# Patient Record
Sex: Female | Born: 1960 | Race: White | Hispanic: No | Smoking: Never smoker
Health system: Southern US, Community
[De-identification: ages and names within clinical notes are randomized; demographics above are authoritative.]

## PROBLEM LIST (undated history)

## (undated) DIAGNOSIS — E119 Type 2 diabetes mellitus without complications: Secondary | ICD-10-CM

## (undated) DIAGNOSIS — M67912 Unspecified disorder of synovium and tendon, left shoulder: Secondary | ICD-10-CM

## (undated) DIAGNOSIS — Z9889 Other specified postprocedural states: Secondary | ICD-10-CM

## (undated) DIAGNOSIS — E785 Hyperlipidemia, unspecified: Secondary | ICD-10-CM

## (undated) DIAGNOSIS — Z8489 Family history of other specified conditions: Secondary | ICD-10-CM

## (undated) DIAGNOSIS — H35039 Hypertensive retinopathy, unspecified eye: Secondary | ICD-10-CM

## (undated) DIAGNOSIS — H269 Unspecified cataract: Secondary | ICD-10-CM

## (undated) DIAGNOSIS — E11319 Type 2 diabetes mellitus with unspecified diabetic retinopathy without macular edema: Secondary | ICD-10-CM

## (undated) DIAGNOSIS — I10 Essential (primary) hypertension: Secondary | ICD-10-CM

## (undated) HISTORY — DX: Type 2 diabetes mellitus with unspecified diabetic retinopathy without macular edema: E11.319

## (undated) HISTORY — PX: ANKLE ARTHROSCOPY WITH OPEN REDUCTION INTERNAL FIXATION (ORIF): SHX5582

## (undated) HISTORY — DX: Hypertensive retinopathy, unspecified eye: H35.039

## (undated) HISTORY — DX: Unspecified cataract: H26.9

---

## 2017-11-06 MED FILL — CHLORTHALIDONE 25 MG TAB: 25 | 30 days supply | Qty: 30 | Fill #0

## 2017-11-06 MED FILL — busPIRone HCL 5 MG TABS: 5 | 30 days supply | Qty: 90 | Fill #0

## 2017-11-06 MED FILL — metFORMIN HCL 500 MG TABS: 500 | 30 days supply | Qty: 60 | Fill #0

## 2017-11-06 MED FILL — PRAMIPEXOLE 0.125 MG TABLET: 0.125 | 30 days supply | Qty: 30 | Fill #0

## 2017-11-06 MED FILL — raNITIdine HCL 150 MG TABS: 150 | 30 days supply | Qty: 60 | Fill #0

## 2017-11-06 MED FILL — VIT D2 1.25 MG (50,000 UNIT: 1.25 MG | 28 days supply | Qty: 4 | Fill #0

## 2017-11-06 MED FILL — ATORVASTATIN CALCIUM 40 MG: 40 | 30 days supply | Qty: 30 | Fill #0

## 2017-11-06 MED FILL — CILOSTAZOL 50 MG TABLET: 50 | 30 days supply | Qty: 60 | Fill #0

## 2017-11-06 MED FILL — GABAPENTIN 800 MG TABLET: 800 | 30 days supply | Qty: 90 | Fill #0

## 2017-11-06 MED FILL — METOPROLOL SUCCINATE ER 50: 50 | 30 days supply | Qty: 30 | Fill #0

## 2017-11-06 MED FILL — hydrOXYzine HCL 25 MG TABS: 25 | 30 days supply | Qty: 90 | Fill #0

## 2017-11-06 MED FILL — HUMALOG MIX 75-25 KWIKPEN: (75-25) 100 | 30 days supply | Qty: 3 | Fill #0

## 2017-11-06 MED FILL — PRAZOSIN 1 MG CAPSULE: 1 | 30 days supply | Qty: 30 | Fill #0

## 2017-11-06 MED FILL — lamoTRIgine 100 MG TABS: 100 | 30 days supply | Qty: 60 | Fill #0

## 2017-11-06 MED FILL — traZODone HCL 100 MG TABS: 100 | 30 days supply | Qty: 30 | Fill #0

## 2017-11-22 ENCOUNTER — Emergency Department (HOSPITAL_COMMUNITY): Payer: Medicaid Other

## 2017-11-22 ENCOUNTER — Emergency Department (HOSPITAL_COMMUNITY)
Admission: EM | Admit: 2017-11-22 | Discharge: 2017-11-22 | Disposition: A | Discharge status: Home / Self Care | Payer: Medicaid Other | Attending: Emergency Medicine | Admitting: Emergency Medicine

## 2017-11-22 ENCOUNTER — Encounter (HOSPITAL_COMMUNITY): Payer: Self-pay | Admitting: Nurse Practitioner

## 2017-11-22 DIAGNOSIS — Y939 Activity, unspecified: Secondary | ICD-10-CM | POA: Insufficient documentation

## 2017-11-22 DIAGNOSIS — S46912A Strain of unspecified muscle, fascia and tendon at shoulder and upper arm level, left arm, initial encounter: Secondary | ICD-10-CM | POA: Insufficient documentation

## 2017-11-22 DIAGNOSIS — Y929 Unspecified place or not applicable: Secondary | ICD-10-CM | POA: Diagnosis not present

## 2017-11-22 DIAGNOSIS — W16212A Fall in (into) filled bathtub causing other injury, initial encounter: Secondary | ICD-10-CM | POA: Insufficient documentation

## 2017-11-22 DIAGNOSIS — I10 Essential (primary) hypertension: Secondary | ICD-10-CM | POA: Insufficient documentation

## 2017-11-22 DIAGNOSIS — E119 Type 2 diabetes mellitus without complications: Secondary | ICD-10-CM | POA: Insufficient documentation

## 2017-11-22 DIAGNOSIS — Y999 Unspecified external cause status: Secondary | ICD-10-CM | POA: Insufficient documentation

## 2017-11-22 DIAGNOSIS — S40912A Unspecified superficial injury of left shoulder, initial encounter: Secondary | ICD-10-CM | POA: Diagnosis present

## 2017-11-22 HISTORY — DX: Essential (primary) hypertension: I10

## 2017-11-22 HISTORY — DX: Type 2 diabetes mellitus without complications: E11.9

## 2017-11-22 LAB — CBG MONITORING, ED: Glucose-Capillary: 178 mg/dL — ABNORMAL HIGH (ref 70–99)

## 2017-11-22 MED ORDER — CYCLOBENZAPRINE HCL 10 MG PO TABS: 10.0000 mg | ORAL_TABLET | Freq: Two times a day (BID) | ORAL | 0 refills | Status: DC | PRN

## 2017-11-22 MED ORDER — CYCLOBENZAPRINE HCL 10 MG PO TABS
10.0000 mg | ORAL_TABLET | Freq: Once | ORAL | Status: AC
  Administered 2017-11-22: 10 mg via ORAL
  Filled 2017-11-22: qty 1

## 2017-11-22 NOTE — ED Provider Notes (Signed)
[facility] DEPT Provider Note   CSN: [id number] Arrival date & time: 11/22/17  1554     History   Chief Complaint Chief Complaint  Patient presents with  . Neck Pain  . Shoulder Pain    HPI Jessica Mercado is a 57 y.o. female  With hx of HTN, DM and right ankle fusion who presents to the ED with neck and shoulder pain that started about 4 months ago. Patient reports that she was getting out of the bath tub and slipped and felt a tear in her left shoulder. She did not go to get it checked out. She has recently moved to [place] and the pain has gotten worse and she wanted to get it checked.   HPI  Past Medical History:  Diagnosis Date  . Diabetes mellitus without complication ([place])   . Hypertension     There are no active problems to display for this patient.   History reviewed. No pertinent surgical history.   OB History   None      Home Medications    Prior to Admission medications   Medication Sig Start Date End Date Taking? Authorizing Provider  cyclobenzaprine (FLEXERIL) 10 MG tablet Take 1 tablet (10 mg total) by mouth 2 (two) times daily as needed for muscle spasms. 11/22/17   Ashley Murrain, NP    Family History History reviewed. No pertinent family history.  Social History Social History   Tobacco Use  . Smoking status: Never Smoker  . Smokeless tobacco: Never Used  Substance Use Topics  . Alcohol use: Not Currently  . Drug use: Not Currently     Allergies   Patient has no known allergies.   Review of Systems Review of Systems  Musculoskeletal: Positive for arthralgias.       Left shoulder pain  All other systems reviewed and are negative.    Physical Exam Updated Vital Signs BP 125/78 (BP Location: Right Arm)   Pulse 87   Temp 98 F (36.7 C) (Oral)   Resp 14   LMP  (LMP Unknown)   SpO2 95%   Physical Exam  Constitutional: She appears well-developed and well-nourished. No distress.  HENT:    Head: Normocephalic.  Eyes: EOM are normal.  Neck: Neck supple. Muscular tenderness (left) present. No spinous process tenderness present. Normal range of motion present.  Cardiovascular: Normal rate.  Pulmonary/Chest: Effort normal.  Musculoskeletal:       Left shoulder: She exhibits tenderness and spasm. She exhibits no crepitus, no deformity, no laceration, normal pulse and normal strength. Decreased range of motion: due to pain.  Grips are equal, radial pulses 2+.  Neurological: She is alert.  Skin: Skin is warm and dry.  Psychiatric: She has a normal mood and affect.  Nursing note and vitals reviewed.    ED Treatments / Results  Labs (all labs ordered are listed, but only abnormal results are displayed) Labs Reviewed  CBG MONITORING, ED - Abnormal; Notable for the following components:      Result Value   Glucose-Capillary 178 (*)    All other components within normal limits   Radiology Dg Shoulder Left  Result Date: 11/22/2017 CLINICAL DATA:  Neck and shoulder pain, fell in tub EXAM: LEFT SHOULDER - 2+ VIEW COMPARISON:  None. FINDINGS: No fracture or malalignment. Small calcification adjacent to the humeral head consistent with calcific tendinopathy. AC joint is intact. Surgical changes in the cervical spine. IMPRESSION: 1. No acute osseous abnormality. 2. Calcific  tendinitis. Electronically Signed   By: Donavan Foil M.D.   On: 11/22/2017 19:53    Procedures Procedures (including critical care time)  Medications Ordered in ED Medications  cyclobenzaprine (FLEXERIL) tablet 10 mg (10 mg Oral Given 11/22/17 1937)     Initial Impression / Assessment and Plan / ED Course  I have reviewed the triage vital signs and the nursing notes. 57 y.o. female here with left shoulder pain s/p injury 4 months ago stable for d/c without fracture or dislocation noted on x-ray and no focal neuro deficits. Patient to f/u with ortho. Sling applied.  Final Clinical Impressions(s) / ED  Diagnoses   Final diagnoses:  Shoulder strain, left, initial encounter    ED Discharge Orders        Ordered    cyclobenzaprine (FLEXERIL) 10 MG tablet  2 times daily PRN     11/22/17 2004       Debroah Baller [place], [place] 11/22/17 2009    Julianne Rice, MD 11/24/17 1654

## 2017-11-22 NOTE — Discharge Instructions (Addendum)
Follow up with Dr. Griffin Basil, call for appointment

## 2017-11-22 NOTE — ED Triage Notes (Signed)
Pt is c/o neck and shoulder pain that has been ongoing for about 4 months.

## 2018-01-07 ENCOUNTER — Encounter (HOSPITAL_COMMUNITY): Payer: Self-pay

## 2018-01-07 ENCOUNTER — Emergency Department (HOSPITAL_COMMUNITY): Payer: Medicaid Other

## 2018-01-07 ENCOUNTER — Emergency Department (HOSPITAL_COMMUNITY)
Admission: EM | Admit: 2018-01-07 | Discharge: 2018-01-07 | Disposition: A | Discharge status: Home / Self Care | Payer: Medicaid Other | Attending: Emergency Medicine | Admitting: Emergency Medicine

## 2018-01-07 DIAGNOSIS — Z79899 Other long term (current) drug therapy: Secondary | ICD-10-CM | POA: Insufficient documentation

## 2018-01-07 DIAGNOSIS — R1084 Generalized abdominal pain: Secondary | ICD-10-CM | POA: Insufficient documentation

## 2018-01-07 DIAGNOSIS — Z7984 Long term (current) use of oral hypoglycemic drugs: Secondary | ICD-10-CM | POA: Insufficient documentation

## 2018-01-07 DIAGNOSIS — R109 Unspecified abdominal pain: Secondary | ICD-10-CM | POA: Diagnosis present

## 2018-01-07 DIAGNOSIS — I10 Essential (primary) hypertension: Secondary | ICD-10-CM | POA: Diagnosis not present

## 2018-01-07 DIAGNOSIS — E119 Type 2 diabetes mellitus without complications: Secondary | ICD-10-CM | POA: Insufficient documentation

## 2018-01-07 DIAGNOSIS — K529 Noninfective gastroenteritis and colitis, unspecified: Secondary | ICD-10-CM

## 2018-01-07 LAB — CBC
HCT: 39.2 % (ref 36.0–46.0)
Hemoglobin: 12.4 g/dL (ref 12.0–15.0)
MCH: 25.8 pg — ABNORMAL LOW (ref 26.0–34.0)
MCHC: 31.6 g/dL (ref 30.0–36.0)
MCV: 81.5 fL (ref 78.0–100.0)
Platelets: 252 10*3/uL (ref 150–400)
RBC: 4.81 MIL/uL (ref 3.87–5.11)
RDW: 14.4 % (ref 11.5–15.5)
WBC: 10.7 10*3/uL — ABNORMAL HIGH (ref 4.0–10.5)

## 2018-01-07 LAB — LIPASE, BLOOD: Lipase: 33 U/L (ref 11–51)

## 2018-01-07 LAB — COMPREHENSIVE METABOLIC PANEL
ALT: 15 U/L (ref 0–44)
AST: 18 U/L (ref 15–41)
Albumin: 3.6 g/dL (ref 3.5–5.0)
Alkaline Phosphatase: 98 U/L (ref 38–126)
Anion gap: 11 (ref 5–15)
BUN: 28 mg/dL — ABNORMAL HIGH (ref 6–20)
CO2: 21 mmol/L — ABNORMAL LOW (ref 22–32)
Calcium: 9.7 mg/dL (ref 8.9–10.3)
Chloride: 104 mmol/L (ref 98–111)
Creatinine, Ser: 1.43 mg/dL — ABNORMAL HIGH (ref 0.44–1.00)
GFR calc Af Amer: 47 mL/min — ABNORMAL LOW (ref >60)
GFR calc non Af Amer: 40 mL/min — ABNORMAL LOW (ref >60)
Glucose, Bld: 129 mg/dL — ABNORMAL HIGH (ref 70–99)
Potassium: 4.5 mmol/L (ref 3.5–5.1)
Sodium: 136 mmol/L (ref 135–145)
Total Bilirubin: 0.6 mg/dL (ref 0.3–1.2)
Total Protein: 8.3 g/dL — ABNORMAL HIGH (ref 6.5–8.1)

## 2018-01-07 NOTE — ED Notes (Signed)
No reply x3 for vitals

## 2018-01-07 NOTE — ED Provider Notes (Signed)
Patient placed in Quick Look pathway, seen and evaluated   Chief Complaint: abdominal pain  HPI:   Jessica Mercado is a 57 y.o. female who presents to the ED from her PCP office for evaluation for possible gallbladder problems. Patient reports RUQ abdominal pain that gets worse with eating  ROS: GI: abdominal pain, n/v  Physical Exam:  BP (!) 149/101   Pulse 84   Temp 98.9 F (37.2 C) (Oral)   Resp 18   Ht 5\' 7"  (1.702 m)   Wt 96.2 kg   LMP  (LMP Unknown)   SpO2 97%   BMI 33.20 kg/m    Gen: No distress  Neuro: Awake and Alert  Skin: rash under breast  Abdomen: soft,  Tender with palpation RUQ   Initiation of care has begun. The patient has been counseled on the process, plan, and necessity for staying for the completion/evaluation, and the remainder of the medical screening examination    Ashley Murrain, NP 01/07/18 [place]    Julianne Rice, MD 01/08/18 [phone]

## 2018-01-07 NOTE — ED Triage Notes (Signed)
Pt presents with 1 month h/o RUQ abdominal pain, black tarry stools that are runny with pt unable to control bowel movements.  +vomiting; pt also reports green mucus in stool as well.  Pt also reports red rash under R breast x 1 month; given nystatin topical which helped but after she was out of medication, rash returned.

## 2018-01-07 NOTE — ED Notes (Signed)
No reply x3

## 2018-01-07 NOTE — Discharge Instructions (Addendum)
Follow-up with your family doctor for reevaluation.  Recommend using specimen cup to obtain stool sample as we have discussed and discussed possible GI pathogen panel to evaluate for infectious causes of diarrhea.  Return to the emergency department if symptoms worsen, fevers, unable to eat or drink or other concerning symptoms.  Continue taking home medications as previously prescribed.

## 2018-01-07 NOTE — ED Provider Notes (Signed)
[facility] EMERGENCY DEPARTMENT Provider Note   CSN: [id number] Arrival date & time: 01/07/18  1508     History   Chief Complaint Chief Complaint  Patient presents with  . Abdominal Pain    HPI Jessica Mercado is a 57 y.o. female.  HPI 57 year old female with history of diabetes, hypertension, & CKD presents to the emergency department today for evaluation of abdominal pain and chronic diarrhea.  Patient reports symptoms have been present for the past month.  Recently moved here from [place] and started seeing a new primary care doctor.  Reports she has been having diarrhea that is occasionally black in color.  Often green.  Was having 3 or 4 episodes a day. Started out as watery a month ago. Her PCP started her on a stool bulking agent and has had improvement in symptoms.  States that she has been having occasional right upper quadrant pain that seems to be related to food.  Was advised by her PCP to come to this emergency department for right upper quadrant ultrasound to evaluate for possible biliary etiology/cholecystitis.  Patient denies any fevers or chills.  No blood in stool.  Occasionally dark black.  Not on blood thinners.  States she is tired of having frequent bowel movements.  States her PCP has discussed the likelihood of this being irritable bowel syndrome.  She states she has not had any infectious work-up for her diarrhea that she is aware of.  Not on antibiotics.  Past Medical History:  Diagnosis Date  . Diabetes mellitus without complication ([place])   . Hypertension     There are no active problems to display for this patient.   History reviewed. No pertinent surgical history.   OB History   None      Home Medications    Prior to Admission medications   Medication Sig Start Date End Date Taking? Authorizing Provider  atorvastatin (LIPITOR) 40 MG tablet Take 40 mg by mouth daily. 12/21/17  Yes [provider]  busPIRone (BUSPAR) 5  MG tablet Take 5 mg by mouth 3 (three) times daily. 12/21/17  Yes [provider]  chlorthalidone (HYGROTON) 25 MG tablet Take 25 mg by mouth daily. 12/29/17  Yes [provider]  cholestyramine (QUESTRAN) 4 g packet Take 1 packet by mouth 4 (four) times daily. 12/28/17  Yes [provider]  cilostazol (PLETAL) 50 MG tablet Take 50 mg by mouth 2 (two) times daily. 12/22/17  Yes [provider]  cyclobenzaprine (FLEXERIL) 10 MG tablet Take 1 tablet (10 mg total) by mouth 2 (two) times daily as needed for muscle spasms. 11/22/17  Yes Neese, [place] M, NP  dicyclomine (BENTYL) 20 MG tablet Take 20 mg by mouth 4 (four) times daily. 12/27/17  Yes [provider]  gabapentin (NEURONTIN) 800 MG tablet Take 800 mg by mouth 3 (three) times daily. 12/21/17  Yes [provider]  HUMALOG MIX 75/25 KWIKPEN (75-25) 100 UNIT/ML Kwikpen Inject 45 Units into the skin 2 (two) times daily. 12/21/17  Yes [provider]  hydrOXYzine (ATARAX/VISTARIL) 25 MG tablet Take 25 mg by mouth 3 (three) times daily. 11/06/17  Yes [provider]  LINZESS 145 MCG CAPS capsule Take 145 mcg by mouth daily. 12/27/17  Yes [provider]  metFORMIN (GLUCOPHAGE) 500 MG tablet Take 500 mg by mouth 2 (two) times daily. 12/21/17  Yes [provider]  metoprolol succinate (TOPROL-XL) 50 MG 24 hr tablet Take 50 mg by mouth daily.  12/21/17  Yes [provider]  OZEMPIC 0.25 or 0.5 MG/DOSE SOPN Inject 0.25 mg into the skin every Tuesday. 01/04/18  Yes [provider]  pramipexole (MIRAPEX) 0.125 MG tablet Take 0.125 mg by mouth every evening. 12/21/17  Yes [provider]  prazosin (MINIPRESS) 1 MG capsule Take 1 mg by mouth every evening. 12/22/17  Yes [provider]  ranitidine (ZANTAC) 150 MG capsule Take 150 mg by mouth 2 (two) times daily. 01/02/18  Yes [provider]  traZODone (DESYREL) 100 MG tablet Take 100 mg by mouth at  bedtime. 12/21/17  Yes [provider]  Vitamin D, Ergocalciferol, (DRISDOL) 50000 units CAPS capsule Take 50,000 Units by mouth every Friday. 11/06/17  Yes [provider]    Family History History reviewed. No pertinent family history.  Social History Social History   Tobacco Use  . Smoking status: Never Smoker  . Smokeless tobacco: Never Used  Substance Use Topics  . Alcohol use: Not Currently  . Drug use: Not Currently     Allergies   Vancomycin   Review of Systems Review of Systems  Constitutional: Negative for chills and fever.  HENT: Negative for congestion and sore throat.   Eyes: Negative for visual disturbance.  Respiratory: Negative for cough and shortness of breath.   Cardiovascular: Negative for chest pain and leg swelling.  Gastrointestinal: Positive for abdominal pain and diarrhea. Negative for nausea and vomiting.       Bloating, gaseous  Genitourinary: Negative for dysuria and hematuria.  Musculoskeletal: Negative for back pain and neck pain.  Skin: Negative for color change and rash.  Neurological: Negative for weakness and headaches.  All other systems reviewed and are negative.    Physical Exam Updated Vital Signs BP (!) 180/94 (BP Location: Left Arm)   Pulse 80   Temp 98.2 F (36.8 C) (Oral)   Resp 20   Ht 5\' 7"  (1.702 m)   Wt 96.2 kg   LMP  (LMP Unknown)   SpO2 99%   BMI 33.20 kg/m   Physical Exam  Constitutional: She appears well-developed and well-nourished. No distress.  HENT:  Head: Normocephalic and atraumatic.  Eyes: Conjunctivae are normal.  Neck: Neck supple.  Cardiovascular: Regular rhythm and intact distal pulses.  Pulmonary/Chest: Effort normal and breath sounds normal. No respiratory distress.  Abdominal: Soft. She exhibits no distension. There is no tenderness. There is no rigidity and no CVA tenderness. Murphy's sign: minimal tenderness.  RLE chronic deformity. No asymetric edema  Genitourinary: Rectal  exam shows guaiac negative stool.  Musculoskeletal: She exhibits no edema.  Neurological: She is alert.  Skin: Skin is warm and dry.  Psychiatric: She has a normal mood and affect.  Nursing note and vitals reviewed.   ED Treatments / Results  Labs (all labs ordered are listed, but only abnormal results are displayed) Labs Reviewed  COMPREHENSIVE METABOLIC PANEL - Abnormal; Notable for the following components:      Result Value   CO2 21 (*)    Glucose, Bld 129 (*)    BUN 28 (*)    Creatinine, Ser 1.43 (*)    Total Protein 8.3 (*)    GFR calc non Af Amer 40 (*)    GFR calc Af Amer 47 (*)    All other components within normal limits  CBC - Abnormal; Notable for the following components:   WBC 10.7 (*)    MCH 25.8 (*)    All other components within normal limits  LIPASE, BLOOD  URINALYSIS, ROUTINE W REFLEX MICROSCOPIC    EKG None  Radiology US Abdomen Complete  Result Date: 01/07/2018 CLINICAL DATA:  Right upper quadrant pain diarrhea and vomiting EXAM: ABDOMEN ULTRASOUND COMPLETE COMPARISON:  None. FINDINGS: Gallbladder: No gallstones or wall thickening visualized. Negative sonographic Murphy sign noted by sonographer. Common bile duct: Diameter: 4 mm Liver: No focal lesion identified. Within normal limits in parenchymal echogenicity. Portal vein is patent on color Doppler imaging with normal direction of blood flow towards the liver. IVC: No abnormality visualized. Pancreas: Visualized portion unremarkable. Spleen: Size and appearance within normal limits. Right Kidney: Length: 9.4 cm. Echogenicity within normal limits. No mass or hydronephrosis visualized. Left Kidney: Length: 12.4 cm. Echogenicity within normal limits. No mass or hydronephrosis visualized. Renal cyst measures 1.5 x 1.9 cm. Abdominal aorta: No aneurysm visualized. Other findings: None. IMPRESSION: Unremarkable abdominal ultrasound.  No acute findings. Electronically Signed   By: Ulyses Jarred M.D.   On: 01/07/2018  20:15    Procedures Procedures (including critical care time)  Medications Ordered in ED Medications - No data to display   Initial Impression / Assessment and Plan / ED Course  I have reviewed the triage vital signs and the nursing notes.  Pertinent labs & imaging results that were available during my care of the patient were reviewed by me and considered in my medical decision making (see chart for details).    57 year old female with history of diabetes, hypertension, & CKD presents to the emergency department today for evaluation of abdominal pain and chronic diarrhea.  Patient afebrile, hypertensive at presentation.  Well-appearing.  No distress.  Reassuring abdominal exam.  No urinary symptoms to suggest UTI/pyelonephritis.  CMP is largely unremarkable with no elevation in liver enzymes or bilirubin.  Lipase wnl. She does have WBC of 10.7 with stable hemoglobin despite reporting occasional dark stools.  No signs or symptoms of anemia.  Discussed patient's renal function with her she states she has CKD.  Does not appear dehydrated clinically at this time.  Advised to drink fluids and follow-up with her PCP for recheck of renal function.  Abdominal ultrasound obtained that shows no evidence of cholecystitis or cholelithiasis.  Abdominal aorta with no aneurysm.  No acute findings in the abdomen on complete abdominal ultrasound.  No recent antibiotics. Doubt Cdiff. Advised PCP f/u for GI path panel as unable to provide stool sample in ED. Given patient has continued to tolerate p.o, no vomiting, unremarkable work-up, well appearing, and chronic symptoms pt stable for discharge. Supportive measures discussed. Stable at DC.   Case and plan of care discussed with Dr. Zenia Resides.   Final Clinical Impressions(s) / ED Diagnoses   Final diagnoses:  Chronic diarrhea  Generalized abdominal pain    ED Discharge Orders    None       Corrie Dandy, MD 01/07/18 0086    Lacretia Leigh,  MD 01/11/18 2348

## 2018-01-07 NOTE — ED Provider Notes (Signed)
I saw and evaluated the patient, reviewed the resident's note and I agree with the findings and plan.  EKG: None 57 year old female presents with diarrhea x1 month.  Also concern for possible gallbladder pathology.  Work-up here came back okay.  Her abdominal exam is benign.  She has an appointment to see her doctor tomorrow for follow-up   Lacretia Leigh, MD 01/07/18 2211

## 2018-04-09 ENCOUNTER — Other Ambulatory Visit: Payer: Self-pay | Admitting: Neurological Surgery

## 2018-04-09 DIAGNOSIS — M542 Cervicalgia: Secondary | ICD-10-CM

## 2018-04-26 ENCOUNTER — Ambulatory Visit
Admission: RE | Admit: 2018-04-26 | Discharge: 2018-04-26 | Disposition: A | Discharge status: Home / Self Care | Payer: Medicaid Other | Source: Ambulatory Visit | Attending: Neurological Surgery | Admitting: Neurological Surgery

## 2018-04-26 DIAGNOSIS — M542 Cervicalgia: Secondary | ICD-10-CM

## 2018-06-04 ENCOUNTER — Other Ambulatory Visit: Payer: Self-pay | Admitting: Neurological Surgery

## 2018-06-04 DIAGNOSIS — M542 Cervicalgia: Secondary | ICD-10-CM

## 2018-06-14 ENCOUNTER — Other Ambulatory Visit: Payer: Medicaid Other

## 2018-06-17 ENCOUNTER — Ambulatory Visit
Admission: RE | Admit: 2018-06-17 | Discharge: 2018-06-17 | Disposition: A | Discharge status: Home / Self Care | Payer: Medicaid Other | Source: Ambulatory Visit | Attending: Neurological Surgery | Admitting: Neurological Surgery

## 2018-06-17 DIAGNOSIS — M542 Cervicalgia: Secondary | ICD-10-CM

## 2018-06-17 MED ORDER — DEXAMETHASONE SODIUM PHOSPHATE 4 MG/ML IJ SOLN
9.0000 mg | Freq: Once | INTRAMUSCULAR | Status: AC
  Administered 2018-06-17: 9.2 mg via INTRA_ARTICULAR

## 2018-06-17 NOTE — Discharge Instructions (Signed)

## 2018-08-06 ENCOUNTER — Other Ambulatory Visit: Payer: Self-pay | Admitting: Family Medicine

## 2018-08-06 DIAGNOSIS — R52 Pain, unspecified: Secondary | ICD-10-CM

## 2018-08-06 DIAGNOSIS — R609 Edema, unspecified: Secondary | ICD-10-CM

## 2018-08-11 ENCOUNTER — Other Ambulatory Visit: Payer: Medicaid Other

## 2018-09-10 ENCOUNTER — Encounter: Payer: Medicaid Other | Admitting: Obstetrics & Gynecology

## 2018-10-03 ENCOUNTER — Other Ambulatory Visit: Payer: Self-pay

## 2018-10-03 ENCOUNTER — Ambulatory Visit (HOSPITAL_COMMUNITY)
Admission: EM | Admit: 2018-10-03 | Discharge: 2018-10-03 | Disposition: A | Discharge status: Home / Self Care | Payer: No Typology Code available for payment source | Source: Ambulatory Visit | Attending: Emergency Medicine | Admitting: Emergency Medicine

## 2018-10-03 ENCOUNTER — Emergency Department (HOSPITAL_BASED_OUTPATIENT_CLINIC_OR_DEPARTMENT_OTHER)
Admission: EM | Admit: 2018-10-03 | Discharge: 2018-10-03 | Disposition: A | Discharge status: Home / Self Care | Payer: Medicaid Other | Attending: Emergency Medicine | Admitting: Emergency Medicine

## 2018-10-03 ENCOUNTER — Encounter (HOSPITAL_BASED_OUTPATIENT_CLINIC_OR_DEPARTMENT_OTHER): Payer: Self-pay | Admitting: Emergency Medicine

## 2018-10-03 DIAGNOSIS — I10 Essential (primary) hypertension: Secondary | ICD-10-CM | POA: Diagnosis not present

## 2018-10-03 DIAGNOSIS — E119 Type 2 diabetes mellitus without complications: Secondary | ICD-10-CM | POA: Diagnosis not present

## 2018-10-03 DIAGNOSIS — T7421XA Adult sexual abuse, confirmed, initial encounter: Secondary | ICD-10-CM | POA: Diagnosis not present

## 2018-10-03 DIAGNOSIS — Z0441 Encounter for examination and observation following alleged adult rape: Secondary | ICD-10-CM | POA: Insufficient documentation

## 2018-10-03 DIAGNOSIS — Z79899 Other long term (current) drug therapy: Secondary | ICD-10-CM | POA: Insufficient documentation

## 2018-10-03 DIAGNOSIS — Z794 Long term (current) use of insulin: Secondary | ICD-10-CM | POA: Diagnosis not present

## 2018-10-03 DIAGNOSIS — T7621XA Adult sexual abuse, suspected, initial encounter: Secondary | ICD-10-CM | POA: Diagnosis present

## 2018-10-03 LAB — RAPID HIV SCREEN (HIV 1/2 AB+AG)
HIV 1/2 Antibodies: NONREACTIVE
HIV-1 P24 Antigen - HIV24: NONREACTIVE

## 2018-10-03 MED ORDER — LIDOCAINE HCL (PF) 1 % IJ SOLN
0.9000 mL | Freq: Once | INTRAMUSCULAR | Status: AC
  Administered 2018-10-03: 0.9 mL
  Filled 2018-10-03: qty 5

## 2018-10-03 MED ORDER — AZITHROMYCIN 250 MG PO TABS
1000.0000 mg | ORAL_TABLET | Freq: Once | ORAL | Status: AC
  Administered 2018-10-03: 1000 mg via ORAL
  Filled 2018-10-03: qty 4

## 2018-10-03 MED ORDER — CEFTRIAXONE SODIUM 250 MG IJ SOLR
250.0000 mg | Freq: Once | INTRAMUSCULAR | Status: AC
  Administered 2018-10-03: 09:00:00 250 mg via INTRAMUSCULAR
  Filled 2018-10-03: qty 250

## 2018-10-03 MED ORDER — METRONIDAZOLE 500 MG PO TABS
2000.0000 mg | ORAL_TABLET | Freq: Once | ORAL | Status: AC
  Administered 2018-10-03: 2000 mg via ORAL
  Filled 2018-10-03: qty 4

## 2018-10-03 MED ORDER — PROMETHAZINE HCL 25 MG PO TABS
25.0000 mg | ORAL_TABLET | Freq: Four times a day (QID) | ORAL | Status: DC | PRN
  Filled 2018-10-03: qty 1
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
## 2018-10-03 NOTE — ED Notes (Signed)
ED Provider at bedside. 
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
## 2018-10-03 NOTE — ED Notes (Signed)
Amy RN at bedside for SANE exam

## 2018-10-03 NOTE — ED Notes (Signed)
SANE Exam, still in process
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
## 2018-10-03 NOTE — ED Notes (Signed)
Amy , SANE RN in to see
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
## 2018-10-11 ENCOUNTER — Ambulatory Visit: Payer: Medicaid Other

## 2018-11-24 ENCOUNTER — Other Ambulatory Visit: Payer: Self-pay | Admitting: Family Medicine

## 2018-11-24 DIAGNOSIS — M79605 Pain in left leg: Secondary | ICD-10-CM

## 2018-11-30 ENCOUNTER — Other Ambulatory Visit: Payer: Self-pay | Admitting: Family Medicine

## 2018-12-03 ENCOUNTER — Other Ambulatory Visit: Payer: Medicaid Other

## 2018-12-23 ENCOUNTER — Other Ambulatory Visit: Payer: Self-pay | Admitting: Family Medicine

## 2018-12-23 DIAGNOSIS — Z1231 Encounter for screening mammogram for malignant neoplasm of breast: Secondary | ICD-10-CM

## 2019-01-05 ENCOUNTER — Ambulatory Visit: Payer: Medicaid Other

## 2019-01-27 ENCOUNTER — Emergency Department (HOSPITAL_COMMUNITY)
Admission: EM | Admit: 2019-01-27 | Discharge: 2019-01-27 | Disposition: A | Discharge status: Home / Self Care | Payer: Medicaid Other | Attending: Emergency Medicine | Admitting: Emergency Medicine

## 2019-01-27 ENCOUNTER — Other Ambulatory Visit: Payer: Self-pay

## 2019-01-27 ENCOUNTER — Emergency Department (HOSPITAL_COMMUNITY): Payer: Medicaid Other

## 2019-01-27 DIAGNOSIS — E119 Type 2 diabetes mellitus without complications: Secondary | ICD-10-CM | POA: Diagnosis not present

## 2019-01-27 DIAGNOSIS — Y999 Unspecified external cause status: Secondary | ICD-10-CM | POA: Diagnosis not present

## 2019-01-27 DIAGNOSIS — Y929 Unspecified place or not applicable: Secondary | ICD-10-CM | POA: Diagnosis not present

## 2019-01-27 DIAGNOSIS — Y939 Activity, unspecified: Secondary | ICD-10-CM | POA: Diagnosis not present

## 2019-01-27 DIAGNOSIS — W010XXA Fall on same level from slipping, tripping and stumbling without subsequent striking against object, initial encounter: Secondary | ICD-10-CM | POA: Diagnosis not present

## 2019-01-27 DIAGNOSIS — Z7984 Long term (current) use of oral hypoglycemic drugs: Secondary | ICD-10-CM | POA: Insufficient documentation

## 2019-01-27 DIAGNOSIS — I10 Essential (primary) hypertension: Secondary | ICD-10-CM | POA: Diagnosis not present

## 2019-01-27 DIAGNOSIS — M25512 Pain in left shoulder: Secondary | ICD-10-CM

## 2019-01-27 DIAGNOSIS — Z79899 Other long term (current) drug therapy: Secondary | ICD-10-CM | POA: Insufficient documentation

## 2019-01-27 MED ORDER — TRAMADOL HCL 50 MG PO TABS: 100.0000 mg | ORAL_TABLET | Freq: Once | ORAL | Status: DC

## 2019-01-27 MED ORDER — TRAMADOL HCL 50 MG PO TABS
100.0000 mg | ORAL_TABLET | Freq: Once | ORAL | Status: DC
  Administered 2019-01-27: 20:00:00 100 mg via ORAL
  Filled 2019-01-27: qty 2

## 2019-01-27 MED ORDER — CYCLOBENZAPRINE HCL 10 MG PO TABS: 10.0000 mg | ORAL_TABLET | Freq: Once | ORAL | Status: DC

## 2019-01-27 MED ORDER — HYDROCODONE-ACETAMINOPHEN 5-325 MG PO TABS: 1.0000 | ORAL_TABLET | Freq: Four times a day (QID) | ORAL | 0 refills | Status: DC | PRN

## 2019-01-27 MED ORDER — IBUPROFEN 400 MG PO TABS
600.0000 mg | ORAL_TABLET | Freq: Once | ORAL | Status: DC
  Administered 2019-01-27: 600 mg via ORAL
  Filled 2019-01-27: qty 1

## 2019-01-27 NOTE — ED Provider Notes (Signed)
[facility] Provider Note   CSN: [id number] Arrival date & time: 01/27/19  1537     History   Chief Complaint Chief Complaint  Patient presents with  . Fall  . Dizziness    HPI Jessica Mercado is a 58 y.o. female. Is a 58 year old female with diabetes and hypertension presenting today after a fall that occurred at 1230 this afternoon.  Patient states that her left shoulder hurts.  Patient states she was hit by a car door that was open behind her.  Patient states this pushed her to the ground where she hurt her shoulder, and scraped her knees and face.  Patient denies loss of consciousness, dizziness, or palpitations.      HPI  Past Medical History:  Diagnosis Date  . Diabetes mellitus without complication ([place])   . Hypertension     There are no active problems to display for this patient.   Past Surgical History:  Procedure Laterality Date  . ANKLE ARTHROSCOPY WITH OPEN REDUCTION INTERNAL FIXATION (ORIF)       OB History   No obstetric history on file.      Home Medications    Prior to Admission medications   Medication Sig Start Date End Date Taking? Authorizing Provider  atorvastatin (LIPITOR) 40 MG tablet Take 40 mg by mouth daily. 12/21/17   [provider]  busPIRone (BUSPAR) 5 MG tablet Take 5 mg by mouth 3 (three) times daily. 12/21/17   [provider]  chlorthalidone (HYGROTON) 25 MG tablet Take 25 mg by mouth daily. 12/29/17   [provider]  cholestyramine (QUESTRAN) 4 g packet Take 1 packet by mouth 4 (four) times daily. 12/28/17   [provider]  cilostazol (PLETAL) 50 MG tablet Take 50 mg by mouth 2 (two) times daily. 12/22/17   [provider]  cyclobenzaprine (FLEXERIL) 10 MG tablet Take 1 tablet (10 mg total) by mouth 2 (two) times daily as needed for muscle spasms. 11/22/17   Ashley Murrain, NP  dicyclomine (BENTYL) 20 MG tablet Take 20 mg by mouth 4 (four) times daily.  12/27/17   [provider]  FLUoxetine (PROZAC) 40 MG capsule Take 40 mg by mouth every morning. 09/04/18   [provider]  gabapentin (NEURONTIN) 800 MG tablet Take 800 mg by mouth 3 (three) times daily. 12/21/17   [provider]  glipiZIDE (GLUCOTROL) 5 MG tablet TAKE 1 TABLET BY MOUTH TWICE A DAY BEFORE MEALS 09/04/18   [provider]  HUMALOG MIX 75/25 KWIKPEN (75-25) 100 UNIT/ML Kwikpen Inject 45 Units into the skin 2 (two) times daily. 12/21/17   [provider]  hydrOXYzine (ATARAX/VISTARIL) 25 MG tablet Take 25 mg by mouth 3 (three) times daily. 11/06/17   [provider]  lamoTRIgine (LAMICTAL) 100 MG tablet Take 100 mg by mouth 2 (two) times daily. 09/21/18   [provider]  LINZESS 145 MCG CAPS capsule Take 145 mcg by mouth daily. 12/27/17   [provider]  metFORMIN (GLUCOPHAGE) 500 MG tablet Take 500 mg by mouth 2 (two) times daily. 12/21/17   [provider]  metoprolol succinate (TOPROL-XL) 50 MG 24 hr tablet Take 50 mg by mouth daily. 12/21/17   [provider]  OZEMPIC 0.25 or 0.5 MG/DOSE SOPN Inject 0.25 mg into the skin every Tuesday. 01/04/18   [provider]  pantoprazole (PROTONIX) 40 MG tablet Take 40 mg by mouth daily. 07/21/18   [provider]  pramipexole (MIRAPEX) 0.125 MG tablet Take 0.125 mg by mouth every evening. 12/21/17   [provider]  prazosin (MINIPRESS) 1 MG capsule Take 1 mg by mouth every evening. 12/22/17   [provider]  ranitidine (ZANTAC) 150 MG capsule Take 150 mg by mouth 2 (two) times daily. 01/02/18   [provider]  torsemide (DEMADEX) 20 MG tablet Take 20 mg by mouth 2 (two) times daily. 09/04/18   [provider]  traZODone (DESYREL) 100 MG tablet Take 100 mg by mouth at bedtime. 12/21/17   [provider]  Vitamin D, Ergocalciferol, (DRISDOL) 50000 units CAPS capsule Take 50,000 Units by mouth every  Friday. 11/06/17   [provider]    Family History No family history on file.  Social History Social History   Tobacco Use  . Smoking status: Never Smoker  . Smokeless tobacco: Never Used  Substance Use Topics  . Alcohol use: Not Currently  . Drug use: Not Currently     Allergies   Vancomycin   Review of Systems Review of Systems  Constitutional: Negative for chills and fever.  HENT: Negative for congestion, hearing loss, sinus pressure and sore throat.   Eyes: Negative for pain and redness.  Respiratory: Negative for cough and shortness of breath.   Cardiovascular: Negative for chest pain and palpitations.  Gastrointestinal: Negative for abdominal pain, diarrhea, nausea and vomiting.  Genitourinary: Negative for dysuria and hematuria.  Musculoskeletal: Negative for myalgias.  Skin: Negative for rash.  Neurological: Negative for dizziness and headaches.     Physical Exam Updated Vital Signs BP 111/70 (BP Location: Right Arm)   Pulse 86   Temp 99.3 F (37.4 C) (Oral)   Resp 16   LMP  (LMP Unknown)   SpO2 94%   Physical Exam Constitutional:      General: She is not in acute distress. HENT:     Head: Normocephalic.     Comments: Small, superficial abrasions over the mid forehead and between eyebrows.    Nose: Nose normal.  Eyes:     General:        Right eye: No discharge.        Left eye: No discharge.     Extraocular Movements: Extraocular movements intact.  Neck:     Musculoskeletal: Normal range of motion.  Cardiovascular:     Rate and Rhythm: Normal rate and regular rhythm.     Heart sounds: No murmur. No friction rub. No gallop.   Pulmonary:     Effort: Pulmonary effort is normal. No respiratory distress.     Breath sounds: No stridor. No wheezing or rhonchi.  Abdominal:     General: Bowel sounds are normal.     Palpations: Abdomen is soft.     Tenderness: There is no abdominal tenderness. There is no guarding.  Musculoskeletal:      Right lower leg: No edema.     Left lower leg: No edema.     Comments: Patient is exquisitely tender over the left deltoid and anterior shoulder.  Patient has passive range of motion 90 degrees but is limited secondary to pain.  Active range of motion is limited entirely by pain.  Patient is holding left arm in lap.   Full ROM of knees and hips, neck. Ankle ROM is limited due to old surgery.  No tenderness over the skull, right shoulder, hips, spine, elbows, knees, or feet.   Skin:    General: Skin is warm and dry.  Comments: Abrasions superficial over left and right knee.  Neurological:     Mental Status: She is alert and oriented to person, place, and time. Mental status is at baseline.     Sensory: No sensory deficit.  Psychiatric:        Mood and Affect: Mood normal.        Behavior: Behavior normal.      ED Treatments / Results  Labs (all labs ordered are listed, but only abnormal results are displayed) Labs Reviewed - No data to display  EKG None  Radiology Dg Shoulder Left  Result Date: 01/27/2019 CLINICAL DATA:  58 year old who fell earlier today and injured the LEFT shoulder. Initial encounter. EXAM: LEFT SHOULDER - 2+ VIEW COMPARISON:  11/22/2017. FINDINGS: No evidence of acute fracture. Glenohumeral joint anatomically aligned with a well-preserved joint space. Chronic irregularity involving the INFERIOR glenoid is unchanged. Acromioclavicular joint anatomically aligned without degenerative changes. Subacromial space well-preserved. Calcific supraspinatus tendinitis present on the prior examination is no longer visible. IMPRESSION: No acute osseous abnormality. Electronically Signed   By: Evangeline Dakin M.D.   On: 01/27/2019 16:26    Procedures Procedures (including critical care time)  Medications Ordered in ED Medications - No data to display   Initial Impression / Assessment and Plan / ED Course  I have reviewed the triage vital signs and the nursing notes.   Pertinent labs & imaging results that were available during my care of the patient were reviewed by me and considered in my medical decision making (see chart for details).    Patient presents for left shoulder pain after falling to the ground at 1230 today.  Shoulder x-ray shows chronic calcification of the biceps tendon and no acute fractures or dislocation.  Patient will be placed in a shoulder sling given pain medication, advised on shoulder stretches, and discharged with follow-up to orthopedist.  Patient is understanding of the plan and willing to follow-up.  Given tramadol and ibuprofen during ED visit and discharged with 6 tablets of Norco for pain control until she is able to follow-up with orthopedics.      Final Clinical Impressions(s) / ED Diagnoses   Final diagnoses:  None    ED Discharge Orders    None       Tedd Sias, [place] 01/27/19 1946    Charlesetta Shanks, MD 01/29/19 1642

## 2019-01-27 NOTE — Discharge Instructions (Signed)
Follow-up with orthopedist.  Use tramadol for pain control as well as ibuprofen as discussed.  Keep abrasions on knees and forehead clean and dry.  Please review the information on shoulder range of motion exercises.  Use arm sling sparingly and move your shoulder regularly as discussed.

## 2019-01-27 NOTE — ED Triage Notes (Signed)
Per pt today she fell face first into the cement and fell on her left shoulder as well. Does have an obvious facial laceration to his nose and on her forehead. Pt does have a sore to her right knee and left knee.  Pt is a diabetic.

## 2019-01-27 NOTE — ED Notes (Signed)
Patient verbalizes understanding of discharge instructions. Opportunity for questioning and answers were provided. Armband removed by staff, pt discharged from ED.  

## 2019-03-31 ENCOUNTER — Ambulatory Visit (HOSPITAL_COMMUNITY): Admission: RE | Admit: 2019-03-31 | Payer: Medicaid Other | Source: Ambulatory Visit

## 2019-03-31 ENCOUNTER — Other Ambulatory Visit (HOSPITAL_COMMUNITY): Payer: Self-pay | Admitting: Family Medicine

## 2019-03-31 DIAGNOSIS — M79669 Pain in unspecified lower leg: Secondary | ICD-10-CM

## 2019-04-01 ENCOUNTER — Ambulatory Visit (HOSPITAL_COMMUNITY)
Admission: RE | Admit: 2019-04-01 | Discharge: 2019-04-01 | Disposition: A | Discharge status: Home / Self Care | Payer: Medicaid Other | Source: Ambulatory Visit | Attending: Internal Medicine | Admitting: Internal Medicine

## 2019-04-01 ENCOUNTER — Other Ambulatory Visit: Payer: Self-pay

## 2019-04-01 DIAGNOSIS — M79669 Pain in unspecified lower leg: Secondary | ICD-10-CM | POA: Diagnosis present

## 2019-04-01 DIAGNOSIS — M79662 Pain in left lower leg: Secondary | ICD-10-CM | POA: Diagnosis not present

## 2019-04-01 DIAGNOSIS — M7989 Other specified soft tissue disorders: Secondary | ICD-10-CM | POA: Insufficient documentation

## 2019-06-09 ENCOUNTER — Encounter: Payer: Medicaid Other | Admitting: Obstetrics & Gynecology

## 2019-06-09 ENCOUNTER — Encounter: Payer: Medicaid Other | Admitting: Obstetrics and Gynecology

## 2019-06-14 ENCOUNTER — Encounter: Payer: Self-pay | Admitting: *Deleted

## 2019-09-05 ENCOUNTER — Other Ambulatory Visit: Payer: Self-pay

## 2019-09-05 ENCOUNTER — Ambulatory Visit: Payer: Medicaid Other | Admitting: Family Medicine

## 2019-09-05 ENCOUNTER — Ambulatory Visit: Payer: Self-pay

## 2019-09-05 VITALS — BP 162/92 | Ht 67.0 in | Wt 230.0 lb

## 2019-09-05 DIAGNOSIS — M25512 Pain in left shoulder: Secondary | ICD-10-CM | POA: Diagnosis not present

## 2019-09-05 DIAGNOSIS — G8929 Other chronic pain: Secondary | ICD-10-CM

## 2019-09-05 MED ORDER — NITROGLYCERIN 0.2 MG/HR TD PT24: MEDICATED_PATCH | TRANSDERMAL | 1 refills | Status: DC

## 2019-09-05 NOTE — Patient Instructions (Signed)
You have severe rotator cuff tendinopathy with partial tearing. Start physical therapy and do home exercises on days you don't go to therapy. Nitro patches 1/4th patch to affected shoulder, change daily. Ok to take tylenol, aleve as needed in addition to this. Follow up with me in 6 weeks for reevaluation. This is going to take several months to improve and given how long this has been going on, it's likely you won't regain full motion and strength of the shoulder.

## 2019-09-05 NOTE — Progress Notes (Addendum)
PCP: Vassie Moment, MD  Subjective:   HPI: Patient is a 59 y.o. female here for evaluation of left shoulder pain.  Patient has 2 year history of right shoulder pain after she was holding onto a bar to lower herself into the tub and her shoulder was pushed up in its socket and she felt a tearing sensation. She had Xray at the time and had no bony abnormality. She then injured it again about a year later doing the same thing and Xray was negative as well. Her pain is diffuse in the shoulder joint and she has limited mobility due to pain. She keeps her left arm at her side most of the time and avoids using it for daily tasks.   Past Medical History:  Diagnosis Date  . Diabetes mellitus without complication ([place])   . Hypertension    BP (!) 162/92   Ht 5\' 7"  (1.702 m)   Wt 230 lb (104.3 kg)   LMP  (LMP Unknown)   BMI 36.02 kg/m   Review of Systems: See HPI above.     Objective:  Physical Exam:  Gen: NAD, comfortable in exam room  Left shoulder: No ecchymosis, erythema or masses, small pox vaccine scar noted TTP over [facility] joint and lateral shoulder diffusely Active Range of motion limited: Internal rotation: 90 deg, External rotation: 50 deg, Abduction: 90 deg, Flexion: 90 (120 deg passive ROM) Extension: 40 deg Strength 5/5 with all but abduction 3+/5 Negative Hawkins Crossover adduction positive Neers test pos Supraspinatus/Empty can pos Subscapularis lift off neg  Right shoulder: No swelling, ecchymoses.  No gross deformity. No TTP. Active and passive FROM. Internal rotation: 90 deg, External rotation: 50 deg, Abduction: 150 deg, Flexion: 150 Extension: 40 deg Negative Hawkins, Neers. Negative Yergasons. Strength 5/5 with empty can and resisted internal/external rotation. Negative apprehension. NV intact distally.  Complete shoulder u/s - limited due to pain: Chronic biceps tendinopathy with moderate tenosynovitis Pec major tendon intact Subscapularis hypoechoic and  thickened Infraspinatus very thin and hypoechoic Supraspinatus very hypoechoic with superior riding of humeral head AC joint without effusion.   Assessment & Plan:  1. Rotator cuff tendinopathy: Patient has 2 year history of shoulder pain after an injury which was subsequently repeated a year later. She has positive crossover adduction, empty can tests and decreased supraspinatus strength. Ultrasound findings concerning for multi-tendon involvement of rotator cuff injury. -- Rotator cuff physical therapy -- Nitro patches daily on shoulder -- tylenol, aleve if needed -- F/u in 6 weeks

## 2019-09-06 ENCOUNTER — Encounter: Payer: Self-pay | Admitting: Family Medicine

## 2019-10-17 ENCOUNTER — Other Ambulatory Visit: Payer: Self-pay

## 2019-10-17 ENCOUNTER — Encounter: Payer: Self-pay | Admitting: Family Medicine

## 2019-10-17 ENCOUNTER — Ambulatory Visit (INDEPENDENT_AMBULATORY_CARE_PROVIDER_SITE_OTHER): Payer: Medicaid Other | Admitting: Family Medicine

## 2019-10-17 VITALS — BP 151/74 | Ht 67.0 in | Wt 219.0 lb

## 2019-10-17 DIAGNOSIS — G8929 Other chronic pain: Secondary | ICD-10-CM

## 2019-10-17 DIAGNOSIS — M25512 Pain in left shoulder: Secondary | ICD-10-CM | POA: Diagnosis not present

## 2019-10-17 MED ORDER — METHYLPREDNISOLONE ACETATE 40 MG/ML IJ SUSP
40.0000 mg | Freq: Once | INTRAMUSCULAR | Status: AC
  Administered 2019-10-17: 40 mg via INTRA_ARTICULAR

## 2019-10-17 NOTE — Patient Instructions (Signed)
You have severe rotator cuff tendinopathy with partial tearing. Start physical therapy and do home exercises on days you don't go to therapy - call us by the end of the week if you haven't heard from them. Stop the nitro patches. You were given an injection today. Ok to take tylenol, aleve as needed in addition to this. Follow up with me in 6 weeks for reevaluation.

## 2019-10-17 NOTE — Addendum Note (Signed)
Addended by: Jolinda Croak E on: 10/17/2019 03:06 PM   Modules accepted: Orders

## 2019-10-17 NOTE — Progress Notes (Signed)
PCP: Vassie Moment, MD  Subjective:   HPI: Patient is a 59 y.o. female here for left shoulder pain.  4/12: Patient has 2 year history of right shoulder pain after she was holding onto a bar to lower herself into the tub and her shoulder was pushed up in its socket and she felt a tearing sensation. She had Xray at the time and had no bony abnormality. She then injured it again about a year later doing the same thing and Xray was negative as well. Her pain is diffuse in the shoulder joint and she has limited mobility due to pain. She keeps her left arm at her side most of the time and avoids using it for daily tasks.   5/24: Patient reports she feels about the same compared to last visit. Used nitro patches but didn't notice a benefit to these. Doing home exercises but did not start physical therapy. Pain level 8/10 and sharp, worse with lying on left side.  Past Medical History:  Diagnosis Date  . Diabetes mellitus without complication ([place])   . Hypertension     Current Outpatient Medications on File Prior to Visit  Medication Sig Dispense Refill  . atorvastatin (LIPITOR) 40 MG tablet Take 40 mg by mouth daily.  3  . busPIRone (BUSPAR) 5 MG tablet Take 5 mg by mouth 3 (three) times daily.  3  . chlorthalidone (HYGROTON) 25 MG tablet Take 25 mg by mouth daily.  3  . cholestyramine (QUESTRAN) 4 g packet Take 1 packet by mouth 4 (four) times daily.  3  . cilostazol (PLETAL) 50 MG tablet Take 50 mg by mouth 2 (two) times daily.  3  . cyclobenzaprine (FLEXERIL) 10 MG tablet Take 1 tablet (10 mg total) by mouth 2 (two) times daily as needed for muscle spasms. 20 tablet 0  . dicyclomine (BENTYL) 20 MG tablet Take 20 mg by mouth 4 (four) times daily.  3  . FLUoxetine (PROZAC) 40 MG capsule Take 40 mg by mouth every morning.    . gabapentin (NEURONTIN) 800 MG tablet Take 800 mg by mouth 3 (three) times daily.  3  . glipiZIDE (GLUCOTROL) 5 MG tablet TAKE 1 TABLET BY MOUTH TWICE A DAY BEFORE  MEALS    . HUMALOG MIX 75/25 KWIKPEN (75-25) 100 UNIT/ML Kwikpen Inject 45 Units into the skin 2 (two) times daily.  3  . HYDROcodone-acetaminophen (NORCO/VICODIN) 5-325 MG tablet Take 1 tablet by mouth every 6 (six) hours as needed. 6 tablet 0  . hydrOXYzine (ATARAX/VISTARIL) 25 MG tablet Take 25 mg by mouth 3 (three) times daily.  0  . lamoTRIgine (LAMICTAL) 100 MG tablet Take 100 mg by mouth 2 (two) times daily.    Marland Kitchen LINZESS 145 MCG CAPS capsule Take 145 mcg by mouth daily.  3  . metFORMIN (GLUCOPHAGE) 500 MG tablet Take 500 mg by mouth 2 (two) times daily.  3  . metoprolol succinate (TOPROL-XL) 50 MG 24 hr tablet Take 50 mg by mouth daily.  3  . nitroGLYCERIN (NITRODUR - DOSED IN MG/24 HR) 0.2 mg/hr patch Apply 1/4th patch to affected shoulder, change daily 30 patch 1  . OZEMPIC 0.25 or 0.5 MG/DOSE SOPN Inject 0.25 mg into the skin every Tuesday.  3  . pantoprazole (PROTONIX) 40 MG tablet Take 40 mg by mouth daily.    . pramipexole (MIRAPEX) 0.125 MG tablet Take 0.125 mg by mouth every evening.  3  . prazosin (MINIPRESS) 1 MG capsule Take 1 mg by mouth  every evening.  3  . ranitidine (ZANTAC) 150 MG capsule Take 150 mg by mouth 2 (two) times daily.  3  . torsemide (DEMADEX) 20 MG tablet Take 20 mg by mouth 2 (two) times daily.    . traZODone (DESYREL) 100 MG tablet Take 100 mg by mouth at bedtime.  3  . Vitamin D, Ergocalciferol, (DRISDOL) 50000 units CAPS capsule Take 50,000 Units by mouth every Friday.  0   No current facility-administered medications on file prior to visit.    Past Surgical History:  Procedure Laterality Date  . ANKLE ARTHROSCOPY WITH OPEN REDUCTION INTERNAL FIXATION (ORIF)      Allergies  Allergen Reactions  . Vancomycin Other (See Comments)    Red man syndrome    Social History   Socioeconomic History  . Marital status: Divorced    Spouse name: Not on file  . Number of children: Not on file  . Years of education: Not on file  . Highest education level:  Not on file  Occupational History  . Not on file  Tobacco Use  . Smoking status: Never Smoker  . Smokeless tobacco: Never Used  Substance and Sexual Activity  . Alcohol use: Not Currently  . Drug use: Not Currently  . Sexual activity: Not Currently  Other Topics Concern  . Not on file  Social History Narrative  . Not on file   Social Determinants of Health   Financial Resource Strain:   . Difficulty of Paying Living Expenses:   Food Insecurity:   . Worried About Charity fundraiser in the Last Year:   . Arboriculturist in the Last Year:   Transportation Needs:   . Film/video editor (Medical):   Marland Kitchen Lack of Transportation (Non-Medical):   Physical Activity:   . Days of Exercise per Week:   . Minutes of Exercise per Session:   Stress:   . Feeling of Stress :   Social Connections:   . Frequency of Communication with Friends and Family:   . Frequency of Social Gatherings with Friends and Family:   . Attends Religious Services:   . Active Member of Clubs or Organizations:   . Attends Archivist Meetings:   Marland Kitchen Marital Status:   Intimate Partner Violence:   . Fear of Current or Ex-Partner:   . Emotionally Abused:   Marland Kitchen Physically Abused:   . Sexually Abused:     History reviewed. No pertinent family history.  BP (!) 151/74   Ht 5\' 7"  (1.702 m)   Wt 219 lb (99.3 kg)   LMP  (LMP Unknown)   BMI 34.30 kg/m   Review of Systems: See HPI above.     Objective:  Physical Exam:  Gen: NAD, comfortable in exam room  Left shoulder: No swelling, ecchymoses.  No gross deformity. No TTP AC, biceps tendon. Full passive motion.  Full active IR and ER.  Flexion and abduction to 120 degrees actively. Positive Hawkins, Neers. Negative Yergasons. Strength 5-/5 with empty can and resisted internal/external rotation.  Pain empty can. NV intact distally.   Assessment & Plan:  1. Left shoulder pain - 2/2 severe rotator cuff tendinopathy with partial tearing.  No  benefit with nitro patches, home exercises.  She was referred for PT, they left message but patient had not returned call - will refer again and advised to call us if they have trouble connecting by end of this week.  Given level of pain went ahead with subacromial  injection today.  Tylenol, aleve if needed.  F/u in 6 weeks.  After informed written consent timeout was performed, patient was seated in chair in exam room. Left shoulder was prepped with alcohol swab and utilizing lateral approach with ultrasound guidance, patient's left subacromial space was injected with 3:1 bupivicaine: depomedrol. Patient tolerated the procedure well without immediate complications.

## 2019-11-02 ENCOUNTER — Ambulatory Visit (HOSPITAL_COMMUNITY): Payer: Medicaid Other | Admitting: Licensed Clinical Social Worker

## 2019-11-08 ENCOUNTER — Other Ambulatory Visit: Payer: Self-pay | Admitting: *Deleted

## 2019-11-08 DIAGNOSIS — I739 Peripheral vascular disease, unspecified: Secondary | ICD-10-CM

## 2019-11-10 ENCOUNTER — Ambulatory Visit (HOSPITAL_COMMUNITY): Payer: Medicaid Other | Admitting: Psychiatry

## 2019-11-15 ENCOUNTER — Encounter: Payer: Medicaid Other | Admitting: Vascular Surgery

## 2019-11-15 ENCOUNTER — Ambulatory Visit (HOSPITAL_COMMUNITY): Payer: Medicaid Other

## 2019-11-30 ENCOUNTER — Ambulatory Visit: Payer: Medicaid Other | Admitting: Family Medicine

## 2019-12-05 ENCOUNTER — Encounter: Payer: Self-pay | Admitting: Family Medicine

## 2019-12-05 ENCOUNTER — Other Ambulatory Visit: Payer: Self-pay

## 2019-12-05 ENCOUNTER — Ambulatory Visit (INDEPENDENT_AMBULATORY_CARE_PROVIDER_SITE_OTHER): Payer: Medicaid Other | Admitting: Family Medicine

## 2019-12-05 VITALS — BP 153/85 | Ht 67.0 in | Wt 219.0 lb

## 2019-12-05 DIAGNOSIS — M25512 Pain in left shoulder: Secondary | ICD-10-CM

## 2019-12-05 DIAGNOSIS — G8929 Other chronic pain: Secondary | ICD-10-CM | POA: Diagnosis not present

## 2019-12-05 NOTE — Progress Notes (Signed)
PCP: Vassie Moment, MD  Subjective:   HPI: Patient is a 59 y.o. female here for left shoulder pain.  4/12: Patient has 2 year history of right shoulder pain after she was holding onto a bar to lower herself into the tub and her shoulder was pushed up in its socket and she felt a tearing sensation. She had Xray at the time and had no bony abnormality. She then injured it again about a year later doing the same thing and Xray was negative as well. Her pain is diffuse in the shoulder joint and she has limited mobility due to pain. She keeps her left arm at her side most of the time and avoids using it for daily tasks.   5/24: Patient reports she feels about the same compared to last visit. Used nitro patches but didn't notice a benefit to these. Doing home exercises but did not start physical therapy. Pain level 8/10 and sharp, worse with lying on left side.  7/12: Patient reports she's doing well. Pain level down to 4/10 since injection, motion some improved. No night pain. Doing home exercises but has not heard from physical therapy.  Past Medical History:  Diagnosis Date  . Diabetes mellitus without complication ([place])   . Hypertension     Current Outpatient Medications on File Prior to Visit  Medication Sig Dispense Refill  . atorvastatin (LIPITOR) 40 MG tablet Take 40 mg by mouth daily.  3  . busPIRone (BUSPAR) 5 MG tablet Take 5 mg by mouth 3 (three) times daily.  3  . chlorthalidone (HYGROTON) 25 MG tablet Take 25 mg by mouth daily.  3  . cholestyramine (QUESTRAN) 4 g packet Take 1 packet by mouth 4 (four) times daily.  3  . cilostazol (PLETAL) 50 MG tablet Take 50 mg by mouth 2 (two) times daily.  3  . cyclobenzaprine (FLEXERIL) 10 MG tablet Take 1 tablet (10 mg total) by mouth 2 (two) times daily as needed for muscle spasms. 20 tablet 0  . dicyclomine (BENTYL) 20 MG tablet Take 20 mg by mouth 4 (four) times daily.  3  . FLUoxetine (PROZAC) 40 MG capsule Take 40 mg by mouth  every morning.    . gabapentin (NEURONTIN) 800 MG tablet Take 800 mg by mouth 3 (three) times daily.  3  . glipiZIDE (GLUCOTROL) 5 MG tablet TAKE 1 TABLET BY MOUTH TWICE A DAY BEFORE MEALS    . HUMALOG MIX 75/25 KWIKPEN (75-25) 100 UNIT/ML Kwikpen Inject 45 Units into the skin 2 (two) times daily.  3  . hydrOXYzine (ATARAX/VISTARIL) 25 MG tablet Take 25 mg by mouth 3 (three) times daily.  0  . lamoTRIgine (LAMICTAL) 100 MG tablet Take 100 mg by mouth 2 (two) times daily.    Marland Kitchen LINZESS 145 MCG CAPS capsule Take 145 mcg by mouth daily.  3  . metFORMIN (GLUCOPHAGE) 500 MG tablet Take 500 mg by mouth 2 (two) times daily.  3  . metoprolol succinate (TOPROL-XL) 50 MG 24 hr tablet Take 50 mg by mouth daily.  3  . nitroGLYCERIN (NITRODUR - DOSED IN MG/24 HR) 0.2 mg/hr patch Apply 1/4th patch to affected shoulder, change daily 30 patch 1  . OZEMPIC 0.25 or 0.5 MG/DOSE SOPN Inject 0.25 mg into the skin every Tuesday.  3  . pantoprazole (PROTONIX) 20 MG tablet TAKE 1 TABLET BY MOUTH EVERY DAY IN THE MORNING    . pramipexole (MIRAPEX) 0.125 MG tablet Take 0.125 mg by mouth every evening.  3  . prazosin (MINIPRESS) 1 MG capsule Take 1 mg by mouth every evening.  3  . ranitidine (ZANTAC) 150 MG capsule Take 150 mg by mouth 2 (two) times daily.  3  . torsemide (DEMADEX) 20 MG tablet Take 20 mg by mouth 2 (two) times daily.    . traZODone (DESYREL) 100 MG tablet Take 100 mg by mouth at bedtime.  3  . Vitamin D, Ergocalciferol, (DRISDOL) 50000 units CAPS capsule Take 50,000 Units by mouth every Friday.  0   No current facility-administered medications on file prior to visit.    Past Surgical History:  Procedure Laterality Date  . ANKLE ARTHROSCOPY WITH OPEN REDUCTION INTERNAL FIXATION (ORIF)      Allergies  Allergen Reactions  . Vancomycin Other (See Comments)    Red man syndrome    Social History   Socioeconomic History  . Marital status: Divorced    Spouse name: Not on file  . Number of  children: Not on file  . Years of education: Not on file  . Highest education level: Not on file  Occupational History  . Not on file  Tobacco Use  . Smoking status: Never Smoker  . Smokeless tobacco: Never Used  Substance and Sexual Activity  . Alcohol use: Not Currently  . Drug use: Not Currently  . Sexual activity: Not Currently  Other Topics Concern  . Not on file  Social History Narrative  . Not on file   Social Determinants of Health   Financial Resource Strain:   . Difficulty of Paying Living Expenses:   Food Insecurity:   . Worried About Charity fundraiser in the Last Year:   . Arboriculturist in the Last Year:   Transportation Needs:   . Film/video editor (Medical):   Marland Kitchen Lack of Transportation (Non-Medical):   Physical Activity:   . Days of Exercise per Week:   . Minutes of Exercise per Session:   Stress:   . Feeling of Stress :   Social Connections:   . Frequency of Communication with Friends and Family:   . Frequency of Social Gatherings with Friends and Family:   . Attends Religious Services:   . Active Member of Clubs or Organizations:   . Attends Archivist Meetings:   Marland Kitchen Marital Status:   Intimate Partner Violence:   . Fear of Current or Ex-Partner:   . Emotionally Abused:   Marland Kitchen Physically Abused:   . Sexually Abused:     History reviewed. No pertinent family history.  BP (!) 153/85   Ht 5\' 7"  (1.702 m)   Wt 219 lb (99.3 kg)   LMP  (LMP Unknown)   BMI 34.30 kg/m   Review of Systems: See HPI above.     Objective:  Physical Exam:  Gen: NAD, comfortable in exam room  Left shoulder: No swelling, ecchymoses.  No gross deformity. No TTP. Full passive motion.  Full active IR and ER.  Flexion and abduction to 120 degrees actively. Negative Hawkins, Neers. Strength 5/5 with empty can and resisted internal/external rotation.  Pain empty can. NV intact distally.   Assessment & Plan:  1. Left shoulder pain - 2/2 severe rotator  cuff tendinopathy with partial tearing.  Improvement with subacromial injection and home exercises.  Given number for PT to call them directly as she's not received their messages.  No benefit with nitro patches.  Consider MRI, repeat injection if not improving.  F/u in 6 weeks.

## 2019-12-05 NOTE — Patient Instructions (Signed)
You have severe rotator cuff tendinopathy with partial tearing. Start physical therapy and do home exercises on days you don't go to therapy. Ok to take tylenol, aleve as needed. Follow up with me in 6 weeks for reevaluation. We will consider a repeat injection, MRI if not improving as expected.

## 2020-01-03 ENCOUNTER — Ambulatory Visit (INDEPENDENT_AMBULATORY_CARE_PROVIDER_SITE_OTHER): Payer: Medicaid Other | Admitting: Vascular Surgery

## 2020-01-03 ENCOUNTER — Encounter: Payer: Self-pay | Admitting: Vascular Surgery

## 2020-01-03 ENCOUNTER — Other Ambulatory Visit: Payer: Self-pay

## 2020-01-03 ENCOUNTER — Ambulatory Visit (HOSPITAL_COMMUNITY)
Admission: RE | Admit: 2020-01-03 | Discharge: 2020-01-03 | Disposition: A | Discharge status: Home / Self Care | Payer: Medicaid Other | Source: Ambulatory Visit | Attending: Vascular Surgery | Admitting: Vascular Surgery

## 2020-01-03 DIAGNOSIS — M7989 Other specified soft tissue disorders: Secondary | ICD-10-CM | POA: Diagnosis not present

## 2020-01-03 DIAGNOSIS — I739 Peripheral vascular disease, unspecified: Secondary | ICD-10-CM | POA: Insufficient documentation

## 2020-01-03 NOTE — Progress Notes (Signed)
Patient name: Jessica Mercado MRN: [id number] DOB: Jun 01, 1960 Sex: female  REASON FOR CONSULT: Evaluate for PVD  HPI: Jessica Mercado is a 59 y.o. female, with history of diabetes, CKD, hypertension that presents for evaluation of peripheral vascular disease.  Patient states that ultimately she has had significant swelling in both lower extremities with wounds on her left leg and was sent over by [facility].  She believes she is here for leg swelling.  Referral states evaluation for PVD.  She denies any previous lower extremity interventions.  She denies any tobacco abuse.  She states both her feet feel numb all the time because of her diabetes.  No leg pain with walking.  Past Medical History:  Diagnosis Date   Diabetes mellitus without complication ([place])    Hypertension     Past Surgical History:  Procedure Laterality Date   ANKLE ARTHROSCOPY WITH OPEN REDUCTION INTERNAL FIXATION (OR[facility])      History reviewed. No pertinent family history.  SOCIAL HISTORY: Social History   Socioeconomic History   Marital status: Divorced    Spouse name: Not on file   Number of children: Not on file   Years of education: Not on file   Highest education level: Not on file  Occupational History   Not on file  Tobacco Use   Smoking status: Never Smoker   Smokeless tobacco: Never Used  Vaping Use   Vaping Use: Never used  Substance and Sexual Activity   Alcohol use: Not Currently   Drug use: Not Currently   Sexual activity: Not Currently  Other Topics Concern   Not on file  Social History Narrative   Not on file   Social Determinants of Health   Financial Resource Strain:    Difficulty of Paying Living Expenses:   Food Insecurity:    Worried About Charity fundraiser in the Last Year:    Arboriculturist in the Last Year:   Transportation Needs:    Film/video editor (Medical):    Lack of Transportation (Non-Medical):   Physical  Activity:    Days of Exercise per Week:    Minutes of Exercise per Session:   Stress:    Feeling of Stress :   Social Connections:    Frequency of Communication with Friends and Family:    Frequency of Social Gatherings with Friends and Family:    Attends Religious Services:    Active Member of Clubs or Organizations:    Attends Archivist Meetings:    Marital Status:   Intimate Partner Violence:    Fear of Current or Ex-Partner:    Emotionally Abused:    Physically Abused:    Sexually Abused:     Allergies  Allergen Reactions   Vancomycin Other (See Comments)    Red man syndrome    Current Outpatient Medications  Medication Sig Dispense Refill   atorvastatin (LIPITOR) 40 MG tablet Take 40 mg by mouth daily.  3   busPIRone (BUSPAR) 5 MG tablet Take 5 mg by mouth 3 (three) times daily.  3   chlorthalidone (HYGROTON) 25 MG tablet Take 25 mg by mouth daily.  3   cholestyramine (QUESTRAN) 4 g packet Take 1 packet by mouth 4 (four) times daily.  3   cilostazol (PLETAL) 50 MG tablet Take 50 mg by mouth 2 (two) times daily.  3   cyclobenzaprine (FLEXERIL) 10 MG tablet Take 1 tablet (10 mg total) by mouth  2 (two) times daily as needed for muscle spasms. 20 tablet 0   dicyclomine (BENTYL) 20 MG tablet Take 20 mg by mouth 4 (four) times daily.  3   FLUoxetine (PROZAC) 40 MG capsule Take 40 mg by mouth every morning.     gabapentin (NEURONTIN) 800 MG tablet Take 800 mg by mouth 3 (three) times daily.  3   glipiZIDE (GLUCOTROL) 5 MG tablet TAKE 1 TABLET BY MOUTH TWICE A DAY BEFORE MEALS     HUMALOG MIX 75/25 KWIKPEN (75-25) 100 UNIT/ML Kwikpen Inject 45 Units into the skin 2 (two) times daily.  3   hydrOXYzine (ATARAX/VISTARIL) 25 MG tablet Take 25 mg by mouth 3 (three) times daily.  0   lamoTRIgine (LAMICTAL) 100 MG tablet Take 100 mg by mouth 2 (two) times daily.     LINZESS 145 MCG CAPS capsule Take 145 mcg by mouth daily.  3   metFORMIN  (GLUCOPHAGE) 500 MG tablet Take 500 mg by mouth 2 (two) times daily.  3   metoprolol succinate (TOPROL-XL) 50 MG 24 hr tablet Take 50 mg by mouth daily.  3   nitroGLYCERIN (NITRODUR - DOSED IN MG/24 HR) 0.2 mg/hr patch Apply 1/4th patch to affected shoulder, change daily 30 patch 1   OZEMPIC 0.25 or 0.5 MG/DOSE SOPN Inject 0.25 mg into the skin every Tuesday.  3   pantoprazole (PROTONIX) 20 MG tablet TAKE 1 TABLET BY MOUTH EVERY DAY IN THE MORNING     pramipexole (MIRAPEX) 0.125 MG tablet Take 0.125 mg by mouth every evening.  3   prazosin (MINIPRESS) 1 MG capsule Take 1 mg by mouth every evening.  3   ranitidine (ZANTAC) 150 MG capsule Take 150 mg by mouth 2 (two) times daily.  3   torsemide (DEMADEX) 20 MG tablet Take 20 mg by mouth 2 (two) times daily.     traZODone (DESYREL) 100 MG tablet Take 100 mg by mouth at bedtime.  3   Vitamin D, Ergocalciferol, (DRISDOL) 50000 units CAPS capsule Take 50,000 Units by mouth every Friday.  0   No current facility-administered medications for this visit.    REVIEW OF SYSTEMS:  [X]  denotes positive finding, [ ]  denotes negative finding Cardiac  Comments:  Chest pain or chest pressure:    Shortness of breath upon exertion:    Short of breath when lying flat:    Irregular heart rhythm:        Vascular    Pain in calf, thigh, or hip brought on by ambulation:    Pain in feet at night that wakes you up from your sleep:     Blood clot in your veins:    Leg swelling:  x       Pulmonary    Oxygen at home:    Productive cough:     Wheezing:         Neurologic    Sudden weakness in arms or legs:     Sudden numbness in arms or legs:     Sudden onset of difficulty speaking or slurred speech:    Temporary loss of vision in one eye:     Problems with dizziness:         Gastrointestinal    Blood in stool:     Vomited blood:         Genitourinary    Burning when urinating:     Blood in urine:        Psychiatric    Major  depression:          Hematologic    Bleeding problems:    Problems with blood clotting too easily:        Skin    Rashes or ulcers:        Constitutional    Fever or chills:      PHYSICAL EXAM: Vitals:   01/03/20 1343  BP: 128/83  Pulse: 81  Resp: 20  Temp: 97.7 F (36.5 C)  SpO2: 97%  Weight: 233 lb (105.7 kg)  Height: 5\' 7"  (1.702 m)    GENERAL: The patient is a well-nourished female, in no acute distress. The vital signs are documented above. CARDIAC: There is a regular rate and rhythm.  VASCULAR:  Palpable femoral pulses bilaterally Palpable dorsalis pedis posterior tibial pulses bilaterally Bilateral lower extremity edema as pictured below, some skin thickening on left  PULMONARY: There is good air exchange bilaterally without wheezing or rales. ABDOMEN: Soft and non-tender with normal pitched bowel sounds.  MUSCULOSKELETAL: There are no major deformities or cyanosis. NEUROLOGIC: No focal weakness or paresthesias are detected. SKIN: There are no ulcers or rashes noted. PSYCHIATRIC: The patient has a normal affect.      DATA:   ABIs today are 1.14 on the right triphasic and 1.27 on the left triphasic  Assessment/Plan:  59 year old female that was sent for evaluation of possible peripheral vascular disease.  I offered reassurance to the patient that she has a normal exam with palpable femoral as well as pedal pulses (DP and PT bilaterally).  Her ABIs were normal today greater than 1 with triphasic waveforms and this is normal as well.  She does not have any evidence of significant peripheral arterial disease at this time which I reiterated to the patient and her fianc.  In further discussing her symptoms her main complaint has been lower extremity swelling.  She did have a left leg DVT study in November 2020 that was negative for DVT.  Discussed that I am not certain that her skin changes particularly in the left leg are all venous disease but she does have some skin thickening  and edema that could be consistent with venous disease.  Will order lower extremity reflux study and have her follow-up after that study is complete.  Discussed that if this study is normal we will probably refer her over to dermatology for further evaluation of the scabs on her left shin's.   Marty Heck, MD [facility] Office: [phone]

## 2020-01-04 ENCOUNTER — Other Ambulatory Visit: Payer: Self-pay | Admitting: *Deleted

## 2020-01-04 DIAGNOSIS — M7989 Other specified soft tissue disorders: Secondary | ICD-10-CM

## 2020-01-16 ENCOUNTER — Encounter: Payer: Self-pay | Admitting: Family Medicine

## 2020-01-16 ENCOUNTER — Ambulatory Visit (INDEPENDENT_AMBULATORY_CARE_PROVIDER_SITE_OTHER): Payer: Medicaid Other | Admitting: Family Medicine

## 2020-01-16 ENCOUNTER — Other Ambulatory Visit: Payer: Self-pay

## 2020-01-16 VITALS — BP 145/87 | Ht 67.0 in | Wt 220.0 lb

## 2020-01-16 DIAGNOSIS — M25512 Pain in left shoulder: Secondary | ICD-10-CM

## 2020-01-16 DIAGNOSIS — G8929 Other chronic pain: Secondary | ICD-10-CM

## 2020-01-16 NOTE — Progress Notes (Signed)
PCP: Vassie Moment, MD  Subjective:   HPI: Patient is a 59 y.o. female here for left shoulder pain.  4/12: Patient has 2 year history of right shoulder pain after she was holding onto a bar to lower herself into the tub and her shoulder was pushed up in its socket and she felt a tearing sensation. She had Xray at the time and had no bony abnormality. She then injured it again about a year later doing the same thing and Xray was negative as well. Her pain is diffuse in the shoulder joint and she has limited mobility due to pain. She keeps her left arm at her side most of the time and avoids using it for daily tasks.   5/24: Patient reports she feels about the same compared to last visit. Used nitro patches but didn't notice a benefit to these. Doing home exercises but did not start physical therapy. Pain level 8/10 and sharp, worse with lying on left side.  7/12: Patient reports she's doing well. Pain level down to 4/10 since injection, motion some improved. No night pain. Doing home exercises but has not heard from physical therapy.  8/23: Patient reports she's doing much better.  Been more extensively doing home exercise program. Able to reach fully overhead now. No night pain. Not needing any medication for pain of shoulder.  Past Medical History:  Diagnosis Date   Diabetes mellitus without complication ([place])    Hypertension     Current Outpatient Medications on File Prior to Visit  Medication Sig Dispense Refill   atorvastatin (LIPITOR) 40 MG tablet Take 40 mg by mouth daily.  3   busPIRone (BUSPAR) 5 MG tablet Take 5 mg by mouth 3 (three) times daily.  3   chlorthalidone (HYGROTON) 25 MG tablet Take 25 mg by mouth daily.  3   cholestyramine (QUESTRAN) 4 g packet Take 1 packet by mouth 4 (four) times daily.  3   cilostazol (PLETAL) 50 MG tablet Take 50 mg by mouth 2 (two) times daily.  3   cyclobenzaprine (FLEXERIL) 10 MG tablet Take 1 tablet (10 mg total) by mouth  2 (two) times daily as needed for muscle spasms. 20 tablet 0   dicyclomine (BENTYL) 20 MG tablet Take 20 mg by mouth 4 (four) times daily.  3   FLUoxetine (PROZAC) 40 MG capsule Take 40 mg by mouth every morning.     gabapentin (NEURONTIN) 800 MG tablet Take 800 mg by mouth 3 (three) times daily.  3   glipiZIDE (GLUCOTROL) 5 MG tablet TAKE 1 TABLET BY MOUTH TWICE A DAY BEFORE MEALS     HUMALOG MIX 75/25 KWIKPEN (75-25) 100 UNIT/ML Kwikpen Inject 45 Units into the skin 2 (two) times daily.  3   hydrOXYzine (ATARAX/VISTARIL) 25 MG tablet Take 25 mg by mouth 3 (three) times daily.  0   lamoTRIgine (LAMICTAL) 100 MG tablet Take 100 mg by mouth 2 (two) times daily.     LINZESS 145 MCG CAPS capsule Take 145 mcg by mouth daily.  3   metFORMIN (GLUCOPHAGE) 500 MG tablet Take 500 mg by mouth 2 (two) times daily.  3   metoprolol succinate (TOPROL-XL) 50 MG 24 hr tablet Take 50 mg by mouth daily.  3   nitroGLYCERIN (NITRODUR - DOSED IN MG/24 HR) 0.2 mg/hr patch Apply 1/4th patch to affected shoulder, change daily 30 patch 1   OZEMPIC 0.25 or 0.5 MG/DOSE SOPN Inject 0.25 mg into the skin every Tuesday.  3  pantoprazole (PROTONIX) 20 MG tablet TAKE 1 TABLET BY MOUTH EVERY DAY IN THE MORNING     pramipexole (MIRAPEX) 0.125 MG tablet Take 0.125 mg by mouth every evening.  3   prazosin (MINIPRESS) 1 MG capsule Take 1 mg by mouth every evening.  3   ranitidine (ZANTAC) 150 MG capsule Take 150 mg by mouth 2 (two) times daily.  3   torsemide (DEMADEX) 20 MG tablet Take 20 mg by mouth 2 (two) times daily.     traZODone (DESYREL) 100 MG tablet Take 100 mg by mouth at bedtime.  3   Vitamin D, Ergocalciferol, (DRISDOL) 50000 units CAPS capsule Take 50,000 Units by mouth every Friday.  0   No current facility-administered medications on file prior to visit.    Past Surgical History:  Procedure Laterality Date   ANKLE ARTHROSCOPY WITH OPEN REDUCTION INTERNAL FIXATION (ORIF)      Allergies   Allergen Reactions   Vancomycin Other (See Comments)    Red man syndrome    Social History   Socioeconomic History   Marital status: Divorced    Spouse name: Not on file   Number of children: Not on file   Years of education: Not on file   Highest education level: Not on file  Occupational History   Not on file  Tobacco Use   Smoking status: Never Smoker   Smokeless tobacco: Never Used  Vaping Use   Vaping Use: Never used  Substance and Sexual Activity   Alcohol use: Not Currently   Drug use: Not Currently   Sexual activity: Not Currently  Other Topics Concern   Not on file  Social History Narrative   Not on file   Social Determinants of Health   Financial Resource Strain:    Difficulty of Paying Living Expenses: Not on file  Food Insecurity:    Worried About [place] in the Last Year: Not on file   Ran Out of Food in the Last Year: Not on file  Transportation Needs:    Lack of Transportation (Medical): Not on file   Lack of Transportation (Non-Medical): Not on file  Physical Activity:    Days of Exercise per Week: Not on file   Minutes of Exercise per Session: Not on file  Stress:    Feeling of Stress : Not on file  Social Connections:    Frequency of Communication with Friends and Family: Not on file   Frequency of Social Gatherings with Friends and Family: Not on file   Attends Religious Services: Not on file   Active Member of Clubs or Organizations: Not on file   Attends Archivist Meetings: Not on file   Marital Status: Not on file  Intimate Partner Violence:    Fear of Current or Ex-Partner: Not on file   Emotionally Abused: Not on file   Physically Abused: Not on file   Sexually Abused: Not on file    History reviewed. No pertinent family history.  BP (!) 145/87    Ht 5\' 7"  (1.702 m)    Wt 220 lb (99.8 kg)    LMP  (LMP Unknown)    BMI 34.46 kg/m   Review of Systems: See HPI above.      Objective:  Physical Exam:  Gen: NAD, comfortable in exam room  Left shoulder: No swelling, ecchymoses.  No gross deformity. No TTP. FROM. Negative Hawkins, Neers. Negative Yergasons. Strength 5/5 with empty can and resisted internal/external rotation. NV intact  distally.   Assessment & Plan:  1. Left shoulder pain - 2/2 severe rotator cuff tendinopathy with partial tearing.  Much improved with subacromial injection, extensive home exercise program.  Continue home exercises.  Topical medication, tylenol only if needed.  F/u prn.

## 2020-02-08 ENCOUNTER — Encounter (HOSPITAL_COMMUNITY): Payer: Medicaid Other

## 2020-02-08 ENCOUNTER — Ambulatory Visit: Payer: Medicaid Other

## 2020-03-29 ENCOUNTER — Emergency Department (HOSPITAL_COMMUNITY): Payer: Medicaid Other

## 2020-03-29 ENCOUNTER — Other Ambulatory Visit: Payer: Self-pay

## 2020-03-29 ENCOUNTER — Inpatient Hospital Stay (HOSPITAL_COMMUNITY)
Admission: EM | Admit: 2020-03-29 | Discharge: 2020-04-01 | DRG: 193 | Disposition: A | Discharge status: Home / Self Care | Payer: Medicaid Other | Attending: Student | Admitting: Student

## 2020-03-29 ENCOUNTER — Encounter (HOSPITAL_COMMUNITY): Payer: Self-pay

## 2020-03-29 DIAGNOSIS — Z6834 Body mass index (BMI) 34.0-34.9, adult: Secondary | ICD-10-CM

## 2020-03-29 DIAGNOSIS — Z7984 Long term (current) use of oral hypoglycemic drugs: Secondary | ICD-10-CM | POA: Diagnosis not present

## 2020-03-29 DIAGNOSIS — E669 Obesity, unspecified: Secondary | ICD-10-CM | POA: Diagnosis present

## 2020-03-29 DIAGNOSIS — N179 Acute kidney failure, unspecified: Secondary | ICD-10-CM | POA: Diagnosis present

## 2020-03-29 DIAGNOSIS — E785 Hyperlipidemia, unspecified: Secondary | ICD-10-CM | POA: Diagnosis present

## 2020-03-29 DIAGNOSIS — Z20822 Contact with and (suspected) exposure to covid-19: Secondary | ICD-10-CM | POA: Diagnosis present

## 2020-03-29 DIAGNOSIS — J189 Pneumonia, unspecified organism: Secondary | ICD-10-CM

## 2020-03-29 DIAGNOSIS — Z794 Long term (current) use of insulin: Secondary | ICD-10-CM | POA: Diagnosis not present

## 2020-03-29 DIAGNOSIS — E1140 Type 2 diabetes mellitus with diabetic neuropathy, unspecified: Secondary | ICD-10-CM | POA: Diagnosis present

## 2020-03-29 DIAGNOSIS — Z79899 Other long term (current) drug therapy: Secondary | ICD-10-CM

## 2020-03-29 DIAGNOSIS — E1122 Type 2 diabetes mellitus with diabetic chronic kidney disease: Secondary | ICD-10-CM

## 2020-03-29 DIAGNOSIS — E878 Other disorders of electrolyte and fluid balance, not elsewhere classified: Secondary | ICD-10-CM | POA: Diagnosis present

## 2020-03-29 DIAGNOSIS — I10 Essential (primary) hypertension: Secondary | ICD-10-CM | POA: Diagnosis present

## 2020-03-29 DIAGNOSIS — E871 Hypo-osmolality and hyponatremia: Secondary | ICD-10-CM | POA: Diagnosis present

## 2020-03-29 DIAGNOSIS — Z888 Allergy status to other drugs, medicaments and biological substances status: Secondary | ICD-10-CM

## 2020-03-29 DIAGNOSIS — J9601 Acute respiratory failure with hypoxia: Secondary | ICD-10-CM | POA: Diagnosis present

## 2020-03-29 DIAGNOSIS — E1165 Type 2 diabetes mellitus with hyperglycemia: Secondary | ICD-10-CM | POA: Diagnosis present

## 2020-03-29 DIAGNOSIS — J440 Chronic obstructive pulmonary disease with acute lower respiratory infection: Secondary | ICD-10-CM | POA: Diagnosis present

## 2020-03-29 DIAGNOSIS — F419 Anxiety disorder, unspecified: Secondary | ICD-10-CM | POA: Diagnosis present

## 2020-03-29 DIAGNOSIS — F32A Depression, unspecified: Secondary | ICD-10-CM | POA: Diagnosis present

## 2020-03-29 DIAGNOSIS — J9621 Acute and chronic respiratory failure with hypoxia: Secondary | ICD-10-CM | POA: Diagnosis not present

## 2020-03-29 DIAGNOSIS — R5381 Other malaise: Secondary | ICD-10-CM | POA: Diagnosis not present

## 2020-03-29 HISTORY — DX: Hyperlipidemia, unspecified: E78.5

## 2020-03-29 HISTORY — DX: Pneumonia, unspecified organism: J18.9

## 2020-03-29 LAB — CBC WITH DIFFERENTIAL/PLATELET
Abs Immature Granulocytes: 0.04 10*3/uL (ref 0.00–0.07)
Basophils Absolute: 0 10*3/uL (ref 0.0–0.1)
Basophils Relative: 0 %
Eosinophils Absolute: 0.1 10*3/uL (ref 0.0–0.5)
Eosinophils Relative: 1 %
HCT: 36.4 % (ref 36.0–46.0)
Hemoglobin: 12.4 g/dL (ref 12.0–15.0)
Immature Granulocytes: 0 %
Lymphocytes Relative: 9 %
Lymphs Abs: 0.9 10*3/uL (ref 0.7–4.0)
MCH: 28.8 pg (ref 26.0–34.0)
MCHC: 34.1 g/dL (ref 30.0–36.0)
MCV: 84.7 fL (ref 80.0–100.0)
Monocytes Absolute: 0.8 10*3/uL (ref 0.1–1.0)
Monocytes Relative: 8 %
Neutro Abs: 8 10*3/uL — ABNORMAL HIGH (ref 1.7–7.7)
Neutrophils Relative %: 82 %
Platelets: 212 10*3/uL (ref 150–400)
RBC: 4.3 MIL/uL (ref 3.87–5.11)
RDW: 13.7 % (ref 11.5–15.5)
WBC: 9.9 10*3/uL (ref 4.0–10.5)
nRBC: 0 % (ref 0.0–0.2)

## 2020-03-29 LAB — BRAIN NATRIURETIC PEPTIDE: B Natriuretic Peptide: 112.8 pg/mL — ABNORMAL HIGH (ref 0.0–100.0)

## 2020-03-29 LAB — BASIC METABOLIC PANEL
Anion gap: 11 (ref 5–15)
BUN: 32 mg/dL — ABNORMAL HIGH (ref 6–20)
CO2: 23 mmol/L (ref 22–32)
Calcium: 8.3 mg/dL — ABNORMAL LOW (ref 8.9–10.3)
Chloride: 94 mmol/L — ABNORMAL LOW (ref 98–111)
Creatinine, Ser: 2.29 mg/dL — ABNORMAL HIGH (ref 0.44–1.00)
GFR, Estimated: 24 mL/min — ABNORMAL LOW (ref >60)
Glucose, Bld: 503 mg/dL (ref 70–99)
Potassium: 4.5 mmol/L (ref 3.5–5.1)
Sodium: 128 mmol/L — ABNORMAL LOW (ref 135–145)

## 2020-03-29 LAB — RESPIRATORY PANEL BY RT PCR (FLU A&B, COVID)
Influenza A by PCR: NEGATIVE
Influenza B by PCR: NEGATIVE
SARS Coronavirus 2 by RT PCR: NEGATIVE

## 2020-03-29 LAB — TROPONIN I (HIGH SENSITIVITY): Troponin I (High Sensitivity): 14 ng/L (ref <18)

## 2020-03-29 LAB — CBG MONITORING, ED: Glucose-Capillary: 452 mg/dL — ABNORMAL HIGH (ref 70–99)

## 2020-03-29 MED ORDER — SODIUM CHLORIDE 0.9 % IV SOLN: Freq: Once | INTRAVENOUS | Status: AC

## 2020-03-29 MED ORDER — SODIUM CHLORIDE 0.9 % IV SOLN
1.0000 g | INTRAVENOUS | Status: DC
  Administered 2020-03-30 – 2020-03-31 (×2): 1 g via INTRAVENOUS
  Filled 2020-03-29 (×2): qty 10
  Filled 2020-03-29: qty 1

## 2020-03-29 MED ORDER — INSULIN ASPART 100 UNIT/ML ~~LOC~~ SOLN
0.0000 [IU] | Freq: Every day | SUBCUTANEOUS | Status: DC
  Administered 2020-03-30: 5 [IU] via SUBCUTANEOUS
  Filled 2020-03-29: qty 0.05

## 2020-03-29 MED ORDER — BENZONATATE 100 MG PO CAPS
100.0000 mg | ORAL_CAPSULE | Freq: Three times a day (TID) | ORAL | Status: DC | PRN
  Filled 2020-03-29: qty 1

## 2020-03-29 MED ORDER — SODIUM CHLORIDE 0.9 % IV SOLN
500.0000 mg | INTRAVENOUS | Status: DC
  Administered 2020-03-30 – 2020-03-31 (×2): 500 mg via INTRAVENOUS
  Filled 2020-03-29 (×3): qty 500

## 2020-03-29 MED ORDER — HEPARIN SODIUM (PORCINE) 5000 UNIT/ML IJ SOLN
5000.0000 [IU] | Freq: Three times a day (TID) | INTRAMUSCULAR | Status: DC
  Administered 2020-03-29 – 2020-04-01 (×8): 5000 [IU] via SUBCUTANEOUS
  Filled 2020-03-29 (×7): qty 1

## 2020-03-29 MED ORDER — INSULIN ASPART 100 UNIT/ML ~~LOC~~ SOLN
0.0000 [IU] | Freq: Three times a day (TID) | SUBCUTANEOUS | Status: DC
  Administered 2020-03-30: 15 [IU] via SUBCUTANEOUS
  Filled 2020-03-29: qty 0.2

## 2020-03-29 MED ORDER — SODIUM CHLORIDE 0.9 % IV SOLN: INTRAVENOUS | Status: DC

## 2020-03-29 MED ORDER — SODIUM CHLORIDE 0.9 % IV SOLN
1.0000 g | Freq: Once | INTRAVENOUS | Status: AC
  Administered 2020-03-29: 1 g via INTRAVENOUS
  Filled 2020-03-29: qty 10

## 2020-03-29 MED ORDER — SODIUM CHLORIDE 0.9 % IV SOLN
500.0000 mg | Freq: Once | INTRAVENOUS | Status: AC
  Administered 2020-03-29: 500 mg via INTRAVENOUS
  Filled 2020-03-29: qty 500

## 2020-03-29 MED ORDER — IPRATROPIUM-ALBUTEROL 0.5-2.5 (3) MG/3ML IN SOLN: 3.0000 mL | RESPIRATORY_TRACT | Status: DC | PRN

## 2020-03-29 NOTE — ED Triage Notes (Signed)
Patient here from home, dx with pneumonia 2 days ago in [place], got today and called EMS stating that she was still not feeling well.  Per EMS patient was sent home on steroids and abx, Covid test neg 2 days ago.

## 2020-03-29 NOTE — ED Provider Notes (Signed)
[facility] DEPT Provider Note   CSN: [id number] Arrival date & time: 03/29/20  1945     History Chief Complaint  Patient presents with  . Pneumonia    patient here from home, dx with pneumonia 2 days ago in [place], got today and called EMS stating that she was still not feeling well.    Jessica Mercado is a 59 y.o. female.  Reports over the last few days she has had cough, congestion, fever.  Went to [facility] 2 days ago and was diagnosed with pneumonia, reports that she was sent home with prescription for steroids and antibiotics but has not started taking either of these medications.  Reports throughout the day today having worsening difficulty breathing.  Unsure of temperature at home.  Per EMS report, temp 102.  HPI     Past Medical History:  Diagnosis Date  . Diabetes mellitus without complication ([place])   . Hyperlipemia   . Hypertension     Patient Active Problem List   Diagnosis Date Noted  . Pneumonia 03/29/2020  . Leg swelling 01/03/2020    Past Surgical History:  Procedure Laterality Date  . ANKLE ARTHROSCOPY WITH OPEN REDUCTION INTERNAL FIXATION (ORIF)       OB History   No obstetric history on file.     No family history on file.  Social History   Tobacco Use  . Smoking status: Never Smoker  . Smokeless tobacco: Never Used  Vaping Use  . Vaping Use: Never used  Substance Use Topics  . Alcohol use: Not Currently  . Drug use: Not Currently    Home Medications Prior to Admission medications   Medication Sig Start Date End Date Taking? Authorizing Provider  atorvastatin (LIPITOR) 40 MG tablet Take 40 mg by mouth daily. 12/21/17  Yes [provider]  FLUoxetine (PROZAC) 40 MG capsule Take 40 mg by mouth every morning. 09/04/18  Yes [provider]  gabapentin (NEURONTIN) 800 MG tablet Take 800 mg by mouth 3 (three) times daily. 12/21/17  Yes [provider]  glipiZIDE (GLUCOTROL) 10 MG  tablet Take 10 mg by mouth 2 (two) times daily. 02/06/20  Yes [provider]  HUMALOG MIX 75/25 KWIKPEN (75-25) 100 UNIT/ML Kwikpen Inject 40 Units into the skin 2 (two) times daily.  12/21/17  Yes [provider]  hydrOXYzine (ATARAX/VISTARIL) 25 MG tablet Take 75 mg by mouth daily.  11/06/17  Yes [provider]  LATUDA 40 MG TABS tablet Take 40 mg by mouth at bedtime. 03/27/20  Yes [provider]  metoprolol succinate (TOPROL-XL) 50 MG 24 hr tablet Take 50 mg by mouth daily. 12/21/17  Yes [provider]  OZEMPIC 0.25 or 0.5 MG/DOSE SOPN Inject 0.25 mg into the skin once a week. Take on Saturday 01/04/18  Yes [provider]  pramipexole (MIRAPEX) 0.125 MG tablet Take 0.125 mg by mouth every evening. 12/21/17  Yes [provider]  prazosin (MINIPRESS) 1 MG capsule Take 1 mg by mouth every evening. 12/22/17  Yes [provider]  silver sulfADIAZINE (SILVADENE) 1 % cream Apply 1 application topically daily.  02/06/20  Yes [provider]  traZODone (DESYREL) 100 MG tablet Take 300 mg by mouth at bedtime.  12/21/17  Yes [provider]  Vitamin D, Ergocalciferol, (DRISDOL) 50000 units CAPS capsule Take 50,000 Units by mouth every Friday. 11/06/17  Yes [provider]  cyclobenzaprine (FLEXERIL) 10 MG tablet Take 1 tablet (10 mg total) by mouth 2 (  two) times daily as needed for muscle spasms. Patient not taking: Reported on 03/29/2020 11/22/17   Ashley Murrain, NP  nitroGLYCERIN (NITRODUR - DOSED IN MG/24 HR) 0.2 mg/hr patch Apply 1/4th patch to affected shoulder, change daily Patient not taking: Reported on 03/29/2020 09/05/19   Dene Gentry, MD    Allergies    Vancomycin  Review of Systems   Review of Systems  Constitutional: Negative for chills and fever.  HENT: Negative for ear pain and sore throat.   Eyes: Negative for pain and visual disturbance.  Respiratory: Positive for cough and shortness of  breath.   Cardiovascular: Negative for chest pain and palpitations.  Gastrointestinal: Negative for abdominal pain and vomiting.  Genitourinary: Negative for dysuria and hematuria.  Musculoskeletal: Negative for arthralgias and back pain.  Skin: Negative for color change and rash.  Neurological: Negative for seizures and syncope.  All other systems reviewed and are negative.   Physical Exam Updated Vital Signs BP 131/82   Pulse 82   Temp 99.2 F (37.3 C) (Oral)   Resp (!) 22   Ht 5\' 7"  (1.702 m)   Wt 101.2 kg   LMP  (LMP Unknown)   SpO2 94%   BMI 34.93 kg/m   Physical Exam Vitals and nursing note reviewed.  Constitutional:      General: She is not in acute distress.    Appearance: She is well-developed.  HENT:     Head: Normocephalic and atraumatic.  Eyes:     Conjunctiva/sclera: Conjunctivae normal.  Cardiovascular:     Rate and Rhythm: Normal rate and regular rhythm.     Heart sounds: No murmur heard.   Pulmonary:     Comments: Mild tachypnea but no distress, mild crackles noted bilaterally Abdominal:     Palpations: Abdomen is soft.     Tenderness: There is no abdominal tenderness.  Musculoskeletal:     Cervical back: Normal range of motion and neck supple.     Comments: Mild edema noted to lower legs  Skin:    General: Skin is warm and dry.  Neurological:     General: No focal deficit present.     Mental Status: She is alert.  Psychiatric:        Mood and Affect: Mood normal.     ED Results / Procedures / Treatments   Labs (all labs ordered are listed, but only abnormal results are displayed) Labs Reviewed  CBC WITH DIFFERENTIAL/PLATELET - Abnormal; Notable for the following components:      Result Value   Neutro Abs 8.0 (*)    All other components within normal limits  BASIC METABOLIC PANEL - Abnormal; Notable for the following components:   Sodium 128 (*)    Chloride 94 (*)    Glucose, Bld 503 (*)    BUN 32 (*)    Creatinine, Ser 2.29 (*)     Calcium 8.3 (*)    GFR, Estimated 24 (*)    All other components within normal limits  BRAIN NATRIURETIC PEPTIDE - Abnormal; Notable for the following components:   B Natriuretic Peptide 112.8 (*)    All other components within normal limits  CBG MONITORING, ED - Abnormal; Notable for the following components:   Glucose-Capillary 452 (*)    All other components within normal limits  RESPIRATORY PANEL BY RT PCR (FLU A&B, COVID)  HIV ANTIBODY (ROUTINE TESTING W REFLEX)  CBC  LEGIONELLA PNEUMOPHILA SEROGP 1 UR AG  STREP PNEUMONIAE URINARY ANTIGEN  HEMOGLOBIN  A1C  TROPONIN I (HIGH SENSITIVITY)  TROPONIN I (HIGH SENSITIVITY)    EKG EKG Interpretation  Date/Time:  Thursday March 29 2020 20:00:20 EDT Ventricular Rate:  94 PR Interval:    QRS Duration: 87 QT Interval:  361 QTC Calculation: 452 R Axis:   -28 Text Interpretation: Sinus rhythm Borderline left axis deviation Anterior infarct, old Confirmed by Madalyn Rob [phone]) on 03/29/2020 8:38:49 PM   Radiology CT Chest Wo Contrast  Result Date: 03/29/2020 CLINICAL DATA:  Cough and fever.  Abnormal chest x-ray. EXAM: CT CHEST WITHOUT CONTRAST TECHNIQUE: Multidetector CT imaging of the chest was performed following the standard protocol without IV contrast. COMPARISON:  Radiograph earlier today. FINDINGS: Cardiovascular: Atherosclerosis of the thoracic aorta. No aortic aneurysm. There is multi chamber cardiomegaly. Coronary artery calcifications. Small pericardial effusion that measures up to 12 mm in depth adjacent to the right heart. Mediastinum/Nodes: No enlarged mediastinal lymph nodes. Limited assessment for hilar adenopathy in the absence of IV contrast. There is no evidence of bulky hilar adenopathy or hilar mass. 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 No visualized thyroid nodule. Small hiatal hernia with distal esophageal wall thickening. Lungs/Pleura: Patchy and ground-glass opacities within both lower lobes, with a more  consolidative component on the left. There may be mild tree-in-bud opacities in the upper lobes. Low lung volumes and mild motion artifact limits detailed assessment. There is mucous plugging in the left lower lobe. Trachea and central bronchi are patent. Small bilateral pleural effusions. Upper Abdomen: No acute or unexpected findings. Musculoskeletal: Thoracic spondylosis. There are no acute or suspicious osseous abnormalities. Surgical hardware in the lower cervical spine is partially included. IMPRESSION: 1. Patchy and ground-glass opacities within both lower lobes, with a more consolidative component on the left. Findings most consistent with pneumonia. There may be mild tree-in-bud opacities in the upper lobes, bronchiolitis. 2. Small bilateral pleural effusions. 3. Cardiomegaly with small pericardial effusion. Coronary artery calcifications. 4. Small hiatal hernia with distal esophageal wall thickening, can be seen with reflux or esophagitis. 5. There is no evidence of hilar or mediastinal adenopathy on this noncontrast exam. Radiographic findings likely a combination of vascular structures and mediastinal lipomatosis. Aortic Atherosclerosis (ICD10-I70.0). Electronically Signed   By: Keith Rake M.D.   On: 03/29/2020 21:51   DG Chest Portable 1 View  Result Date: 03/29/2020 CLINICAL DATA:  59 year old with fever and recent reported diagnosis of pneumonia EXAM: PORTABLE CHEST 1 VIEW COMPARISON:  None FINDINGS: Trachea midline. Cardiomediastinal contours accentuated by low depth of expansion. Heart size likely enlarged. Hilar structures with some fullness. Thickening of RIGHT paratracheal stripe. Airspace disease at the LEFT lung base. No sign of effusion. On limited assessment no acute skeletal process. Signs of cervical spinal fusion. IMPRESSION: 1. Airspace disease at the LEFT lung base. Atelectasis versus infection. 2. Hilar structures with increased fullness in thickening of RIGHT paratracheal  stripe. Possibility of adenopathy in the chest is considered, chest CT may be helpful for further evaluation. 3. Question of variant bronchial anatomy as well, this could be further evaluated with chest CT. Electronically Signed   By: Zetta Bills M.D.   On: 03/29/2020 20:35    Procedures Procedures (including critical care time)  Medications Ordered in ED Medications  heparin injection 5,000 Units (5,000 Units Subcutaneous Given 03/29/20 2359)  0.9 %  sodium chloride infusion (has no administration in time range)  azithromycin (ZITHROMAX) 500 mg in sodium chloride 0.9 % 250 mL IVPB (has no administration in time range)  cefTRIAXone (ROCEPHIN) 1 g in  sodium chloride 0.9 % 100 mL IVPB (has no administration in time range)  ipratropium-albuterol (DUONEB) 0.5-2.5 (3) MG/3ML nebulizer solution 3 mL (has no administration in time range)  benzonatate (TESSALON) capsule 100 mg (has no administration in time range)  insulin aspart (novoLOG) injection 0-20 Units (has no administration in time range)  insulin aspart (novoLOG) injection 0-5 Units (has no administration in time range)  0.9 %  sodium chloride infusion ( Intravenous New Bag/Given 03/29/20 2303)  cefTRIAXone (ROCEPHIN) 1 g in sodium chloride 0.9 % 100 mL IVPB (0 g Intravenous Stopped 03/30/20 0010)  azithromycin (ZITHROMAX) 500 mg in sodium chloride 0.9 % 250 mL IVPB (0 mg Intravenous Stopped 03/30/20 0010)  insulin aspart (novoLOG) injection 10 Units (10 Units Subcutaneous Given 03/30/20 0009)    ED Course  I have reviewed the triage vital signs and the nursing notes.  Pertinent labs & imaging results that were available during my care of the patient were reviewed by me and considered in my medical decision making (see chart for details).    MDM Rules/Calculators/A&P                          59 year old lady who presented to the emergency room with concern for pneumonia.  Fever with EMS, hypoxia with EMS started on 2 L nasal cannula.   On arrival here, patient noted to have significant crackles on lung exam, mild tachypnea but not in distress.  CXR concerning for pneumonia, radiologist recommended CT scan to further assess.  CT confirmed pneumonia.  No mass.  Given her new oxygen requirement, pneumonia, believe patient would benefit from admission for IV antibiotics and observation.  Started Rocephin azithromycin.  Consult hospitalist for admission.  Dr. Yancey Flemings accepting.  Final Clinical Impression(s) / ED Diagnoses Final diagnoses:  Community acquired pneumonia, unspecified laterality  Acute respiratory failure with hypoxia [facility])    Rx / DC Orders ED Discharge Orders    None       Lucrezia Starch, MD 03/30/20 [phone]

## 2020-03-30 DIAGNOSIS — E1122 Type 2 diabetes mellitus with diabetic chronic kidney disease: Secondary | ICD-10-CM

## 2020-03-30 DIAGNOSIS — J189 Pneumonia, unspecified organism: Principal | ICD-10-CM

## 2020-03-30 DIAGNOSIS — E1165 Type 2 diabetes mellitus with hyperglycemia: Secondary | ICD-10-CM

## 2020-03-30 DIAGNOSIS — N179 Acute kidney failure, unspecified: Secondary | ICD-10-CM

## 2020-03-30 DIAGNOSIS — J9601 Acute respiratory failure with hypoxia: Secondary | ICD-10-CM

## 2020-03-30 DIAGNOSIS — E871 Hypo-osmolality and hyponatremia: Secondary | ICD-10-CM | POA: Diagnosis present

## 2020-03-30 DIAGNOSIS — I10 Essential (primary) hypertension: Secondary | ICD-10-CM

## 2020-03-30 HISTORY — DX: Acute kidney failure, unspecified: N17.9

## 2020-03-30 LAB — GLUCOSE, CAPILLARY
Glucose-Capillary: 367 mg/dL — ABNORMAL HIGH (ref 70–99)
Glucose-Capillary: 338 mg/dL — ABNORMAL HIGH (ref 70–99)
Glucose-Capillary: 321 mg/dL — ABNORMAL HIGH (ref 70–99)
Glucose-Capillary: 168 mg/dL — ABNORMAL HIGH (ref 70–99)
Glucose-Capillary: 167 mg/dL — ABNORMAL HIGH (ref 70–99)

## 2020-03-30 LAB — RENAL FUNCTION PANEL
Albumin: 3 g/dL — ABNORMAL LOW (ref 3.5–5.0)
Anion gap: 16 — ABNORMAL HIGH (ref 5–15)
BUN: 29 mg/dL — ABNORMAL HIGH (ref 6–20)
CO2: 23 mmol/L (ref 22–32)
Calcium: 8.2 mg/dL — ABNORMAL LOW (ref 8.9–10.3)
Chloride: 94 mmol/L — ABNORMAL LOW (ref 98–111)
Creatinine, Ser: 2.2 mg/dL — ABNORMAL HIGH (ref 0.44–1.00)
GFR, Estimated: 25 mL/min — ABNORMAL LOW (ref >60)
Glucose, Bld: 303 mg/dL — ABNORMAL HIGH (ref 70–99)
Phosphorus: 2.8 mg/dL (ref 2.5–4.6)
Potassium: 3.9 mmol/L (ref 3.5–5.1)
Sodium: 133 mmol/L — ABNORMAL LOW (ref 135–145)

## 2020-03-30 LAB — HEMOGLOBIN A1C
Hgb A1c MFr Bld: 9.7 % — ABNORMAL HIGH (ref 4.8–5.6)
Mean Plasma Glucose: 231.69 mg/dL

## 2020-03-30 LAB — CBC
HCT: 37.3 % (ref 36.0–46.0)
Hemoglobin: 12.1 g/dL (ref 12.0–15.0)
MCH: 28.1 pg (ref 26.0–34.0)
MCHC: 32.4 g/dL (ref 30.0–36.0)
MCV: 86.7 fL (ref 80.0–100.0)
Platelets: 219 10*3/uL (ref 150–400)
RBC: 4.3 MIL/uL (ref 3.87–5.11)
RDW: 13.9 % (ref 11.5–15.5)
WBC: 8.2 10*3/uL (ref 4.0–10.5)
nRBC: 0 % (ref 0.0–0.2)

## 2020-03-30 LAB — MAGNESIUM: Magnesium: 1.7 mg/dL (ref 1.7–2.4)

## 2020-03-30 LAB — HIV ANTIBODY (ROUTINE TESTING W REFLEX): HIV Screen 4th Generation wRfx: NONREACTIVE

## 2020-03-30 LAB — TROPONIN I (HIGH SENSITIVITY): Troponin I (High Sensitivity): 15 ng/L (ref <18)

## 2020-03-30 LAB — CBG MONITORING, ED: Glucose-Capillary: 375 mg/dL — ABNORMAL HIGH (ref 70–99)

## 2020-03-30 MED ORDER — SODIUM CHLORIDE 0.9 % IV SOLN: INTRAVENOUS | Status: DC

## 2020-03-30 MED ORDER — PRAMIPEXOLE DIHYDROCHLORIDE 0.125 MG PO TABS
0.1250 mg | ORAL_TABLET | Freq: Every evening | ORAL | Status: DC
  Administered 2020-03-30 – 2020-03-31 (×2): 0.125 mg via ORAL
  Filled 2020-03-30 (×3): qty 1

## 2020-03-30 MED ORDER — INSULIN ASPART 100 UNIT/ML ~~LOC~~ SOLN
6.0000 [IU] | Freq: Three times a day (TID) | SUBCUTANEOUS | Status: DC
  Administered 2020-03-30 – 2020-03-31 (×3): 6 [IU] via SUBCUTANEOUS

## 2020-03-30 MED ORDER — GABAPENTIN 400 MG PO CAPS
400.0000 mg | ORAL_CAPSULE | Freq: Three times a day (TID) | ORAL | Status: DC
  Administered 2020-03-30 – 2020-04-01 (×6): 400 mg via ORAL
  Filled 2020-03-30 (×6): qty 1

## 2020-03-30 MED ORDER — HYDROXYZINE HCL 50 MG PO TABS
75.0000 mg | ORAL_TABLET | Freq: Every day | ORAL | Status: DC
  Administered 2020-03-30 – 2020-04-01 (×3): 75 mg via ORAL
  Filled 2020-03-30 (×3): qty 1

## 2020-03-30 MED ORDER — INSULIN NPH (HUMAN) (ISOPHANE) 100 UNIT/ML ~~LOC~~ SUSP
30.0000 [IU] | Freq: Two times a day (BID) | SUBCUTANEOUS | Status: DC
  Administered 2020-03-30 – 2020-04-01 (×4): 30 [IU] via SUBCUTANEOUS
  Filled 2020-03-30: qty 10

## 2020-03-30 MED ORDER — INSULIN ASPART PROT & ASPART (70-30 MIX) 100 UNIT/ML ~~LOC~~ SUSP
40.0000 [IU] | Freq: Two times a day (BID) | SUBCUTANEOUS | Status: DC
  Administered 2020-03-30: 40 [IU] via SUBCUTANEOUS
  Filled 2020-03-30: qty 10

## 2020-03-30 MED ORDER — INSULIN ASPART 100 UNIT/ML ~~LOC~~ SOLN
0.0000 [IU] | Freq: Every day | SUBCUTANEOUS | Status: DC
  Administered 2020-03-31: 3 [IU] via SUBCUTANEOUS

## 2020-03-30 MED ORDER — FLUOXETINE HCL 20 MG PO CAPS
40.0000 mg | ORAL_CAPSULE | Freq: Every morning | ORAL | Status: DC
  Administered 2020-03-30 – 2020-04-01 (×3): 40 mg via ORAL
  Filled 2020-03-30 (×3): qty 2

## 2020-03-30 MED ORDER — TRAZODONE HCL 100 MG PO TABS
300.0000 mg | ORAL_TABLET | Freq: Every day | ORAL | Status: DC
  Administered 2020-03-30 – 2020-03-31 (×2): 300 mg via ORAL
  Filled 2020-03-30 (×2): qty 3

## 2020-03-30 MED ORDER — ATORVASTATIN CALCIUM 40 MG PO TABS
40.0000 mg | ORAL_TABLET | Freq: Every day | ORAL | Status: DC
  Administered 2020-03-30 – 2020-04-01 (×3): 40 mg via ORAL
  Filled 2020-03-30 (×3): qty 1

## 2020-03-30 MED ORDER — METOPROLOL SUCCINATE ER 50 MG PO TB24
50.0000 mg | ORAL_TABLET | Freq: Every day | ORAL | Status: DC
  Administered 2020-03-30 – 2020-04-01 (×3): 50 mg via ORAL
  Filled 2020-03-30 (×3): qty 1

## 2020-03-30 MED ORDER — PRAZOSIN HCL 1 MG PO CAPS
1.0000 mg | ORAL_CAPSULE | Freq: Every evening | ORAL | Status: DC
  Administered 2020-03-30 – 2020-03-31 (×2): 1 mg via ORAL
  Filled 2020-03-30 (×3): qty 1

## 2020-03-30 MED ORDER — INSULIN ASPART 100 UNIT/ML ~~LOC~~ SOLN
0.0000 [IU] | Freq: Three times a day (TID) | SUBCUTANEOUS | Status: DC
  Administered 2020-03-30 – 2020-03-31 (×2): 4 [IU] via SUBCUTANEOUS

## 2020-03-30 MED ORDER — LURASIDONE HCL 40 MG PO TABS
40.0000 mg | ORAL_TABLET | Freq: Every day | ORAL | Status: DC
  Administered 2020-03-30 – 2020-03-31 (×2): 40 mg via ORAL
  Filled 2020-03-30 (×3): qty 1

## 2020-03-30 MED ORDER — GABAPENTIN 400 MG PO CAPS
800.0000 mg | ORAL_CAPSULE | Freq: Three times a day (TID) | ORAL | Status: DC
  Administered 2020-03-30: 800 mg via ORAL
  Filled 2020-03-30: qty 2

## 2020-03-30 MED ORDER — INSULIN ASPART 100 UNIT/ML ~~LOC~~ SOLN
10.0000 [IU] | Freq: Once | SUBCUTANEOUS | Status: AC
  Administered 2020-03-30: 10 [IU] via SUBCUTANEOUS
  Filled 2020-03-30: qty 0.1

## 2020-03-30 MED ORDER — ACETAMINOPHEN 325 MG PO TABS
650.0000 mg | ORAL_TABLET | Freq: Four times a day (QID) | ORAL | Status: DC | PRN
  Administered 2020-03-30: 650 mg via ORAL
  Filled 2020-03-30: qty 2

## 2020-03-30 MED ORDER — ONDANSETRON HCL 4 MG/2ML IJ SOLN
4.0000 mg | Freq: Three times a day (TID) | INTRAMUSCULAR | Status: DC | PRN
  Administered 2020-03-30: 4 mg via INTRAVENOUS
  Filled 2020-03-30: qty 2

## 2020-03-30 NOTE — H&P (Signed)
History and Physical    Jessica Mercado ION:[id number] DOB: [birth year] DOA: 03/29/2020  PCP: Vassie Moment, MD (Confirm with patient/family/NH records and if not entered, this has to be entered at [facility] point of entry) Patient coming from: Home  I have personally briefly reviewed patient's old medical records in [place]  Chief Complaint: Cough and fever  HPI: Jessica Mercado is a 59 y.o. female with medical history significant of hypertension, diabetes mellitus, hyperlipidemia, anxiety/depression and obesity presented to ED for evaluation of fever, congestion and cough.  Patient states that her symptoms started about 3 days ago and continue to worsen.  [facility] went to [facility] ago and was diagnosed with pneumonia.  Patient was prescribed antibiotics and steroids which she did not use and her condition continue to worsen and now she is having episodic productive cough with shortness of breath.  Patient also admits of having chills but not sure of temperature.  Patient denies chest pain, palpitations, nausea, vomiting, abdominal pain and urinary symptoms.  Patient denies tobacco use but states that her family members smoke at home and she is exposed to it.  Patient was brought to the hospital by EMS who reported a temperature of 102.  (For level 3, the HPI must include 4+ descriptors: Location, Quality, Severity, Duration, Timing, Context, modifying factors, associated signs/symptoms and/or status of 3+ chronic problems.)  (Please avoid self-populating past medical history here) (The initial 2-3 lines should be focused and good to copy and paste in the HPI section of the daily progress note).  ED Course: On arrival to the hospital patient had temperature of 99.2, blood pressure 131/82, heart rate 82, respiratory rate 22 and oxygen saturation was 94% on room air which later on dropped to high 80s and patient started on 2 L of oxygen with nasal cannula.  Blood work showed WBC count of 9.9,  hemoglobin 12.4, sodium 128, potassium 4.5, BUN 32, creatinine 2.9, blood glucose 503.  CT chest was positive for bilateral lower lobe pneumonia.  Patient was managed in the ED with IV fluids and antibiotic treatment with IV ceftriaxone and IV azithromycin.  Review of Systems: As per HPI otherwise 10 point review of systems negative.  Unacceptable ROS statements: "10 systems reviewed," "Extensive" (without elaboration).  Acceptable ROS statements: "All others negative," "All others reviewed and are negative," and "All others unremarkable," with at [place] documented Can't double dip - if using for HPI can't use for ROS  Past Medical History:  Diagnosis Date  . Diabetes mellitus without complication ([place])   . Hyperlipemia   . Hypertension     Past Surgical History:  Procedure Laterality Date  . ANKLE ARTHROSCOPY WITH OPEN REDUCTION INTERNAL FIXATION ([facility])       reports that she has never smoked. She has never used smokeless tobacco. She reports previous alcohol use. She reports previous drug use.  Allergies  Allergen Reactions  . Vancomycin Other (See Comments)    Red man syndrome    No family history on file. Family history reviewed but not pertinent Unacceptable: Noncontributory, unremarkable, or negative. Acceptable: (example)Family history negative for heart disease  Prior to Admission medications   Medication Sig Start Date End Date Taking? Authorizing Provider  atorvastatin (LIPITOR) 40 MG tablet Take 40 mg by mouth daily. 12/21/17  Yes [provider]  FLUoxetine (PROZAC) 40 MG capsule Take 40 mg by mouth every morning. 09/04/18  Yes [provider]  gabapentin (NEURONTIN) 800 MG tablet Take 800  mg by mouth 3 (three) times daily. 12/21/17  Yes [provider]  glipiZIDE (GLUCOTROL) 10 MG tablet Take 10 mg by mouth 2 (two) times daily. 02/06/20  Yes [provider]  HUMALOG MIX 75/25 KWIKPEN (75-25) 100 UNIT/ML Kwikpen Inject 40 Units  into the skin 2 (two) times daily.  12/21/17  Yes [provider]  hydrOXYzine (ATARAX/VISTARIL) 25 MG tablet Take 75 mg by mouth daily.  11/06/17  Yes [provider]  LATUDA 40 MG TABS tablet Take 40 mg by mouth at bedtime. 03/27/20  Yes [provider]  metoprolol succinate (TOPROL-XL) 50 MG 24 hr tablet Take 50 mg by mouth daily. 12/21/17  Yes [provider]  OZEMPIC 0.25 or 0.5 MG/DOSE SOPN Inject 0.25 mg into the skin once a week. Take on Saturday 01/04/18  Yes [provider]  pramipexole (MIRAPEX) 0.125 MG tablet Take 0.125 mg by mouth every evening. 12/21/17  Yes [provider]  prazosin (MINIPRESS) 1 MG capsule Take 1 mg by mouth every evening. 12/22/17  Yes [provider]  silver sulfADIAZINE (SILVADENE) 1 % cream Apply 1 application topically daily.  02/06/20  Yes [provider]  traZODone (DESYREL) 100 MG tablet Take 300 mg by mouth at bedtime.  12/21/17  Yes [provider]  Vitamin D, Ergocalciferol, (DRISDOL) 50000 units CAPS capsule Take 50,000 Units by mouth every Friday. 11/06/17  Yes [provider]  cyclobenzaprine (FLEXERIL) 10 MG tablet Take 1 tablet (10 mg total) by mouth 2 (two) times daily as needed for muscle spasms. Patient not taking: Reported on 03/29/2020 11/22/17   Ashley Murrain, NP  nitroGLYCERIN (NITRODUR - DOSED IN MG/24 HR) 0.2 mg/hr patch Apply 1/4th patch to affected shoulder, change daily Patient not taking: Reported on 03/29/2020 09/05/19   Dene Gentry, MD    Physical Exam: Vitals:   03/29/20 2305 03/29/20 2330 03/30/20 0000 03/30/20 0118  BP: (!) 141/79 126/72 131/82 (!) 155/83  Pulse: 79 78 82 78  Resp: 15  (!) 22 20  Temp:    98.2 F (36.8 C)  TempSrc:    Oral  SpO2: 93% 94% 94% 95%  Weight:      Height:        Constitutional: NAD, calm, comfortable Vitals:   03/29/20 2305 03/29/20 2330 03/30/20 0000 03/30/20 0118  BP: (!) 141/79 126/72 131/82 (!) 155/83   Pulse: 79 78 82 78  Resp: 15  (!) 22 20  Temp:    98.2 F (36.8 C)  TempSrc:    Oral  SpO2: 93% 94% 94% 95%  Weight:      Height:        General: 59 year old obese Caucasian female laying comfortably in the bed and in no acute distress. Eyes: PERRL, lids and conjunctivae normal ENMT: Mucous membranes are moist. Posterior pharynx clear of any exudate or lesions.Normal dentition.  Neck: normal, supple, no masses, no thyromegaly Respiratory: Patient is on 2 L of oxygen with nasal cannula.  Diminished and coarse breath sounds in bilateral lung bases clear to auscultation bilaterally, no wheezing, no crackles. Normal respiratory effort. No accessory muscle use.  Cardiovascular: Regular rate and rhythm, no murmurs / rubs / gallops. No extremity edema. 2+ pedal pulses. No carotid bruits.  Abdomen: no tenderness, no masses palpated. No hepatosplenomegaly. Bowel sounds positive.  Musculoskeletal: no clubbing / cyanosis. No joint deformity upper and lower extremities. Good ROM, no contractures. Normal muscle tone.  Skin: Mild healed rash on bilateral lower extremities most  likely secondary to tonic venous stasis. Neurologic: CN 2-12 grossly intact. Sensation intact, DTR normal. Strength 5/5 in all 4.  Psychiatric: Normal judgment and insight. Alert and oriented x 3. Normal mood.   (Anything < 9 systems with 2 bullets each down codes to level 1) (If patient refuses exam can't bill higher level) (Make sure to document decubitus ulcers present on admission -- if possible -- and whether patient has chronic indwelling catheter at time of admission)  Labs on Admission: I have personally reviewed following labs and imaging studies  CBC: Recent Labs  Lab 03/29/20 2003 03/30/20 0139  WBC 9.9 8.2  NEUTROABS 8.0*  --   HGB 12.4 12.1  HCT 36.4 37.3  MCV 84.7 86.7  PLT 212 967   Basic Metabolic Panel: Recent Labs  Lab 03/29/20 2003  NA 128*  K 4.5  CL 94*  CO2 23  GLUCOSE 503*  BUN 32*   CREATININE 2.29*  CALCIUM 8.3*   GFR: Estimated Creatinine Clearance: 32.7 mL/min (A) (by C-G formula based on SCr of 2.29 mg/dL (H)). Liver Function Tests: No results for input(s): AST, ALT, ALKPHOS, BILITOT, PROT, ALBUMIN in the last 168 hours. No results for input(s): LIPASE, AMYLASE in the last 168 hours. No results for input(s): AMMONIA in the last 168 hours. Coagulation Profile: No results for input(s): INR, PROTIME in the last 168 hours. Cardiac Enzymes: No results for input(s): CKTOTAL, CKMB, CKMBINDEX, TROPONINI in the last 168 hours. BNP (last 3 results) No results for input(s): PROBNP in the last 8760 hours. HbA1C: No results for input(s): HGBA1C in the last 72 hours. CBG: Recent Labs  Lab 03/29/20 2346 03/30/20 0034 03/30/20 0120  GLUCAP 452* 375* 367*   Lipid Profile: No results for input(s): CHOL, HDL, LDLCALC, TRIG, CHOLHDL, LDLDIRECT in the last 72 hours. Thyroid Function Tests: No results for input(s): TSH, T4TOTAL, FREET4, T3FREE, THYROIDAB in the last 72 hours. Anemia Panel: No results for input(s): VITAMINB12, FOLATE, FERRITIN, TIBC, IRON, RETICCTPCT in the last 72 hours. Urine analysis: No results found for: COLORURINE, APPEARANCEUR, LABSPEC, [place], GLUCOSEU, HGBUR, BILIRUBINUR, KETONESUR, PROTEINUR, UROBILINOGEN, NITRITE, LEUKOCYTESUR  Radiological Exams on Admission: CT Chest Wo Contrast  Result Date: 03/29/2020 CLINICAL DATA:  Cough and fever.  Abnormal chest x-ray. EXAM: CT CHEST WITHOUT CONTRAST TECHNIQUE: Multidetector CT imaging of the chest was performed following the standard protocol without IV contrast. COMPARISON:  Radiograph earlier today. FINDINGS: Cardiovascular: Atherosclerosis of the thoracic aorta. No aortic aneurysm. There is multi chamber cardiomegaly. Coronary artery calcifications. Small pericardial effusion that measures up to 12 mm in depth adjacent to the right heart. Mediastinum/Nodes: No enlarged mediastinal lymph nodes. Limited  assessment for hilar adenopathy in the absence of IV contrast. There is no evidence of bulky hilar adenopathy or hilar mass. 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 No visualized thyroid nodule. Small hiatal hernia with distal esophageal wall thickening. Lungs/Pleura: Patchy and ground-glass opacities within both lower lobes, with a more consolidative component on the left. There may be mild tree-in-bud opacities in the upper lobes. Low lung volumes and mild motion artifact limits detailed assessment. There is mucous plugging in the left lower lobe. Trachea and central bronchi are patent. Small bilateral pleural effusions. Upper Abdomen: No acute or unexpected findings. Musculoskeletal: Thoracic spondylosis. There are no acute or suspicious osseous abnormalities. Surgical hardware in the lower cervical spine is partially included. IMPRESSION: 1. Patchy and ground-glass opacities within both lower lobes, with a more consolidative component on the left. Findings most consistent with pneumonia. There may  be mild tree-in-bud opacities in the upper lobes, bronchiolitis. 2. Small bilateral pleural effusions. 3. Cardiomegaly with small pericardial effusion. Coronary artery calcifications. 4. Small hiatal hernia with distal esophageal wall thickening, can be seen with reflux or esophagitis. 5. There is no evidence of hilar or mediastinal adenopathy on this noncontrast exam. Radiographic findings likely a combination of vascular structures and mediastinal lipomatosis. Aortic Atherosclerosis (ICD10-I70.0). Electronically Signed   By: Keith Rake M.D.   On: 03/29/2020 21:51   DG Chest Portable 1 View  Result Date: 03/29/2020 CLINICAL DATA:  59 year old with fever and recent reported diagnosis of pneumonia EXAM: PORTABLE CHEST 1 VIEW COMPARISON:  None FINDINGS: Trachea midline. Cardiomediastinal contours accentuated by low depth of expansion. Heart size likely enlarged. Hilar structures with some fullness. Thickening of  RIGHT paratracheal stripe. Airspace disease at the LEFT lung base. No sign of effusion. On limited assessment no acute skeletal process. Signs of cervical spinal fusion. IMPRESSION: 1. Airspace disease at the LEFT lung base. Atelectasis versus infection. 2. Hilar structures with increased fullness in thickening of RIGHT paratracheal stripe. Possibility of adenopathy in the chest is considered, chest CT may be helpful for further evaluation. 3. Question of variant bronchial anatomy as well, this could be further evaluated with chest CT. Electronically Signed   By: Zetta Bills M.D.   On: 03/29/2020 20:35    Assessment/Plan Principal Problem:   Pneumonia CT chest showed bilateral lower lobe pneumonia.  Continue IV ceftriaxone and IV azithromycin.  DuoNeb every 4 hours as needed for shortness of breath.  Tylenol as needed for fever.  Tessalon Perles as needed for cough.  Continue oxygen supplementation with nasal cannula to maintain oxygen saturation above 92%.  Blood cultures ordered.  Strep and Legionella antigen ordered.  Active Problems: Acute hypoxic respiratory failure Continue oxygen supplementation with nasal cannula.  Treat pneumonia    Hyperglycemia due to diabetes mellitus [facility]) Patient had a blood glucose of 503.  10 units of subcu insulin ordered.  High-dose sliding scale insulin ordered.  Blood glucose monitoring and hypoglycemic protocol in place.     AKI (acute kidney injury) ([place]) Continue gentle hydration with IV fluids.  Continue to monitor creatinine level.    Hyponatremia Hyponatremia with hypochloremia.  Continue IV fluids with normal saline and continue to monitor BMP.  Diabetic neuropathy Continue home medications  Anxiety/depression Continue home medications  Obesity Patient counseled about healthy diet and starting physical activity.  (please populate well all problems here in Problem List. (For example, if patient is on BP meds at home and you resume or decide  to hold them, it is a problem that needs to be her. Same for CAD, COPD, HLD and so on)     DVT prophylaxis: Heparin Code Status: Full code Family Communication: No family member present at the bedside Disposition Plan: Consults called:  Admission status: Inpatient/telemetry   Edmonia Lynch MD [facility] Pager [phone]   If 7PM-7AM, please contact night-coverage [email or web address] Password [email or web address]  03/30/2020, 3:09 AM

## 2020-03-30 NOTE — Plan of Care (Signed)
  Problem: Respiratory: Goal: Ability to maintain adequate ventilation will improve Outcome: Progressing   Problem: Metabolic: Goal: Ability to maintain appropriate glucose levels will improve Outcome: Progressing   Problem: Activity: Goal: Ability to tolerate increased activity will improve Outcome: Progressing   Problem: Clinical Measurements: Goal: Ability to maintain a body temperature in the normal range will improve Outcome: Progressing   Problem: Respiratory: Goal: Ability to maintain adequate ventilation will improve Outcome: Progressing Goal: Ability to maintain a clear airway will improve Outcome: Progressing   Problem: Education: Goal: Ability to describe self-care measures that may prevent or decrease complications (Diabetes Survival Skills Education) will improve Outcome: Progressing Goal: Individualized Educational Video(s) Outcome: Progressing   Problem: Coping: Goal: Ability to adjust to condition or change in health will improve Outcome: Progressing   Problem: Fluid Volume: Goal: Ability to maintain a balanced intake and output will improve Outcome: Progressing   Problem: Health Behavior/Discharge Planning: Goal: Ability to identify and utilize available resources and services will improve Outcome: Progressing Goal: Ability to manage health-related needs will improve Outcome: Progressing   Problem: Metabolic: Goal: Ability to maintain appropriate glucose levels will improve Outcome: Progressing   Problem: Nutritional: Goal: Maintenance of adequate nutrition will improve Outcome: Progressing Goal: Progress toward achieving an optimal weight will improve Outcome: Progressing   Problem: Skin Integrity: Goal: Risk for impaired skin integrity will decrease Outcome: Progressing   Problem: Tissue Perfusion: Goal: Adequacy of tissue perfusion will improve Outcome: Progressing

## 2020-03-30 NOTE — TOC Progression Note (Signed)
Transition of Care [facility]) - Progression Note    Patient Details  Name: Jessica Mercado MRN: [id number] Date of Birth: [birth year]  Transition of Care [facility]) CM/SW Contact  Purcell Mouton, RN Phone Number: 03/30/2020, 4:14 PM  Clinical Narrative:     Pt from home with family and caregivers. TOC will continue to follow.  Expected Discharge Plan: Home/Self Care Barriers to Discharge: No Barriers Identified  Expected Discharge Plan and Services Expected Discharge Plan: Home/Self Care       Living arrangements for the past 2 months: Single Family Home                                       Social Determinants of Health (SDOH) Interventions    Readmission Risk Interventions No flowsheet data found.

## 2020-03-30 NOTE — Progress Notes (Signed)
PROGRESS NOTE  Jessica Mercado GEX:[id number] DOB: Apr 17, 1961   PCP: Vassie Moment, MD  Patient is from: Home.  Uses Rollator at baseline.  DOA: 03/29/2020 LOS: 1  Chief complaints: Fever, congestion and cough  Brief Narrative / Interim history: 59 year old F with PMH of DM-2, HTN, HLD, anxiety, depression and obesity brought to ED by EMS due to progressive fever, congestion and cough for 3 days.  Reportedly diagnosed with pneumonia outpatient 2 days prior to admission and prescribed antibiotics and steroid that she did not start taking.  In ED, hemodynamically stable. RR as high as 22.  Reportedly desaturated to high 80s and started on 2 L by [place].  WBC 9.9. Na 128. Cr 2.9.  Glucose 503.  CT chest with bilateral lower lobe pneumonia.  RVP negative.  Started on IV ceftriaxone and IV azithromycin and admitted for community-acquired pneumonia.   Subjective: Seen and examined earlier this morning.  No major events overnight of this morning.  Reports some improvement in her breathing.  Saturating in lower to mid 90s on 2 L by [place].  Markedly agitated CBG.  Objective: Vitals:   03/30/20 0118 03/30/20 0529 03/30/20 0808 03/30/20 1230  BP: (!) 155/83 (!) 155/83 (!) 153/76 117/64  Pulse: 78 90 88 80  Resp: 20 18 20 18   Temp: 98.2 F (36.8 C) 99.4 F (37.4 C) 99.9 F (37.7 C) 98.9 F (37.2 C)  TempSrc: Oral Oral Oral Oral  SpO2: 95% 94% 94% 96%  Weight:      Height:        Intake/Output Summary (Last 24 hours) at 03/30/2020 1403 Last data filed at 03/30/2020 1132 Gross per 24 hour  Intake 1488.81 ml  Output --  Net 1488.81 ml   Filed Weights   03/29/20 1958  Weight: 101.2 kg    Examination:  GENERAL: No apparent distress.  Nontoxic. HEENT: MMM.  Vision and hearing grossly intact.  NECK: Supple.  No apparent JVD.  RESP: 94% on 2 L.  No IWOB.  Diminished aeration partly due to poor inspiratory effort. CVS:  RRR. Heart sounds normal.  ABD/GI/GU: BS+. Abd soft, NTND.  MSK/EXT:   Moves extremities. No apparent deformity.  Trace edema bilaterally. SKIN: no apparent skin lesion or wound NEURO: Awake, alert and oriented appropriately.  No apparent focal neuro deficit. PSYCH: Calm. Normal affect.   Procedures:  None  Microbiology summarized: [id number], influenza and RSV PCR negative. Blood cultures pending.  Assessment & Plan: Acute hypoxemic respiratory failure due to community-acquired pneumonia: Desaturated to upper 80s in ED requiring 2 L by [place] to recover.  Currently requiring 2 L by [place] to maintain saturation in the low to mid 90s. -Wean oxygen as able -Treat pneumonia as below -Incentive spirometry, OOB, PT/OT.  Community-acquired pneumonia: Presents with fever, dyspnea, cough and congestion for 3 days.  CXR with bibasilar lower lobe pneumonia.  COVID-19, influenza and RSV PCR negative.  Blood culture pending. -Continue ceftriaxone and azithromycin -Follow blood cultures. -Supplemental oxygen to maintain appropriate saturation  Uncontrolled DM-2 with hyperglycemia and diabetic neuropathy: A1c 9.7%.  CBGs elevated.  Not in DKA or HHS. Recent Labs  Lab 03/29/20 2346 03/30/20 0034 03/30/20 0120 03/30/20 0757 03/30/20 1227  GLUCAP 452* 375* 367* 338* 321*  -Continue SSI-high -Added NovoLog 6 units AC. -Change 70/32 NPH 30 units twice daily. -Further adjustment as appropriate. -Continue statin  -Reduce gabapentin to 400 mg 3 times daily-renally adjusting  AKI/azotemia on CKD-unknown stage?  Unknown baseline. Cr 1.43 in 2019> 2.29 (admit)>  2.2 -Avoid nephrotoxic meds -Continue monitoring -Continue IV fluid at 100 cc/an hour for the next 24 hours  Hyponatremia: Na 128 but corrects to normal for hyperglycemia. -Continue monitoring -Manage hyperglycemia as above.     Anxiety/depression: Stable -Continue home medications-Latuda, Prozac, trazodone,  Debility: Reportedly uses Rollator at baseline. -PT/OT eval  Class I obesity Body mass index is  34.93 kg/m.         DVT prophylaxis:  heparin injection 5,000 Units Start: 03/29/20 2330  Code Status: Full code Family Communication: Patient and/or RN.  Attempted to call patient's daughter, Wells Guiles but no answer. Status is: Inpatient  Remains inpatient appropriate because:Unsafe d/c plan, IV treatments appropriate due to intensity of illness or inability to take PO and Inpatient level of care appropriate due to severity of illness   Dispo: The patient is from: Home              Anticipated d/c is to: Home              Anticipated d/c date is: 1 day              Patient currently is not medically stable to d/c.       Consultants:  None   Sch Meds:  Scheduled Meds: . atorvastatin  40 mg Oral Daily  . FLUoxetine  40 mg Oral q morning - 10a  . gabapentin  800 mg Oral TID  . heparin  5,000 Units Subcutaneous Q8H  . hydrOXYzine  75 mg Oral Daily  . insulin aspart  0-20 Units Subcutaneous TID WC  . insulin aspart  0-5 Units Subcutaneous QHS  . insulin aspart  6 Units Subcutaneous TID WC  . insulin NPH Human  30 Units Subcutaneous BID AC & HS  . lurasidone  40 mg Oral QHS  . metoprolol succinate  50 mg Oral Daily  . pramipexole  0.125 mg Oral QPM  . prazosin  1 mg Oral QPM  . traZODone  300 mg Oral QHS   Continuous Infusions: . azithromycin    . cefTRIAXone (ROCEPHIN)  IV     PRN Meds:.benzonatate, ipratropium-albuterol, ondansetron (ZOFRAN) IV  Antimicrobials: Anti-infectives (From admission, onward)   Start     Dose/Rate Route Frequency Ordered Stop   03/30/20 2200  azithromycin (ZITHROMAX) 500 mg in sodium chloride 0.9 % 250 mL IVPB       Note to Pharmacy: Pharmacy to adjust timing for next dose   500 mg 250 mL/hr over 60 Minutes Intravenous Every 24 hours 03/29/20 2329     03/30/20 2200  cefTRIAXone (ROCEPHIN) 1 g in sodium chloride 0.9 % 100 mL IVPB       Note to Pharmacy: Pharmacy to adjust timing for next dose   1 g 200 mL/hr over 30 Minutes  Intravenous Every 24 hours 03/29/20 2329     03/29/20 2300  cefTRIAXone (ROCEPHIN) 1 g in sodium chloride 0.9 % 100 mL IVPB        1 g 200 mL/hr over 30 Minutes Intravenous  Once 03/29/20 2249 03/30/20 0010   03/29/20 2300  azithromycin (ZITHROMAX) 500 mg in sodium chloride 0.9 % 250 mL IVPB        500 mg 250 mL/hr over 60 Minutes Intravenous  Once 03/29/20 2249 03/30/20 0010       I have personally reviewed the following labs and images: CBC: Recent Labs  Lab 03/29/20 2003 03/30/20 0139  WBC 9.9 8.2  NEUTROABS 8.0*  --   HGB 12.4  12.1  HCT 36.4 37.3  MCV 84.7 86.7  PLT 212 219   BMP &GFR Recent Labs  Lab 03/29/20 2003 03/30/20 0451  NA 128* 133*  K 4.5 3.9  CL 94* 94*  CO2 23 23  GLUCOSE 503* 303*  BUN 32* 29*  CREATININE 2.29* 2.20*  CALCIUM 8.3* 8.2*  MG  --  1.7  PHOS  --  2.8   Estimated Creatinine Clearance: 34.1 mL/min (A) (by C-G formula based on SCr of 2.2 mg/dL (H)). Liver & Pancreas: Recent Labs  Lab 03/30/20 0451  ALBUMIN 3.0*   No results for input(s): LIPASE, AMYLASE in the last 168 hours. No results for input(s): AMMONIA in the last 168 hours. Diabetic: Recent Labs    03/30/20 0139  HGBA1C 9.7*   Recent Labs  Lab 03/29/20 2346 03/30/20 0034 03/30/20 0120 03/30/20 0757 03/30/20 1227  GLUCAP 452* 375* 367* 338* 321*   Cardiac Enzymes: No results for input(s): CKTOTAL, CKMB, CKMBINDEX, TROPONINI in the last 168 hours. No results for input(s): PROBNP in the last 8760 hours. Coagulation Profile: No results for input(s): INR, PROTIME in the last 168 hours. Thyroid Function Tests: No results for input(s): TSH, T4TOTAL, FREET4, T3FREE, THYROIDAB in the last 72 hours. Lipid Profile: No results for input(s): CHOL, HDL, LDLCALC, TRIG, CHOLHDL, LDLDIRECT in the last 72 hours. Anemia Panel: No results for input(s): VITAMINB12, FOLATE, FERRITIN, TIBC, IRON, RETICCTPCT in the last 72 hours. Urine analysis: No results found for: COLORURINE,  APPEARANCEUR, LABSPEC, PHURINE, GLUCOSEU, HGBUR, BILIRUBINUR, KETONESUR, PROTEINUR, UROBILINOGEN, NITRITE, LEUKOCYTESUR Sepsis Labs: Invalid input(s): PROCALCITONIN, [place]  Microbiology: Recent Results (from the past 240 hour(s))  Respiratory Panel by RT PCR (Flu A&B, Covid) - Nasopharyngeal Swab     Status: None   Collection Time: 03/29/20  8:10 PM   Specimen: Nasopharyngeal Swab  Result Value Ref Range Status   SARS Coronavirus 2 by RT PCR NEGATIVE NEGATIVE Final    Comment: (NOTE) SARS-CoV-2 target nucleic acids are NOT DETECTED.  The SARS-CoV-2 RNA is generally detectable in upper respiratoy specimens during the acute phase of infection. The lowest concentration of SARS-CoV-2 viral copies this assay can detect is 131 copies/mL. A negative result does not preclude SARS-Cov-2 infection and should not be used as the sole basis for treatment or other patient management decisions. A negative result may occur with  improper specimen collection/handling, submission of specimen other than nasopharyngeal swab, presence of viral mutation(s) within the areas targeted by this assay, and inadequate number of viral copies (<131 copies/mL). A negative result must be combined with clinical observations, patient history, and epidemiological information. The expected result is Negative.  Fact Sheet for Patients:  PinkCheek.be  Fact Sheet for Healthcare Providers:  GravelBags.it  This test is no t yet approved or cleared by the [place] FDA and  has been authorized for detection and/or diagnosis of SARS-CoV-2 by FDA under an Emergency Use Authorization (EUA). This EUA will remain  in effect (meaning this test can be used) for the duration of the COVID-19 declaration under Section 564(b)(1) of the Act, 21 U.S.C. section 360bbb-3(b)(1), unless the authorization is terminated or revoked sooner.     Influenza A by PCR NEGATIVE  NEGATIVE Final   Influenza B by PCR NEGATIVE NEGATIVE Final    Comment: (NOTE) The Xpert Xpress SARS-CoV-2/FLU/RSV assay is intended as an aid in  the diagnosis of influenza from Nasopharyngeal swab specimens and  should not be used as a sole basis for treatment. Nasal washings and  aspirates are unacceptable for Xpert Xpress SARS-CoV-2/FLU/RSV  testing.  Fact Sheet for Patients: PinkCheek.be  Fact Sheet for Healthcare Providers: GravelBags.it  This test is not yet approved or cleared by the [place] FDA and  has been authorized for detection and/or diagnosis of SARS-CoV-2 by  FDA under an Emergency Use Authorization (EUA). This EUA will remain  in effect (meaning this test can be used) for the duration of the  Covid-19 declaration under Section 564(b)(1) of the Act, 21  U.S.C. section 360bbb-3(b)(1), unless the authorization is  terminated or revoked. Performed at [facility], [place] [address] [place], [place] [address]     Radiology Studies: CT Chest Wo Contrast  Result Date: 03/29/2020 CLINICAL DATA:  Cough and fever.  Abnormal chest x-ray. EXAM: CT CHEST WITHOUT CONTRAST TECHNIQUE: Multidetector CT imaging of the chest was performed following the standard protocol without IV contrast. COMPARISON:  Radiograph earlier today. FINDINGS: Cardiovascular: Atherosclerosis of the thoracic aorta. No aortic aneurysm. There is multi chamber cardiomegaly. Coronary artery calcifications. Small pericardial effusion that measures up to 12 mm in depth adjacent to the right heart. Mediastinum/Nodes: No enlarged mediastinal lymph nodes. Limited assessment for hilar adenopathy in the absence of IV contrast. There is no evidence of bulky hilar adenopathy or hilar mass. 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 No visualized thyroid nodule. Small hiatal hernia with distal esophageal wall thickening. Lungs/Pleura: Patchy and  ground-glass opacities within both lower lobes, with a more consolidative component on the left. There may be mild tree-in-bud opacities in the upper lobes. Low lung volumes and mild motion artifact limits detailed assessment. There is mucous plugging in the left lower lobe. Trachea and central bronchi are patent. Small bilateral pleural effusions. Upper Abdomen: No acute or unexpected findings. Musculoskeletal: Thoracic spondylosis. There are no acute or suspicious osseous abnormalities. Surgical hardware in the lower cervical spine is partially included. IMPRESSION: 1. Patchy and ground-glass opacities within both lower lobes, with a more consolidative component on the left. Findings most consistent with pneumonia. There may be mild tree-in-bud opacities in the upper lobes, bronchiolitis. 2. Small bilateral pleural effusions. 3. Cardiomegaly with small pericardial effusion. Coronary artery calcifications. 4. Small hiatal hernia with distal esophageal wall thickening, can be seen with reflux or esophagitis. 5. There is no evidence of hilar or mediastinal adenopathy on this noncontrast exam. Radiographic findings likely a combination of vascular structures and mediastinal lipomatosis. Aortic Atherosclerosis (ICD10-I70.0). Electronically Signed   By: Keith Rake M.D.   On: 03/29/2020 21:51   DG Chest Portable 1 View  Result Date: 03/29/2020 CLINICAL DATA:  59 year old with fever and recent reported diagnosis of pneumonia EXAM: PORTABLE CHEST 1 VIEW COMPARISON:  None FINDINGS: Trachea midline. Cardiomediastinal contours accentuated by low depth of expansion. Heart size likely enlarged. Hilar structures with some fullness. Thickening of RIGHT paratracheal stripe. Airspace disease at the LEFT lung base. No sign of effusion. On limited assessment no acute skeletal process. Signs of cervical spinal fusion. IMPRESSION: 1. Airspace disease at the LEFT lung base. Atelectasis versus infection. 2. Hilar structures  with increased fullness in thickening of RIGHT paratracheal stripe. Possibility of adenopathy in the chest is considered, chest CT may be helpful for further evaluation. 3. Question of variant bronchial anatomy as well, this could be further evaluated with chest CT. Electronically Signed   By: Zetta Bills M.D.   On: 03/29/2020 20:35     Sajan Cheatwood T. [place]  If 7PM-7AM, please contact night-coverage [email or web address] 03/30/2020, 2:03 PM

## 2020-03-31 ENCOUNTER — Inpatient Hospital Stay (HOSPITAL_COMMUNITY): Payer: Medicaid Other

## 2020-03-31 DIAGNOSIS — R5381 Other malaise: Secondary | ICD-10-CM

## 2020-03-31 LAB — RENAL FUNCTION PANEL
Albumin: 2.6 g/dL — ABNORMAL LOW (ref 3.5–5.0)
Anion gap: 11 (ref 5–15)
BUN: 27 mg/dL — ABNORMAL HIGH (ref 6–20)
CO2: 25 mmol/L (ref 22–32)
Calcium: 8.2 mg/dL — ABNORMAL LOW (ref 8.9–10.3)
Chloride: 101 mmol/L (ref 98–111)
Creatinine, Ser: 2.19 mg/dL — ABNORMAL HIGH (ref 0.44–1.00)
GFR, Estimated: 25 mL/min — ABNORMAL LOW (ref >60)
Glucose, Bld: 93 mg/dL (ref 70–99)
Phosphorus: 3.4 mg/dL (ref 2.5–4.6)
Potassium: 3.6 mmol/L (ref 3.5–5.1)
Sodium: 137 mmol/L (ref 135–145)

## 2020-03-31 LAB — URINALYSIS, ROUTINE W REFLEX MICROSCOPIC
Bacteria, UA: NONE SEEN
Bilirubin Urine: NEGATIVE
Glucose, UA: NEGATIVE mg/dL
Ketones, ur: NEGATIVE mg/dL
Nitrite: NEGATIVE
Protein, ur: 100 mg/dL — AB
Specific Gravity, Urine: 1.013 (ref 1.005–1.030)
pH: 5 (ref 5.0–8.0)

## 2020-03-31 LAB — GLUCOSE, CAPILLARY
Glucose-Capillary: 95 mg/dL (ref 70–99)
Glucose-Capillary: 196 mg/dL — ABNORMAL HIGH (ref 70–99)
Glucose-Capillary: 125 mg/dL — ABNORMAL HIGH (ref 70–99)
Glucose-Capillary: 46 mg/dL — ABNORMAL LOW (ref 70–99)
Glucose-Capillary: 255 mg/dL — ABNORMAL HIGH (ref 70–99)

## 2020-03-31 LAB — MAGNESIUM: Magnesium: 1.7 mg/dL (ref 1.7–2.4)

## 2020-03-31 MED ORDER — DEXTROSE 50 % IV SOLN
INTRAVENOUS | Status: AC
  Administered 2020-03-31: 50 mL
  Filled 2020-03-31: qty 50

## 2020-03-31 MED ORDER — DEXTROSE 50 % IV SOLN: 25.0000 mL | Freq: Once | INTRAVENOUS | Status: DC

## 2020-03-31 NOTE — Progress Notes (Signed)
PROGRESS NOTE  [facility] ULA:[id number] DOB: 06/14/1960   PCP: Vassie Moment, MD  Patient is from: Home.  Uses Rollator at baseline.  DOA: 03/29/2020 LOS: 2  Chief complaints: Fever, congestion and cough  Brief Narrative / Interim history: 59 year old F with PMH of DM-2, HTN, HLD, anxiety, depression and obesity brought to ED by EMS due to progressive fever, congestion and cough for 3 days.  Reportedly diagnosed with pneumonia outpatient 2 days prior to admission and prescribed antibiotics and steroid that she did not start taking.  In ED, hemodynamically stable. RR as high as 22.  Reportedly desaturated to high 80s and started on 2 L by [place].  WBC 9.9. Na 128. Cr 2.9.  Glucose 503.  CT chest with bilateral lower lobe pneumonia.  RVP negative.  Started on IV ceftriaxone and IV azithromycin and admitted for community-acquired pneumonia.   Subjective: Seen and examined earlier this morning.  Spiked fever to 101.3 earlier last night about 9 PM.  She feels better this morning.  She is anxious about her oxygen level dropping to 91% at rest when she was taken off oxygen.  Reports intermittent cough with whitish phlegm.  Denies GI or UTI symptoms.  Objective: Vitals:   03/31/20 0005 03/31/20 0524 03/31/20 0740 03/31/20 0757  BP: 123/68 (!) 141/80 (!) 153/78   Pulse: 70 63 70   Resp: 20 20 20    Temp: 98.2 F (36.8 C) 97.8 F (36.6 C) 97.8 F (36.6 C)   TempSrc: Oral Oral    SpO2: 95% 95% 92% 91%  Weight:      Height:        Intake/Output Summary (Last 24 hours) at 03/31/2020 1237 Last data filed at 03/31/2020 2122 Gross per 24 hour  Intake 2378.13 ml  Output 2450 ml  Net -71.87 ml   Filed Weights   03/29/20 1958  Weight: 101.2 kg    Examination:  GENERAL: No apparent distress.  Nontoxic. HEENT: MMM.  Vision and hearing grossly intact.  NECK: Supple.  No apparent JVD.  RESP: 91% on RA at rest.  No IWOB.  Diminished aeration bilaterally. CVS:  RRR. Heart sounds normal.   ABD/GI/GU: BS+. Abd soft, NTND.  MSK/EXT:  Moves extremities. No apparent deformity.  Trace to 1+ edema bilaterally. SKIN: no apparent skin lesion or wound NEURO: Awake, alert and oriented appropriately.  No apparent focal neuro deficit. PSYCH: Calm. Normal affect.   Procedures:  None  Microbiology summarized: QMGNO-03, influenza and RSV PCR negative. Blood cultures pending.  Assessment & Plan: Acute hypoxemic respiratory failure due to community-acquired pneumonia: Desaturated to upper 80s in ED requiring 2 L by [place] to recover.  Currently saturating about 91% on RA at rest.  -Ambulatory saturation assessment -Incentive spirometry, OOB, PT/OT. -Treat pneumonia as below.  Community-acquired pneumonia:  CXR with bibasilar lower lobe pneumonia.  COVID-19, influenza and RSV PCR negative.  Blood culture NGTD. -Continue ceftriaxone and azithromycin -Wean oxygen as able.  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  Not in DKA or HHS. Recent Labs  Lab 03/30/20 1227 03/30/20 1634 03/30/20 2023 03/31/20 0741 03/31/20 1137  GLUCAP 321* 168* 167* 95 196*  -Continue SSI-high -Continue NovoLog 6 units AC -Continue Novolin N 30 units twice daily. -Further adjustment as appropriate. -Reduced gabapentin to 400 mg 3 times daily-renally adjusting  AKI/azotemia on CKD-unknown stage? Unknown baseline.  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  Cr 1.43 in 2019> 2.29 (admit)> 2.2> 2.19.  May be at baseline is about 2.2. -Avoid  nephrotoxic meds -Discontinue IV fluid.  She has bilateral lower extremity edema. -Check urinalysis and renal ultrasound. -Need outpatient follow-up with nephrology  Hyponatremia: Na 128 but corrects to normal for hyperglycemia.  Resolved. -Continue monitoring    Anxiety/depression: Stable -Continue home medications-Latuda, Prozac, trazodone,  Debility: Reportedly uses Rollator at baseline. -OOB/PT/OT eval  Class I obesity Body mass index is 34.93 kg/m.          DVT prophylaxis:  heparin injection 5,000 Units Start: 03/29/20 2330  Code Status: Full code Family Communication: Updated patient's daughter over the phone. Status is: Inpatient  Remains inpatient appropriate because:Unsafe d/c plan, IV treatments appropriate due to intensity of illness or inability to take PO and Inpatient level of care appropriate due to severity of illness   Dispo: The patient is from: Home              Anticipated d/c is to: Home              Anticipated d/c date is: 1 day              Patient currently is not medically stable to d/c.       Consultants:  None   Sch Meds:  Scheduled Meds: . atorvastatin  40 mg Oral Daily  . FLUoxetine  40 mg Oral q morning - 10a  . gabapentin  400 mg Oral TID  . heparin  5,000 Units Subcutaneous Q8H  . hydrOXYzine  75 mg Oral Daily  . insulin aspart  0-20 Units Subcutaneous TID WC  . insulin aspart  0-5 Units Subcutaneous QHS  . insulin aspart  6 Units Subcutaneous TID WC  . insulin NPH Human  30 Units Subcutaneous BID AC & HS  . lurasidone  40 mg Oral QHS  . metoprolol succinate  50 mg Oral Daily  . pramipexole  0.125 mg Oral QPM  . prazosin  1 mg Oral QPM  . traZODone  300 mg Oral QHS   Continuous Infusions: . sodium chloride 100 mL/hr at 03/31/20 0304  . azithromycin 500 mg (03/30/20 2029)  . cefTRIAXone (ROCEPHIN)  IV 1 g (03/30/20 2032)   PRN Meds:.acetaminophen, benzonatate, ipratropium-albuterol, ondansetron (ZOFRAN) IV  Antimicrobials: Anti-infectives (From admission, onward)   Start     Dose/Rate Route Frequency Ordered Stop   03/30/20 2200  azithromycin (ZITHROMAX) 500 mg in sodium chloride 0.9 % 250 mL IVPB       Note to Pharmacy: Pharmacy to adjust timing for next dose   500 mg 250 mL/hr over 60 Minutes Intravenous Every 24 hours 03/29/20 2329     03/30/20 2200  cefTRIAXone (ROCEPHIN) 1 g in sodium chloride 0.9 % 100 mL IVPB       Note to Pharmacy: Pharmacy to adjust timing for next dose    1 g 200 mL/hr over 30 Minutes Intravenous Every 24 hours 03/29/20 2329     03/29/20 2300  cefTRIAXone (ROCEPHIN) 1 g in sodium chloride 0.9 % 100 mL IVPB        1 g 200 mL/hr over 30 Minutes Intravenous  Once 03/29/20 2249 03/30/20 0010   03/29/20 2300  azithromycin (ZITHROMAX) 500 mg in sodium chloride 0.9 % 250 mL IVPB        500 mg 250 mL/hr over 60 Minutes Intravenous  Once 03/29/20 2249 03/30/20 0010       I have personally reviewed the following labs and images: CBC: Recent Labs  Lab 03/29/20 2003 03/30/20 0139  WBC 9.9 8.2  NEUTROABS 8.0*  --   HGB 12.4 12.1  HCT 36.4 37.3  MCV 84.7 86.7  PLT 212 219   BMP &GFR Recent Labs  Lab 03/29/20 2003 03/30/20 0451 03/31/20 0603  NA 128* 133* 137  K 4.5 3.9 3.6  CL 94* 94* 101  CO2 23 23 25   GLUCOSE 503* 303* 93  BUN 32* 29* 27*  CREATININE 2.29* 2.20* 2.19*  CALCIUM 8.3* 8.2* 8.2*  MG  --  1.7 1.7  PHOS  --  2.8 3.4   Estimated Creatinine Clearance: 34.2 mL/min (A) (by C-G formula based on SCr of 2.19 mg/dL (H)). Liver & Pancreas: Recent Labs  Lab 03/30/20 0451 03/31/20 0603  ALBUMIN 3.0* 2.6*   No results for input(s): LIPASE, AMYLASE in the last 168 hours. No results for input(s): AMMONIA in the last 168 hours. Diabetic: Recent Labs    03/30/20 0139  HGBA1C 9.7*   Recent Labs  Lab 03/30/20 1227 03/30/20 1634 03/30/20 2023 03/31/20 0741 03/31/20 1137  GLUCAP 321* 168* 167* 95 196*   Cardiac Enzymes: No results for input(s): CKTOTAL, CKMB, CKMBINDEX, TROPONINI in the last 168 hours. No results for input(s): PROBNP in the last 8760 hours. Coagulation Profile: No results for input(s): INR, PROTIME in the last 168 hours. Thyroid Function Tests: No results for input(s): TSH, T4TOTAL, FREET4, T3FREE, THYROIDAB in the last 72 hours. Lipid Profile: No results for input(s): CHOL, HDL, LDLCALC, TRIG, CHOLHDL, LDLDIRECT in the last 72 hours. Anemia Panel: No results for input(s): VITAMINB12, FOLATE,  FERRITIN, TIBC, IRON, RETICCTPCT in the last 72 hours. Urine analysis: No results found for: COLORURINE, APPEARANCEUR, LABSPEC, PHURINE, GLUCOSEU, HGBUR, BILIRUBINUR, KETONESUR, PROTEINUR, UROBILINOGEN, NITRITE, LEUKOCYTESUR Sepsis Labs: Invalid input(s): PROCALCITONIN, [place]  Microbiology: Recent Results (from the past 240 hour(s))  Respiratory Panel by RT PCR (Flu A&B, Covid) - Nasopharyngeal Swab     Status: None   Collection Time: 03/29/20  8:10 PM   Specimen: Nasopharyngeal Swab  Result Value Ref Range Status   SARS Coronavirus 2 by RT PCR NEGATIVE NEGATIVE Final    Comment: (NOTE) SARS-CoV-2 target nucleic acids are NOT DETECTED.  The SARS-CoV-2 RNA is generally detectable in upper respiratoy specimens during the acute phase of infection. The lowest concentration of SARS-CoV-2 viral copies this assay can detect is 131 copies/mL. A negative result does not preclude SARS-Cov-2 infection and should not be used as the sole basis for treatment or other patient management decisions. A negative result may occur with  improper specimen collection/handling, submission of specimen other than nasopharyngeal swab, presence of viral mutation(s) within the areas targeted by this assay, and inadequate number of viral copies (<131 copies/mL). A negative result must be combined with clinical observations, patient history, and epidemiological information. The expected result is Negative.  Fact Sheet for Patients:  PinkCheek.be  Fact Sheet for Healthcare Providers:  GravelBags.it  This test is no t yet approved or cleared by the [place] FDA and  has been authorized for detection and/or diagnosis of SARS-CoV-2 by FDA under an Emergency Use Authorization (EUA). This EUA will remain  in effect (meaning this test can be used) for the duration of the COVID-19 declaration under Section 564(b)(1) of the Act, 21 U.S.C. section  360bbb-3(b)(1), unless the authorization is terminated or revoked sooner.     Influenza A by PCR NEGATIVE NEGATIVE Final   Influenza B by PCR NEGATIVE NEGATIVE Final    Comment: (NOTE) The Xpert Xpress SARS-CoV-2/FLU/RSV assay is intended as an aid in  the  diagnosis of influenza from Nasopharyngeal swab specimens and  should not be used as a sole basis for treatment. Nasal washings and  aspirates are unacceptable for Xpert Xpress SARS-CoV-2/FLU/RSV  testing.  Fact Sheet for Patients: PinkCheek.be  Fact Sheet for Healthcare Providers: GravelBags.it  This test is not yet approved or cleared by the [place] FDA and  has been authorized for detection and/or diagnosis of SARS-CoV-2 by  FDA under an Emergency Use Authorization (EUA). This EUA will remain  in effect (meaning this test can be used) for the duration of the  Covid-19 declaration under Section 564(b)(1) of the Act, 21  U.S.C. section 360bbb-3(b)(1), unless the authorization is  terminated or revoked. Performed at [facility], [place] [address], [place], [place] [address]   Culture, blood (routine x 2)     Status: None (Preliminary result)   Collection Time: 03/30/20  4:51 AM   Specimen: BLOOD LEFT HAND  Result Value Ref Range Status   Specimen Description   Final    BLOOD LEFT HAND Performed at [facility], [place] [address] [place], [place] [address]    Special Requests   Final    BOTTLES DRAWN AEROBIC ONLY Blood Culture adequate volume Performed at [place] [address], [place], [place] [address]    Culture   Final    NO GROWTH 1 DAY Performed at [facility], [place] [address], [place], [place] [address]    Report Status PENDING  Incomplete  Culture, blood (routine x 2)     Status: None (Preliminary result)   Collection Time: 03/30/20  4:58 AM   Specimen: BLOOD  Result Value Ref Range Status    Specimen Description   Final    BLOOD LEFT ANTECUBITAL Performed at [place] [address], [place], [place] [address]    Special Requests   Final    BOTTLES DRAWN AEROBIC ONLY Blood Culture adequate volume Performed at [place] [address] [place], [place] [address]    Culture   Final    NO GROWTH 1 DAY Performed at [facility], [place] [address], [place], [place] [address]    Report Status PENDING  Incomplete    Radiology Studies: No results found.   [place] Dohmen T. Lorena  If 7PM-7AM, please contact night-coverage [email or web address] 03/31/2020, 12:37 PM

## 2020-03-31 NOTE — Evaluation (Signed)
Occupational Therapy Evaluation Patient Details Name: Jessica Mercado MRN: [id number] DOB: 1960/09/13 Today's Date: 03/31/2020    History of Present Illness 59 year old F with PMH of DM-2, HTN, HLD, anxiety, depression and obesity brought to ED by EMS due to progressive fever, congestion and cough for 3 days. Admitted for acute respiratory failure secondary to pneumonia.   Clinical Impression   [name] is a 59 year old woman who demonstrates functional upper body strength, ability to perform baseline mobility with RW and ADLs - requiring increased time, stabilization with upper extremities and seated positioning. Patient reports roommate/caregiver at home and already having DME. Patient's o2 sat 94% with activity on RA. No OT needs at this time.    Follow Up Recommendations  No OT follow up    Equipment Recommendations  None recommended by OT    Recommendations for Other Services       Precautions / Restrictions Precautions Precautions: None      Mobility Bed Mobility               General bed mobility comments: up in chair.    Transfers Overall transfer level: Modified independent Equipment used: Rolling walker (2 wheeled)             General transfer comment: Modified independent with RW to amblate in room. After ambulating to bathroom and back to chair o2 sat 94%.    Balance Overall balance assessment: Mild deficits observed, not formally tested                                         ADL either performed or assessed with clinical judgement   ADL Overall ADL's : At baseline                                       General ADL Comments: Demonstrates ability to perform dressing, toileting, and functional mobility using RW at her prior level.     Vision Baseline Vision/History: Wears glasses Wears Glasses: At all times       Perception     Praxis      Pertinent Vitals/Pain Pain Assessment: No/denies pain      Hand Dominance Right   Extremity/Trunk Assessment Upper Extremity Assessment Upper Extremity Assessment: Overall WFL for tasks assessed   Lower Extremity Assessment Lower Extremity Assessment: Defer to PT evaluation   Cervical / Trunk Assessment Cervical / Trunk Assessment: Kyphotic   Communication Communication Communication: No difficulties   Cognition Arousal/Alertness: Awake/alert Behavior During Therapy: WFL for tasks assessed/performed Overall Cognitive Status: Within Functional Limits for tasks assessed                                     General Comments       Exercises     Shoulder Instructions      Home Living Family/patient expects to be discharged to:: Private residence Living Arrangements: Other (Comment) (roommate) Available Help at Discharge: Available PRN/intermittently;Friend(s) (roommate works during the day) Type of Home: House Home Access: Level entry     Home Layout: One level     Bathroom Shower/Tub: Corporate investment banker: Standard     Home Equipment: Environmental consultant - 2 wheels;Shower seat  Prior Functioning/Environment Level of Independence: Independent with assistive device(s)        Comments: Needs increased time. Needs her shoes with a shoe lift for ambulation. Furniture surfs in home and uses RW when out.        OT Problem List:        OT Treatment/Interventions:      OT Goals(Current goals can be found in the care plan section) Acute Rehab OT Goals OT Goal Formulation: All assessment and education complete, DC therapy  OT Frequency:     Barriers to D/C:            Co-evaluation              AM-PAC OT "6 Clicks" Daily Activity     Outcome Measure Help from another person eating meals?: None Help from another person taking care of personal grooming?: None Help from another person toileting, which includes using toliet, bedpan, or urinal?: None Help from another  person bathing (including washing, rinsing, drying)?: None Help from another person to put on and taking off regular upper body clothing?: None Help from another person to put on and taking off regular lower body clothing?: None 6 Click Score: 24   End of Session Equipment Utilized During Treatment: Gait belt;Rolling walker Nurse Communication: Mobility status  Activity Tolerance: Patient tolerated treatment well Patient left: in chair;with call bell/phone within reach;with chair alarm set                   Time: 7290-2111 OT Time Calculation (min): 21 min Charges:  OT General Charges $OT Visit: 1 Visit OT Evaluation $OT Eval Low Complexity: 1 Low  Rejoice Heatwole, OTR/L [place]  Office [phone] Pager: [phone]   Lenward Chancellor 03/31/2020, 1:23 PM

## 2020-03-31 NOTE — Evaluation (Signed)
Physical Therapy 1 x Evaluation Patient Details Name: Jessica Mercado MRN: [id number] DOB: 1960-06-29 Today's Date: 03/31/2020   History of Present Illness  59 year old F with PMH of DM-2, HTN, HLD, anxiety, depression and obesity brought to ED by EMS due to progressive fever, congestion and cough for 3 days. Admitted for acute respiratory failure secondary to pneumonia.  Clinical Impression  Pt tolerated session well today. Ambulated with 4 wheeled RW as she does at home at baseline. Pt states she feels better. Her O2 sats at RA started at 100% and went down to 95 % with ambulation, very little SOB today however with a "gunky" cough and encouraged to continue coughing to keep that moving.   Left 4 wheeled RW in her room and encouraged pt to ambulated again today with nursing. No further skilled PT needed , to sign off.     Follow Up Recommendations No PT follow up    Equipment Recommendations  None recommended by PT    Recommendations for Other Services       Precautions / Restrictions Precautions Precautions: None      Mobility  Bed Mobility               General bed mobility comments: up in chair.    Transfers Overall transfer level: Modified independent Equipment used: 4-wheeled walker             General transfer comment: Modified independent with RW to amblate in room. After ambulating to bathroom and back to chair o2 sat 94%.  Ambulation/Gait Ambulation/Gait assistance: Supervision Gait Distance (Feet): 200 Feet Assistive device: 4-wheeled walker Gait Pattern/deviations: Step-through pattern Gait velocity: good pace   General Gait Details: step through pattern. 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 not formally assessed but no LOB with all mobility during session)                                           Pertinent Vitals/Pain Pain Assessment: No/denies pain    Home Living Family/patient expects to be discharged to:: Private residence Living Arrangements: Other (Comment) (roommate) Available Help at Discharge: Available PRN/intermittently;Friend(s) (roommate works during the day) Type of Home: House Home Access: Level entry     Home Layout: One [place]: Environmental consultant - 2 wheels;Shower seat;Walker - 4 wheels      Prior Function Level of Independence: Independent with assistive device(s)         Comments: Needs increased time. Needs her shoes with a shoe lift for ambulation. Furniture surfs in home and uses RW ( rollator with 4 wheels) when out.     Hand Dominance   Dominant Hand: Right    Extremity/Trunk Assessment   Upper Extremity Assessment Upper Extremity Assessment: Overall WFL for tasks assessed    Lower Extremity Assessment Lower Extremity Assessment: Overall WFL for tasks assessed    Cervical / Trunk Assessment Cervical / Trunk Assessment: Kyphotic  Communication   Communication: No difficulties  Cognition Arousal/Alertness:  Awake/alert Behavior During Therapy: WFL for tasks assessed/performed Overall Cognitive Status: Within Functional Limits for tasks assessed                                        General Comments      Exercises     Assessment/Plan    PT Assessment Patent does not need any further PT services  PT Problem List         PT Treatment Interventions      PT Goals (Current goals can be found in the Care Plan section)  Acute Rehab PT Goals Patient Stated Goal: I want to get better and feel better today . This is working. PT Goal Formulation: All assessment and education complete, DC therapy     Frequency     Barriers to discharge        Co-evaluation               AM-PAC PT "6 Clicks" Mobility  Outcome Measure Help needed turning from your back to your side while in a flat bed without using bedrails?: None Help needed moving from lying on your back to sitting on the side of a flat bed without using bedrails?: None Help needed moving to and from a bed to a chair (including a wheelchair)?: None Help needed standing up from a chair using your arms (e.g., wheelchair or bedside chair)?: None Help needed to walk in hospital room?: None Help needed climbing 3-5 steps with a railing? : A Little 6 Click Score: 23    End of Session Equipment Utilized During Treatment: Gait belt Activity Tolerance: Patient tolerated treatment well Patient left: in chair;with chair alarm set Nurse Communication: Mobility status PT Visit Diagnosis: Other abnormalities of gait and mobility (R26.89)    Time: 1115-1140 PT Time Calculation (min) (ACUTE ONLY): 25 min   Charges:   PT Evaluation $PT Eval Low Complexity: 1 Low PT Treatments $Gait Training: 8-22 mins        Georganna Maxson, PT, MPT Acute Rehabilitation Services Office: [phone] Pager: [phone] 03/31/2020   Clide Dales 03/31/2020, 1:54 PM

## 2020-04-01 DIAGNOSIS — J9621 Acute and chronic respiratory failure with hypoxia: Secondary | ICD-10-CM

## 2020-04-01 DIAGNOSIS — F32A Depression, unspecified: Secondary | ICD-10-CM

## 2020-04-01 DIAGNOSIS — F419 Anxiety disorder, unspecified: Secondary | ICD-10-CM

## 2020-04-01 LAB — RENAL FUNCTION PANEL
Albumin: 2.5 g/dL — ABNORMAL LOW (ref 3.5–5.0)
Anion gap: 8 (ref 5–15)
BUN: 22 mg/dL — ABNORMAL HIGH (ref 6–20)
CO2: 25 mmol/L (ref 22–32)
Calcium: 8 mg/dL — ABNORMAL LOW (ref 8.9–10.3)
Chloride: 104 mmol/L (ref 98–111)
Creatinine, Ser: 1.76 mg/dL — ABNORMAL HIGH (ref 0.44–1.00)
GFR, Estimated: 33 mL/min — ABNORMAL LOW (ref >60)
Glucose, Bld: 102 mg/dL — ABNORMAL HIGH (ref 70–99)
Phosphorus: 2.7 mg/dL (ref 2.5–4.6)
Potassium: 3.8 mmol/L (ref 3.5–5.1)
Sodium: 137 mmol/L (ref 135–145)

## 2020-04-01 LAB — GLUCOSE, CAPILLARY
Glucose-Capillary: 95 mg/dL (ref 70–99)
Glucose-Capillary: 202 mg/dL — ABNORMAL HIGH (ref 70–99)

## 2020-04-01 LAB — MAGNESIUM: Magnesium: 1.6 mg/dL — ABNORMAL LOW (ref 1.7–2.4)

## 2020-04-01 MED ORDER — MAGNESIUM SULFATE 2 GM/50ML IV SOLN
2.0000 g | Freq: Once | INTRAVENOUS | Status: AC
  Administered 2020-04-01: 2 g via INTRAVENOUS
  Filled 2020-04-01: qty 50

## 2020-04-01 MED ORDER — AMOXICILLIN-POT CLAVULANATE 500-125 MG PO TABS: 500.0000 mg | ORAL_TABLET | Freq: Two times a day (BID) | ORAL | 0 refills | Status: DC

## 2020-04-01 NOTE — Progress Notes (Signed)
Discharged with Jessica Mercado

## 2020-04-01 NOTE — Discharge Summary (Signed)
Physician Discharge Summary  CANYON WILLOW JIR:[id number] DOB: 1960/09/12 DOA: 03/29/2020  PCP: Vassie Moment, MD  Admit date: 03/29/2020 Discharge date: 04/01/2020  Admitted From: Home Disposition: Home  Recommendations for Outpatient Follow-up:  1. Follow ups as below. 2. Please obtain CBC/BMP/Mag at follow up 3. Consider nephrology referral outpatient. 4. Please follow up on the following pending results: None  Home Health: None required Equipment/Devices: None required  Discharge Condition: Stable CODE STATUS: Full code   Follow-up Information    Lott, Elwin Sleight, MD. Schedule an appointment as soon as possible for a visit in 1 week(s).   Specialty: Family Medicine Contact information: [address] [place] [address] [phone]                Hospital Course: 59 year old F with PMH of DM-2, HTN, HLD, anxiety, depression and obesity brought to ED by EMS due to progressive fever, congestion and cough for 3 days.  Reportedly diagnosed with pneumonia outpatient 2 days prior to admission and prescribed antibiotics and steroid that she did not start taking.  In ED, hemodynamically stable. RR as high as 22.  Reportedly desaturated to high 80s and started on 2 L by [employer].  WBC 9.9. Na 128. Cr 2.9.  Glucose 503.  CT chest with bilateral lower lobe pneumonia.  RVP negative. Started on IV ceftriaxone and IV azithromycin and admitted for community-acquired pneumonia.  Patient received ceftriaxone and azithromycin for 4 days with significant improvement in her symptoms.  Eventually liberated of oxygen.  Discharged on p.o. Augmentin for 2 more days to complete treatment course.  AKI improved.  She was evaluated by therapy and no need was identified.  See individual problem list below for more on hospital course.   Discharge Diagnoses:  Acute hypoxemic respiratory failure due to community-acquired pneumonia: Resolved.  Received ceftriaxone and azithromycin for 4 days.   Discharged on p.o. Augmentin for 2 more days.  Maintain appropriate saturation with ambulation on room air prior to discharge.  Community-acquired pneumonia:  CXR and CT chest with bibasilar lower lobe pneumonia.  COVID-19, influenza and RSV PCR negative.  Blood culture NGTD. -As above.  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  Not in DKA or HHS. Recent Labs  Lab 03/31/20 1547 03/31/20 1608 03/31/20 2109 04/01/20 0743 04/01/20 1116  GLUCAP 46* 125* 255* 95 202*  -Discharged on home medications as below.  AKI/azotemia on CKD-unknown stage? Unknown baseline.  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  Cr 1.43 in 2019> 2.29 (admit)> 2.2> 1.76.   Renal ultrasound without significant finding. -Recheck renal function at follow-up. -Could benefit from nephrology referral outpatient.  Hyponatremia: Na 128 but corrects to normal for hyperglycemia.  Resolved.  Anxiety/depression: Stable -Continue home medications-Latuda, Prozac, trazodone,  Debility: Reportedly uses Rollator at baseline. -No need identified by therapy.  Class I obesity  Body mass index is 34.93 kg/m.            Discharge Exam: Vitals:   03/31/20 2115 04/01/20 0627  BP: (!) 168/79 (!) 147/77  Pulse: 74 73  Resp: 14 14  Temp: 98.3 F (36.8 C) 98.3 F (36.8 C)  SpO2: 93% 90%    GENERAL: No apparent distress.  Nontoxic. HEENT: MMM.  Vision and hearing grossly intact.  NECK: Supple.  No apparent JVD.  RESP: On RA.  No IWOB.  Fair aeration bilaterally. CVS:  RRR. Heart sounds normal.  ABD/GI/GU: Bowel sounds present. Soft. Non tender.  MSK/EXT:  Moves extremities. No apparent deformity. No edema.  SKIN: no apparent skin lesion or wound NEURO: Awake, alert and oriented appropriately.  No apparent focal neuro deficit. PSYCH: Calm. Normal affect.   Discharge Instructions  Discharge Instructions    Call MD for:  difficulty breathing, headache or visual disturbances   Complete by: As directed     Call MD for:  extreme fatigue   Complete by: As directed    Call MD for:  persistant dizziness or light-headedness   Complete by: As directed    Call MD for:  temperature >100.4   Complete by: As directed    Diet - low sodium heart healthy   Complete by: As directed    Diet Carb Modified   Complete by: As directed    Discharge instructions   Complete by: As directed    It has been a pleasure taking care of you!  You were hospitalized and treated for pneumonia.  We are discharging you more antibiotics to complete treatment course.  Please review your new medication list and the directions take your medications before you take them. Follow-up with your primary care doctor in 1 to 2 weeks or sooner if needed.   Take care,   Increase activity slowly   Complete by: As directed      Allergies as of 04/01/2020      Reactions   Vancomycin Other (See Comments)   Red man syndrome      Medication List    STOP taking these medications   cyclobenzaprine 10 MG tablet Commonly known as: FLEXERIL     TAKE these medications   amoxicillin-clavulanate 500-125 MG tablet Commonly known as: Augmentin Take 1 tablet (500 mg total) by mouth in the morning and at bedtime.   atorvastatin 40 MG tablet Commonly known as: LIPITOR Take 40 mg by mouth daily.   FLUoxetine 40 MG capsule Commonly known as: PROZAC Take 40 mg by mouth every morning.   gabapentin 800 MG tablet Commonly known as: NEURONTIN Take 800 mg by mouth 3 (three) times daily.   glipiZIDE 10 MG tablet Commonly known as: GLUCOTROL Take 10 mg by mouth 2 (two) times daily.   HumaLOG Mix 75/25 KwikPen (75-25) 100 UNIT/ML Kwikpen Generic drug: Insulin Lispro Prot & Lispro Inject 40 Units into the skin 2 (two) times daily.   hydrOXYzine 25 MG tablet Commonly known as: ATARAX/VISTARIL Take 75 mg by mouth daily.   Latuda 40 MG Tabs tablet Generic drug: lurasidone Take 40 mg by mouth at bedtime.   metoprolol succinate 50  MG 24 hr tablet Commonly known as: TOPROL-XL Take 50 mg by mouth daily.   nitroGLYCERIN 0.2 mg/hr patch Commonly known as: NITRODUR - Dosed in mg/24 hr Apply 1/4th patch to affected shoulder, change daily   Ozempic (0.25 or 0.5 MG/DOSE) 2 MG/1.5ML Sopn Generic drug: Semaglutide(0.25 or 0.5MG /DOS) Inject 0.25 mg into the skin once a week. Take on Saturday   pramipexole 0.125 MG tablet Commonly known as: MIRAPEX Take 0.125 mg by mouth every evening.   prazosin 1 MG capsule Commonly known as: MINIPRESS Take 1 mg by mouth every evening.   silver sulfADIAZINE 1 % cream Commonly known as: SILVADENE Apply 1 application topically daily.   traZODone 100 MG tablet Commonly known as: DESYREL Take 300 mg by mouth at bedtime.   Vitamin D (Ergocalciferol) 1.25 MG (50000 UNIT) Caps capsule Commonly known as: DRISDOL Take 50,000 Units by mouth every Friday.       Consultations:  None  Procedures/Studies:   CT Chest Wo  Contrast  Result Date: 03/29/2020 CLINICAL DATA:  Cough and fever.  Abnormal chest x-ray. EXAM: CT CHEST WITHOUT CONTRAST TECHNIQUE: Multidetector CT imaging of the chest was performed following the standard protocol without IV contrast. COMPARISON:  Radiograph earlier today. FINDINGS: Cardiovascular: Atherosclerosis of the thoracic aorta. No aortic aneurysm. There is multi chamber cardiomegaly. Coronary artery calcifications. Small pericardial effusion that measures up to 12 mm in depth adjacent to the right heart. Mediastinum/Nodes: No enlarged mediastinal lymph nodes. Limited assessment for hilar adenopathy in the absence of IV contrast. There is no evidence of bulky hilar adenopathy or hilar mass. 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 No visualized thyroid nodule. Small hiatal hernia with distal esophageal wall thickening. Lungs/Pleura: Patchy and ground-glass opacities within both lower lobes, with a more consolidative component on the left. There may be mild tree-in-bud  opacities in the upper lobes. Low lung volumes and mild motion artifact limits detailed assessment. There is mucous plugging in the left lower lobe. Trachea and central bronchi are patent. Small bilateral pleural effusions. Upper Abdomen: No acute or unexpected findings. Musculoskeletal: Thoracic spondylosis. There are no acute or suspicious osseous abnormalities. Surgical hardware in the lower cervical spine is partially included. IMPRESSION: 1. Patchy and ground-glass opacities within both lower lobes, with a more consolidative component on the left. Findings most consistent with pneumonia. There may be mild tree-in-bud opacities in the upper lobes, bronchiolitis. 2. Small bilateral pleural effusions. 3. Cardiomegaly with small pericardial effusion. Coronary artery calcifications. 4. Small hiatal hernia with distal esophageal wall thickening, can be seen with reflux or esophagitis. 5. There is no evidence of hilar or mediastinal adenopathy on this noncontrast exam. Radiographic findings likely a combination of vascular structures and mediastinal lipomatosis. Aortic Atherosclerosis (ICD10-I70.0). Electronically Signed   By: Keith Rake M.D.   On: 03/29/2020 21:51   US RENAL  Result Date: 03/31/2020 CLINICAL DATA:  Acute kidney injury, diabetes mellitus, hypertension EXAM: RENAL / URINARY TRACT ULTRASOUND COMPLETE COMPARISON:  Abdominal ultrasound 01/07/2018 FINDINGS: Right Kidney: Renal measurements: 10.0 x 5.2 x 5.6 cm = volume: 152 mL. Cortical thinning. Upper normal cortical echogenicity. No mass, hydronephrosis, or shadowing calcification. Left Kidney: Renal measurements: 12.2 x 5.8 x 6.8 cm = volume: 251 mL. Normal cortical thickness. Upper normal cortical echogenicity. No mass, hydronephrosis, or shadowing calcification. Bladder: Appears normal for degree of bladder distention. Other: N/A IMPRESSION: Cortical thinning RIGHT kidney. No evidence of renal mass or hydronephrosis. Electronically Signed    By: Lavonia Dana M.D.   On: 03/31/2020 19:15   DG Chest Portable 1 View  Result Date: 03/29/2020 CLINICAL DATA:  59 year old with fever and recent reported diagnosis of pneumonia EXAM: PORTABLE CHEST 1 VIEW COMPARISON:  None FINDINGS: Trachea midline. Cardiomediastinal contours accentuated by low depth of expansion. Heart size likely enlarged. Hilar structures with some fullness. Thickening of RIGHT paratracheal stripe. Airspace disease at the LEFT lung base. No sign of effusion. On limited assessment no acute skeletal process. Signs of cervical spinal fusion. IMPRESSION: 1. Airspace disease at the LEFT lung base. Atelectasis versus infection. 2. Hilar structures with increased fullness in thickening of RIGHT paratracheal stripe. Possibility of adenopathy in the chest is considered, chest CT may be helpful for further evaluation. 3. Question of variant bronchial anatomy as well, this could be further evaluated with chest CT. Electronically Signed   By: Zetta Bills M.D.   On: 03/29/2020 20:35        The results of significant diagnostics from this hospitalization (including imaging, microbiology, ancillary and laboratory)  are listed below for reference.     Microbiology: Recent Results (from the past 240 hour(s))  Respiratory Panel by RT PCR (Flu A&B, Covid) - Nasopharyngeal Swab     Status: None   Collection Time: 03/29/20  8:10 PM   Specimen: Nasopharyngeal Swab  Result Value Ref Range Status   SARS Coronavirus 2 by RT PCR NEGATIVE NEGATIVE Final    Comment: (NOTE) SARS-CoV-2 target nucleic acids are NOT DETECTED.  The SARS-CoV-2 RNA is generally detectable in upper respiratoy specimens during the acute phase of infection. The lowest concentration of SARS-CoV-2 viral copies this assay can detect is 131 copies/mL. A negative result does not preclude SARS-Cov-2 infection and should not be used as the sole basis for treatment or other patient management decisions. A negative result may  occur with  improper specimen collection/handling, submission of specimen other than nasopharyngeal swab, presence of viral mutation(s) within the areas targeted by this assay, and inadequate number of viral copies (<131 copies/mL). A negative result must be combined with clinical observations, patient history, and epidemiological information. The expected result is Negative.  Fact Sheet for Patients:  PinkCheek.be  Fact Sheet for Healthcare Providers:  GravelBags.it  This test is no t yet approved or cleared by the [place] FDA and  has been authorized for detection and/or diagnosis of SARS-CoV-2 by FDA under an Emergency Use Authorization (EUA). This EUA will remain  in effect (meaning this test can be used) for the duration of the COVID-19 declaration under Section 564(b)(1) of the Act, 21 U.S.C. section 360bbb-3(b)(1), unless the authorization is terminated or revoked sooner.     Influenza A by PCR NEGATIVE NEGATIVE Final   Influenza B by PCR NEGATIVE NEGATIVE Final    Comment: (NOTE) The Xpert Xpress SARS-CoV-2/FLU/RSV assay is intended as an aid in  the diagnosis of influenza from Nasopharyngeal swab specimens and  should not be used as a sole basis for treatment. Nasal washings and  aspirates are unacceptable for Xpert Xpress SARS-CoV-2/FLU/RSV  testing.  Fact Sheet for Patients: PinkCheek.be  Fact Sheet for Healthcare Providers: GravelBags.it  This test is not yet approved or cleared by the [place] FDA and  has been authorized for detection and/or diagnosis of SARS-CoV-2 by  FDA under an Emergency Use Authorization (EUA). This EUA will remain  in effect (meaning this test can be used) for the duration of the  Covid-19 declaration under Section 564(b)(1) of the Act, 21  U.S.C. section 360bbb-3(b)(1), unless the authorization is  terminated or  revoked. Performed at [facility], [place] [address], [place], [place] [address]   Culture, blood (routine x 2)     Status: None (Preliminary result)   Collection Time: 03/30/20  4:51 AM   Specimen: BLOOD LEFT HAND  Result Value Ref Range Status   Specimen Description   Final    BLOOD LEFT HAND Performed at [facility], [place] [address], [place], [place] [address]    Special Requests   Final    BOTTLES DRAWN AEROBIC ONLY Blood Culture adequate volume Performed at [place] [address] [place], [place] [address]    Culture   Final    NO GROWTH 1 DAY Performed at [facility], [place] [address], [place], [place] [address]    Report Status PENDING  Incomplete  Culture, blood (routine x 2)     Status: None (Preliminary result)   Collection Time: 03/30/20  4:58 AM   Specimen: BLOOD  Result Value Ref Range Status   Specimen Description   Final    BLOOD LEFT ANTECUBITAL Performed at [place] [address] [place], [place] [address]    Special Requests   Final    BOTTLES DRAWN AEROBIC ONLY Blood Culture adequate volume Performed at [facility] [address], [place], [place] [address]    Culture   Final    NO GROWTH 1 DAY Performed at [facility], [place] [address], [place], [place] [address]    Report Status PENDING  Incomplete     Labs: BNP (last 3 results) Recent Labs    03/29/20 2003  BNP 357.0*   Basic Metabolic Panel: Recent Labs  Lab 03/29/20 2003 03/30/20 0451 03/31/20 0603 04/01/20 0549  NA 128* 133* 137 137  K 4.5 3.9 3.6 3.8  CL 94* 94* 101 104  CO2 23 23 25 25   GLUCOSE 503* 303* 93 102*  BUN 32* 29* 27* 22*  CREATININE 2.29* 2.20* 2.19* 1.76*  CALCIUM 8.3* 8.2* 8.2* 8.0*  MG  --  1.7 1.7 1.6*  PHOS  --  2.8 3.4 2.7   Liver Function Tests: Recent Labs  Lab 03/30/20 0451 03/31/20 0603 04/01/20 0549  ALBUMIN 3.0* 2.6* 2.5*   No results for  input(s): LIPASE, AMYLASE in the last 168 hours. No results for input(s): AMMONIA in the last 168 hours. CBC: Recent Labs  Lab 03/29/20 2003 03/30/20 0139  WBC 9.9 8.2  NEUTROABS 8.0*  --   HGB 12.4 12.1  HCT 36.4 37.3  MCV 84.7 86.7  PLT 212 219   Cardiac Enzymes: No results for input(s): CKTOTAL, CKMB, CKMBINDEX, TROPONINI in the last 168 hours. BNP: Invalid input(s): POCBNP CBG: Recent Labs  Lab 03/31/20 0741 03/31/20 1137 03/31/20 1547 03/31/20 1608 03/31/20 2109  GLUCAP 95 196* 46* 125* 255*   D-Dimer No results for input(s): DDIMER in the last 72 hours. Hgb A1c Recent Labs    03/30/20 0139  HGBA1C 9.7*   Lipid Profile No results for input(s): CHOL, HDL, LDLCALC, TRIG, CHOLHDL, LDLDIRECT in the last 72 hours. Thyroid function studies No results for input(s): TSH, T4TOTAL, T3FREE, THYROIDAB in the last 72 hours.  Invalid input(s): FREET3 Anemia work up No results for input(s): VITAMINB12, FOLATE, FERRITIN, TIBC, IRON, RETICCTPCT in the last 72 hours. Urinalysis    Component Value Date/Time   COLORURINE YELLOW 03/31/2020 1726   APPEARANCEUR CLEAR 03/31/2020 1726   LABSPEC 1.013 03/31/2020 1726   PHURINE 5.0 03/31/2020 1726   GLUCOSEU NEGATIVE 03/31/2020 1726   HGBUR SMALL (A) 03/31/2020 1726   BILIRUBINUR NEGATIVE 03/31/2020 1726   KETONESUR NEGATIVE 03/31/2020 1726   PROTEINUR 100 (A) 03/31/2020 1726   NITRITE NEGATIVE 03/31/2020 1726   LEUKOCYTESUR SMALL (A) 03/31/2020 1726   Sepsis Labs Invalid input(s): PROCALCITONIN,  WBC,  LACTICIDVEN   Time coordinating discharge: 35 minutes  SIGNED:  Mercy Riding, MD  [facility] 04/01/2020, 7:37 AM  If 7PM-7AM, please contact night-coverage [email or web address]

## 2020-04-01 NOTE — Discharge Instructions (Signed)
Community-Acquired Pneumonia, Adult Pneumonia is a type of lung infection that causes swelling in the airways of the lungs. Mucus and fluid may also build up inside the airways. This may cause coughing and difficulty breathing. There are different types of pneumonia. One type can develop while a person is in a hospital. A different type is called community-acquired pneumonia. It develops in people who are not, and have not recently been, in the hospital or another type of health care facility. What are the causes? This condition may be caused by:  Viruses. This is the most common cause of pneumonia.  Bacteria. Community-acquired pneumonia is often caused by Streptococcus pneumoniae bacteria. These bacteria are often passed from one person to another by breathing in droplets from the cough or sneeze of an infected person.  Fungi. This is the least common cause of pneumonia. What increases the risk? The following factors may make you more likely to develop this condition:  Having a chronic disease, such as chronic obstructive pulmonary disease (COPD), asthma, congestive heart failure, cystic fibrosis, diabetes, or kidney disease.  Having early-stage or late-stage HIV.  Having sickle cell disease.  Having had your spleen removed (splenectomy).  Having poor dental hygiene.  Having a medical condition that increases the risk of breathing in (aspirating) secretions from your own mouth and nose.  Having a weakened body defense system (immune system).  Being a smoker.  Traveling to areas where pneumonia-causing germs commonly exist.  Being around animal habitats or animals that have pneumonia-causing germs, including birds, bats, rabbits, cats, and farm animals. What are the signs or symptoms? Symptoms of this condition include:  A dry cough.  A wet (productive) cough.  Fever.  Sweating.  Chest pain, especially when breathing deeply or coughing.  Rapid breathing or difficulty  breathing.  Shortness of breath.  Shaking chills.  Fatigue.  Muscle aches. How is this diagnosed? This condition may be diagnosed based on:  Your medical history.  A physical exam. You may also have tests, including:  Chest X-rays.  Tests of your blood oxygen level and other blood gases.  Tests on blood, mucus (sputum), fluid around your lungs (pleural fluid), and urine. If your pneumonia is severe, other tests may be done to find the exact cause of your illness. How is this treated? Treatment for this condition depends on many factors, such as the cause of your pneumonia, the medicines you take, and other medical conditions that you have. For most adults, treatment and recovery from pneumonia may occur at home. In some cases, treatment must happen in a hospital. Treatment may include:  Medicines that are given by mouth or through an IV, including: ? Antibiotic medicines, if the pneumonia was caused by bacteria. ? Antiviral medicines, if the pneumonia was caused by a virus.  Being given extra oxygen.  Respiratory therapy. Although rare, treating severe pneumonia may include:  Using a machine to help you breathe (mechanical ventilation). This is done if you are not breathing well on your own and you cannot maintain a safe blood oxygen level.  Thoracentesis. This is a procedure to remove fluid from around one lung or both lungs to help you breathe better. Follow these instructions at home:  Medicines  Take over-the-counter and prescription medicines only as told by your health care provider. ? Only take cough medicine if you are losing sleep. Be aware that cough medicine can prevent your body's natural ability to remove mucus from your lungs.  If you were prescribed an antibiotic   medicine, take it as told by your health care provider. Do not stop taking the antibiotic even if you start to feel better. General instructions  Sleep in a semi-upright position at night. Try  sleeping in a reclining chair, or place a few pillows under your head.  Rest as needed and get at least 8 hours of sleep each night.  Drink enough water to keep your urine pale yellow. This will help to thin out mucus secretions in your lungs.  Eat a healthy diet that includes plenty of vegetables, fruits, whole grains, low-fat dairy products, and lean protein.  Do not use any products that contain nicotine or tobacco, such as cigarettes, e-cigarettes, and chewing tobacco. If you need help quitting, ask your health care provider.  Keep all follow-up visits as told by your health care provider. This is important. How is this prevented? You can lower your risk of developing community-acquired pneumonia by:  Getting a pneumococcal vaccine. There are different types and schedules of pneumococcal vaccines. Ask your health care provider which option is best for you. Consider getting the vaccine if: ? You are older than 58 years of age. ? You are older than 58 years of age and are undergoing cancer treatment, have chronic lung disease, or have other medical conditions that affect your immune system. Ask your health care provider if this applies to you.  Getting an influenza vaccine every year. Ask your health care provider which type of vaccine is best for you.  Getting regular checkups from your dentist.  Washing your hands often. If soap and water are not available, use hand sanitizer. Contact a health care provider if:  You have a fever.  You are losing sleep because you cannot control your cough with cough medicine. Get help right away if:  You have worsening shortness of breath.  You have increased chest pain.  Your sickness becomes worse, especially if you are an older adult or have a weakened immune system.  You cough up blood. Summary  Pneumonia is an infection of the lungs.  Community-acquired pneumonia develops in people who have not been in the hospital. It can be caused  by bacteria, viruses, or fungi.  This condition may be treated with antibiotics or antiviral medicines.  Severe cases may require hospitalization, mechanical ventilation, and other procedures to drain fluid from the lungs. This information is not intended to replace advice given to you by your health care provider. Make sure you discuss any questions you have with your health care provider. Document Revised: 01/07/2018 Document Reviewed: 01/07/2018 Elsevier Patient Education  2020 [employer]  

## 2020-04-04 LAB — CULTURE, BLOOD (ROUTINE X 2)
Culture: NO GROWTH
Culture: NO GROWTH
Special Requests: ADEQUATE
Special Requests: ADEQUATE

## 2020-06-04 ENCOUNTER — Encounter (INDEPENDENT_AMBULATORY_CARE_PROVIDER_SITE_OTHER): Payer: Self-pay | Admitting: Ophthalmology

## 2020-06-04 ENCOUNTER — Ambulatory Visit (INDEPENDENT_AMBULATORY_CARE_PROVIDER_SITE_OTHER): Payer: Medicaid Other | Admitting: Ophthalmology

## 2020-06-04 ENCOUNTER — Other Ambulatory Visit: Payer: Self-pay

## 2020-06-04 DIAGNOSIS — H25813 Combined forms of age-related cataract, bilateral: Secondary | ICD-10-CM

## 2020-06-04 DIAGNOSIS — H3581 Retinal edema: Secondary | ICD-10-CM | POA: Diagnosis not present

## 2020-06-04 DIAGNOSIS — E113311 Type 2 diabetes mellitus with moderate nonproliferative diabetic retinopathy with macular edema, right eye: Secondary | ICD-10-CM

## 2020-06-04 DIAGNOSIS — H35033 Hypertensive retinopathy, bilateral: Secondary | ICD-10-CM

## 2020-06-04 DIAGNOSIS — E113392 Type 2 diabetes mellitus with moderate nonproliferative diabetic retinopathy without macular edema, left eye: Secondary | ICD-10-CM

## 2020-06-04 DIAGNOSIS — I10 Essential (primary) hypertension: Secondary | ICD-10-CM

## 2020-06-04 DIAGNOSIS — H31012 Macula scars of posterior pole (postinflammatory) (post-traumatic), left eye: Secondary | ICD-10-CM

## 2020-06-04 MED ORDER — BEVACIZUMAB CHEMO INJECTION 1.25MG/0.05ML SYRINGE FOR KALEIDOSCOPE
1.2500 mg | INTRAVITREAL | Status: AC | PRN
  Administered 2020-06-04: 1.25 mg via INTRAVITREAL

## 2020-06-04 NOTE — Progress Notes (Signed)
[facility] Note  06/04/2020     CHIEF COMPLAINT Patient presents for Retina Evaluation   HISTORY OF PRESENT ILLNESS: Jessica Mercado is a 60 y.o. female who presents to the clinic today for:   HPI    Patient has history of diabetic macular edema OU. History of multiple avastin injections OU. Last injection OD on 12.06.21 and OS on 12.03.21. BS was 160 this am. Last a1c was 8.0, checked 2 months ago. Had PRP in both eyes with Dr. Posey Pronto also.   Last edited by Jobe Marker, COT on 06/04/2020 12:49 PM. (History)    pt is here to transfer care from Dr. Armanda Heritage due to insurance reasons, pt has had IVA OU, PRP OU and focal laser OU, pt states she has scar tissue in her left eye causing her vision to be blurry, she feels like the injections have helped with her vision  Referring physician: Jalene Mullet, MD [address] [place],  [place] [address]  HISTORICAL INFORMATION:   Selected notes from the MEDICAL RECORD NUMBER Referred by Dr. Armanda Heritage for transfer of care / DME OU LEE:  Ocular Hx- PMH-    CURRENT MEDICATIONS: No current outpatient medications on file. (Ophthalmic Drugs)   No current facility-administered medications for this visit. (Ophthalmic Drugs)   Current Outpatient Medications (Other)  Medication Sig  . atorvastatin (LIPITOR) 40 MG tablet Take 40 mg by mouth daily.  Marland Kitchen FLUoxetine (PROZAC) 40 MG capsule Take 40 mg by mouth every morning.  . gabapentin (NEURONTIN) 800 MG tablet Take 800 mg by mouth 3 (three) times daily.  Marland Kitchen glipiZIDE (GLUCOTROL) 10 MG tablet Take 10 mg by mouth 2 (two) times daily.  Marland Kitchen HUMALOG MIX 75/25 KWIKPEN (75-25) 100 UNIT/ML Kwikpen Inject 40 Units into the skin 2 (two) times daily.   . hydrOXYzine (ATARAX/VISTARIL) 25 MG tablet Take 75 mg by mouth daily.   Marland Kitchen LATUDA 40 MG TABS tablet Take 40 mg by mouth at bedtime.  . metoprolol succinate (TOPROL-XL) 50 MG 24 hr tablet Take 50 mg by mouth daily.  Marland Kitchen  OZEMPIC 0.25 or 0.5 MG/DOSE SOPN Inject 0.25 mg into the skin once a week. Take on Saturday  . traZODone (DESYREL) 100 MG tablet Take 300 mg by mouth at bedtime.   . Vitamin D, Ergocalciferol, (DRISDOL) 50000 units CAPS capsule Take 50,000 Units by mouth every Friday.  Marland Kitchen amoxicillin-clavulanate (AUGMENTIN) 500-125 MG tablet Take 1 tablet (500 mg total) by mouth in the morning and at bedtime. (Patient not taking: Reported on 06/04/2020)  . nitroGLYCERIN (NITRODUR - DOSED IN MG/24 HR) 0.2 mg/hr patch Apply 1/4th patch to affected shoulder, change daily (Patient not taking: No sig reported)  . pramipexole (MIRAPEX) 0.125 MG tablet Take 0.125 mg by mouth every evening. (Patient not taking: Reported on 06/04/2020)  . prazosin (MINIPRESS) 1 MG capsule Take 1 mg by mouth every evening. (Patient not taking: Reported on 06/04/2020)  . silver sulfADIAZINE (SILVADENE) 1 % cream Apply 1 application topically daily.  (Patient not taking: Reported on 06/04/2020)   No current facility-administered medications for this visit. (Other)      REVIEW OF SYSTEMS: ROS    Positive for: Musculoskeletal, Endocrine, Cardiovascular, Eyes   Negative for: Constitutional, Gastrointestinal, Neurological, Skin, Genitourinary, HENT, Respiratory, Psychiatric, Allergic/Imm, Heme/Lymph   Last edited by Roselee Nova D, COT on 06/04/2020 12:49 PM. (History)       ALLERGIES Allergies  Allergen Reactions  . Vancomycin Other (  See Comments)    Red man syndrome    PAST MEDICAL HISTORY Past Medical History:  Diagnosis Date  . Diabetes mellitus without complication ([place])   . Hyperlipemia   . Hypertension    Past Surgical History:  Procedure Laterality Date  . ANKLE ARTHROSCOPY WITH OPEN REDUCTION INTERNAL FIXATION (ORIF)      FAMILY HISTORY Family History  Problem Relation Age of Onset  . Diabetes Mother   . Diabetes Father   . Diabetes Sister     SOCIAL HISTORY Social History   Tobacco Use  . Smoking status:  Never Smoker  . Smokeless tobacco: Never Used  Vaping Use  . Vaping Use: Never used  Substance Use Topics  . Alcohol use: Not Currently  . Drug use: Not Currently         OPHTHALMIC EXAM:  Base Eye Exam    Visual Acuity (Snellen - Linear)      Right Left   Dist cc 20/40 -1 20/200 -3   Dist ph cc NI 20/150 -3   Correction: Glasses       Tonometry (Tonopen, 1:02 PM)      Right Left   Pressure 17 17       Pupils      Dark Light Shape React APD   Right 4 3 Round Slow None   Left 4 3 Round Slow None       Visual Fields (Counting fingers)      Left Right    Full Full       Extraocular Movement      Right Left    Full, Ortho Full, Ortho       Neuro/Psych    Oriented x3: Yes   Mood/Affect: Normal       Dilation    Both eyes: 1.0% Mydriacyl, 2.5% Phenylephrine @ 1:02 PM        Slit Lamp and Fundus Exam    Slit Lamp Exam      Right Left   Lids/Lashes Dermatochalasis - upper lid Dermatochalasis - upper lid   Conjunctiva/Sclera White and quiet White and quiet   Cornea trace Punctate epithelial erosions trace Punctate epithelial erosions   Anterior Chamber deep, clear, narrow temporal angle deep, clear, narrow temporal angle   Iris Round and dilated, No NVI Round and dilated, No NVI   Lens 2+ Nuclear sclerosis, 2+ Cortical cataract 2+ Nuclear sclerosis, 2+ Cortical cataract   Vitreous Vitreous syneresis Vitreous syneresis       Fundus Exam      Right Left   Disc Pink and Sharp Pink and Sharp   C/D Ratio 0.1 0.4   Macula Flat, Good foveal reflex, scattered MA, focal edema nasal and temporal mac Blunted foveal reflex, central pigmented CR scar, scattered MA and focal exudate, no frank edema   Vessels attenuated, Tortuous, AV crossing changes, Copper wiring attenuated, Tortuous, AV crossing changes, Copper wiring   Periphery Attached, scattered DBH, light 360 PRP room for fill in  Attached, scattered DBH and CWS, focal cluster of segmental PRP inferiorly         Refraction    Wearing Rx      Sphere Cylinder Axis Add   Right +1.00 Sphere  +2.25   Left [place] +0.50 082 +2.25       Manifest Refraction      Sphere Cylinder Axis Dist VA   Right +0.50 +0.50 098 20/40-3   Left +0.75 +0.50 082 20/200-3  IMAGING AND PROCEDURES  Imaging and Procedures for 06/04/2020  OCT, Retina - OU - Both Eyes       Right Eye Quality was good. Central Foveal Thickness: 207. Progression has no prior data. Findings include normal foveal contour, intraretinal fluid, no SRF (Focal IRF/DME nasal and temporal to fovea -- non-central).   Left Eye Quality was good. Central Foveal Thickness: 176. Progression has no prior data. Findings include normal foveal contour, no IRF, no SRF, outer retinal atrophy, pigment epithelial detachment, subretinal hyper-reflective material (Focal central macular scar, no frank IRF/DME).   Notes *Images captured and stored on drive  Diagnosis / Impression:  OD: +noncentral DME -- nasal and temporal to fovea OS: diffuse ORA w/ central macular scar -- no IRF/SRF/DME  Clinical management:  See below  Abbreviations: NFP - Normal foveal profile. CME - cystoid macular edema. PED - pigment epithelial detachment. IRF - intraretinal fluid. SRF - subretinal fluid. EZ - ellipsoid zone. ERM - epiretinal membrane. ORA - outer retinal atrophy. ORT - outer retinal tubulation. SRHM - subretinal hyper-reflective material. IRHM - intraretinal hyper-reflective material        Fluorescein Angiography Optos (Transit OS)       Right Eye   Progression has no prior data. Early phase findings include staining, microaneurysm (Poor images). Mid/Late phase findings include staining, leakage, microaneurysm (Poor images (lid artifact obscuring 40-50% of each photo), no NV).   Left Eye   Progression has no prior data. Early phase findings include vascular perfusion defect, staining, microaneurysm, blockage. Mid/Late phase findings include  staining, leakage, microaneurysm, blockage (Central blockage, No NV).   Notes **Images stored on drive**  Impression: Moderate NPDR OU Late leaking MA OU No NV OU         Intravitreal Injection, Pharmacologic Agent - OD - Right Eye       Time Out 06/04/2020. 2:27 PM. Confirmed correct patient, procedure, site, and patient consented.   Anesthesia Topical anesthesia was used. Anesthetic medications included Lidocaine 2%, Proparacaine 0.5%.   Procedure Preparation included 5% betadine to ocular surface, eyelid speculum. A supplied needle was used.   Injection:  1.25 mg Bevacizumab (AVASTIN) 1.80m/0.05mL SOLN   NDC: 540981-191-47 Lot: [id number] , Expiration date: 07/04/2020   Route: Intravitreal, Site: Right Eye, Waste: 0 mL  Post-op Post injection exam found visual acuity of at least counting fingers. The patient tolerated the procedure well. There were no complications. The patient received written and verbal post procedure care education. Post injection medications were not given.                 ASSESSMENT/PLAN:    ICD-10-CM   1. 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 Retinal edema  H35.81 OCT, Retina - OU - Both Eyes  3. 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 Essential hypertension  I10   6. Hypertensive retinopathy of both eyes  H35.033 Fluorescein Angiography Optos (Transit OS)  7. Combined forms of age-related cataract of both eyes  H25.813    1-4. Moderate non-proliferative diabetic retinopathy OU  OD: with DME  OS: with macular scar  - Former pt of Dr. NArmanda Heritage - H/x of multiple IVA OU (#  11 OD 12.06.21 / OS #13  12.03.21)  - s/p PRP OU (January and June 2021)  - s/p focal laser OU (January and June 2021) - The incidence, risk factors for progression, natural history and treatment options for diabetic retinopathy were discussed with patient.   - The need for close monitoring of blood glucose, blood pressure, and serum lipids, avoiding cigarette or any type of tobacco, and the need for long term follow up was also discussed with patient. - exam shows scattered MA/DBH OU, +DME OD, central macular scar OS, light PRP OU - BCVA 20/40 OD, 20/150 OS - FA (01.10.22) shows late leaking MA, no NV OU - OCT shows diabetic macular edema, OD, no DME OS - The natural history, pathology, and characteristics of diabetic macular edema discussed with patient.  A generalized discussion of the major clinical trials concerning treatment of diabetic macular edema (ETDRS, DCT, SCORE, RISE / RIDE, and ongoing DRCR net studies) was completed.  This discussion included mention of the various approaches to treating diabetic macular edema (observation, laser photocoagulation, anti-VEGF injections with lucentis / Avastin / Eylea, steroid injections with Kenalog / Ozurdex, and intraocular surgery with vitrectomy).  The goal hemoglobin A1C of 6-7 was discussed, as well as importance of smoking cessation and hypertension control.  Need for ongoing treatment and monitoring were specifically discussed with reference to chronic nature of diabetic macular edema. - recommend IVA #1 OD today, 01.10.22 - pt wishes to proceed - RBA of procedure discussed, questions answered - Avastin informed consent obtained and signed, 01.10.21 (OD) - see procedure note - f/u in 4 wks -- DFE/OCT, possible injection(s)  5,6. Hypertensive retinopathy OU - discussed importance of tight BP control - monitor  7. Mixed Cataract OU - The symptoms of cataract, surgical options, and treatments and risks were discussed with patient. - discussed diagnosis and  progression - monitor  Ophthalmic Meds Ordered this visit:  Meds ordered this encounter  Medications  . Bevacizumab (AVASTIN) SOLN 1.25 mg       Return for f/u 4-5 weeks, NPDR OU, DFE, OCT.  There are no Patient Instructions on file for this visit.   Explained the diagnoses, plan, and follow up with the patient and they expressed understanding.  Patient expressed understanding of the importance of proper follow up care.   This document serves as a record of services personally performed by Gardiner Sleeper, MD, PhD. It was created on their behalf by San Jetty. Casciano Shark, OA an ophthalmic technician. The creation of this record is the provider's dictation and/or activities during the visit.    Electronically signed by: San Jetty. Calcaterra Shark, [place] 01.10.2022 10:23 PM  Gardiner Sleeper, M.D., Ph.D. Diseases & Surgery of the Retina and Vitreous [facility]  I have reviewed the above documentation for accuracy and completeness, and I agree with the above. Gardiner Sleeper, M.D., Ph.D. 06/04/20 10:23 PM   Abbreviations: M myopia (nearsighted); A astigmatism; H hyperopia (farsighted); P presbyopia; Mrx spectacle prescription;  CTL contact lenses; OD right eye; OS left eye; OU both eyes  XT exotropia; ET esotropia; PEK punctate epithelial keratitis; PEE punctate epithelial erosions; DES dry eye syndrome; MGD meibomian gland dysfunction; ATs artificial tears; PFAT's preservative free artificial tears; [place] nuclear sclerotic cataract; PSC posterior subcapsular cataract; ERM epi-retinal membrane; PVD posterior vitreous detachment; RD retinal detachment; DM diabetes mellitus; DR diabetic retinopathy; NPDR non-proliferative diabetic retinopathy; PDR proliferative diabetic retinopathy; CSME clinically significant macular edema; DME diabetic macular edema; dbh  dot blot hemorrhages; CWS cotton wool spot; POAG primary open angle glaucoma; C/D cup-to-disc ratio; HVF humphrey visual field; GVF  goldmann visual field; OCT optical coherence tomography; IOP intraocular pressure; BRVO Branch retinal vein occlusion; CRVO central retinal vein occlusion; CRAO central retinal artery occlusion; BRAO branch retinal artery occlusion; RT retinal tear; SB scleral buckle; PPV pars plana vitrectomy; VH Vitreous hemorrhage; PRP panretinal laser photocoagulation; IVK intravitreal kenalog; VMT vitreomacular traction; MH Macular hole;  NVD neovascularization of the disc; NVE neovascularization elsewhere; AREDS age related eye disease study; ARMD age related macular degeneration; POAG primary open angle glaucoma; EBMD epithelial/anterior basement membrane dystrophy; ACIOL anterior chamber intraocular lens; IOL intraocular lens; PCIOL posterior chamber intraocular lens; Phaco/IOL phacoemulsification with intraocular lens placement; [place] photorefractive keratectomy; LASIK laser assisted in situ keratomileusis; HTN hypertension; DM diabetes mellitus; COPD chronic obstructive pulmonary disease

## 2020-07-03 NOTE — Progress Notes (Shared)
[facility] Note  07/09/2020     CHIEF COMPLAINT Patient presents for No chief complaint on file.   HISTORY OF PRESENT ILLNESS: Jessica Mercado is a 60 y.o. female who presents to the clinic today for:   pt is here to transfer care from Dr. Armanda Heritage due to insurance reasons, pt has had IVA OU, PRP OU and focal laser OU, pt states she has scar tissue in her left eye causing her vision to be blurry, she feels like the injections have helped with her vision  Referring physician: Vassie Moment, MD [place],  [place] [address]  HISTORICAL INFORMATION:   Selected notes from the MEDICAL RECORD NUMBER Referred by Dr. Armanda Heritage for transfer of care / DME OU LEE:  Ocular Hx- PMH-    CURRENT MEDICATIONS: No current outpatient medications on file. (Ophthalmic Drugs)   No current facility-administered medications for this visit. (Ophthalmic Drugs)   Current Outpatient Medications (Other)  Medication Sig  . amoxicillin-clavulanate (AUGMENTIN) 500-125 MG tablet Take 1 tablet (500 mg total) by mouth in the morning and at bedtime. (Patient not taking: Reported on 06/04/2020)  . atorvastatin (LIPITOR) 40 MG tablet Take 40 mg by mouth daily.  Marland Kitchen FLUoxetine (PROZAC) 40 MG capsule Take 40 mg by mouth every morning.  . gabapentin (NEURONTIN) 800 MG tablet Take 800 mg by mouth 3 (three) times daily.  Marland Kitchen glipiZIDE (GLUCOTROL) 10 MG tablet Take 10 mg by mouth 2 (two) times daily.  Marland Kitchen HUMALOG MIX 75/25 KWIKPEN (75-25) 100 UNIT/ML Kwikpen Inject 40 Units into the skin 2 (two) times daily.   . hydrOXYzine (ATARAX/VISTARIL) 25 MG tablet Take 75 mg by mouth daily.   Marland Kitchen LATUDA 40 MG TABS tablet Take 40 mg by mouth at bedtime.  . metoprolol succinate (TOPROL-XL) 50 MG 24 hr tablet Take 50 mg by mouth daily.  . nitroGLYCERIN (NITRODUR - DOSED IN MG/24 HR) 0.2 mg/hr patch Apply 1/4th patch to affected shoulder, change daily (Patient not taking: No sig reported)  .  OZEMPIC 0.25 or 0.5 MG/DOSE SOPN Inject 0.25 mg into the skin once a week. Take on Saturday  . pramipexole (MIRAPEX) 0.125 MG tablet Take 0.125 mg by mouth every evening. (Patient not taking: Reported on 06/04/2020)  . prazosin (MINIPRESS) 1 MG capsule Take 1 mg by mouth every evening. (Patient not taking: Reported on 06/04/2020)  . silver sulfADIAZINE (SILVADENE) 1 % cream Apply 1 application topically daily.  (Patient not taking: Reported on 06/04/2020)  . traZODone (DESYREL) 100 MG tablet Take 300 mg by mouth at bedtime.   . Vitamin D, Ergocalciferol, (DRISDOL) 50000 units CAPS capsule Take 50,000 Units by mouth every Friday.   No current facility-administered medications for this visit. (Other)      REVIEW OF SYSTEMS:    ALLERGIES Allergies  Allergen Reactions  . Vancomycin Other (See Comments)    Red man syndrome    PAST MEDICAL HISTORY Past Medical History:  Diagnosis Date  . Diabetes mellitus without complication ([place])   . Hyperlipemia   . Hypertension    Past Surgical History:  Procedure Laterality Date  . ANKLE ARTHROSCOPY WITH OPEN REDUCTION INTERNAL FIXATION (ORIF)      FAMILY HISTORY Family History  Problem Relation Age of Onset  . Diabetes Mother   . Diabetes Father   . Diabetes Sister     SOCIAL HISTORY Social History   Tobacco Use  . Smoking status: Never Smoker  . Smokeless  tobacco: Never Used  Vaping Use  . Vaping Use: Never used  Substance Use Topics  . Alcohol use: Not Currently  . Drug use: Not Currently         OPHTHALMIC EXAM:  Not recorded     IMAGING AND PROCEDURES  Imaging and Procedures for 07/09/2020           ASSESSMENT/PLAN:    ICD-10-CM   1. 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 Retinal edema  H35.81   3. 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 Essential hypertension  I10   6. Hypertensive retinopathy of both eyes  H35.033   7. Combined forms of age-related cataract of both eyes  H25.813    1-4. Moderate non-proliferative diabetic retinopathy OU  OD: with DME  OS: with macular scar  - Former pt of Dr. Armanda Heritage  - H/x of multiple IVA OU (#11 OD 12.06.21 / OS #13 12.03.21)             - s/p IVA OD #1 (01.10.22)  - s/p PRP OU (January and June 2021)  - s/p focal laser OU (January and June 2021) - The incidence, risk factors for progression, natural history and treatment options for diabetic retinopathy were discussed with patient.   - The need for close monitoring of blood glucose, blood pressure, and serum lipids, avoiding cigarette or any type of tobacco, and the need for long term follow up was also discussed with patient. - exam shows scattered MA/DBH OU, +DME OD, central macular scar OS, light PRP OU - BCVA 20/40 OD, 20/150 OS - FA (01.10.22) shows late leaking MA, no NV OU - OCT shows diabetic macular edema, OD, no DME OS - The natural history, pathology, and characteristics of diabetic macular edema discussed with patient.  A generalized discussion of the major clinical trials concerning treatment of diabetic macular edema (ETDRS, DCT, SCORE, RISE / RIDE, and ongoing DRCR net studies) was completed.  This discussion included mention of the various approaches to treating diabetic macular edema (observation, laser photocoagulation, anti-VEGF injections with lucentis / Avastin / Eylea, steroid injections with Kenalog / Ozurdex, and intraocular surgery with vitrectomy).  The goal hemoglobin A1C of 6-7 was discussed, as well as importance of smoking cessation and hypertension control.  Need for ongoing treatment and monitoring were specifically discussed with reference to chronic nature of diabetic macular edema. - recommend IVA #2 OD today, 02.14.22 - pt  wishes to proceed - RBA of procedure discussed, questions answered - Avastin informed consent obtained and signed, 01.10.21 (OD) - see procedure note - f/u in 4 wks -- DFE/OCT, possible injection(s)  5,6. Hypertensive retinopathy OU - discussed importance of tight BP control - monitor  7. Mixed Cataract OU - The symptoms of cataract, surgical options, and treatments and risks were discussed with patient. - discussed diagnosis and progression - monitor  Ophthalmic Meds Ordered this visit:  No orders of the defined types were placed in this encounter.      No follow-ups on file.  There are no Patient Instructions on file for this visit.  This document serves as a record of services personally performed by Gardiner Sleeper, MD, PhD. It was created on their behalf by Leeann Must, COA, an ophthalmic  technician. The creation of this record is the provider's dictation and/or activities during the visit.    Electronically signed by: Leeann Must, COA '@TODAY'$ @ 9:40 AM   Abbreviations: M myopia (nearsighted); A astigmatism; H hyperopia (farsighted); P presbyopia; Mrx spectacle prescription;  CTL contact lenses; OD right eye; OS left eye; OU both eyes  XT exotropia; ET esotropia; PEK punctate epithelial keratitis; PEE punctate epithelial erosions; DES dry eye syndrome; MGD meibomian gland dysfunction; ATs artificial tears; PFAT's preservative free artificial tears; [place] nuclear sclerotic cataract; PSC posterior subcapsular cataract; ERM epi-retinal membrane; PVD posterior vitreous detachment; RD retinal detachment; DM diabetes mellitus; DR diabetic retinopathy; NPDR non-proliferative diabetic retinopathy; PDR proliferative diabetic retinopathy; CSME clinically significant macular edema; DME diabetic macular edema; dbh dot blot hemorrhages; CWS cotton wool spot; POAG primary open angle glaucoma; C/D cup-to-disc ratio; HVF humphrey visual field; GVF goldmann visual field; OCT optical coherence  tomography; IOP intraocular pressure; BRVO Branch retinal vein occlusion; CRVO central retinal vein occlusion; CRAO central retinal artery occlusion; BRAO branch retinal artery occlusion; RT retinal tear; SB scleral buckle; PPV pars plana vitrectomy; VH Vitreous hemorrhage; PRP panretinal laser photocoagulation; IVK intravitreal kenalog; VMT vitreomacular traction; MH Macular hole;  NVD neovascularization of the disc; NVE neovascularization elsewhere; AREDS age related eye disease study; ARMD age related macular degeneration; POAG primary open angle glaucoma; EBMD epithelial/anterior basement membrane dystrophy; ACIOL anterior chamber intraocular lens; IOL intraocular lens; PCIOL posterior chamber intraocular lens; Phaco/IOL phacoemulsification with intraocular lens placement; [place] photorefractive keratectomy; LASIK laser assisted in situ keratomileusis; HTN hypertension; DM diabetes mellitus; COPD chronic obstructive pulmonary disease

## 2020-07-09 ENCOUNTER — Encounter (INDEPENDENT_AMBULATORY_CARE_PROVIDER_SITE_OTHER): Payer: Self-pay

## 2020-07-09 ENCOUNTER — Encounter (INDEPENDENT_AMBULATORY_CARE_PROVIDER_SITE_OTHER): Payer: Medicaid Other | Admitting: Ophthalmology

## 2020-07-09 DIAGNOSIS — H31012 Macula scars of posterior pole (postinflammatory) (post-traumatic), left eye: Secondary | ICD-10-CM

## 2020-07-09 DIAGNOSIS — E113392 Type 2 diabetes mellitus with moderate nonproliferative diabetic retinopathy without macular edema, left eye: Secondary | ICD-10-CM

## 2020-07-09 DIAGNOSIS — I10 Essential (primary) hypertension: Secondary | ICD-10-CM

## 2020-07-09 DIAGNOSIS — H35033 Hypertensive retinopathy, bilateral: Secondary | ICD-10-CM

## 2020-07-09 DIAGNOSIS — H25813 Combined forms of age-related cataract, bilateral: Secondary | ICD-10-CM

## 2020-07-09 DIAGNOSIS — E113311 Type 2 diabetes mellitus with moderate nonproliferative diabetic retinopathy with macular edema, right eye: Secondary | ICD-10-CM

## 2020-07-09 DIAGNOSIS — H3581 Retinal edema: Secondary | ICD-10-CM

## 2020-08-08 NOTE — Progress Notes (Signed)
[facility] Note  08/10/2020     CHIEF COMPLAINT Patient presents for Retina Follow Up   HISTORY OF PRESENT ILLNESS: Jessica Mercado is a 60 y.o. female who presents to the clinic today for:   HPI    Retina Follow Up    Patient presents with  Diabetic Retinopathy.  In both eyes.  This started months ago.  Severity is moderate.  Duration of 9.5 weeks.  Since onset it is gradually worsening.  I, the attending physician,  performed the HPI with the patient and updated documentation appropriately.          Comments    60 y/o female pt here for 9.5 wk f/u for mod NPDR OU.  Appt overdue.  VA OU may be slightly worse.  Denies pain, FOL, floaters.  Is very photophobic.  No gtts.       Last edited by Bernarda Caffey, MD on 08/10/2020 11:09 PM. (History)    pt states she was lost to follow up due to contracting pneumonia after her appt in January, she states she could not see very well today while trying to read the eye chart  Referring physician: Vassie Moment, MD [place],  [place] [address]  HISTORICAL INFORMATION:   Selected notes from the MEDICAL RECORD NUMBER Referred by Dr. Armanda Heritage for transfer of care / DME OU LEE:  Ocular Hx- PMH-    CURRENT MEDICATIONS: No current outpatient medications on file. (Ophthalmic Drugs)   No current facility-administered medications for this visit. (Ophthalmic Drugs)   Current Outpatient Medications (Other)  Medication Sig  . amoxicillin-clavulanate (AUGMENTIN) 500-125 MG tablet Take 1 tablet (500 mg total) by mouth in the morning and at bedtime.  Marland Kitchen atorvastatin (LIPITOR) 40 MG tablet Take 40 mg by mouth daily.  . BD PEN NEEDLE NANO 2ND GEN 32G X 4 MM MISC USE AS DIRECTED 2 TIMES DAILY  . FLUoxetine (PROZAC) 20 MG capsule Take 60 mg by mouth every morning.  Marland Kitchen FLUoxetine (PROZAC) 40 MG capsule Take 40 mg by mouth every morning.  . gabapentin (NEURONTIN) 800 MG tablet Take 800 mg by mouth 3 (three)  times daily.  Marland Kitchen glipiZIDE (GLUCOTROL) 10 MG tablet Take 10 mg by mouth 2 (two) times daily.  Marland Kitchen HUMALOG MIX 75/25 KWIKPEN (75-25) 100 UNIT/ML Kwikpen Inject 40 Units into the skin 2 (two) times daily.   . hydrOXYzine (ATARAX/VISTARIL) 25 MG tablet Take 75 mg by mouth daily.   Marland Kitchen LATUDA 40 MG TABS tablet Take 40 mg by mouth at bedtime.  . metoprolol succinate (TOPROL-XL) 50 MG 24 hr tablet Take 50 mg by mouth daily.  . nitroGLYCERIN (NITRODUR - DOSED IN MG/24 HR) 0.2 mg/hr patch Apply 1/4th patch to affected shoulder, change daily  . OZEMPIC 0.25 or 0.5 MG/DOSE SOPN Inject 0.25 mg into the skin once a week. Take on Saturday  . pantoprazole (PROTONIX) 20 MG tablet TAKE 1 TABLET BY MOUTH EVERY DAY IN THE MORNING  . pentoxifylline (TRENTAL) 400 MG CR tablet Take 400 mg by mouth 2 (two) times daily.  . pramipexole (MIRAPEX) 0.125 MG tablet Take 0.125 mg by mouth every evening.  . prazosin (MINIPRESS) 1 MG capsule Take 1 mg by mouth every evening.  . silver sulfADIAZINE (SILVADENE) 1 % cream Apply 1 application topically daily.  . traZODone (DESYREL) 100 MG tablet Take 300 mg by mouth at bedtime.   . Vitamin D, Ergocalciferol, (DRISDOL) 50000 units CAPS capsule Take  50,000 Units by mouth every Friday.   No current facility-administered medications for this visit. (Other)      REVIEW OF SYSTEMS: ROS    Positive for: Endocrine, Eyes   Negative for: Constitutional, Gastrointestinal, Neurological, Skin, Genitourinary, Musculoskeletal, HENT, Cardiovascular, Respiratory, Psychiatric, Allergic/Imm, Heme/Lymph   Last edited by Matthew Folks, COA on 08/10/2020  2:08 PM. (History)       ALLERGIES Allergies  Allergen Reactions  . Vancomycin Other (See Comments)    Red man syndrome    PAST MEDICAL HISTORY Past Medical History:  Diagnosis Date  . Cataract    Mixed OU  . Diabetes mellitus without complication ([place])   . Diabetic retinopathy ([place])    NPDR OU  . Hyperlipemia   . Hypertension    . Hypertensive retinopathy    OU   Past Surgical History:  Procedure Laterality Date  . ANKLE ARTHROSCOPY WITH OPEN REDUCTION INTERNAL FIXATION (ORIF)      FAMILY HISTORY Family History  Problem Relation Age of Onset  . Diabetes Mother   . Diabetes Father   . Diabetes Sister     SOCIAL HISTORY Social History   Tobacco Use  . Smoking status: Never Smoker  . Smokeless tobacco: Never Used  Vaping Use  . Vaping Use: Never used  Substance Use Topics  . Alcohol use: Not Currently  . Drug use: Not Currently         OPHTHALMIC EXAM:  Base Eye Exam    Visual Acuity (Snellen - Linear)      Right Left   Dist cc 20/50 -2 20/200 -2   Dist ph cc NI NI   Correction: Glasses       Tonometry (Tonopen, 2:12 PM)      Right Left   Pressure 14 15       Pupils      Dark Light Shape React APD   Right 4 3 Round Slow None   Left 4 3 Round Slow None       Visual Fields (Counting fingers)      Left Right    Full Full       Extraocular Movement      Right Left    Full, Ortho Full, Ortho       Neuro/Psych    Oriented x3: Yes   Mood/Affect: Normal       Dilation    Both eyes: 1.0% Mydriacyl, 2.5% Phenylephrine @ 2:12 PM        Slit Lamp and Fundus Exam    Slit Lamp Exam      Right Left   Lids/Lashes Dermatochalasis - upper lid Dermatochalasis - upper lid   Conjunctiva/Sclera White and quiet White and quiet   Cornea trace Punctate epithelial erosions trace Punctate epithelial erosions   Anterior Chamber deep, clear, narrow temporal angle deep, clear, narrow temporal angle   Iris Round and dilated, No NVI Round and dilated, No NVI   Lens 2+ Nuclear sclerosis, 2+ Cortical cataract 2+ Nuclear sclerosis, 2+ Cortical cataract   Vitreous Vitreous syneresis Vitreous syneresis       Fundus Exam      Right Left   Disc Pink and Sharp, Compact Pink and Sharp   C/D Ratio 0.1 0.4   Macula Flat, Good foveal reflex, scattered MA, focal edema nasal and temporal mac Blunted  foveal reflex, central pigmented CR scar, scattered MA and focal exudate, interval development of temporal edema   Vessels attenuated, Tortuous, AV crossing changes, Copper wiring  attenuated, Tortuous, AV crossing changes, Copper wiring   Periphery Attached, scattered DBH, light 360 PRP room for fill in, scattered CWS posteriorly Attached, scattered DBH and CWS, focal cluster of segmental PRP inferiorly, CWS greatest posteriorly          IMAGING AND PROCEDURES  Imaging and Procedures for 08/10/2020  OCT, Retina - OU - Both Eyes       Right Eye Quality was good. Central Foveal Thickness: 205. Progression has worsened. Findings include normal foveal contour, intraretinal fluid, no SRF (Interval increase in IRF).   Left Eye Quality was good. Central Foveal Thickness: 186. Progression has worsened. Findings include normal foveal contour, no IRF, no SRF, outer retinal atrophy, pigment epithelial detachment, subretinal hyper-reflective material (Interval re-development of IRF IT mac).   Notes *Images captured and stored on drive  Diagnosis / Impression:  DME OU OD: Interval increase in IRF OS: Interval re-development of IRF IT mac  Clinical management:  See below  Abbreviations: NFP - Normal foveal profile. CME - cystoid macular edema. PED - pigment epithelial detachment. IRF - intraretinal fluid. SRF - subretinal fluid. EZ - ellipsoid zone. ERM - epiretinal membrane. ORA - outer retinal atrophy. ORT - outer retinal tubulation. SRHM - subretinal hyper-reflective material. IRHM - intraretinal hyper-reflective material        Intravitreal Injection, Pharmacologic Agent - OD - Right Eye       Time Out 08/10/2020. 2:52 PM. Confirmed correct patient, procedure, site, and patient consented.   Anesthesia Topical anesthesia was used. Anesthetic medications included Lidocaine 2%, Proparacaine 0.5%.   Procedure Preparation included 5% betadine to ocular surface, eyelid speculum. A  supplied needle was used.   Injection:  1.25 mg Bevacizumab (AVASTIN) 1.2m/0.05mL SOLN   NDC: 510626-948-54 Lot:: [id number] Expiration date: 10/01/2020   Route: Intravitreal, Site: Right Eye, Waste: 0 mL  Post-op Post injection exam found visual acuity of at least counting fingers. The patient tolerated the procedure well. There were no complications. The patient received written and verbal post procedure care education. Post injection medications were not given.        Intravitreal Injection, Pharmacologic Agent - OS - Left Eye       Time Out 08/10/2020. 2:52 PM. Confirmed correct patient, procedure, site, and patient consented.   Anesthesia Topical anesthesia was used. Anesthetic medications included Lidocaine 2%, Proparacaine 0.5%.   Procedure Preparation included 5% betadine to ocular surface, eyelid speculum. A (32g) needle was used.   Injection:  1.25 mg Bevacizumab (AVASTIN) 1.268m0.05mL SOLN   NDC: 5009381-829-93Lot: [id number], Expiration date: 10/02/2020   Route: Intravitreal, Site: Left Eye, Waste: 0.05 mL  Post-op Post injection exam found visual acuity of at least counting fingers. The patient tolerated the procedure well. There were no complications. The patient received written and verbal post procedure care education.                 ASSESSMENT/PLAN:    ICD-10-CM   1. 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 Retinal edema  H35.81 OCT, Retina - OU - Both Eyes  3. Moderate nonproliferative diabetic retinopathy of left eye without macular edema associated with type 2 diabetes mellitus ([place] E1L38.1017 4. Macular  scar of left eye  H31.012   5. Essential hypertension   I10   6. Hypertensive retinopathy of both eyes  H35.033   7. Combined forms of age-related cataract of both eyes  H25.813    1-4. Moderate non-proliferative diabetic retinopathy OU  OD: with DME  OS: with macular scar  - Former pt of Dr. Armanda Heritage  - H/x of multiple IVA OU (#11 OD 12.06.21 / OS #13 12.03.21)  - s/p PRP OU (January and June 2021)  - s/p focal laser OU (January and June 2021)  - FA (01.10.22) shows late leaking MA, no NV OU  - s/p IVA OD #1 (01.10.22) for DME - exam shows scattered MA/DBH OU, +DME OD, central macular scar OS, light PRP OU - BCVA 20/50 OD, 20/200 OS (worse OU) - OCT shows diabetic macular edema worse OU - recommend IVA OU (OD #2 and OS #1) today, 03.18.22 - pt wishes to proceed - RBA of procedure discussed, questions answered - Avastin informed consent obtained and signed, 01.10.21 (OD) - see procedure note - f/u in 4 wks -- DFE/OCT, possible injection(s)  5,6. Hypertensive retinopathy OU - discussed importance of tight BP control - monitor  7. Mixed Cataract OU - The symptoms of cataract, surgical options, and treatments and risks were discussed with patient. - discussed diagnosis and progression - monitor  Ophthalmic Meds Ordered this visit:  Meds ordered this encounter  Medications  . Bevacizumab (AVASTIN) SOLN 1.25 mg  . Bevacizumab (AVASTIN) SOLN 1.25 mg       Return in about 4 weeks (around 09/07/2020) for f/u NPDR OU, DFE, OCT.  There are no Patient Instructions on file for this visit.   Explained the diagnoses, plan, and follow up with the patient and they expressed understanding.  Patient expressed understanding of the importance of proper follow up care.   This document serves as a record of services personally performed by Gardiner Sleeper, MD, PhD. It was created on their behalf by San Jetty. Eagleson Shark, OA an ophthalmic technician. The creation of this record is the provider's dictation and/or activities during the visit.     Electronically signed by: San Jetty. Bitner Shark, [place] 03.16.2022 11:14 PM  Gardiner Sleeper, M.D., Ph.D. Diseases & Surgery of the Retina and Vitreous [facility]  I have reviewed the above documentation for accuracy and completeness, and I agree with the above. Gardiner Sleeper, M.D., Ph.D. 08/10/20 11:14 PM   Abbreviations: M myopia (nearsighted); A astigmatism; H hyperopia (farsighted); P presbyopia; Mrx spectacle prescription;  CTL contact lenses; OD right eye; OS left eye; OU both eyes  XT exotropia; ET esotropia; PEK punctate epithelial keratitis; PEE punctate epithelial erosions; DES dry eye syndrome; MGD meibomian gland dysfunction; ATs artificial tears; PFAT's preservative free artificial tears; [place] nuclear sclerotic cataract; PSC posterior subcapsular cataract; ERM epi-retinal membrane; PVD posterior vitreous detachment; RD retinal detachment; DM diabetes mellitus; DR diabetic retinopathy; NPDR non-proliferative diabetic retinopathy; PDR proliferative diabetic retinopathy; CSME clinically significant macular edema; DME diabetic macular edema; dbh dot blot hemorrhages; CWS cotton wool spot; POAG primary open angle glaucoma; C/D cup-to-disc ratio; HVF humphrey visual field; GVF goldmann visual field; OCT optical coherence tomography; IOP intraocular pressure; BRVO Branch retinal vein occlusion; CRVO central retinal vein occlusion; CRAO central retinal artery occlusion; BRAO branch retinal artery occlusion; RT retinal tear; SB scleral buckle; PPV pars plana vitrectomy; VH Vitreous hemorrhage; PRP panretinal laser photocoagulation; IVK intravitreal kenalog; VMT vitreomacular traction; MH Macular hole;  NVD neovascularization of the disc; NVE neovascularization elsewhere; AREDS age related eye disease study; ARMD age related macular degeneration; POAG primary open angle glaucoma; EBMD epithelial/anterior basement membrane dystrophy; ACIOL anterior chamber intraocular lens; IOL  intraocular lens; PCIOL posterior chamber intraocular lens; Phaco/IOL phacoemulsification with intraocular lens placement; [place] photorefractive keratectomy; LASIK laser assisted in situ keratomileusis; HTN hypertension; DM diabetes mellitus; COPD chronic obstructive pulmonary disease

## 2020-08-10 ENCOUNTER — Ambulatory Visit (INDEPENDENT_AMBULATORY_CARE_PROVIDER_SITE_OTHER): Payer: Medicaid Other | Admitting: Ophthalmology

## 2020-08-10 ENCOUNTER — Other Ambulatory Visit: Payer: Self-pay

## 2020-08-10 ENCOUNTER — Encounter (INDEPENDENT_AMBULATORY_CARE_PROVIDER_SITE_OTHER): Payer: Self-pay | Admitting: Ophthalmology

## 2020-08-10 DIAGNOSIS — E113392 Type 2 diabetes mellitus with moderate nonproliferative diabetic retinopathy without macular edema, left eye: Secondary | ICD-10-CM | POA: Diagnosis not present

## 2020-08-10 DIAGNOSIS — H3581 Retinal edema: Secondary | ICD-10-CM | POA: Diagnosis not present

## 2020-08-10 DIAGNOSIS — E113311 Type 2 diabetes mellitus with moderate nonproliferative diabetic retinopathy with macular edema, right eye: Secondary | ICD-10-CM

## 2020-08-10 DIAGNOSIS — I10 Essential (primary) hypertension: Secondary | ICD-10-CM

## 2020-08-10 DIAGNOSIS — H31012 Macula scars of posterior pole (postinflammatory) (post-traumatic), left eye: Secondary | ICD-10-CM | POA: Diagnosis not present

## 2020-08-10 DIAGNOSIS — H25813 Combined forms of age-related cataract, bilateral: Secondary | ICD-10-CM

## 2020-08-10 DIAGNOSIS — H35033 Hypertensive retinopathy, bilateral: Secondary | ICD-10-CM

## 2020-08-10 MED ORDER — BEVACIZUMAB CHEMO INJECTION 1.25MG/0.05ML SYRINGE FOR KALEIDOSCOPE
1.2500 mg | INTRAVITREAL | Status: AC | PRN
  Administered 2020-08-10: 1.25 mg via INTRAVITREAL

## 2020-09-06 NOTE — Progress Notes (Shared)
[facility] Note  09/10/2020     CHIEF COMPLAINT Patient presents for No chief complaint on file.   HISTORY OF PRESENT ILLNESS: Jessica Mercado is a 60 y.o. female who presents to the clinic today for:   pt states she was lost to follow up due to contracting pneumonia after her appt in January, she states she could not see very well today while trying to read the eye chart  Referring physician: Vassie Moment, MD [place],  [place] [address]  HISTORICAL INFORMATION:   Selected notes from the MEDICAL RECORD NUMBER Referred by Dr. Armanda Heritage for transfer of care / DME OU LEE:  Ocular Hx- PMH-    CURRENT MEDICATIONS: No current outpatient medications on file. (Ophthalmic Drugs)   No current facility-administered medications for this visit. (Ophthalmic Drugs)   Current Outpatient Medications (Other)  Medication Sig  . amoxicillin-clavulanate (AUGMENTIN) 500-125 MG tablet Take 1 tablet (500 mg total) by mouth in the morning and at bedtime.  Marland Kitchen atorvastatin (LIPITOR) 40 MG tablet Take 40 mg by mouth daily.  . BD PEN NEEDLE NANO 2ND GEN 32G X 4 MM MISC USE AS DIRECTED 2 TIMES DAILY  . FLUoxetine (PROZAC) 20 MG capsule Take 60 mg by mouth every morning.  Marland Kitchen FLUoxetine (PROZAC) 40 MG capsule Take 40 mg by mouth every morning.  . gabapentin (NEURONTIN) 800 MG tablet Take 800 mg by mouth 3 (three) times daily.  Marland Kitchen glipiZIDE (GLUCOTROL) 10 MG tablet Take 10 mg by mouth 2 (two) times daily.  Marland Kitchen HUMALOG MIX 75/25 KWIKPEN (75-25) 100 UNIT/ML Kwikpen Inject 40 Units into the skin 2 (two) times daily.   . hydrOXYzine (ATARAX/VISTARIL) 25 MG tablet Take 75 mg by mouth daily.   Marland Kitchen LATUDA 40 MG TABS tablet Take 40 mg by mouth at bedtime.  . metoprolol succinate (TOPROL-XL) 50 MG 24 hr tablet Take 50 mg by mouth daily.  . nitroGLYCERIN (NITRODUR - DOSED IN MG/24 HR) 0.2 mg/hr patch Apply 1/4th patch to affected shoulder, change daily  . OZEMPIC 0.25 or 0.5  MG/DOSE SOPN Inject 0.25 mg into the skin once a week. Take on Saturday  . pantoprazole (PROTONIX) 20 MG tablet TAKE 1 TABLET BY MOUTH EVERY DAY IN THE MORNING  . pentoxifylline (TRENTAL) 400 MG CR tablet Take 400 mg by mouth 2 (two) times daily.  . pramipexole (MIRAPEX) 0.125 MG tablet Take 0.125 mg by mouth every evening.  . prazosin (MINIPRESS) 1 MG capsule Take 1 mg by mouth every evening.  . silver sulfADIAZINE (SILVADENE) 1 % cream Apply 1 application topically daily.  . traZODone (DESYREL) 100 MG tablet Take 300 mg by mouth at bedtime.   . Vitamin D, Ergocalciferol, (DRISDOL) 50000 units CAPS capsule Take 50,000 Units by mouth every Friday.   No current facility-administered medications for this visit. (Other)      REVIEW OF SYSTEMS:    ALLERGIES Allergies  Allergen Reactions  . Vancomycin Other (See Comments)    Red man syndrome    PAST MEDICAL HISTORY Past Medical History:  Diagnosis Date  . Cataract    Mixed OU  . Diabetes mellitus without complication ([place])   . Diabetic retinopathy ([place])    NPDR OU  . Hyperlipemia   . Hypertension   . Hypertensive retinopathy    OU   Past Surgical History:  Procedure Laterality Date  . ANKLE ARTHROSCOPY WITH OPEN REDUCTION INTERNAL FIXATION (ORIF)      FAMILY  HISTORY Family History  Problem Relation Age of Onset  . Diabetes Mother   . Diabetes Father   . Diabetes Sister     SOCIAL HISTORY Social History   Tobacco Use  . Smoking status: Never Smoker  . Smokeless tobacco: Never Used  Vaping Use  . Vaping Use: Never used  Substance Use Topics  . Alcohol use: Not Currently  . Drug use: Not Currently         OPHTHALMIC EXAM:  Not recorded     IMAGING AND PROCEDURES  Imaging and Procedures for 09/10/2020           ASSESSMENT/PLAN:    ICD-10-CM   1. 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 Retinal  edema  H35.81   3. Moderate nonproliferative diabetic retinopathy of left eye without macular edema associated with type 2 diabetes mellitus ([place])  [id number]   4. Macular scar of left eye  H31.012   5. Essential hypertension  I10   6. Hypertensive retinopathy of both eyes  H35.033   7. Combined forms of age-related cataract of both eyes  H25.813    1-4. Moderate non-proliferative diabetic retinopathy OU  OD: with DME  OS: with macular scar  - Former pt of Dr. Armanda Heritage  - H/x of multiple IVA OU (#11 OD 12.06.21 / OS #13 12.03.21)  - s/p PRP OU (January and June 2021)  - s/p focal laser OU (January and June 2021)  - FA (01.10.22) shows late leaking MA, no NV OU  - s/p IVA OD #1 (01.10.22) for DME, #2 (03.18.22)             - s/p IVA OS #1 (03.18.22) - exam shows scattered MA/DBH OU, +DME OD, central macular scar OS, light PRP OU - BCVA 20/50 OD, 20/200 OS (worse OU) - OCT shows diabetic macular edema worse OU - recommend IVA OU (OD #3 and OS #2) today, 04.18.22 - pt wishes to proceed - RBA of procedure discussed, questions answered - Avastin informed consent obtained and signed, 01.10.21 (OD) - see procedure note - f/u in 4 wks -- DFE/OCT, possible injection(s)  5,6. Hypertensive retinopathy OU - discussed importance of tight BP control - monitor  7. Mixed Cataract OU - The symptoms of cataract, surgical options, and treatments and risks were discussed with patient. - discussed diagnosis and progression - monitor  Ophthalmic Meds Ordered this visit:  No orders of the defined types were placed in this encounter.      No follow-ups on file.  There are no Patient Instructions on file for this visit.  This document serves as a record of services personally performed by Gardiner Sleeper, MD, PhD. It was created on their behalf by Leeann Must, [place], an ophthalmic technician. The creation of this record is the provider's dictation and/or activities during the visit.     Electronically signed by: Leeann Must, COA '@TODAY'$ @ 8:35 AM   Abbreviations: M myopia (nearsighted); A astigmatism; H hyperopia (farsighted); P presbyopia; Mrx spectacle prescription;  CTL contact lenses; OD right eye; OS left eye; OU both eyes  XT exotropia; ET esotropia; PEK punctate epithelial keratitis; PEE punctate epithelial erosions; DES dry eye syndrome; MGD meibomian gland dysfunction; ATs artificial tears; PFAT's preservative free artificial tears; [place] nuclear sclerotic cataract; PSC posterior subcapsular cataract; ERM epi-retinal membrane; PVD posterior vitreous detachment; RD retinal detachment; DM diabetes mellitus; DR diabetic retinopathy; NPDR non-proliferative  diabetic retinopathy; PDR proliferative diabetic retinopathy; CSME clinically significant macular edema; DME diabetic macular edema; dbh dot blot hemorrhages; CWS cotton wool spot; POAG primary open angle glaucoma; C/D cup-to-disc ratio; HVF humphrey visual field; GVF goldmann visual field; OCT optical coherence tomography; IOP intraocular pressure; BRVO Branch retinal vein occlusion; CRVO central retinal vein occlusion; CRAO central retinal artery occlusion; BRAO branch retinal artery occlusion; RT retinal tear; SB scleral buckle; PPV pars plana vitrectomy; VH Vitreous hemorrhage; PRP panretinal laser photocoagulation; IVK intravitreal kenalog; VMT vitreomacular traction; MH Macular hole;  NVD neovascularization of the disc; NVE neovascularization elsewhere; AREDS age related eye disease study; ARMD age related macular degeneration; POAG primary open angle glaucoma; EBMD epithelial/anterior basement membrane dystrophy; ACIOL anterior chamber intraocular lens; IOL intraocular lens; PCIOL posterior chamber intraocular lens; Phaco/IOL phacoemulsification with intraocular lens placement; [place] photorefractive keratectomy; LASIK laser assisted in situ keratomileusis; HTN hypertension; DM diabetes mellitus; COPD chronic obstructive pulmonary  disease

## 2020-09-07 ENCOUNTER — Encounter (INDEPENDENT_AMBULATORY_CARE_PROVIDER_SITE_OTHER): Payer: Medicaid Other | Admitting: Ophthalmology

## 2020-09-10 ENCOUNTER — Encounter (INDEPENDENT_AMBULATORY_CARE_PROVIDER_SITE_OTHER): Payer: Medicaid Other | Admitting: Ophthalmology

## 2020-09-10 DIAGNOSIS — H31012 Macula scars of posterior pole (postinflammatory) (post-traumatic), left eye: Secondary | ICD-10-CM

## 2020-09-10 DIAGNOSIS — H25813 Combined forms of age-related cataract, bilateral: Secondary | ICD-10-CM

## 2020-09-10 DIAGNOSIS — E113392 Type 2 diabetes mellitus with moderate nonproliferative diabetic retinopathy without macular edema, left eye: Secondary | ICD-10-CM

## 2020-09-10 DIAGNOSIS — I10 Essential (primary) hypertension: Secondary | ICD-10-CM

## 2020-09-10 DIAGNOSIS — E113311 Type 2 diabetes mellitus with moderate nonproliferative diabetic retinopathy with macular edema, right eye: Secondary | ICD-10-CM

## 2020-09-10 DIAGNOSIS — H35033 Hypertensive retinopathy, bilateral: Secondary | ICD-10-CM

## 2020-09-10 DIAGNOSIS — H3581 Retinal edema: Secondary | ICD-10-CM

## 2021-06-14 ENCOUNTER — Other Ambulatory Visit: Payer: Self-pay

## 2021-06-14 ENCOUNTER — Emergency Department (HOSPITAL_COMMUNITY)
Admission: EM | Admit: 2021-06-14 | Discharge: 2021-06-14 | Disposition: A | Discharge status: Home / Self Care | Payer: Medicaid Other | Attending: Emergency Medicine | Admitting: Emergency Medicine

## 2021-06-14 ENCOUNTER — Encounter (HOSPITAL_COMMUNITY): Payer: Self-pay

## 2021-06-14 DIAGNOSIS — Y802 Prosthetic and other implants, materials and accessory physical medicine devices associated with adverse incidents: Secondary | ICD-10-CM | POA: Insufficient documentation

## 2021-06-14 DIAGNOSIS — Z79899 Other long term (current) drug therapy: Secondary | ICD-10-CM | POA: Diagnosis not present

## 2021-06-14 DIAGNOSIS — Z7984 Long term (current) use of oral hypoglycemic drugs: Secondary | ICD-10-CM | POA: Diagnosis not present

## 2021-06-14 DIAGNOSIS — T839XXA Unspecified complication of genitourinary prosthetic device, implant and graft, initial encounter: Secondary | ICD-10-CM

## 2021-06-14 DIAGNOSIS — T83098A Other mechanical complication of other indwelling urethral catheter, initial encounter: Secondary | ICD-10-CM | POA: Diagnosis present

## 2021-06-14 DIAGNOSIS — I10 Essential (primary) hypertension: Secondary | ICD-10-CM

## 2021-06-14 NOTE — Discharge Instructions (Signed)
It was our pleasure to provide your ER care today - we hope that you feel better.  Drink plenty of fluids/stay well hydrated.   Your blood pressure is high today - continue your meds, limit salt intake, and follow up with primary care doctor in the coming week - call office to arrange appointment.   Return to ER if worse, unable to void/unable to empty bladder, pain with urinating, fevers, abdominal pain, increased trouble breathing, or other concern.

## 2021-06-14 NOTE — ED Triage Notes (Signed)
Pt rolled in bed and pulled her foley catheter, she said it broke. Is supposed to have it removed on monday

## 2021-06-14 NOTE — ED Notes (Signed)
Patient provided with bus pass at time of discharge. States she will call her roommate in the lobby to provide transport home.

## 2021-06-14 NOTE — ED Provider Notes (Signed)
[facility] DEPT Provider Note   CSN: [id number] Arrival date & time: 06/14/21  0530     History  Chief Complaint  Patient presents with   urinary catheter fell out    Jessica Mercado is a 61 y.o. female.  Patient indicates her foley catheter fell out this AM.  Pt indicates was placed at outside hospital last week when treated for pna. States cough is improved. No sob or chest pain. States catheter inadvertently fell out this AM when got tugged on. Has voided since, and indicates seemed normal,  felt as if was able to empty bladder, denies dysuria. No hematuria. No suprapubic or flank pain. No fever or chills.  No prior hx urinary retention or foley catheter placement.  Pt brought foley with her, the entire catheter is intact/out.   The history is provided by the patient and medical records.      Home Medications Prior to Admission medications   Medication Sig Start Date End Date Taking? Authorizing Provider  amoxicillin-clavulanate (AUGMENTIN) 500-125 MG tablet Take 1 tablet (500 mg total) by mouth in the morning and at bedtime. 04/01/20   Mercy Riding, MD  atorvastatin (LIPITOR) 40 MG tablet Take 40 mg by mouth daily. 12/21/17   [provider]  BD PEN NEEDLE NANO 2ND GEN 32G X 4 MM MISC USE AS DIRECTED 2 TIMES DAILY 06/02/20   [provider]  FLUoxetine (PROZAC) 20 MG capsule Take 60 mg by mouth every morning. 08/09/20   [provider]  FLUoxetine (PROZAC) 40 MG capsule Take 40 mg by mouth every morning. 09/04/18   [provider]  gabapentin (NEURONTIN) 800 MG tablet Take 800 mg by mouth 3 (three) times daily. 12/21/17   [provider]  glipiZIDE (GLUCOTROL) 10 MG tablet Take 10 mg by mouth 2 (two) times daily. 02/06/20   [provider]  HUMALOG MIX 75/25 KWIKPEN (75-25) 100 UNIT/ML Kwikpen Inject 40 Units into the skin 2 (two) times daily.  12/21/17   [provider]  hydrOXYzine  (ATARAX/VISTARIL) 25 MG tablet Take 75 mg by mouth daily.  11/06/17   [provider]  LATUDA 40 MG TABS tablet Take 40 mg by mouth at bedtime. 03/27/20   [provider]  metoprolol succinate (TOPROL-XL) 50 MG 24 hr tablet Take 50 mg by mouth daily. 12/21/17   [provider]  nitroGLYCERIN (NITRODUR - DOSED IN MG/24 HR) 0.2 mg/hr patch Apply 1/4th patch to affected shoulder, change daily 09/05/19   Hudnall, Sharyn Lull, MD  OZEMPIC 0.25 or 0.5 MG/DOSE SOPN Inject 0.25 mg into the skin once a week. Take on Saturday 01/04/18   [provider]  pantoprazole (PROTONIX) 20 MG tablet TAKE 1 TABLET BY MOUTH EVERY DAY IN THE MORNING 07/18/20   [provider]  pentoxifylline (TRENTAL) 400 MG CR tablet Take 400 mg by mouth 2 (two) times daily. 06/12/20   [provider]  pramipexole (MIRAPEX) 0.125 MG tablet Take 0.125 mg by mouth every evening. 12/21/17   [provider]  prazosin (MINIPRESS) 1 MG capsule Take 1 mg by mouth every evening. 12/22/17   [provider]  silver sulfADIAZINE (SILVADENE) 1 % cream Apply 1 application topically daily. 02/06/20   [provider]  traZODone (DESYREL) 100 MG tablet Take 300 mg by mouth at bedtime.  12/21/17   [provider]  Vitamin D, Ergocalciferol, (DRISDOL) 50000 units CAPS capsule Take 50,000 Units by mouth every Friday. 11/06/17  [provider]      Allergies    Vancomycin    Review of Systems   Review of Systems  Constitutional:  Negative for chills and fever.  Respiratory:  Negative for shortness of breath.   Gastrointestinal:  Negative for abdominal pain.  Genitourinary:  Negative for dysuria, flank pain and hematuria.  Musculoskeletal:  Negative for back pain.   Physical Exam Updated Vital Signs BP (!) 190/96    Pulse 75    Temp 98.2 F (36.8 C) (Oral)    Resp 16    Ht 1.702 m (5\' 7" )    Wt 99.8 kg    LMP  (LMP Unknown)    SpO2 95%    BMI 34.46 kg/m  Physical  Exam Vitals and nursing note reviewed.  Constitutional:      Appearance: Normal appearance. She is well-developed.  HENT:     Head: Atraumatic.     Nose: Nose normal.     Mouth/Throat:     Mouth: Mucous membranes are moist.  Eyes:     General: No scleral icterus.    Conjunctiva/sclera: Conjunctivae normal.  Neck:     Trachea: No tracheal deviation.  Cardiovascular:     Rate and Rhythm: Normal rate.     Pulses: Normal pulses.     Heart sounds: Normal heart sounds. No murmur heard.   No friction rub. No gallop.  Pulmonary:     Effort: Pulmonary effort is normal. No respiratory distress.     Breath sounds: Normal breath sounds.  Abdominal:     General: Bowel sounds are normal. There is no distension.     Palpations: Abdomen is soft.     Tenderness: There is no abdominal tenderness.  Genitourinary:    Comments: No cva tenderness.  Musculoskeletal:        General: No swelling.     Cervical back: Neck supple. No muscular tenderness.  Skin:    General: Skin is warm and dry.     Findings: No rash.  Neurological:     Mental Status: She is alert.     Comments: Alert, speech normal.   Psychiatric:        Mood and Affect: Mood normal.    ED Results / Procedures / Treatments   Labs (all labs ordered are listed, but only abnormal results are displayed) Labs Reviewed - No data to display  EKG None  Radiology No results found.  Procedures Procedures    Medications Ordered in ED Medications - No data to display  ED Course/ Medical Decision Making/ A&P                           Medical Decision Making  Foley catheter is here, it is out, it is completely intact.   Pt has voided since without difficulty and currently denies any gu c/o.   Reviewed nursing notes and prior charts for additional history.   Patient currently appears stable for d/c.    Rec pcp f/u, including  re bp which is high today.  Return precautions provided.           Final Clinical  Impression(s) / ED Diagnoses Final diagnoses:  None    Rx / DC Orders ED Discharge Orders     None         Lajean Saver, MD 06/14/21 0730

## 2021-07-18 ENCOUNTER — Encounter (HOSPITAL_COMMUNITY): Payer: Self-pay

## 2021-07-18 ENCOUNTER — Inpatient Hospital Stay (HOSPITAL_COMMUNITY)
Admission: EM | Admit: 2021-07-18 | Discharge: 2021-07-24 | DRG: 682 | Disposition: A | Discharge status: Home Health | Payer: Medicaid Other | Attending: Internal Medicine | Admitting: Internal Medicine

## 2021-07-18 ENCOUNTER — Other Ambulatory Visit: Payer: Self-pay

## 2021-07-18 ENCOUNTER — Observation Stay (HOSPITAL_COMMUNITY): Payer: Medicaid Other

## 2021-07-18 ENCOUNTER — Emergency Department (HOSPITAL_COMMUNITY): Payer: Medicaid Other

## 2021-07-18 DIAGNOSIS — R0902 Hypoxemia: Secondary | ICD-10-CM

## 2021-07-18 DIAGNOSIS — N179 Acute kidney failure, unspecified: Secondary | ICD-10-CM | POA: Diagnosis not present

## 2021-07-18 DIAGNOSIS — H25813 Combined forms of age-related cataract, bilateral: Secondary | ICD-10-CM | POA: Diagnosis present

## 2021-07-18 DIAGNOSIS — E1122 Type 2 diabetes mellitus with diabetic chronic kidney disease: Secondary | ICD-10-CM | POA: Diagnosis present

## 2021-07-18 DIAGNOSIS — Z7984 Long term (current) use of oral hypoglycemic drugs: Secondary | ICD-10-CM

## 2021-07-18 DIAGNOSIS — Z6838 Body mass index (BMI) 38.0-38.9, adult: Secondary | ICD-10-CM

## 2021-07-18 DIAGNOSIS — D631 Anemia in chronic kidney disease: Secondary | ICD-10-CM | POA: Diagnosis not present

## 2021-07-18 DIAGNOSIS — I160 Hypertensive urgency: Secondary | ICD-10-CM | POA: Diagnosis not present

## 2021-07-18 DIAGNOSIS — E669 Obesity, unspecified: Secondary | ICD-10-CM | POA: Diagnosis present

## 2021-07-18 DIAGNOSIS — I248 Other forms of acute ischemic heart disease: Secondary | ICD-10-CM | POA: Diagnosis not present

## 2021-07-18 DIAGNOSIS — N1832 Chronic kidney disease, stage 3b: Secondary | ICD-10-CM

## 2021-07-18 DIAGNOSIS — G629 Polyneuropathy, unspecified: Secondary | ICD-10-CM | POA: Diagnosis present

## 2021-07-18 DIAGNOSIS — E1142 Type 2 diabetes mellitus with diabetic polyneuropathy: Secondary | ICD-10-CM | POA: Diagnosis present

## 2021-07-18 DIAGNOSIS — N189 Chronic kidney disease, unspecified: Secondary | ICD-10-CM | POA: Diagnosis present

## 2021-07-18 DIAGNOSIS — H35033 Hypertensive retinopathy, bilateral: Secondary | ICD-10-CM | POA: Diagnosis not present

## 2021-07-18 DIAGNOSIS — E875 Hyperkalemia: Secondary | ICD-10-CM | POA: Diagnosis present

## 2021-07-18 DIAGNOSIS — I130 Hypertensive heart and chronic kidney disease with heart failure and stage 1 through stage 4 chronic kidney disease, or unspecified chronic kidney disease: Secondary | ICD-10-CM | POA: Diagnosis present

## 2021-07-18 DIAGNOSIS — E1136 Type 2 diabetes mellitus with diabetic cataract: Secondary | ICD-10-CM | POA: Diagnosis not present

## 2021-07-18 DIAGNOSIS — R7989 Other specified abnormal findings of blood chemistry: Secondary | ICD-10-CM | POA: Diagnosis present

## 2021-07-18 DIAGNOSIS — Z881 Allergy status to other antibiotic agents status: Secondary | ICD-10-CM

## 2021-07-18 DIAGNOSIS — I10 Essential (primary) hypertension: Secondary | ICD-10-CM | POA: Diagnosis present

## 2021-07-18 DIAGNOSIS — E785 Hyperlipidemia, unspecified: Secondary | ICD-10-CM

## 2021-07-18 DIAGNOSIS — R0600 Dyspnea, unspecified: Secondary | ICD-10-CM | POA: Insufficient documentation

## 2021-07-18 DIAGNOSIS — E119 Type 2 diabetes mellitus without complications: Secondary | ICD-10-CM

## 2021-07-18 DIAGNOSIS — K219 Gastro-esophageal reflux disease without esophagitis: Secondary | ICD-10-CM | POA: Diagnosis not present

## 2021-07-18 DIAGNOSIS — Z6837 Body mass index (BMI) 37.0-37.9, adult: Secondary | ICD-10-CM

## 2021-07-18 DIAGNOSIS — R8290 Unspecified abnormal findings in urine: Secondary | ICD-10-CM | POA: Diagnosis present

## 2021-07-18 DIAGNOSIS — Z833 Family history of diabetes mellitus: Secondary | ICD-10-CM

## 2021-07-18 DIAGNOSIS — Z6834 Body mass index (BMI) 34.0-34.9, adult: Secondary | ICD-10-CM

## 2021-07-18 DIAGNOSIS — F419 Anxiety disorder, unspecified: Secondary | ICD-10-CM | POA: Diagnosis not present

## 2021-07-18 DIAGNOSIS — F32A Depression, unspecified: Secondary | ICD-10-CM | POA: Diagnosis not present

## 2021-07-18 DIAGNOSIS — E113293 Type 2 diabetes mellitus with mild nonproliferative diabetic retinopathy without macular edema, bilateral: Secondary | ICD-10-CM | POA: Diagnosis not present

## 2021-07-18 DIAGNOSIS — N185 Chronic kidney disease, stage 5: Secondary | ICD-10-CM | POA: Diagnosis not present

## 2021-07-18 DIAGNOSIS — J810 Acute pulmonary edema: Secondary | ICD-10-CM

## 2021-07-18 DIAGNOSIS — I5033 Acute on chronic diastolic (congestive) heart failure: Secondary | ICD-10-CM | POA: Diagnosis not present

## 2021-07-18 DIAGNOSIS — E118 Type 2 diabetes mellitus with unspecified complications: Secondary | ICD-10-CM

## 2021-07-18 DIAGNOSIS — Z20822 Contact with and (suspected) exposure to covid-19: Secondary | ICD-10-CM | POA: Diagnosis present

## 2021-07-18 DIAGNOSIS — N19 Unspecified kidney failure: Secondary | ICD-10-CM

## 2021-07-18 DIAGNOSIS — Z79899 Other long term (current) drug therapy: Secondary | ICD-10-CM

## 2021-07-18 DIAGNOSIS — I132 Hypertensive heart and chronic kidney disease with heart failure and with stage 5 chronic kidney disease, or end stage renal disease: Secondary | ICD-10-CM | POA: Diagnosis present

## 2021-07-18 DIAGNOSIS — R778 Other specified abnormalities of plasma proteins: Secondary | ICD-10-CM | POA: Diagnosis present

## 2021-07-18 DIAGNOSIS — Z794 Long term (current) use of insulin: Secondary | ICD-10-CM

## 2021-07-18 HISTORY — DX: Hyperlipidemia, unspecified: E78.5

## 2021-07-18 LAB — COMPREHENSIVE METABOLIC PANEL
ALT: 28 U/L (ref 0–44)
AST: 27 U/L (ref 15–41)
Albumin: 2.7 g/dL — ABNORMAL LOW (ref 3.5–5.0)
Alkaline Phosphatase: 65 U/L (ref 38–126)
Anion gap: 7 (ref 5–15)
BUN: 41 mg/dL — ABNORMAL HIGH (ref 6–20)
CO2: 25 mmol/L (ref 22–32)
Calcium: 8.4 mg/dL — ABNORMAL LOW (ref 8.9–10.3)
Chloride: 103 mmol/L (ref 98–111)
Creatinine, Ser: 4.69 mg/dL — ABNORMAL HIGH (ref 0.44–1.00)
GFR, Estimated: 10 mL/min — ABNORMAL LOW (ref >60)
Glucose, Bld: 88 mg/dL (ref 70–99)
Potassium: 4.7 mmol/L (ref 3.5–5.1)
Sodium: 135 mmol/L (ref 135–145)
Total Bilirubin: 0.4 mg/dL (ref 0.3–1.2)
Total Protein: 6.6 g/dL (ref 6.5–8.1)

## 2021-07-18 LAB — CBC WITH DIFFERENTIAL/PLATELET
Abs Immature Granulocytes: 0.03 10*3/uL (ref 0.00–0.07)
Basophils Absolute: 0.1 10*3/uL (ref 0.0–0.1)
Basophils Relative: 1 %
Eosinophils Absolute: 0.3 10*3/uL (ref 0.0–0.5)
Eosinophils Relative: 4 %
HCT: 32.9 % — ABNORMAL LOW (ref 36.0–46.0)
Hemoglobin: 10.6 g/dL — ABNORMAL LOW (ref 12.0–15.0)
Immature Granulocytes: 0 %
Lymphocytes Relative: 23 %
Lymphs Abs: 1.9 10*3/uL (ref 0.7–4.0)
MCH: 29 pg (ref 26.0–34.0)
MCHC: 32.2 g/dL (ref 30.0–36.0)
MCV: 90.1 fL (ref 80.0–100.0)
Monocytes Absolute: 0.5 10*3/uL (ref 0.1–1.0)
Monocytes Relative: 6 %
Neutro Abs: 5.4 10*3/uL (ref 1.7–7.7)
Neutrophils Relative %: 66 %
Platelets: 292 10*3/uL (ref 150–400)
RBC: 3.65 MIL/uL — ABNORMAL LOW (ref 3.87–5.11)
RDW: 15.2 % (ref 11.5–15.5)
WBC: 8.2 10*3/uL (ref 4.0–10.5)
nRBC: 0 % (ref 0.0–0.2)

## 2021-07-18 LAB — RESP PANEL BY RT-PCR (FLU A&B, COVID) ARPGX2
Influenza A by PCR: NEGATIVE
Influenza B by PCR: NEGATIVE
SARS Coronavirus 2 by RT PCR: NEGATIVE

## 2021-07-18 LAB — GLUCOSE, CAPILLARY
Glucose-Capillary: 154 mg/dL — ABNORMAL HIGH (ref 70–99)
Glucose-Capillary: 164 mg/dL — ABNORMAL HIGH (ref 70–99)

## 2021-07-18 LAB — TROPONIN I (HIGH SENSITIVITY)
Troponin I (High Sensitivity): 84 ng/L — ABNORMAL HIGH (ref <18)
Troponin I (High Sensitivity): 10 ng/L (ref <18)

## 2021-07-18 LAB — BRAIN NATRIURETIC PEPTIDE: B Natriuretic Peptide: 1020.2 pg/mL — ABNORMAL HIGH (ref 0.0–100.0)

## 2021-07-18 MED ORDER — NITROGLYCERIN 0.4 MG SL SUBL: 0.4000 mg | SUBLINGUAL_TABLET | SUBLINGUAL | Status: DC | PRN

## 2021-07-18 MED ORDER — ONDANSETRON HCL 4 MG/2ML IJ SOLN
4.0000 mg | Freq: Once | INTRAMUSCULAR | Status: AC
  Administered 2021-07-18: 4 mg via INTRAVENOUS
  Filled 2021-07-18: qty 2

## 2021-07-18 MED ORDER — FUROSEMIDE 10 MG/ML IJ SOLN
40.0000 mg | Freq: Once | INTRAMUSCULAR | Status: AC
  Administered 2021-07-18: 40 mg via INTRAVENOUS
  Filled 2021-07-18: qty 4

## 2021-07-18 MED ORDER — ONDANSETRON HCL 4 MG/2ML IJ SOLN
4.0000 mg | Freq: Four times a day (QID) | INTRAMUSCULAR | Status: DC | PRN
Start: 2021-07-18 — End: 2021-07-24

## 2021-07-18 MED ORDER — ENOXAPARIN SODIUM 30 MG/0.3ML IJ SOSY: 30.0000 mg | PREFILLED_SYRINGE | INTRAMUSCULAR | Status: DC

## 2021-07-18 MED ORDER — ONDANSETRON HCL 4 MG PO TABS
4.0000 mg | ORAL_TABLET | Freq: Four times a day (QID) | ORAL | Status: DC | PRN
Start: 2021-07-18 — End: 2021-07-24

## 2021-07-18 MED ORDER — HEPARIN SODIUM (PORCINE) 5000 UNIT/ML IJ SOLN
5000.0000 [IU] | Freq: Three times a day (TID) | INTRAMUSCULAR | Status: DC
  Administered 2021-07-18 – 2021-07-24 (×18): 5000 [IU] via SUBCUTANEOUS
  Filled 2021-07-18 (×19): qty 1

## 2021-07-18 MED ORDER — ACETAMINOPHEN 325 MG PO TABS
650.0000 mg | ORAL_TABLET | Freq: Four times a day (QID) | ORAL | Status: DC | PRN
  Administered 2021-07-19 – 2021-07-21 (×2): 650 mg via ORAL
  Filled 2021-07-18 (×2): qty 2

## 2021-07-18 MED ORDER — FUROSEMIDE 10 MG/ML IJ SOLN
100.0000 mg | Freq: Two times a day (BID) | INTRAVENOUS | Status: DC
  Administered 2021-07-19: 100 mg via INTRAVENOUS
  Filled 2021-07-18 (×4): qty 10

## 2021-07-18 MED ORDER — INSULIN ASPART 100 UNIT/ML IJ SOLN
0.0000 [IU] | Freq: Three times a day (TID) | INTRAMUSCULAR | Status: DC
  Administered 2021-07-19 – 2021-07-20 (×4): 2 [IU] via SUBCUTANEOUS
  Administered 2021-07-20 – 2021-07-21 (×3): 3 [IU] via SUBCUTANEOUS
  Administered 2021-07-21: 1 [IU] via SUBCUTANEOUS
  Administered 2021-07-22: 5 [IU] via SUBCUTANEOUS
  Administered 2021-07-22: 3 [IU] via SUBCUTANEOUS
  Administered 2021-07-22 – 2021-07-23 (×2): 1 [IU] via SUBCUTANEOUS
  Administered 2021-07-23: 2 [IU] via SUBCUTANEOUS
  Administered 2021-07-23: 3 [IU] via SUBCUTANEOUS
  Administered 2021-07-24: 1 [IU] via SUBCUTANEOUS
  Administered 2021-07-24: 2 [IU] via SUBCUTANEOUS

## 2021-07-18 MED ORDER — MORPHINE SULFATE (PF) 4 MG/ML IV SOLN
4.0000 mg | Freq: Once | INTRAVENOUS | Status: AC
  Administered 2021-07-18: 4 mg via INTRAVENOUS
  Filled 2021-07-18: qty 1

## 2021-07-18 MED ORDER — ACETAMINOPHEN 650 MG RE SUPP: 650.0000 mg | Freq: Four times a day (QID) | RECTAL | Status: DC | PRN

## 2021-07-18 NOTE — Progress Notes (Signed)
Patient states she wants her "money cards" locked up. She states "one card has $170 and the other card has $60." Cards placed in security envelope and walked down to security office. Patient is aware she will have these returned at discharge.

## 2021-07-18 NOTE — H&P (Signed)
History and Physical    Patient: Jessica Mercado FBP:[id number] DOB: 07/14/1960 DOA: 07/18/2021 DOS: the patient was seen and examined on 07/18/2021 PCP: Vassie Moment, MD  Patient coming from: Home  Chief Complaint:  Chief Complaint  Patient presents with   Shortness of Breath    HPI: Jessica Mercado is a 61 y.o. female with medical history significant of cataracts, diabetic and hypertensive retinopathy, type II DM, hyperlipidemia, hypertension, class II obesity who is coming to the emergency department with progressively worse dyspnea since yesterday.  She gets frequent lower extremity edema.  She has been having productive cough of whitish/yellowish sputum, but denied sore throat, wheezing or hemoptysis.  She has had on and off nonradiated chest pressure for several months, but denied chest pressure at the time of my examination.  However, had an episode on arrival to the [facility] unit, which resolved with morphine.  No fever, chills, abdominal pain, nausea, emesis, diarrhea, melena or hematochezia.  She gets occasional constipation.  No dysuria, frequency or hematuria.  No polyuria, polydipsia, polyphagia or blurry vision.  ED course: Initial vital signs temperature 98.2 F, pulse 74, respiration 20, BP 170/102 mmHg O2 sat 89% on room air.  The patient received 40 mg of furosemide IVP.  Lab work: CBC*white count 8.2, hemoglobin 10.6 g/dL platelets 292.  Troponin was 84 and then 10 ng/L.  BNP was 1020.2 pg/mL.  CMP showed a BUN of 41, creatinine 4.61 and calcium 8.4 mg/dL.  Albumin was 2.7 g/dL.  The rest of the CMP values were normal.  Imaging: A one-view portable chest radiograph showed shallow inspiration with patchy perihilar and basilar opacities which may reflect edema or other atelectasis/consolidation.  Cardiomegaly is similar to previous imaging studies.  Review of Systems: As mentioned in the history of present illness. All other systems reviewed and are negative.  Past Medical  History:  Diagnosis Date   Cataract    Mixed OU   Diabetes mellitus without complication ([place])    Diabetic retinopathy ([place])    NPDR OU   Hyperlipemia    Hyperlipidemia 07/18/2021   Hypertension    Hypertensive retinopathy    OU   Past Surgical History:  Procedure Laterality Date   ANKLE ARTHROSCOPY WITH OPEN REDUCTION INTERNAL FIXATION (ORIF)     Social History:  reports that she has never smoked. She has never used smokeless tobacco. She reports that she does not currently use alcohol. She reports that she does not currently use drugs.  Allergies  Allergen Reactions   Vancomycin Other (See Comments)    Red man syndrome    Family History  Problem Relation Age of Onset   Diabetes Mother    Diabetes Father    Diabetes Sister     Prior to Admission medications   Medication Sig Start Date End Date Taking? Authorizing Provider  amoxicillin-clavulanate (AUGMENTIN) 500-125 MG tablet Take 1 tablet (500 mg total) by mouth in the morning and at bedtime. 04/01/20   Mercy Riding, MD  atorvastatin (LIPITOR) 40 MG tablet Take 40 mg by mouth Mercado. 12/21/17   [provider]  BD PEN NEEDLE NANO 2ND GEN 32G X 4 MM MISC USE AS DIRECTED 2 TIMES Mercado 06/02/20   [provider]  FLUoxetine (PROZAC) 20 MG capsule Take 60 mg by mouth every morning. 08/09/20   [provider]  FLUoxetine (PROZAC) 40 MG capsule Take 40 mg by mouth every morning. 09/04/18   [provider]  gabapentin (NEURONTIN) 800 MG  tablet Take 800 mg by mouth 3 (three) times Mercado. 12/21/17   [provider]  glipiZIDE (GLUCOTROL) 10 MG tablet Take 10 mg by mouth 2 (two) times Mercado. 02/06/20   [provider]  HUMALOG MIX 75/25 KWIKPEN (75-25) 100 UNIT/ML Kwikpen Inject 40 Units into the skin 2 (two) times Mercado.  12/21/17   [provider]  hydrOXYzine (ATARAX/VISTARIL) 25 MG tablet Take 75 mg by mouth Mercado.  11/06/17   [provider]  LATUDA 40 MG TABS tablet  Take 40 mg by mouth at bedtime. 03/27/20   [provider]  metoprolol succinate (TOPROL-XL) 50 MG 24 hr tablet Take 50 mg by mouth Mercado. 12/21/17   [provider]  nitroGLYCERIN (NITRODUR - DOSED IN MG/24 HR) 0.2 mg/hr patch Apply 1/4th patch to affected shoulder, change Mercado 09/05/19   Hudnall, Sharyn Lull, MD  OZEMPIC 0.25 or 0.5 MG/DOSE SOPN Inject 0.25 mg into the skin once a week. Take on Saturday 01/04/18   [provider]  pantoprazole (PROTONIX) 20 MG tablet TAKE 1 TABLET BY MOUTH EVERY DAY IN THE MORNING 07/18/20   [provider]  pentoxifylline (TRENTAL) 400 MG CR tablet Take 400 mg by mouth 2 (two) times Mercado. 06/12/20   [provider]  pramipexole (MIRAPEX) 0.125 MG tablet Take 0.125 mg by mouth every evening. 12/21/17   [provider]  prazosin (MINIPRESS) 1 MG capsule Take 1 mg by mouth every evening. 12/22/17   [provider]  silver sulfADIAZINE (SILVADENE) 1 % cream Apply 1 application topically Mercado. 02/06/20   [provider]  traZODone (DESYREL) 100 MG tablet Take 300 mg by mouth at bedtime.  12/21/17   [provider]  Vitamin D, Ergocalciferol, (DRISDOL) 50000 units CAPS capsule Take 50,000 Units by mouth every Friday. 11/06/17   [provider]    Physical Exam: Vitals:   07/18/21 1415 07/18/21 1430 07/18/21 1506 07/18/21 1539  BP: (!) 159/89 (!) 164/84 (!) 151/73   Pulse: 73 76 71   Resp: 16  18   Temp:   98.2 F (36.8 C) 97.8 F (36.6 C)  TempSrc:   Oral Oral  SpO2: 99% 97% 98%   Weight:      Height:       Physical Exam Vitals and nursing note reviewed.  Constitutional:      Appearance: She is obese.  HENT:     Head: Normocephalic.     Mouth/Throat:     Mouth: Mucous membranes are moist.  Eyes:     Pupils: Pupils are equal, round, and reactive to light.  Neck:     Vascular: No JVD.  Cardiovascular:     Rate and Rhythm: Normal rate and regular rhythm.  Pulmonary:      Effort: Pulmonary effort is normal.     Breath sounds: Examination of the right-lower field reveals rales. Examination of the left-lower field reveals rales. Rales present. No wheezing.  Abdominal:     General: Bowel sounds are normal.     Palpations: Abdomen is soft.     Tenderness: There is no abdominal tenderness.  Musculoskeletal:     Cervical back: Neck supple.     Right lower leg: No edema.     Left lower leg: No edema.  Skin:    General: Skin is warm and dry.  Neurological:     General: No focal deficit present.     Mental Status: She is alert and oriented to person, place, and time.  Psychiatric:        Mood and Affect: Mood normal.     Data Reviewed:  There are no new results to review at this time.  Assessment and Plan: Principal Problem:   Acute dyspnea Observation/telemetry. Continue supplemental oxygen. Fluid and sodium restriction. Monitor Mercado weights, intake and output. Continue diuretic per nephrology orders. Hold beta-blocker while acutely decompensated. Check echocardiogram in AM.  Active Problems:   AKI (acute kidney injury) ([place]) Superimposed on   Stage 3b chronic kidney disease (CKD) [facility]) Nephrology evaluation appreciated. Continue furosemide per nephrology. Monitor Mercado weights, intake and output. Follow-up renal function electrolytes. Check urinalysis and renal ultrasound.    Anemia associated with chronic renal failure Monitor hematocrit and hemoglobin. Erythropoietin per nephrology as needed.    Type 2 diabetes mellitus (HCC) Needs lifestyle modifications. Carbohydrate modified diet. CBG monitoring with RI SS. Check hemoglobin A1c. Continue home meds pending reconciliation.    Essential hypertension Hold metoprolol due to volume overload.    Hyperlipidemia Continue atorvastatin after med reconciliation performed.    Class 2 obesity Needs lifestyle modifications.    Advance Care Planning:   Code Status: Full Code    Consults: Nephrology Roney Jaffe, MD).  Family Communication:   Severity of Illness: The appropriate patient status for this patient is OBSERVATION. Observation status is judged to be reasonable and necessary in order to provide the required intensity of service to ensure the patient's safety. The patient's presenting symptoms, physical exam findings, and initial radiographic and laboratory data in the context of their medical condition is felt to place them at decreased risk for further clinical deterioration. Furthermore, it is anticipated that the patient will be medically stable for discharge from the hospital within 2 midnights of admission.   Author: Reubin Milan, MD 07/18/2021 3:44 PM  For on call review [email or web address].   This document was prepared using [employer] and may contain some unintended transcription errors.

## 2021-07-18 NOTE — ED Provider Notes (Signed)
[facility] DEPT Provider Note   CSN: [id number] Arrival date & time: 07/18/21  1007     History  Chief Complaint  Patient presents with   Shortness of Breath    Jessica Mercado is a 61 y.o. female.  HPI  61 year old female with past medical history of HTN, HLD, DM presents emergency department shortness of breath, intermittently productive cough.  Patient denies any history of asthma/COPD.  States for the past 2 days she has been having shortness of breath, intermittently productive cough of yellow phlegm.  Denies any hemoptysis.  No anticoagulation.  EMS picked her up and noted that she was hypoxic on room air.  Placed on nasal cannula prior to arrival.  Patient denies any chest/back or flank pain.  She has stable swelling in her lower extremities that have not acutely changed.  She is otherwise been compliant with her medication.  Denies any fever.  Home Medications Prior to Admission medications   Medication Sig Start Date End Date Taking? Authorizing Provider  amoxicillin-clavulanate (AUGMENTIN) 500-125 MG tablet Take 1 tablet (500 mg total) by mouth in the morning and at bedtime. 04/01/20   Mercy Riding, MD  atorvastatin (LIPITOR) 40 MG tablet Take 40 mg by mouth daily. 12/21/17   [provider]  BD PEN NEEDLE NANO 2ND GEN 32G X 4 MM MISC USE AS DIRECTED 2 TIMES DAILY 06/02/20   [provider]  FLUoxetine (PROZAC) 20 MG capsule Take 60 mg by mouth every morning. 08/09/20   [provider]  FLUoxetine (PROZAC) 40 MG capsule Take 40 mg by mouth every morning. 09/04/18   [provider]  gabapentin (NEURONTIN) 800 MG tablet Take 800 mg by mouth 3 (three) times daily. 12/21/17   [provider]  glipiZIDE (GLUCOTROL) 10 MG tablet Take 10 mg by mouth 2 (two) times daily. 02/06/20   [provider]  HUMALOG MIX 75/25 KWIKPEN (75-25) 100 UNIT/ML Kwikpen Inject 40 Units into the skin 2 (two) times daily.   12/21/17   [provider]  hydrOXYzine (ATARAX/VISTARIL) 25 MG tablet Take 75 mg by mouth daily.  11/06/17   [provider]  LATUDA 40 MG TABS tablet Take 40 mg by mouth at bedtime. 03/27/20   [provider]  metoprolol succinate (TOPROL-XL) 50 MG 24 hr tablet Take 50 mg by mouth daily. 12/21/17   [provider]  nitroGLYCERIN (NITRODUR - DOSED IN MG/24 HR) 0.2 mg/hr patch Apply 1/4th patch to affected shoulder, change daily 09/05/19   Hudnall, Sharyn Lull, MD  OZEMPIC 0.25 or 0.5 MG/DOSE SOPN Inject 0.25 mg into the skin once a week. Take on Saturday 01/04/18   [provider]  pantoprazole (PROTONIX) 20 MG tablet TAKE 1 TABLET BY MOUTH EVERY DAY IN THE MORNING 07/18/20   [provider]  pentoxifylline (TRENTAL) 400 MG CR tablet Take 400 mg by mouth 2 (two) times daily. 06/12/20   [provider]  pramipexole (MIRAPEX) 0.125 MG tablet Take 0.125 mg by mouth every evening. 12/21/17   [provider]  prazosin (MINIPRESS) 1 MG capsule Take 1 mg by mouth every evening. 12/22/17   [provider]  silver sulfADIAZINE (SILVADENE) 1 % cream Apply 1 application topically daily. 02/06/20   [provider]  traZODone (DESYREL) 100 MG tablet Take 300 mg by mouth at bedtime.  12/21/17   [provider]  Vitamin D, Ergocalciferol, (DRISDOL) 50000 units CAPS capsule Take 50,000 Units by mouth every Friday. 11/06/17  [provider]      Allergies    Vancomycin    Review of Systems   Review of Systems  Constitutional:  Positive for fatigue. Negative for fever.  Respiratory:  Positive for cough and shortness of breath. Negative for chest tightness.   Cardiovascular:  Positive for leg swelling. Negative for chest pain.  Gastrointestinal:  Negative for abdominal pain, diarrhea and vomiting.  Musculoskeletal:  Negative for back pain.  Skin:  Negative for rash.  Neurological:  Negative for headaches.   Physical  Exam Updated Vital Signs BP (!) 166/83    Pulse 72    Temp 98.2 F (36.8 C) (Oral)    Resp 16    Ht 5\' 7"  (1.702 m)    Wt 101.2 kg    LMP  (LMP Unknown)    SpO2 97%    BMI 34.93 kg/m  Physical Exam Vitals and nursing note reviewed.  Constitutional:      General: She is not in acute distress.    Appearance: Normal appearance. She is not diaphoretic.  HENT:     Head: Normocephalic.     Mouth/Throat:     Mouth: Mucous membranes are moist.  Cardiovascular:     Rate and Rhythm: Normal rate.  Pulmonary:     Effort: Pulmonary effort is normal. No respiratory distress.     Breath sounds: Examination of the right-lower field reveals rales. Examination of the left-lower field reveals rales. Rales present.  Abdominal:     Palpations: Abdomen is soft.     Tenderness: There is no abdominal tenderness.  Musculoskeletal:     Right lower leg: Edema present.     Left lower leg: Edema present.  Skin:    General: Skin is warm.  Neurological:     Mental Status: She is alert and oriented to person, place, and time. Mental status is at baseline.  Psychiatric:        Mood and Affect: Mood normal.    ED Results / Procedures / Treatments   Labs (all labs ordered are listed, but only abnormal results are displayed) Labs Reviewed  RESP PANEL BY RT-PCR (FLU A&B, COVID) ARPGX2  CBC WITH DIFFERENTIAL/PLATELET  COMPREHENSIVE METABOLIC PANEL  BRAIN NATRIURETIC PEPTIDE  TROPONIN I (HIGH SENSITIVITY)    EKG None  Radiology No results found.  Procedures .Critical Care Performed by: Lorelle Gibbs, DO Authorized by: Lorelle Gibbs, DO   Critical care provider statement:    Critical care time (minutes):  45   Critical care was necessary to treat or prevent imminent or life-threatening deterioration of the following conditions:  Respiratory failure and endocrine crisis   Critical care was time spent personally by me on the following activities:  Development of treatment plan with patient or  surrogate, discussions with consultants, evaluation of patient's response to treatment, examination of patient, ordering and review of laboratory studies, ordering and review of radiographic studies, ordering and performing treatments and interventions, pulse oximetry, re-evaluation of patient's condition and review of old charts   I assumed direction of critical care for this patient from another provider in my specialty: no     Care discussed with: admitting provider      Medications Ordered in ED Medications - No data to display  ED Course/ Medical Decision Making/ A&P                           Medical Decision Making Amount and/or Complexity of  Data Reviewed Labs: ordered. Radiology: ordered.  Risk Prescription drug management.   This patient presents to the ED for concern of shortness of breath, this involves an extensive number of treatment options, and is a complaint that carries with it a high risk of complications and morbidity.  The differential diagnosis includes shortness of breath, infection, pneumonia, PE, pulmonary edema, CHF   Additional history obtained: -External records from outside source obtained and reviewed including: Chart review including previous notes, labs, imaging, consultation notes   Lab Tests: -I ordered, reviewed, and interpreted labs.  The pertinent results include: Elevated troponin and BNP, worsening CKD and failure without any acute electrolyte abnormalities   EKG -Sinus rhythm   Imaging Studies ordered: -I ordered imaging studies including chest x-ray -I independently visualized and interpreted imaging which showed cardiomegaly with bilateral interstitial findings consistent with pulmonary edema -I agree with the radiologist interpretation   Medicines ordered and prescription drug management: -I ordered medication including Lasix for pulmonary edema, shortness of breath/hypoxia -Reevaluation of the patient after these medicines showed  that the patient improved -I have reviewed the patients home medicines and have made adjustments as needed   Consultations Obtained: I requested consultation with the nephrology, Dr. Jonnie Finner,  and discussed lab and imaging findings as well as pertinent plan - they recommend: Medical admission to [facility]   ED Course: 61 year old female presents emergency department with worsening shortness of breath.  Noted to be hypoxic on room air down to 88%, currently on 2 L nasal cannula, no acute respiratory distress.  Vitals otherwise stable.  She has rales bilaterally on lung auscultation, lower extremity edema that she feels is not acutely changed.  Work-up is concerning for worsening renal failure without any acute electrolyte abnormality and findings of CHF/pulmonary edema with demand ischemia.  Consulted nephrology with the worsening kidney dysfunction and fluid overload status.  They recommend medical admission to [facility] and will continue to consult.  They recommend IV Lasix and further management.  She is flu and COVID-negative.   Critical Interventions: IV diuretics, nephrology consult, admission   Cardiac Monitoring: The patient was maintained on a cardiac monitor.  I personally viewed and interpreted the cardiac monitored which showed an underlying rhythm of: Sinus rhythm   Reevaluation: After the interventions noted above, I reevaluated the patient and found that they have :improved   Dispostion: Patients evaluation and results requires admission for further treatment and care.  Spoke with hospitalist, reviewed patient's ED course and they accept admission.  Patient agrees with admission plan, offers no new complaints and is stable/unchanged at time of admit.        Final Clinical Impression(s) / ED Diagnoses Final diagnoses:  None    Rx / DC Orders ED Discharge Orders     None         Lorelle Gibbs, DO 07/18/21 1430

## 2021-07-18 NOTE — ED Triage Notes (Signed)
Pt bib ems for SOB x1 day.  Pt denies plum hx of copd or asthma.  Pt on 2L [place] by ems for reported low O2 sats at home on RA.  Pt states periods of tachypnea  at home.  Pt in NAD upon arrival, placed on 2L [place] upon arrival to ed room.

## 2021-07-18 NOTE — Consult Note (Addendum)
Renal Service Consult Note [facility]  Jessica Mercado 07/18/2021 Sol Blazing, MD Requesting Physician: Dr. Dina Rich  Reason for Consult: Renal failure HPI: The patient is a 61 y.o. year-old w/hx of DM2, HTN, HL, anxiety, depression came to ED by EMS for SOB x 1 day. No hx COPD. Low O2 sats at home on RA per EMS.  Having episodes of tachypnea. In ED was in NAD and pt placed on 2L O2. In ED CXR showed pulm edema and serum creat was 4.6 (baseline from 2021 was 1.8).  Patient was given IV lasix $Remove'40mg'bjsweDr$ . Asked to see for renal failure.    Pt seen in ED room. States she was seeing Dr Jessica Mercado then stopped going after the Sept 2022 visit. Seeing her PCP who has told her that her kidneys are getting worse over the last 1-2 yrs.  Was in hospital in [facility] for 1.5 wks for PNA treatment. Lives w/ her son- in-law who helps her w/ cooking and cleaning. Her daughter moved out.  No tob/ etoh. Uses a walker to get around.    ROS - denies CP, no joint pain, no HA, no blurry vision, no rash, no diarrhea, no nausea/ vomiting, no dysuria, no difficulty voiding   Past Medical History  Past Medical History:  Diagnosis Date   Cataract    Mixed OU   Diabetes mellitus without complication ([place])    Diabetic retinopathy ([place])    NPDR OU   Hyperlipemia    Hypertension    Hypertensive retinopathy    OU   Past Surgical History  Past Surgical History:  Procedure Laterality Date   ANKLE ARTHROSCOPY WITH OPEN REDUCTION INTERNAL FIXATION (ORIF)     Family History  Family History  Problem Relation Age of Onset   Diabetes Mother    Diabetes Father    Diabetes Sister    Social History  reports that she has never smoked. She has never used smokeless tobacco. She reports that she does not currently use alcohol. She reports that she does not currently use drugs. Allergies  Allergies  Allergen Reactions   Vancomycin Other (See Comments)    Red man syndrome   Home medications Prior to Admission  medications   Medication Sig Start Date End Date Taking? Authorizing Provider  amoxicillin-clavulanate (AUGMENTIN) 500-125 MG tablet Take 1 tablet (500 mg total) by mouth in the morning and at bedtime. 04/01/20   Mercy Riding, MD  atorvastatin (LIPITOR) 40 MG tablet Take 40 mg by mouth daily. 12/21/17   [provider]  BD PEN NEEDLE NANO 2ND GEN 32G X 4 MM MISC USE AS DIRECTED 2 TIMES DAILY 06/02/20   [provider]  FLUoxetine (PROZAC) 20 MG capsule Take 60 mg by mouth every morning. 08/09/20   [provider]  FLUoxetine (PROZAC) 40 MG capsule Take 40 mg by mouth every morning. 09/04/18   [provider]  gabapentin (NEURONTIN) 800 MG tablet Take 800 mg by mouth 3 (three) times daily. 12/21/17   [provider]  glipiZIDE (GLUCOTROL) 10 MG tablet Take 10 mg by mouth 2 (two) times daily. 02/06/20   [provider]  HUMALOG MIX 75/25 KWIKPEN (75-25) 100 UNIT/ML Kwikpen Inject 40 Units into the skin 2 (two) times daily.  12/21/17   [provider]  hydrOXYzine (ATARAX/VISTARIL) 25 MG tablet Take 75 mg by mouth daily.  11/06/17   [provider]  LATUDA 40 MG TABS tablet Take 40 mg by mouth at bedtime.  03/27/20   [provider]  metoprolol succinate (TOPROL-XL) 50 MG 24 hr tablet Take 50 mg by mouth daily. 12/21/17   [provider]  nitroGLYCERIN (NITRODUR - DOSED IN MG/24 HR) 0.2 mg/hr patch Apply 1/4th patch to affected shoulder, change daily 09/05/19   Hudnall, Sharyn Lull, MD  OZEMPIC 0.25 or 0.5 MG/DOSE SOPN Inject 0.25 mg into the skin once a week. Take on Saturday 01/04/18   [provider]  pantoprazole (PROTONIX) 20 MG tablet TAKE 1 TABLET BY MOUTH EVERY DAY IN THE MORNING 07/18/20   [provider]  pentoxifylline (TRENTAL) 400 MG CR tablet Take 400 mg by mouth 2 (two) times daily. 06/12/20   [provider]  pramipexole (MIRAPEX) 0.125 MG tablet Take 0.125 mg by mouth every evening. 12/21/17    [provider]  prazosin (MINIPRESS) 1 MG capsule Take 1 mg by mouth every evening. 12/22/17   [provider]  silver sulfADIAZINE (SILVADENE) 1 % cream Apply 1 application topically daily. 02/06/20   [provider]  traZODone (DESYREL) 100 MG tablet Take 300 mg by mouth at bedtime.  12/21/17   [provider]  Vitamin D, Ergocalciferol, (DRISDOL) 50000 units CAPS capsule Take 50,000 Units by mouth every Friday. 11/06/17   [provider]     Vitals:   07/18/21 1345 07/18/21 1400 07/18/21 1415 07/18/21 1430  BP: (!) 137/117 (!) 156/101 (!) 159/89 (!) 164/84  Pulse: 73 72 73 76  Resp: $Remo'10 15 16   'pMKrH$ Temp:      TempSrc:      SpO2: 97% 100% 99% 97%  Weight:      Height:       Exam Gen alert, no distress No rash, cyanosis or gangrene Sclera anicteric, throat clear  No jvd or bruits Chest soft bialt rales at the bases RRR no RG Abd soft ntnd no mass or ascites +bs GU deferred MS no joint effusions or deformity Ext tight 2-3+ bilat LE edema from feet to hips Neuro is alert, Ox 3 , nf, gen'd weakness      Home meds include - lipitor 40, prozac, neurontin, glipizide, humalog mix, latuda, toprol xl 50, ntg patch, ozempic, protonix, trental, mirapex, minipress, trazodone, prns/ vits/ supps       Date  Creat   eGFR      2019  1.43   40         Nov 2021  2.29 >> 1.76   25- 33, AKI episode      07/18/21  4.69    today     Na 135  K 4.7  CO2 25  BUN 41  Cr 4.69  Ca 8.4  Alb 2.7  LFT's okay    BNP 1020   Trop 84  WBC 8k  Hb 10.6       CXR  2/23 - IMPRESSION: Shallow inspiration. Patchy perihilar and basilar opacities may reflect edema or other atelectasis/consolidation. Similar cardiomegaly     Assessment/ Plan: AKI on CKD 3b - b/l creat from Nov 2021 was creat 1.76, eGFR 33 ml/min.  Creat here is 4.69 on admission in setting of what looks like decompensated CHF or perhaps just vol overload. Per her PCP her kidneys have been "getting worse"  over the last year of two.  Was seen at [place] last fall but didn't follow up. Suspect this is progression of her CKD. Poor appetite, may be early uremia. No n/v or confusion. No indication for RRT at this  time.  Will try high-dose IV lasix. Will need admission at [facility].   HTN - BP's on the high side, ok to treat but try to keep SBP's > 120 while diuresing.   Volume overload - on [place] and not in distress, CXR +edema and moderate to high LE edema. Diuresis as above.  IDDM Obesity      Kelly Splinter  MD 07/18/2021, 3:02 PM  Recent Labs  Lab 07/18/21 1033  WBC 8.2  HGB 10.6*   Recent Labs  Lab 07/18/21 1033  K 4.7  BUN 41*  CREATININE 4.69*  ALBUMIN 2.7*  CALCIUM 8.4*

## 2021-07-18 NOTE — ED Notes (Signed)
Report given to [facility]. [facility]

## 2021-07-18 NOTE — Progress Notes (Signed)
Patient arrived on to the unit via [facility] from [place]. Patient is alert and oriented 3-4. Patient verbalize that "she understands she may need dialysis so they sent her here". VS obtained by AD and AD spoke to MD about elevated SBP and patient express to AD "my chest has been hurting for months now". Ordered one time dose of morphine and AD gave the one time of morphine at 1725. Patient expresses "the pain has eased off". Call bell is within reach, bed at the lowest position.

## 2021-07-18 NOTE — ED Notes (Signed)
[facility] called and transportation to [facility] arranged.

## 2021-07-19 ENCOUNTER — Observation Stay (HOSPITAL_COMMUNITY): Payer: Medicaid Other

## 2021-07-19 DIAGNOSIS — R0600 Dyspnea, unspecified: Secondary | ICD-10-CM | POA: Diagnosis not present

## 2021-07-19 DIAGNOSIS — F419 Anxiety disorder, unspecified: Secondary | ICD-10-CM | POA: Diagnosis present

## 2021-07-19 DIAGNOSIS — J810 Acute pulmonary edema: Secondary | ICD-10-CM | POA: Diagnosis present

## 2021-07-19 DIAGNOSIS — N189 Chronic kidney disease, unspecified: Secondary | ICD-10-CM | POA: Diagnosis not present

## 2021-07-19 DIAGNOSIS — I248 Other forms of acute ischemic heart disease: Secondary | ICD-10-CM | POA: Diagnosis present

## 2021-07-19 DIAGNOSIS — E785 Hyperlipidemia, unspecified: Secondary | ICD-10-CM | POA: Diagnosis present

## 2021-07-19 DIAGNOSIS — Z7984 Long term (current) use of oral hypoglycemic drugs: Secondary | ICD-10-CM | POA: Diagnosis not present

## 2021-07-19 DIAGNOSIS — D631 Anemia in chronic kidney disease: Secondary | ICD-10-CM | POA: Diagnosis present

## 2021-07-19 DIAGNOSIS — N179 Acute kidney failure, unspecified: Secondary | ICD-10-CM | POA: Diagnosis present

## 2021-07-19 DIAGNOSIS — K219 Gastro-esophageal reflux disease without esophagitis: Secondary | ICD-10-CM | POA: Diagnosis present

## 2021-07-19 DIAGNOSIS — E113293 Type 2 diabetes mellitus with mild nonproliferative diabetic retinopathy without macular edema, bilateral: Secondary | ICD-10-CM | POA: Diagnosis present

## 2021-07-19 DIAGNOSIS — Z794 Long term (current) use of insulin: Secondary | ICD-10-CM | POA: Diagnosis not present

## 2021-07-19 DIAGNOSIS — Z6834 Body mass index (BMI) 34.0-34.9, adult: Secondary | ICD-10-CM | POA: Diagnosis not present

## 2021-07-19 DIAGNOSIS — I509 Heart failure, unspecified: Secondary | ICD-10-CM | POA: Diagnosis not present

## 2021-07-19 DIAGNOSIS — E1122 Type 2 diabetes mellitus with diabetic chronic kidney disease: Secondary | ICD-10-CM | POA: Diagnosis present

## 2021-07-19 DIAGNOSIS — R609 Edema, unspecified: Secondary | ICD-10-CM | POA: Diagnosis not present

## 2021-07-19 DIAGNOSIS — F32A Depression, unspecified: Secondary | ICD-10-CM | POA: Diagnosis present

## 2021-07-19 DIAGNOSIS — Z79899 Other long term (current) drug therapy: Secondary | ICD-10-CM | POA: Diagnosis not present

## 2021-07-19 DIAGNOSIS — I130 Hypertensive heart and chronic kidney disease with heart failure and stage 1 through stage 4 chronic kidney disease, or unspecified chronic kidney disease: Secondary | ICD-10-CM | POA: Diagnosis present

## 2021-07-19 DIAGNOSIS — Z881 Allergy status to other antibiotic agents status: Secondary | ICD-10-CM | POA: Diagnosis not present

## 2021-07-19 DIAGNOSIS — E875 Hyperkalemia: Secondary | ICD-10-CM | POA: Diagnosis present

## 2021-07-19 DIAGNOSIS — Z20822 Contact with and (suspected) exposure to covid-19: Secondary | ICD-10-CM | POA: Diagnosis present

## 2021-07-19 DIAGNOSIS — N1832 Chronic kidney disease, stage 3b: Secondary | ICD-10-CM | POA: Diagnosis present

## 2021-07-19 DIAGNOSIS — N170 Acute kidney failure with tubular necrosis: Secondary | ICD-10-CM | POA: Diagnosis not present

## 2021-07-19 DIAGNOSIS — Z833 Family history of diabetes mellitus: Secondary | ICD-10-CM | POA: Diagnosis not present

## 2021-07-19 DIAGNOSIS — I160 Hypertensive urgency: Secondary | ICD-10-CM

## 2021-07-19 DIAGNOSIS — G629 Polyneuropathy, unspecified: Secondary | ICD-10-CM | POA: Diagnosis present

## 2021-07-19 DIAGNOSIS — R8290 Unspecified abnormal findings in urine: Secondary | ICD-10-CM | POA: Diagnosis present

## 2021-07-19 DIAGNOSIS — R7989 Other specified abnormal findings of blood chemistry: Secondary | ICD-10-CM | POA: Diagnosis not present

## 2021-07-19 DIAGNOSIS — I5033 Acute on chronic diastolic (congestive) heart failure: Secondary | ICD-10-CM | POA: Diagnosis present

## 2021-07-19 LAB — URINALYSIS, ROUTINE W REFLEX MICROSCOPIC
Bilirubin Urine: NEGATIVE
Glucose, UA: NEGATIVE mg/dL
Hgb urine dipstick: NEGATIVE
Ketones, ur: NEGATIVE mg/dL
Nitrite: NEGATIVE
Protein, ur: >=300 mg/dL — AB
Specific Gravity, Urine: 1.008 (ref 1.005–1.030)
WBC, UA: >50 WBC/hpf — ABNORMAL HIGH (ref 0–5)
pH: 6 (ref 5.0–8.0)

## 2021-07-19 LAB — CREATININE, URINE, RANDOM: Creatinine, Urine: 43.58 mg/dL

## 2021-07-19 LAB — ECHOCARDIOGRAM COMPLETE
AR max vel: 1.33 cm2
AV Area VTI: 1.26 cm2
AV Area mean vel: 1.28 cm2
AV Mean grad: 11 mmHg
AV Peak grad: 16.9 mmHg
Ao pk vel: 2.06 m/s
Area-P 1/2: 4.44 cm2
Calc EF: 45.1 %
Height: 65 in
S' Lateral: 3.2 cm
Single Plane A2C EF: 47.8 %
Single Plane A4C EF: 43.1 %
Weight: 3672 oz

## 2021-07-19 LAB — GLUCOSE, CAPILLARY
Glucose-Capillary: 98 mg/dL (ref 70–99)
Glucose-Capillary: 148 mg/dL — ABNORMAL HIGH (ref 70–99)
Glucose-Capillary: 194 mg/dL — ABNORMAL HIGH (ref 70–99)
Glucose-Capillary: 206 mg/dL — ABNORMAL HIGH (ref 70–99)

## 2021-07-19 LAB — SODIUM, URINE, RANDOM: Sodium, Ur: 98 mmol/L

## 2021-07-19 LAB — BASIC METABOLIC PANEL
Anion gap: 8 (ref 5–15)
BUN: 41 mg/dL — ABNORMAL HIGH (ref 6–20)
CO2: 25 mmol/L (ref 22–32)
Calcium: 8.3 mg/dL — ABNORMAL LOW (ref 8.9–10.3)
Chloride: 105 mmol/L (ref 98–111)
Creatinine, Ser: 4.67 mg/dL — ABNORMAL HIGH (ref 0.44–1.00)
GFR, Estimated: 10 mL/min — ABNORMAL LOW (ref >60)
Glucose, Bld: 107 mg/dL — ABNORMAL HIGH (ref 70–99)
Potassium: 5.5 mmol/L — ABNORMAL HIGH (ref 3.5–5.1)
Sodium: 138 mmol/L (ref 135–145)

## 2021-07-19 LAB — D-DIMER, QUANTITATIVE: D-Dimer, Quant: 1.74 ug/mL-FEU — ABNORMAL HIGH (ref 0.00–0.50)

## 2021-07-19 LAB — HEMOGLOBIN A1C
Hgb A1c MFr Bld: 6.6 % — ABNORMAL HIGH (ref 4.8–5.6)
Mean Plasma Glucose: 142.72 mg/dL

## 2021-07-19 LAB — HIV ANTIBODY (ROUTINE TESTING W REFLEX): HIV Screen 4th Generation wRfx: NONREACTIVE

## 2021-07-19 MED ORDER — HYDROXYZINE HCL 25 MG PO TABS: 25.0000 mg | ORAL_TABLET | Freq: Four times a day (QID) | ORAL | Status: DC | PRN

## 2021-07-19 MED ORDER — TRAZODONE HCL 100 MG PO TABS
300.0000 mg | ORAL_TABLET | Freq: Every day | ORAL | Status: DC
  Administered 2021-07-19 – 2021-07-23 (×5): 300 mg via ORAL
  Filled 2021-07-19 (×5): qty 3

## 2021-07-19 MED ORDER — FUROSEMIDE 10 MG/ML IJ SOLN
100.0000 mg | Freq: Three times a day (TID) | INTRAVENOUS | Status: DC
  Administered 2021-07-19 – 2021-07-20 (×3): 100 mg via INTRAVENOUS
  Filled 2021-07-19 (×5): qty 10

## 2021-07-19 MED ORDER — PANTOPRAZOLE SODIUM 20 MG PO TBEC
20.0000 mg | DELAYED_RELEASE_TABLET | Freq: Every day | ORAL | Status: DC
Start: 2021-07-19 — End: 2021-07-24
  Administered 2021-07-19 – 2021-07-24 (×6): 20 mg via ORAL
  Filled 2021-07-19 (×6): qty 1

## 2021-07-19 MED ORDER — ATORVASTATIN CALCIUM 40 MG PO TABS
40.0000 mg | ORAL_TABLET | Freq: Every day | ORAL | Status: DC
  Administered 2021-07-19 – 2021-07-24 (×6): 40 mg via ORAL
  Filled 2021-07-19 (×6): qty 1

## 2021-07-19 MED ORDER — METOPROLOL SUCCINATE ER 50 MG PO TB24
50.0000 mg | ORAL_TABLET | Freq: Every day | ORAL | Status: DC
  Administered 2021-07-19 – 2021-07-20 (×2): 50 mg via ORAL
  Filled 2021-07-19 (×2): qty 1

## 2021-07-19 MED ORDER — GABAPENTIN 100 MG PO CAPS
100.0000 mg | ORAL_CAPSULE | Freq: Three times a day (TID) | ORAL | Status: DC
  Administered 2021-07-19 – 2021-07-24 (×15): 100 mg via ORAL
  Filled 2021-07-19 (×15): qty 1

## 2021-07-19 MED ORDER — LURASIDONE HCL 40 MG PO TABS
40.0000 mg | ORAL_TABLET | Freq: Every day | ORAL | Status: DC
  Administered 2021-07-19 – 2021-07-23 (×5): 40 mg via ORAL
  Filled 2021-07-19 (×6): qty 1

## 2021-07-19 MED ORDER — SODIUM ZIRCONIUM CYCLOSILICATE 10 G PO PACK
10.0000 g | PACK | Freq: Two times a day (BID) | ORAL | Status: DC
  Administered 2021-07-19 – 2021-07-20 (×3): 10 g via ORAL
  Filled 2021-07-19 (×4): qty 1

## 2021-07-19 MED ORDER — FLUOXETINE HCL 20 MG PO CAPS
60.0000 mg | ORAL_CAPSULE | Freq: Every morning | ORAL | Status: DC
  Administered 2021-07-20 – 2021-07-24 (×5): 60 mg via ORAL
  Filled 2021-07-19 (×5): qty 3

## 2021-07-19 NOTE — Progress Notes (Addendum)
[facility] Progress Note  Subjective: seen in room, SOB a little better, voiding overnight. Creat stable 4.6. UOP 1500 since admit.    Vitals:   07/18/21 1655 07/18/21 2019 07/19/21 0000 07/19/21 0400  BP: (!) 175/99 (!) 160/92 122/64 130/70  Pulse: 73 75 67 66  Resp: _0 Temp: 98.2 F (36.8 C) 98.1 F (36.7 C) 98.2 F (36.8 C) 97.8 F (36.6 C)  TempSrc: Oral Oral Oral Oral  SpO2: 100% 95% 96% 96%  Weight: 104.8 kg   104.1 kg  Height: _1  (1.651 m)       Exam:  Gen alert, no distress No jvd or bruits Chest mostly clear, occ rales RRR no RG Abd soft ntnd no mass or ascites +bs Ext 2+ bilat LE edema throughout Neuro is alert, Ox 3 , nf          Home meds include - lipitor 40, prozac, neurontin, glipizide, humalog mix, latuda, toprol xl 50, ntg patch, ozempic, protonix, trental, mirapex, minipress, trazodone, prns/ vits/ supps        Date                       Creat                           eGFR      2019                       1.43                             40                                      Nov 2021                2.29 >> 1.76                25- 33, AKI episode      07/18/21                   4.69                             today      Na 135  K 4.7  CO2 25  BUN 41  Cr 4.69  Ca 8.4  Alb 2.7  LFT's okay    BNP 1020   Trop 84  WBC 8k  Hb 10.6       CXR  2/23 - IMPRESSION: Shallow inspiration. Patchy perihilar and basilar opacities may reflect edema or other atelectasis/consolidation. Similar cardiomegaly         Assessment/ Plan: AKI on CKD 3b - b/l creat from Nov 2021 was creat 1.76, eGFR 33 ml/min.  Creat here is 4.69 on admission in setting probable decompensated CHF or perhaps just vol overload. Seen once at [place] but didn't f/u. Her kidneys have been "getting worse" over the last year or two per her PCP.  Suspect this is progression of renal disease, possibly CKD V. No serious uremic symptoms. Started IV lasix 100 IV bid w/ moderate  response, will ^ to 100 tid. Pt aware of possible need for HD if not  improving or renal failure becomes progressive. Will follow.  HTN - BP's on the high side, ok to treat but try to keep SBP's > 120 while diuresing.   Hyperkalemia - lokelma 10 bid x 4, cont renal diet, diuresis Volume overload - CXR +edema w/ sig LE edema. Diuresis as above.  IDDM Obesity     Kelly Splinter 07/19/2021, 11:25 AM   Recent Labs  Lab 07/18/21 1033 07/19/21 0541  K 4.7 5.5*  BUN 41* 41*  CREATININE 4.69* 4.67*  ALBUMIN 2.7*  --   CALCIUM 8.4* 8.3*  HGB 10.6*  --    Inpatient medications:  heparin injection (subcutaneous)  5,000 Units Subcutaneous Q8H   insulin aspart  0-9 Units Subcutaneous TID WC    furosemide 100 mg (07/19/21 0537)   acetaminophen **OR** acetaminophen, nitroGLYCERIN, ondansetron **OR** ondansetron (ZOFRAN) IV

## 2021-07-19 NOTE — Progress Notes (Signed)
PROGRESS NOTE    DEZI BRAUNER  ZMO:[id number] DOB: March 28, 1961 DOA: 07/18/2021 PCP: Vassie Moment, MD    Brief Narrative:  Jessica Mercado is a 61 year old female with past medical history sniffing for cataracts, DM2, diabetic and hypertensive retinopathy, hyperlipidemia, essential hypertension, class II obesity who presented to [facility] ED on 2/23 with progressive shortness of breath and lower extremity edema.  Patient reports productive cough, whitish/yellowish sputum and intermittent chest pressure for several months.  Denies wheezing/hemoptysis, no fever/chills, no abdominal pain, no nausea/vomiting/diarrhea,, no dysuria, no urinary frequency/hematuria, no polyuria or blurry vision.  In the ED, temperature 98.2 F, HR 74, RR 20, BP 170/102, SPO2 89% on room air.  WBC 8.2, hemoglobin 10.6, platelets 292.  COVID-19 PCR negative peer influenza A/B PCR negative.  Sodium 135, potassium 4.7, chloride 103, CO2 25, glucose 88, BUN 41, creatinine 4.69, AST 27, ALT 28, total bilirubin 0.4.  BNP 1020.2.  High sensitive troponin 84>10. Urinalysis with greater than 300 protein, many bacteria, moderate leukocytes, greater than 50 WBCs.  Chest x-ray with patchy perihilar/basilar opacities consistent with pulmonary edema and cardiomegaly.  Neurology was consulted.  Hospital service consulted for further evaluation and management of pulmonary edema likely secondary to progressive renal dysfunction.  Patient was transferred from [place] [facility] to [facility] for possible need of initiation of HD.   Assessment & Plan:    Acute renal failure on CKD stage IIIb Baseline creatinine November 2021 1.76.  Creatinine now up to 4.69 on admission.  Was lost to follow-up with nephrology outpatient.  Renal ultrasound with no hydronephrosis, increased cortical echogenicity consistent with medical renal disease. --Nephrology following, appreciate assistance --Cr 4.69>4.67 --Lasix 100 mg IV TID --Avoid nephrotoxins, renal dose all  medications --Strict I's and O's and daily weights --BMP daily  Hypertensive urgency Blood pressures elevated 170/102; likely secondary to significant volume overload with progressive renal failure. --Metoprolol succinate 50 mg p.o. daily --IV Lasix as above  Hyperkalemia Potassium 5.5 this morning, likely secondary to renal failure. --Lokelma --IV diuresis as above --Continue to monitor on telemetry --BMP daily  Abnormal urinalysis Urinalysis with moderate leukocytes, many bacteria, greater than 50 WBCs.  Currently patient without any urinary complaints. --Will order urine culture; if develops fever, urinary symptoms low threshold to initiate antibiotics  Elevated D-dimer D-dimer 1.74, patient with asymmetrical swelling of bilateral lower extremities, right greater than left. --Vascular duplex ultrasound ordered  Elevated troponin Troponin initially elevated at 85, repeat down to 10.  Suspect etiology likely secondary to type II demand ischemia in the setting of volume overload as above versus decreased clearance with renal failure. --Continue monitor on telemetry  Type 2 diabetes mellitus Home medications include Ozempic 0.25 mg weekly, Humalog 75-2540 units twice daily, glipizide 10 mg p.o. twice daily.,  A1c 6.6, well controlled. --Holding home regimen for now given borderline hypoglycemia --Sensitive SSI for coverage --CBGs qAC/HS  Peripheral neuropathy: Reduce gabapentin to 100 mg p.o. 3 times daily  Depression/anxiety: --Prozac 60 mg p.o. daily --Latuda 40 mg p.o. daily --Hydroxyzine 25 mg p.o. every 6 hours as needed anxiety  GERD: Protonix 20 mg p.o. daily  Hyperlipidemia --Atorvastatin 40 mg p.o. daily  Insomnia: Trazodone 300 mg p.o. nightly  Weakness/debility/deconditioning: --Seen by PT, recommending home health  Morbid obesity Body mass index is 38.19 kg/m.  Discussed with patient needs for aggressive lifestyle changes/weight loss as this complicates  all facets of care.  Outpatient follow-up with PCP.  May benefit from bariatric evaluation outpatient.   DVT prophylaxis: heparin  injection 5,000 Units Start: 07/18/21 1600    Code Status: Full Code Family Communication: No family present at bedside this morning  Disposition Plan:  Level of care: Telemetry Medical Status is: Inpatient Remains inpatient appropriate because: On IV Lasix, possible need to initiate HD    Consultants:  Nephrology, Dr. Jonnie Finner  Procedures:  None  Antimicrobials:  None   Subjective: Patient seen examined bedside, resting comfortably.  Reports good urine output with IV Lasix.  Creatinine about the same as yesterday.  Continues with mild shortness of breath, generalized fatigue/weakness.  Awaiting nephrology for further evaluation.  No other questions or concerns at this time.  Denies headache, no chest pain, palpitations, no fever/chills/night sweats, no nausea/vomiting/diarrhea, no abdominal pain, no dysuria/increased urinary frequency, no paresthesias.  No acute events overnight per nursing staff.  Objective: Vitals:   07/19/21 0400 07/19/21 1214 07/19/21 1256 07/19/21 1330  BP: 130/70 (!) 142/71    Pulse: 66 74 76 78  Resp: 18 17    Temp: 97.8 F (36.6 C) 98.1 F (36.7 C)    TempSrc: Oral Oral    SpO2: 96% 96% 97% 95%  Weight: 104.1 kg     Height:        Intake/Output Summary (Last 24 hours) at 07/19/2021 1420 Last data filed at 07/19/2021 7654 Gross per 24 hour  Intake 480 ml  Output 1500 ml  Net -1020 ml   Filed Weights   07/18/21 1018 07/18/21 1655 07/19/21 0400  Weight: 101.2 kg 104.8 kg 104.1 kg    Examination:  Physical Exam: GEN: NAD, alert and oriented x 3, obese, chronically ill appearance, appears older than stated age HEENT: NCAT, PERRL, EOMI, sclera clear, MMM PULM: CTAB w/o wheezes/crackles, normal respiratory effort, on room air CV: RRR w/o M/G/R GI: abd soft, NTND, NABS, no R/G/M MSK: Bilateral lower extremity  edema, right greater than left, muscle strength globally intact 5/5 bilateral upper/lower extremities NEURO: CN II-XII intact, no focal deficits, sensation to light touch intact PSYCH: normal mood/affect Integumentary: dry/intact, no rashes or wounds    Data Reviewed: I have personally reviewed following labs and imaging studies  CBC: Recent Labs  Lab 07/18/21 1033  WBC 8.2  NEUTROABS 5.4  HGB 10.6*  HCT 32.9*  MCV 90.1  PLT 650   Basic Metabolic Panel: Recent Labs  Lab 07/18/21 1033 07/19/21 0541  NA 135 138  K 4.7 5.5*  CL 103 105  CO2 25 25  GLUCOSE 88 107*  BUN 41* 41*  CREATININE 4.69* 4.67*  CALCIUM 8.4* 8.3*   GFR: Estimated Creatinine Clearance: 15.5 mL/min (A) (by C-G formula based on SCr of 4.67 mg/dL (H)). Liver Function Tests: Recent Labs  Lab 07/18/21 1033  AST 27  ALT 28  ALKPHOS 65  BILITOT 0.4  PROT 6.6  ALBUMIN 2.7*   No results for input(s): LIPASE, AMYLASE in the last 168 hours. No results for input(s): AMMONIA in the last 168 hours. Coagulation Profile: No results for input(s): INR, PROTIME in the last 168 hours. Cardiac Enzymes: No results for input(s): CKTOTAL, CKMB, CKMBINDEX, TROPONINI in the last 168 hours. BNP (last 3 results) No results for input(s): PROBNP in the last 8760 hours. HbA1C: Recent Labs    07/19/21 0541  HGBA1C 6.6*   CBG: Recent Labs  Lab 07/18/21 1728 07/18/21 2112 07/19/21 0524 07/19/21 1208  GLUCAP 154* 164* 98 148*   Lipid Profile: No results for input(s): CHOL, HDL, LDLCALC, TRIG, CHOLHDL, LDLDIRECT in the last 72 hours. Thyroid Function  Tests: No results for input(s): TSH, T4TOTAL, FREET4, T3FREE, THYROIDAB in the last 72 hours. Anemia Panel: No results for input(s): VITAMINB12, FOLATE, FERRITIN, TIBC, IRON, RETICCTPCT in the last 72 hours. Sepsis Labs: No results for input(s): PROCALCITON, LATICACIDVEN in the last 168 hours.  Recent Results (from the past 240 hour(s))  Resp Panel by RT-PCR  (Flu A&B, Covid) Nasopharyngeal Swab     Status: None   Collection Time: 07/18/21 11:24 AM   Specimen: Nasopharyngeal Swab; Nasopharyngeal(NP) swabs in vial transport medium  Result Value Ref Range Status   SARS Coronavirus 2 by RT PCR NEGATIVE NEGATIVE Final    Comment: (NOTE) SARS-CoV-2 target nucleic acids are NOT DETECTED.  The SARS-CoV-2 RNA is generally detectable in upper respiratory specimens during the acute phase of infection. The lowest concentration of SARS-CoV-2 viral copies this assay can detect is 138 copies/mL. A negative result does not preclude SARS-Cov-2 infection and should not be used as the sole basis for treatment or other patient management decisions. A negative result may occur with  improper specimen collection/handling, submission of specimen other than nasopharyngeal swab, presence of viral mutation(s) within the areas targeted by this assay, and inadequate number of viral copies(<138 copies/mL). A negative result must be combined with clinical observations, patient history, and epidemiological information. The expected result is Negative.  Fact Sheet for Patients:  EntrepreneurPulse.com.au  Fact Sheet for Healthcare Providers:  IncredibleEmployment.be  This test is no t yet approved or cleared by the [place] FDA and  has been authorized for detection and/or diagnosis of SARS-CoV-2 by FDA under an Emergency Use Authorization (EUA). This EUA will remain  in effect (meaning this test can be used) for the duration of the COVID-19 declaration under Section 564(b)(1) of the Act, 21 U.S.C.section 360bbb-3(b)(1), unless the authorization is terminated  or revoked sooner.       Influenza A by PCR NEGATIVE NEGATIVE Final   Influenza B by PCR NEGATIVE NEGATIVE Final    Comment: (NOTE) The Xpert Xpress SARS-CoV-2/FLU/RSV plus assay is intended as an aid in the diagnosis of influenza from Nasopharyngeal swab specimens  and should not be used as a sole basis for treatment. Nasal washings and aspirates are unacceptable for Xpert Xpress SARS-CoV-2/FLU/RSV testing.  Fact Sheet for Patients: EntrepreneurPulse.com.au  Fact Sheet for Healthcare Providers: IncredibleEmployment.be  This test is not yet approved or cleared by the [place] FDA and has been authorized for detection and/or diagnosis of SARS-CoV-2 by FDA under an Emergency Use Authorization (EUA). This EUA will remain in effect (meaning this test can be used) for the duration of the COVID-19 declaration under Section 564(b)(1) of the Act, 21 U.S.C. section 360bbb-3(b)(1), unless the authorization is terminated or revoked.  Performed at [facility] [place] [address], [place], [place] [address]          Radiology Studies: US RENAL  Result Date: 07/18/2021 CLINICAL DATA:  Renal dysfunction EXAM: RENAL / URINARY TRACT ULTRASOUND COMPLETE COMPARISON:  03/31/2020 FINDINGS: Right Kidney: Renal measurements: 7.9 x 5 x 4.5 cm = volume: 91.96 mL. There is increased cortical echogenicity. There is no hydronephrosis. Left Kidney: Renal measurements: 12.1 x 4.8 x 4.2 cm = volume: 127.10 mL. There is increased cortical echogenicity. There is no hydronephrosis. Bladder: Appears normal for degree of bladder distention. Other: None. IMPRESSION: There is no hydronephrosis. There is increased cortical echogenicity in both kidneys suggesting medical renal disease. Electronically Signed   By: Elmer Picker M.D.   On: 07/18/2021 20:02  DG Chest Port 1 View  Result Date: 07/18/2021 CLINICAL DATA:  Shortness of breath EXAM: PORTABLE CHEST 1 VIEW COMPARISON:  03/29/2020 FINDINGS: Shallow inspiration with low lung volumes. Patchy perihilar and basilar opacities. Possible small pleural effusions. Similar cardiomegaly. IMPRESSION: Shallow inspiration. Patchy perihilar and basilar opacities may reflect edema  or other atelectasis/consolidation. Similar cardiomegaly. Electronically Signed   By: Macy Mis M.D.   On: 07/18/2021 11:53        Scheduled Meds:  heparin injection (subcutaneous)  5,000 Units Subcutaneous Q8H   insulin aspart  0-9 Units Subcutaneous TID WC   sodium zirconium cyclosilicate  10 g Oral BID   Continuous Infusions:  furosemide       LOS: 0 days    Time spent: 48 minutes spent on chart review, discussion with nursing staff, consultants, updating family and interview/physical exam; more than 50% of that time was spent in counseling and/or coordination of care.    Cate Oravec J [place] DO [facility] Available via Epic secure chat 7am-7pm After these hours, please refer to coverage provider listed on [email or web address] 07/19/2021, 2:20 PM

## 2021-07-19 NOTE — Evaluation (Signed)
Physical Therapy Evaluation Patient Details Name: Jessica Mercado MRN: [id number] DOB: 1960/11/12 Today's Date: 07/19/2021  History of Present Illness  61 yo admitted 2/23 with dyspnea with aKI. PMHx: DM, HTN, retinopathy, HLD, obesity  Clinical Impression  Pt stating she lives with son-in-law even though he and her daughter divorced and plan to move to [place] in Sept. Pt with ankle fusion, leg length discrepancy and orthopedic shoe which impact her gait at baseline with pt using cane or rollator for mobility at home with assist for donning shoes at home. Pt with decreased activity tolerance, cardiopulmonary function and balance this session who will benefit from acute therapy to maximize mobility and safety. Pt encouraged to be OOB with nursing staff and up to bathroom.   Sats at rest on RA 96% with drop to 87% on RA with transition to sitting EOB During gait sats 89-93% on 2L    HR 78-86   Recommendations for follow up therapy are one component of a multi-disciplinary discharge planning process, led by the attending physician.  Recommendations may be updated based on patient status, additional functional criteria and insurance authorization.  Follow Up Recommendations Home health PT    Assistance Recommended at Discharge Intermittent Supervision/Assistance  Patient can return home with the following  A little help with bathing/dressing/bathroom;Assistance with cooking/housework;Direct supervision/assist for financial management;Direct supervision/assist for medications management    Equipment Recommendations None recommended by PT  Recommendations for Other Services       Functional Status Assessment Patient has had a recent decline in their functional status and demonstrates the ability to make significant improvements in function in a reasonable and predictable amount of time.     Precautions / Restrictions Precautions Precautions: Fall;Other (comment) Precaution Comments: watch sats,  pt with leg length discrepancy and orthopedic shoes      Mobility  Bed Mobility Overal bed mobility: Modified Independent                  Transfers Overall transfer level: Needs assistance   Transfers: Sit to/from Stand Sit to Stand: Supervision           General transfer comment: cues for hand placement not to pull on RW to rise from bed with use of sink to stand from low toilet    Ambulation/Gait Ambulation/Gait assistance: Supervision Gait Distance (Feet): 100 Feet Assistive device: Rolling walker (2 wheels) Gait Pattern/deviations: Step-through pattern, Decreased stride length   Gait velocity interpretation: <1.8 ft/sec, indicate of risk for recurrent falls   General Gait Details: pt with leg length discrepancy and uneven gait with reliance on RW,  cues x 3 to avoid obstacles as pt running into objects on right, cues for breathing technique sats 89-93% on 2L with gait  Stairs            Wheelchair Mobility    Modified Rankin (Stroke Patients Only)       Balance Overall balance assessment: Needs assistance   Sitting balance-Leahy Scale: Fair Sitting balance - Comments: sitting at EOB and toilet   Standing balance support: Bilateral upper extremity supported Standing balance-Leahy Scale: Poor Standing balance comment: bil UE on RW for gait                             Pertinent Vitals/Pain Pain Assessment Pain Assessment: No/denies pain    Home Living Family/patient expects to be discharged to:: Private residence Living Arrangements: Non-relatives/Friends Available Help at Discharge: Family Type  of Home: House Home Access: Level entry       Home Layout: One level Home Equipment: Conservation officer, nature (2 wheels);Cane - single point;Shower seat      Prior Function               Mobility Comments: walks with RW or cane ADLs Comments: assist for putting on socks and shoes     Hand Dominance        Extremity/Trunk  Assessment   Upper Extremity Assessment Upper Extremity Assessment: Generalized weakness    Lower Extremity Assessment Lower Extremity Assessment: Generalized weakness    Cervical / Trunk Assessment Cervical / Trunk Assessment: Kyphotic  Communication   Communication: No difficulties  Cognition Arousal/Alertness: Awake/alert Behavior During Therapy: Flat affect Overall Cognitive Status: Within Functional Limits for tasks assessed                                          General Comments      Exercises     Assessment/Plan    PT Assessment Patient needs continued PT services  PT Problem List Decreased mobility;Decreased safety awareness;Decreased activity tolerance;Cardiopulmonary status limiting activity;Decreased balance;Decreased knowledge of use of DME       PT Treatment Interventions Gait training;Balance training;Functional mobility training;Therapeutic activities;Patient/family education;Therapeutic exercise;DME instruction    PT Goals (Current goals can be found in the Care Plan section)  Acute Rehab PT Goals Patient Stated Goal: return home to my son-in-law to move to [place] PT Goal Formulation: With patient Time For Goal Achievement: 08/02/21 Potential to Achieve Goals: Good    Frequency Min 3X/week     Co-evaluation               AM-PAC PT "6 Clicks" Mobility  Outcome Measure Help needed turning from your back to your side while in a flat bed without using bedrails?: None Help needed moving from lying on your back to sitting on the side of a flat bed without using bedrails?: None Help needed moving to and from a bed to a chair (including a wheelchair)?: A Little Help needed standing up from a chair using your arms (e.g., wheelchair or bedside chair)?: A Little Help needed to walk in hospital room?: A Little Help needed climbing 3-5 steps with a railing? : A Lot 6 Click Score: 19    End of Session Equipment Utilized During  Treatment: Oxygen Activity Tolerance: Patient tolerated treatment well Patient left: in chair;with call bell/phone within reach;with chair alarm set Nurse Communication: Mobility status PT Visit Diagnosis: Other abnormalities of gait and mobility (R26.89);Difficulty in walking, not elsewhere classified (R26.2);Muscle weakness (generalized) (M62.81)    Time: 7169-6789 PT Time Calculation (min) (ACUTE ONLY): 30 min   Charges:   PT Evaluation $PT Eval Moderate Complexity: 1 Mod          Elika Godar P, PT Acute Rehabilitation Services Pager: [phone] Office: [phone]   Jame Morrell B Domonique Cothran 07/19/2021, 1:35 PM

## 2021-07-19 NOTE — TOC Progression Note (Signed)
[facility]) - Progression Note    Patient Details  Name: Jessica Mercado MRN: [id number] Date of Birth: 28-May-1960  [facility]) CM/SW Contact  Zenon Mayo, RN Phone Number: 07/19/2021, 7:59 AM  Clinical Narrative:    From home, acute dyspnea, AKI/CKD, Anemia, DM2, HTN.  Edema.  Conts on lasix 100mg  q12,  TOC will continue to follow for dc needs.         Expected Discharge Plan and Services                                                 Social Determinants of Health (SDOH) Interventions    Readmission Risk Interventions No flowsheet data found.

## 2021-07-19 NOTE — Evaluation (Signed)
Occupational Therapy Evaluation Patient Details Name: Jessica Mercado MRN: [id number] DOB: July 12, 1960 Today's Date: 07/19/2021   History of Present Illness 61 yo admitted 2/23 with dyspnea with aKI. PMHx: DM, HTN, retinopathy, HLD, obesity   Clinical Impression   Prior to this admission, patient living with son-in-law despite daughter and son-in-law being divorced and planning on moving to [place] in September. Patient with ankle fusion, leg length discrepancy and orthopedic shoe which impact her gait at baseline with pt using cane or rollator for mobility at home with assist for donning socks and shoes at home.Currently, patient is at min A for ADLs, and supervision for transfers and ambulation. Patient currently on 1L of oxygen, and presenting with decreased activity tolerance and endurance. OT recommending HHOT at time of discharge, and OT will follow acutely to address problem list outlined below.      Recommendations for follow up therapy are one component of a multi-disciplinary discharge planning process, led by the attending physician.  Recommendations may be updated based on patient status, additional functional criteria and insurance authorization.   Follow Up Recommendations  Home health OT    Assistance Recommended at Discharge Intermittent Supervision/Assistance  Patient can return home with the following A little help with walking and/or transfers;A little help with bathing/dressing/bathroom;Assist for transportation;Help with stairs or ramp for entrance;Assistance with cooking/housework    Functional Status Assessment  Patient has had a recent decline in their functional status and demonstrates the ability to make significant improvements in function in a reasonable and predictable amount of time.  Equipment Recommendations  None recommended by OT (Patient has all DME needed)    Recommendations for Other Services       Precautions / Restrictions Precautions Precautions:  Fall;Other (comment) Precaution Comments: watch sats, pt with leg length discrepancy and orthopedic shoes Restrictions Weight Bearing Restrictions: No      Mobility Bed Mobility               General bed mobility comments: Patient up in chair upon arrival    Transfers Overall transfer level: Needs assistance Equipment used: Rolling walker (2 wheels) Transfers: Sit to/from Stand Sit to Stand: Supervision                  Balance Overall balance assessment: Needs assistance   Sitting balance-Leahy Scale: Fair     Standing balance support: Bilateral upper extremity supported Standing balance-Leahy Scale: Poor Standing balance comment: bil UE on RW for gait                           ADL either performed or assessed with clinical judgement   ADL Overall ADL's : Needs assistance/impaired Eating/Feeding: Independent   Grooming: Set up   Upper Body Bathing: Set up   Lower Body Bathing: Minimal assistance;Sitting/lateral leans   Upper Body Dressing : Set up   Lower Body Dressing: Minimal assistance;Sitting/lateral leans   Toilet Transfer: Ambulation;Supervision/safety   Toileting- Clothing Manipulation and Hygiene: Supervision/safety       Functional mobility during ADLs: Minimal assistance;Rolling walker (2 wheels) General ADL Comments: Patient presenting with decreased activity tolerance and endurance now requiring 1L of oxygen to maintain appropriate sats     Vision Baseline Vision/History: 1 Wears glasses;6 Macular Degeneration Ability to See in Adequate Light: 0 Adequate Patient Visual Report: No change from baseline Additional Comments: Recieves shots in her eyes for her MD     Perception     Praxis  Pertinent Vitals/Pain Pain Assessment Pain Assessment: No/denies pain     Hand Dominance     Extremity/Trunk Assessment Upper Extremity Assessment Upper Extremity Assessment: Generalized weakness;Overall [facility] for tasks  assessed   Lower Extremity Assessment Lower Extremity Assessment: Defer to PT evaluation   Cervical / Trunk Assessment Cervical / Trunk Assessment: Kyphotic   Communication Communication Communication: No difficulties   Cognition Arousal/Alertness: Awake/alert Behavior During Therapy: Flat affect Overall Cognitive Status: Within Functional Limits for tasks assessed                                       General Comments       Exercises     Shoulder Instructions      Home Living Family/patient expects to be discharged to:: Private residence Living Arrangements: Non-relatives/Friends Available Help at Discharge: Family Type of Home: House Home Access: Level entry     Home Layout: One level     Bathroom Shower/Tub: Teacher, early years/pre: Standard     Home Equipment: Conservation officer, nature (2 wheels);Cane - single point;Shower seat          Prior Functioning/Environment Prior Level of Function : Needs assist             Mobility Comments: walks with RW or cane ADLs Comments: assist for putting on socks and shoes        OT Problem List: Decreased strength;Decreased range of motion;Decreased activity tolerance;Impaired balance (sitting and/or standing);Decreased coordination;Decreased safety awareness;Decreased knowledge of use of DME or AE;Decreased knowledge of precautions;Cardiopulmonary status limiting activity;Increased edema      OT Treatment/Interventions: Self-care/ADL training;Therapeutic exercise;Energy conservation;DME and/or AE instruction;Manual therapy;Therapeutic activities;Patient/family education;Balance training    OT Goals(Current goals can be found in the care plan section) Acute Rehab OT Goals Patient Stated Goal: to get this figured out so I can get to [place] OT Goal Formulation: With patient Time For Goal Achievement: 08/02/21 Potential to Achieve Goals: Good  OT Frequency: Min 2X/week    Co-evaluation               AM-PAC OT "6 Clicks" Daily Activity     Outcome Measure Help from another person eating meals?: None Help from another person taking care of personal grooming?: A Little Help from another person toileting, which includes using toliet, bedpan, or urinal?: A Little Help from another person bathing (including washing, rinsing, drying)?: A Little Help from another person to put on and taking off regular upper body clothing?: A Little Help from another person to put on and taking off regular lower body clothing?: A Little 6 Click Score: 19   End of Session Nurse Communication: Mobility status  Activity Tolerance: Patient tolerated treatment well;Patient limited by fatigue Patient left: in chair;with call bell/phone within reach;with chair alarm set  OT Visit Diagnosis: Other abnormalities of gait and mobility (R26.89);Unsteadiness on feet (R26.81);Muscle weakness (generalized) (M62.81)                Time: 3546-5681 OT Time Calculation (min): 19 min Charges:  OT General Charges $OT Visit: 1 Visit OT Evaluation $OT Eval Moderate Complexity: 1 Mod  Corinne Ports E. Edilberto Roosevelt, OTR/L Acute Rehabilitation Services [phone] [place] 07/19/2021, 2:57 PM

## 2021-07-20 ENCOUNTER — Inpatient Hospital Stay (HOSPITAL_COMMUNITY): Payer: Medicaid Other

## 2021-07-20 DIAGNOSIS — E875 Hyperkalemia: Secondary | ICD-10-CM | POA: Diagnosis present

## 2021-07-20 DIAGNOSIS — R7989 Other specified abnormal findings of blood chemistry: Secondary | ICD-10-CM

## 2021-07-20 DIAGNOSIS — D631 Anemia in chronic kidney disease: Secondary | ICD-10-CM

## 2021-07-20 DIAGNOSIS — N179 Acute kidney failure, unspecified: Principal | ICD-10-CM

## 2021-07-20 DIAGNOSIS — R609 Edema, unspecified: Secondary | ICD-10-CM | POA: Diagnosis not present

## 2021-07-20 DIAGNOSIS — N189 Chronic kidney disease, unspecified: Secondary | ICD-10-CM

## 2021-07-20 DIAGNOSIS — N170 Acute kidney failure with tubular necrosis: Secondary | ICD-10-CM

## 2021-07-20 DIAGNOSIS — N1832 Chronic kidney disease, stage 3b: Secondary | ICD-10-CM

## 2021-07-20 DIAGNOSIS — R778 Other specified abnormalities of plasma proteins: Secondary | ICD-10-CM | POA: Diagnosis present

## 2021-07-20 DIAGNOSIS — J810 Acute pulmonary edema: Secondary | ICD-10-CM | POA: Diagnosis not present

## 2021-07-20 DIAGNOSIS — I160 Hypertensive urgency: Secondary | ICD-10-CM

## 2021-07-20 DIAGNOSIS — E669 Obesity, unspecified: Secondary | ICD-10-CM

## 2021-07-20 HISTORY — DX: Acute kidney failure, unspecified: N18.9

## 2021-07-20 HISTORY — DX: Other specified abnormal findings of blood chemistry: R79.89

## 2021-07-20 HISTORY — DX: Hypertensive urgency: I16.0

## 2021-07-20 HISTORY — DX: Chronic kidney disease, unspecified: N17.9

## 2021-07-20 LAB — BASIC METABOLIC PANEL
Anion gap: 10 (ref 5–15)
BUN: 45 mg/dL — ABNORMAL HIGH (ref 6–20)
CO2: 27 mmol/L (ref 22–32)
Calcium: 8.3 mg/dL — ABNORMAL LOW (ref 8.9–10.3)
Chloride: 100 mmol/L (ref 98–111)
Creatinine, Ser: 5.01 mg/dL — ABNORMAL HIGH (ref 0.44–1.00)
GFR, Estimated: 9 mL/min — ABNORMAL LOW (ref >60)
Glucose, Bld: 178 mg/dL — ABNORMAL HIGH (ref 70–99)
Potassium: 4.2 mmol/L (ref 3.5–5.1)
Sodium: 137 mmol/L (ref 135–145)

## 2021-07-20 LAB — CBC
HCT: 28 % — ABNORMAL LOW (ref 36.0–46.0)
Hemoglobin: 8.9 g/dL — ABNORMAL LOW (ref 12.0–15.0)
MCH: 28.8 pg (ref 26.0–34.0)
MCHC: 31.8 g/dL (ref 30.0–36.0)
MCV: 90.6 fL (ref 80.0–100.0)
Platelets: 237 10*3/uL (ref 150–400)
RBC: 3.09 MIL/uL — ABNORMAL LOW (ref 3.87–5.11)
RDW: 14.8 % (ref 11.5–15.5)
WBC: 8.3 10*3/uL (ref 4.0–10.5)
nRBC: 0 % (ref 0.0–0.2)

## 2021-07-20 LAB — GLUCOSE, CAPILLARY
Glucose-Capillary: 162 mg/dL — ABNORMAL HIGH (ref 70–99)
Glucose-Capillary: 167 mg/dL — ABNORMAL HIGH (ref 70–99)
Glucose-Capillary: 218 mg/dL — ABNORMAL HIGH (ref 70–99)
Glucose-Capillary: 172 mg/dL — ABNORMAL HIGH (ref 70–99)
Glucose-Capillary: 157 mg/dL — ABNORMAL HIGH (ref 70–99)

## 2021-07-20 MED ORDER — FUROSEMIDE 10 MG/ML IJ SOLN
80.0000 mg | Freq: Three times a day (TID) | INTRAMUSCULAR | Status: DC
  Administered 2021-07-20 – 2021-07-22 (×6): 80 mg via INTRAVENOUS
  Filled 2021-07-20 (×6): qty 8

## 2021-07-20 MED ORDER — METOPROLOL TARTRATE 25 MG PO TABS
25.0000 mg | ORAL_TABLET | Freq: Two times a day (BID) | ORAL | Status: DC
  Administered 2021-07-20 – 2021-07-24 (×9): 25 mg via ORAL
  Filled 2021-07-20 (×9): qty 1

## 2021-07-20 NOTE — TOC Initial Note (Signed)
Transition of Care [facility]) - Initial/Assessment Note    Patient Details  Name: Jessica Mercado MRN: [id number] Date of Birth: [birth year]  [facility]) CM/SW Contact:    Zenon Mayo, RN Phone Number: 07/20/2021, 4:42 PM  Clinical Narrative:                 Patient is from home with son n law, she has a walker and a cane at home. She does not have a scale, she states they just brought a bp cuff and will start to check her blood pressure more, she eats a low sodium diet. [facility] will transport her home at discharge.  NCM offered choice for HHPT, she states she does not have a preference.  NCM made referral to [facility] with Alvis Lemmings, he is able to take referral.  Soc will begin 24 to 48 hrs post dc. Can only do HHPT. TOC will continue to follow for dc needs.   Expected Discharge Plan: [place] Barriers to Discharge: Continued Medical Work up   Patient Goals and CMS Choice Patient states their goals for this hospitalization and ongoing recovery are:: return home CMS Medicare.gov Compare Post Acute Care list provided to:: Patient Choice offered to / list presented to : Patient  Expected Discharge Plan and Services Expected Discharge Plan: [place]   Discharge Planning Services: CM Consult Post Acute Care Choice: [place] arrangements for the past 2 months: Single Family Home                   DME Agency: NA       HH Arranged: PT HH Agency: [place] Date [facility] Agency Contacted: 07/20/21 Time HH Agency Contacted: [id number] Representative spoke with at [place]: Tommi Rumps  Prior Living Arrangements/Services Living arrangements for the past 2 months: [place] Lives with:: Relatives Patient language and need for interpreter reviewed:: Yes Do you feel safe going back to the place where you live?: Yes      Need for Family Participation in Patient Care: Yes (Comment) Care giver support system in place?: Yes  (comment) Current home services: DME (has a walker Kasandra Knudsen,) Criminal Activity/Legal Involvement Pertinent to Current Situation/Hospitalization: No - Comment as needed  Activities of Daily Living      Permission Sought/Granted                  Emotional Assessment Appearance:: Appears stated age Attitude/Demeanor/Rapport: Engaged Affect (typically observed): Appropriate Orientation: : Oriented to  Time, Oriented to Situation, Oriented to Place, Oriented to Self Alcohol / Substance Use: Not Applicable Psych Involvement: No (comment)  Admission diagnosis:  Acute pulmonary edema ([place]) [J81.0] Hypoxia [R09.02] Acute dyspnea [R06.00] Renal failure, unspecified chronicity [N19] Acute renal failure superimposed on stage 3b chronic kidney disease ([place]) [N17.9, N18.32] Acute renal failure (ARF) ([place]) [N17.9] Patient Active Problem List   Diagnosis Date Noted   Acute kidney injury superimposed on CKD 3b(HCC) 07/20/2021   Hypertensive urgency 07/20/2021   Hyperkalemia 07/20/2021   Elevated d-dimer 07/20/2021   Elevated troponin 07/20/2021   Acute renal failure (ARF) ([place]) 07/19/2021   Acute dyspnea 07/18/2021   Type 2 diabetes mellitus ([place]) 07/18/2021   Anemia associated with chronic renal failure 07/18/2021   Hyperlipidemia 07/18/2021   Class 1 obesity 07/18/2021   Stage 3b chronic kidney disease (CKD) ([place]) 07/18/2021   Hyperglycemia due to diabetes mellitus ([place]) 03/30/2020   Essential hypertension 03/30/2020   AKI (  acute kidney injury) ([place]) 03/30/2020   Hyponatremia 03/30/2020   Pneumonia 03/29/2020   Leg swelling 01/03/2020   PCP:  Vassie Moment, MD Pharmacy:   [id number] - [place], [address] [place] [address] Phone: [phone] Fax: [phone]     Social Determinants of Health (SDOH) Interventions    Readmission Risk Interventions Readmission Risk Prevention Plan 07/20/2021  Transportation Screening  Complete  HRI or [place] Complete  Social Work Consult for [place] Planning/Counseling Complete  Palliative Care Screening Not Applicable  Medication Review Press photographer) Complete  Some recent data might be hidden

## 2021-07-20 NOTE — Assessment & Plan Note (Addendum)
-   Home medications include Ozempic, Humalog, glipizide -HbA1c 6.6 -Continue sensitive sliding scale insulin

## 2021-07-20 NOTE — Progress Notes (Signed)
BLE venous duplex has been completed.   Results can be found under chart review under CV PROC. 07/20/2021 12:29 PM Caila Cirelli RVT, RDMS

## 2021-07-20 NOTE — Hospital Course (Signed)
Patient is a 61 year old female with diabetes, hypertension, obesity, presented with progressive shortness of breath and lower extremity edema.  Also reported cough, whitish/yellowish sputum and intermittent chest pressure for several months.  No dysuria, no urinary frequency or hematuria.  In ED BP 170/102, O2 sats 89% on room air.  WBCs 8.2 hemoglobin 10.6.  COVID PCR negative.  Creatinine 4.69.  UA greater than 300 protein many bacteria, moderate leukocytes greater than 50 WBCs.  Chest x-ray with patchy perihilar/basilar opacities consistent with pulmonary edema and cardiomegaly.  Nephrology was consulted.

## 2021-07-20 NOTE — Assessment & Plan Note (Signed)
Continue atorvastatin

## 2021-07-20 NOTE — Progress Notes (Signed)
[facility]                                                                               Jessica Mercado, is a 61 y.o. female, DOB - 10/25/1960, TGG:[id number] Admit date - 07/18/2021    Outpatient Primary MD for the patient is Vassie Moment, MD  LOS - 1  days    Brief summary   Patient is a 61 year old female with diabetes, hypertension, obesity, presented with progressive shortness of breath and lower extremity edema.  Also reported cough, whitish/yellowish sputum and intermittent chest pressure for several months.  No dysuria, no urinary frequency or hematuria.  In ED BP 170/102, O2 sats 89% on room air.  WBCs 8.2 hemoglobin 10.6.  COVID PCR negative.  Creatinine 4.69.  UA greater than 300 protein many bacteria, moderate leukocytes greater than 50 WBCs.  Chest x-ray with patchy perihilar/basilar opacities consistent with pulmonary edema and cardiomegaly.  Nephrology was consulted.   Assessment & Plan    Assessment and Plan: * Acute kidney injury superimposed on CKD 3b(HCC)- (present on admission) - Presented with creatinine of 4.69.  Baseline creatinine in November 2021 was 1.76. -Renal ultrasound with no hydronephrosis, increased cortical echogenicity consistent with medical renal disease. -Nephrology consulted, given volume overloaded, hypertensive urgency, patient was placed on Lasix 100 mg IV 3 times daily  Hyperkalemia- (present on admission) - K5.5 on 2/24, received Lokelma, now improved 4.2  Hypertensive urgency- (present on admission) - BP 170/102 likely secondary to significant volume overload and progressive renal failure -Continue Lopressor 25 mg twice daily, IV Lasix  Elevated d-dimer- (present on admission) - D-dimer 1.74 with asymmetric swelling of the bilateral lower extremities right greater than left -Venous Dopplers negative for DVT  Anemia associated with chronic renal failure- (present on admission) - H&H currently stable, anemia likely due to  CKD stage IIIb  Type 2 diabetes mellitus (HCC) - Home medications include Ozempic, Humalog, glipizide -HbA1c 6.6 -Continue sensitive sliding scale insulin while inpatient  Elevated troponin- (present on admission) - Likely due to demand ischemia in the setting of volume overload and decreased clearance with renal failure -Currently no chest pain, stable  Class 1 obesity- (present on admission) - Body mass index is 37.68 kg/m.  Hyperlipidemia- (present on admission) - Continue atorvastatin   Code Status: Full code DVT Prophylaxis:  heparin injection 5,000 Units Start: 07/18/21 1600   Level of Care: Level of care: Telemetry Medical Disposition Plan:     Remains inpatient appropriate: On IV Lasix  Procedures:  None  Consultants:   Nephrology   Medications   atorvastatin  40 mg Oral Daily   FLUoxetine  60 mg Oral q morning   furosemide  80 mg Intravenous Q8H   gabapentin  100 mg Oral TID   heparin injection (subcutaneous)  5,000 Units Subcutaneous Q8H   insulin aspart  0-9 Units Subcutaneous TID WC   lurasidone  40 mg Oral QHS   metoprolol tartrate  25 mg Oral BID   pantoprazole  20 mg Oral Daily   sodium zirconium cyclosilicate  10 g Oral BID   traZODone  300 mg Oral QHS     Subjective:  Jessica Mercado was seen and examined today.  No acute complaints.  Patient denies dizziness, chest pain, shortness of breath, abdominal pain, N/V/D/C,.  Objective:   Vitals:   07/20/21 0400 07/20/21 0527 07/20/21 0727 07/20/21 1118  BP: 130/67  140/74 (!) 154/78  Pulse: 69  73 71  Resp: 15  16 15   Temp: 98.2 F (36.8 C)  98.2 F (36.8 C) 98.2 F (36.8 C)  TempSrc: Oral  Oral Oral  SpO2: 99%  96%   Weight:  102.7 kg    Height:        Intake/Output Summary (Last 24 hours) at 07/20/2021 1255 Last data filed at 07/20/2021 0800 Gross per 24 hour  Intake 700 ml  Output 3300 ml  Net -2600 ml   Filed Weights   07/18/21 1655 07/19/21 0400 07/20/21 0527  Weight: 104.8 kg  104.1 kg 102.7 kg     Exam General: Alert and oriented x 3, NAD Cardiovascular: S1 S2 auscultated, no murmurs, RRR Respiratory: Diminished breath sound at the bases Gastrointestinal: Soft, nontender, nondistended, + bowel sounds Ext: bilateral lower extremity edema right greater than left Neuro: no new deficits Skin: No rashes Psych: Normal affect and demeanor, alert and oriented x3    Data Reviewed:  I have personally reviewed following labs    CBC Lab Results  Component Value Date   WBC 8.3 07/20/2021   RBC 3.09 (L) 07/20/2021   HGB 8.9 (L) 07/20/2021   HCT 28.0 (L) 07/20/2021   MCV 90.6 07/20/2021   MCH 28.8 07/20/2021   PLT 237 07/20/2021   MCHC 31.8 07/20/2021   RDW 14.8 07/20/2021   LYMPHSABS 1.9 07/18/2021   MONOABS 0.5 07/18/2021   EOSABS 0.3 07/18/2021   BASOSABS 0.1 77/41/2878     Last metabolic panel Lab Results  Component Value Date   NA 137 07/20/2021   K 4.2 07/20/2021   CL 100 07/20/2021   CO2 27 07/20/2021   BUN 45 (H) 07/20/2021   CREATININE 5.01 (H) 07/20/2021   GLUCOSE 178 (H) 07/20/2021   GFRNONAA 9 (L) 07/20/2021   GFRAA 47 (L) 01/07/2018   CALCIUM 8.3 (L) 07/20/2021   PHOS 2.7 04/01/2020   PROT 6.6 07/18/2021   ALBUMIN 2.7 (L) 07/18/2021   BILITOT 0.4 07/18/2021   ALKPHOS 65 07/18/2021   AST 27 07/18/2021   ALT 28 07/18/2021   ANIONGAP 10 07/20/2021    CBG (last 3)  Recent Labs    07/20/21 0546 07/20/21 0611 07/20/21 1117  GLUCAP 167* 162* 218*      Coagulation Profile: No results for input(s): INR, PROTIME in the last 168 hours.   Radiology Studies: I have personally reviewed the imaging studies  US RENAL  Result Date: 07/18/2021 CLINICAL DATA:  Renal dysfunction EXAM: RENAL / URINARY TRACT ULTRASOUND COMPLETE COMPARISON:  03/31/2020 FINDINGS: Right Kidney: Renal measurements: 7.9 x 5 x 4.5 cm = volume: 91.96 mL. There is increased cortical echogenicity. There is no hydronephrosis. Left Kidney: Renal measurements:  12.1 x 4.8 x 4.2 cm = volume: 127.10 mL. There is increased cortical echogenicity. There is no hydronephrosis. Bladder: Appears normal for degree of bladder distention. Other: None. IMPRESSION: There is no hydronephrosis. There is increased cortical echogenicity in both kidneys suggesting medical renal disease. Electronically Signed   By: Elmer Picker M.D.   On: 07/18/2021 20:02   ECHOCARDIOGRAM COMPLETE  Result Date: 07/19/2021    ECHOCARDIOGRAM REPORT   Patient Name:   JARELLY RINCK Date of Exam: 07/19/2021 Medical Rec #:  [id number]      Height:       65.0 in Accession #:    [id number]     Weight:       229.5 lb Date of Birth:  01-Jun-1960      BSA:          2.097 m Patient Age:    13 years       BP:           122/64 mmHg Patient Gender: F              HR:           69 bpm. Exam Location:  Inpatient Procedure: 2D Echo, Cardiac Doppler and Color Doppler Indications:    CHF  History:        Patient has no prior history of Echocardiogram examinations.                 Risk Factors:Hypertension and Diabetes.  Sonographer:    Jyl Heinz Referring Phys: [id number] DAVID MANUEL Arab  1. Left ventricular ejection fraction, by estimation, is 45 to 50%. The left ventricle has mildly decreased function. The left ventricle demonstrates global hypokinesis. Left ventricular diastolic parameters are consistent with Grade II diastolic dysfunction (pseudonormalization).  2. Right ventricular systolic function is normal. The right ventricular size is normal.  3. A small pericardial effusion is present. The pericardial effusion is circumferential. There is no evidence of cardiac tamponade. Moderate pleural effusion in the left lateral region.  4. The mitral valve is normal in structure. Mild mitral valve regurgitation. No evidence of mitral stenosis.  5. The aortic valve is normal in structure. There is mild calcification of the aortic valve. There is mild thickening of the aortic valve. Aortic valve  regurgitation is not visualized. Aortic valve sclerosis is present, with no evidence of aortic valve stenosis. Aortic valve Vmax measures 2.06 m/s.  6. The inferior vena cava is normal in size with greater than 50% respiratory variability, suggesting right atrial pressure of 3 mmHg. FINDINGS  Left Ventricle: Left ventricular ejection fraction, by estimation, is 45 to 50%. The left ventricle has mildly decreased function. The left ventricle demonstrates global hypokinesis. The left ventricular internal cavity size was normal in size. There is  no left ventricular hypertrophy. Left ventricular diastolic parameters are consistent with Grade II diastolic dysfunction (pseudonormalization). Right Ventricle: The right ventricular size is normal. No increase in right ventricular wall thickness. Right ventricular systolic function is normal. Left Atrium: Left atrial size was normal in size. Right Atrium: Right atrial size was normal in size. Pericardium: A small pericardial effusion is present. The pericardial effusion is circumferential. There is no evidence of cardiac tamponade. Mitral Valve: The mitral valve is normal in structure. Mild mitral valve regurgitation. No evidence of mitral valve stenosis. Tricuspid Valve: The tricuspid valve is normal in structure. Tricuspid valve regurgitation is not demonstrated. No evidence of tricuspid stenosis. Aortic Valve: The aortic valve is normal in structure. There is mild calcification of the aortic valve. There is mild thickening of the aortic valve. Aortic valve regurgitation is not visualized. Aortic valve sclerosis is present, with no evidence of aortic valve stenosis. Aortic valve mean gradient measures 11.0 mmHg. Aortic valve peak gradient measures 16.9 mmHg. Aortic valve area, by VTI measures 1.26 cm. Pulmonic Valve: The pulmonic valve was normal in structure. Pulmonic valve regurgitation is trivial. No evidence of pulmonic stenosis. Aorta: The aortic root is normal in  size and structure. Venous: The inferior  vena cava is normal in size with greater than 50% respiratory variability, suggesting right atrial pressure of 3 mmHg. IAS/Shunts: No atrial level shunt detected by color flow Doppler. Additional Comments: There is a moderate pleural effusion in the left lateral region.  LEFT VENTRICLE PLAX 2D LVIDd:         4.60 cm      Diastology LVIDs:         3.20 cm      LV e' medial:    3.59 cm/s LV PW:         1.10 cm      LV E/e' medial:  26.5 LV IVS:        1.10 cm      LV e' lateral:   4.56 cm/s LVOT diam:     2.00 cm      LV E/e' lateral: 20.9 LV SV:         68 LV SV Index:   32 LVOT Area:     3.14 cm  LV Volumes (MOD) LV vol d, MOD A2C: 115.0 ml LV vol d, MOD A4C: 112.0 ml LV vol s, MOD A2C: 60.0 ml LV vol s, MOD A4C: 63.7 ml LV SV MOD A2C:     55.0 ml LV SV MOD A4C:     112.0 ml LV SV MOD BP:      52.5 ml RIGHT VENTRICLE             IVC RV Basal diam:  2.90 cm     IVC diam: 2.00 cm RV Mid diam:    2.10 cm RV S prime:     11.60 cm/s TAPSE (M-mode): 2.4 cm LEFT ATRIUM             Index        RIGHT ATRIUM           Index LA diam:        4.00 cm 1.91 cm/m   RA Area:     15.70 cm LA Vol (A2C):   58.1 ml 27.70 ml/m  RA Volume:   39.30 ml  18.74 ml/m LA Vol (A4C):   50.2 ml 23.94 ml/m LA Biplane Vol: 54.9 ml 26.18 ml/m  AORTIC VALVE AV Area (Vmax):    1.33 cm AV Area (Vmean):   1.28 cm AV Area (VTI):     1.26 cm AV Vmax:           205.67 cm/s AV Vmean:          162.333 cm/s AV VTI:            0.538 m AV Peak Grad:      16.9 mmHg AV Mean Grad:      11.0 mmHg LVOT Vmax:         87.10 cm/s LVOT Vmean:        65.950 cm/s LVOT VTI:          0.216 m LVOT/AV VTI ratio: 0.40  AORTA Ao Root diam: 2.90 cm Ao Asc diam:  3.30 cm MITRAL VALVE MV Area (PHT): 4.44 cm    SHUNTS MV Decel Time: 171 msec    Systemic VTI:  0.22 m MV E velocity: 95.20 cm/s  Systemic Diam: 2.00 cm MV A velocity: 81.00 cm/s MV E/A ratio:  1.18 Candee Furbish MD Electronically signed by Candee Furbish MD Signature  Date/Time: 07/19/2021/4:02:36 PM    Final    VAS [place] LOWER EXTREMITY VENOUS (DVT)  Result Date: 07/20/2021  Lower  Venous DVT Study Patient Name:  Jessica Mercado  Date of Exam:   07/20/2021 Medical Rec #: [id number]       Accession #:    [id number] Date of Birth: [birth year]       Patient Gender: F Patient Age:   40 years Exam Location:  [facility] Procedure:      VAS [place] LOWER EXTREMITY VENOUS (DVT) Referring Phys: ERIC [place] --------------------------------------------------------------------------------  Indications: Edema, and elevated D-dimer (1.74).  Limitations: Body habitus and poor ultrasound/tissue interface. Comparison Study: Previous exam (LLEV) on 04/01/2019 Performing Technologist: Rogelia Rohrer RVT, RDMS  Examination Guidelines: A complete evaluation includes B-mode imaging, spectral Doppler, color Doppler, and power Doppler as needed of all accessible portions of each vessel. Bilateral testing is considered an integral part of a complete examination. Limited examinations for reoccurring indications may be performed as noted. The reflux portion of the exam is performed with the patient in reverse Trendelenburg.  +---------+---------------+---------+-----------+----------+--------------+  RIGHT     Compressibility Phasicity Spontaneity Properties Thrombus Aging  +---------+---------------+---------+-----------+----------+--------------+  CFV       Full            Yes       Yes                                    +---------+---------------+---------+-----------+----------+--------------+  SFJ       Full                                                             +---------+---------------+---------+-----------+----------+--------------+  FV Prox   Full            Yes       Yes                                    +---------+---------------+---------+-----------+----------+--------------+  FV Mid    Full            Yes       Yes                                     +---------+---------------+---------+-----------+----------+--------------+  FV Distal Full            Yes       Yes                                    +---------+---------------+---------+-----------+----------+--------------+  PFV       Full                                                             +---------+---------------+---------+-----------+----------+--------------+  POP       Full            Yes       Yes                                    +---------+---------------+---------+-----------+----------+--------------+  PTV       Full                                                             +---------+---------------+---------+-----------+----------+--------------+  PERO      Full                                                             +---------+---------------+---------+-----------+----------+--------------+   +---------+---------------+---------+-----------+----------+--------------+  LEFT      Compressibility Phasicity Spontaneity Properties Thrombus Aging  +---------+---------------+---------+-----------+----------+--------------+  CFV       Full            Yes       Yes                                    +---------+---------------+---------+-----------+----------+--------------+  SFJ       Full                                                             +---------+---------------+---------+-----------+----------+--------------+  FV Prox   Full            Yes       Yes                                    +---------+---------------+---------+-----------+----------+--------------+  FV Mid    Full            Yes       Yes                                    +---------+---------------+---------+-----------+----------+--------------+  FV Distal Full            Yes       Yes                                    +---------+---------------+---------+-----------+----------+--------------+  PFV       Full                                                              +---------+---------------+---------+-----------+----------+--------------+  POP       Full            Yes       Yes                                    +---------+---------------+---------+-----------+----------+--------------+  PTV       Full                                                             +---------+---------------+---------+-----------+----------+--------------+  PERO      Full                                                             +---------+---------------+---------+-----------+----------+--------------+     Summary: BILATERAL: - No evidence of deep vein thrombosis seen in the lower extremities, bilaterally. - No evidence of superficial venous thrombosis in the lower extremities, bilaterally. -No evidence of popliteal cyst, bilaterally.   *See table(s) above for measurements and observations. Electronically signed by Monica Martinez MD on 07/20/2021 at 12:53:53 PM.    Final        Estill Cotta M.D. [facility] 07/20/2021, 12:55 PM  Available via Epic secure chat 7am-7pm After 7 pm, please refer to night coverage provider listed on amion.

## 2021-07-20 NOTE — Assessment & Plan Note (Addendum)
-  Stable overall -Continue current antihypertensive agents (Lopressor and Lasix). -Heart healthy/low-sodium diet discussed with patient. 

## 2021-07-20 NOTE — Assessment & Plan Note (Signed)
-   Likely due to demand ischemia in the setting of volume overload and decreased clearance with renal failure -Currently no chest pain, stable

## 2021-07-20 NOTE — Assessment & Plan Note (Deleted)
-   K5.5 on 2/24, received Lokelma -Now improved

## 2021-07-20 NOTE — Progress Notes (Signed)
Patient refused to stand up for standing scale weight. Bed weight obtained.

## 2021-07-20 NOTE — Assessment & Plan Note (Signed)
-   D-dimer 1.74 with asymmetric swelling of the bilateral lower extremities right greater than left -Venous Dopplers negative for DVT

## 2021-07-20 NOTE — Assessment & Plan Note (Addendum)
-   Hemoglobin remained stable

## 2021-07-20 NOTE — Progress Notes (Signed)
[facility] Progress Note  Subjective: seen in room, voiding more, 2700 UOP yest, 1800 today so far. Creat up slightly.   Vitals:   07/20/21 0400 07/20/21 0527 07/20/21 0727 07/20/21 1118  BP: 130/67  140/74 (!) 154/78  Pulse: 69  73 71  Resp: $Remo'15  16 15  'MoTbi$ Temp: 98.2 F (36.8 C)  98.2 F (36.8 C) 98.2 F (36.8 C)  TempSrc: Oral  Oral Oral  SpO2: 99%  96%   Weight:  102.7 kg    Height:        Exam:  Gen alert, no distress No jvd or bruits Chest mostly clear, occ rales RRR no RG Abd soft ntnd no mass or ascites +bs Ext 2+ bilat LE edema throughout Neuro is alert, Ox 3 , nf          Home meds include - lipitor 40, prozac, neurontin, glipizide, humalog mix, latuda, toprol xl 50, ntg patch, ozempic, protonix, trental, mirapex, minipress, trazodone, prns/ vits/ supps        Date                       Creat                           eGFR      2019                       1.43                             40                                      Nov 2021                2.29 >> 1.76                25- 33, AKI episode      07/18/21                   4.69                             today      Na 135  K 4.7  CO2 25  BUN 41  Cr 4.69  Ca 8.4  Alb 2.7  LFT's okay    BNP 1020   Trop 84  WBC 8k  Hb 10.6       CXR  2/23 - IMPRESSION: Shallow inspiration. Patchy perihilar and basilar opacities may reflect edema or other atelectasis/consolidation. Similar cardiomegaly         Assessment/ Plan: AKI on CKD 3b - b/l creat from Nov 2021 was creat 1.76, eGFR 33 ml/min.  Creat here is 4.69 on admission in setting probable decompensated CHF or perhaps just vol overload. Seen once at [place] but didn't f/u. Her kidneys have been "getting worse" over the last year or two per her PCP.  Suspect this is progression of renal disease, possibly CKD V. Here w/ sig vol overload and pulm edema. No serious uremic symptoms. Started IV lasix on 2/23. UOP better now, getting volume off. Will lower slightly  to 80 tid w/ slight creat bump. Pt aware of possible  need for HD, but not yet. Cont diuresing. Will follow.  HTN - on metoprolol 25 bid, BP's good. Keep SBP > 110-120 while diuresing.  Hyperkalemia - cont renal diet, diuresis Volume overload - CXR +edema w/ sig LE edema. As above.  IDDM Obesity     Jessica Mercado 07/20/2021, 2:34 PM   Recent Labs  Lab 07/18/21 1033 07/19/21 0541 07/20/21 0308  K 4.7 5.5* 4.2  BUN 41* 41* 45*  CREATININE 4.69* 4.67* 5.01*  ALBUMIN 2.7*  --   --   CALCIUM 8.4* 8.3* 8.3*  HGB 10.6*  --  8.9*    Inpatient medications:  atorvastatin  40 mg Oral Daily   FLUoxetine  60 mg Oral q morning   furosemide  80 mg Intravenous Q8H   gabapentin  100 mg Oral TID   heparin injection (subcutaneous)  5,000 Units Subcutaneous Q8H   insulin aspart  0-9 Units Subcutaneous TID WC   lurasidone  40 mg Oral QHS   metoprolol tartrate  25 mg Oral BID   pantoprazole  20 mg Oral Daily   sodium zirconium cyclosilicate  10 g Oral BID   traZODone  300 mg Oral QHS     acetaminophen **OR** acetaminophen, hydrOXYzine, nitroGLYCERIN, ondansetron **OR** ondansetron (ZOFRAN) IV

## 2021-07-20 NOTE — Assessment & Plan Note (Signed)
Body mass index is 37.68 kg/m.

## 2021-07-20 NOTE — Assessment & Plan Note (Addendum)
-  Presented with creatinine of 4.69.  Baseline creatinine in November 2021 was 1.76.  Volume overload, hypertensive urgency. -Renal ultrasound with no hydronephrosis, increased cortical echogenicity consistent with medical renal disease. -Nephrology following, patient was diuresed on IV Lasix

## 2021-07-21 DIAGNOSIS — N170 Acute kidney failure with tubular necrosis: Secondary | ICD-10-CM | POA: Diagnosis not present

## 2021-07-21 DIAGNOSIS — N189 Chronic kidney disease, unspecified: Secondary | ICD-10-CM | POA: Diagnosis not present

## 2021-07-21 DIAGNOSIS — N179 Acute kidney failure, unspecified: Secondary | ICD-10-CM | POA: Diagnosis not present

## 2021-07-21 DIAGNOSIS — J810 Acute pulmonary edema: Secondary | ICD-10-CM | POA: Diagnosis not present

## 2021-07-21 LAB — GLUCOSE, CAPILLARY
Glucose-Capillary: 141 mg/dL — ABNORMAL HIGH (ref 70–99)
Glucose-Capillary: 239 mg/dL — ABNORMAL HIGH (ref 70–99)
Glucose-Capillary: 217 mg/dL — ABNORMAL HIGH (ref 70–99)
Glucose-Capillary: 168 mg/dL — ABNORMAL HIGH (ref 70–99)

## 2021-07-21 LAB — BASIC METABOLIC PANEL
Anion gap: 9 (ref 5–15)
BUN: 46 mg/dL — ABNORMAL HIGH (ref 6–20)
CO2: 28 mmol/L (ref 22–32)
Calcium: 8 mg/dL — ABNORMAL LOW (ref 8.9–10.3)
Chloride: 99 mmol/L (ref 98–111)
Creatinine, Ser: 5.11 mg/dL — ABNORMAL HIGH (ref 0.44–1.00)
GFR, Estimated: 9 mL/min — ABNORMAL LOW (ref >60)
Glucose, Bld: 136 mg/dL — ABNORMAL HIGH (ref 70–99)
Potassium: 3.6 mmol/L (ref 3.5–5.1)
Sodium: 136 mmol/L (ref 135–145)

## 2021-07-21 NOTE — Progress Notes (Signed)
[facility] Progress Note  Subjective: 4100 cc UOP yesterday. Creat 5.1 today, up 0.1 pts  Vitals:   07/20/21 2005 07/21/21 0443 07/21/21 0903 07/21/21 1057  BP: 139/87 (!) 104/52 (!) 124/57 139/63  Pulse: 74 68 74 70  Resp: $Remo'15 11 18 14  'DHrNY$ Temp: 98.4 F (36.9 C) 98.3 F (36.8 C)  98 F (36.7 C)  TempSrc: Oral Oral  Oral  SpO2: 94% 95% 92% 93%  Weight:  98.3 kg    Height:        Exam:  Gen alert, no distress No jvd or bruits Chest mostly clear, occ rales RRR no RG Abd soft ntnd no mass or ascites +bs Ext 2+ bilat LE edema throughout Neuro is alert, Ox 3 , nf          Home meds include - lipitor 40, prozac, neurontin, glipizide, humalog mix, latuda, toprol xl 50, ntg patch, ozempic, protonix, trental, mirapex, minipress, trazodone, prns/ vits/ supps        Date                       Creat                           eGFR      2019                       1.43                             40                                      Nov 2021                2.29 >> 1.76                25- 33, AKI episode      07/18/21                   4.69                             today      Na 135  K 4.7  CO2 25  BUN 41  Cr 4.69  Ca 8.4  Alb 2.7  LFT's okay    BNP 1020   Trop 84  WBC 8k  Hb 10.6       CXR  2/23 - IMPRESSION: Shallow inspiration. Patchy perihilar and basilar opacities may reflect edema or other atelectasis/consolidation. Similar cardiomegaly         Assessment/ Plan: AKI on CKD 3b - b/l creat from Nov 2021 was creat 1.76, eGFR 33 ml/min.  Creat here is 4.69 on admission in setting probable decompensated CHF or perhaps just vol overload. Seen once at [place] but didn't f/u. Her kidneys have been "getting worse" over the last year or two per her PCP.  Suspect this is progression of renal disease, possibly CKD V. Here w/ sig vol overload and pulm edema. No serious uremic symptoms. Started IV lasix on 2/23. UOP better, 4 L UOP yesterday. Getting vol off and wts are dropping.  Creat stable low 5's w/o signs of uremia. Still quite edematous. Will  cont IV lasix 80 tid. Pt aware of possible need for HD. Will follow.  HTN - on metoprolol 25 bid, BP's good. Keep SBP > 110-120 while diuresing.  Hyperkalemia - cont renal diet Volume overload - CXR +edema 2/23,  w/ sig LE edema. As above.  IDDM Obesity     Rob Marlin Brys 07/21/2021, 2:22 PM   Recent Labs  Lab 07/18/21 1033 07/19/21 0541 07/20/21 0308 07/21/21 0255  K 4.7   < > 4.2 3.6  BUN 41*   < > 45* 46*  CREATININE 4.69*   < > 5.01* 5.11*  ALBUMIN 2.7*  --   --   --   CALCIUM 8.4*   < > 8.3* 8.0*  HGB 10.6*  --  8.9*  --    < > = values in this interval not displayed.    Inpatient medications:  atorvastatin  40 mg Oral Daily   FLUoxetine  60 mg Oral q morning   furosemide  80 mg Intravenous Q8H   gabapentin  100 mg Oral TID   heparin injection (subcutaneous)  5,000 Units Subcutaneous Q8H   insulin aspart  0-9 Units Subcutaneous TID WC   lurasidone  40 mg Oral QHS   metoprolol tartrate  25 mg Oral BID   pantoprazole  20 mg Oral Daily   traZODone  300 mg Oral QHS     acetaminophen **OR** acetaminophen, hydrOXYzine, nitroGLYCERIN, ondansetron **OR** ondansetron (ZOFRAN) IV

## 2021-07-21 NOTE — Progress Notes (Signed)
[facility]                                                                               Jessica Mercado, is a 61 y.o. female, DOB - [birth year], GNF:[id number] Admit date - 07/18/2021    Outpatient Primary MD for the patient is Vassie Moment, MD  LOS - 2  days    Brief summary   Patient is a 61 year old female with diabetes, hypertension, obesity, presented with progressive shortness of breath and lower extremity edema.  Also reported cough, whitish/yellowish sputum and intermittent chest pressure for several months.  No dysuria, no urinary frequency or hematuria.  In ED BP 170/102, O2 sats 89% on room air.  WBCs 8.2 hemoglobin 10.6.  COVID PCR negative.  Creatinine 4.69.  UA greater than 300 protein many bacteria, moderate leukocytes greater than 50 WBCs.  Chest x-ray with patchy perihilar/basilar opacities consistent with pulmonary edema and cardiomegaly.  Nephrology was consulted.   Assessment & Plan    Assessment and Plan: * Acute kidney injury superimposed on CKD 3b(HCC)- (present on admission) - Presented with creatinine of 4.69.  Baseline creatinine in November 2021 was 1.76.  Volume overload, hypertensive urgency. -Renal ultrasound with no hydronephrosis, increased cortical echogenicity consistent with medical renal disease. -Nephrology following, patient on Lasix, decreased to 80 mg IV TID as creatinine trending up.    Hyperkalemia- (present on admission) - K5.5 on 2/24, received Lokelma -Now improved  Hypertensive urgency- (present on admission) - BP 170/102 likely secondary to significant volume overload and progressive renal failure -Continue Lopressor 25 mg twice daily, IV Lasix.  BP currently stable  Elevated d-dimer- (present on admission) - D-dimer 1.74 with asymmetric swelling of the bilateral lower extremities right greater than left -Venous Dopplers negative for DVT  Anemia associated with chronic renal failure- (present on admission) - H&H  currently stable, anemia likely due to CKD stage IIIb  Type 2 diabetes mellitus (HCC) - Home medications include Ozempic, Humalog, glipizide -HbA1c 6.6 -Continue sensitive sliding scale insulin   Elevated troponin- (present on admission) - Likely due to demand ischemia in the setting of volume overload and decreased clearance with renal failure -Currently no chest pain, stable  Class 1 obesity- (present on admission) - Body mass index is 37.68 kg/m.  Hyperlipidemia- (present on admission) - Continue atorvastatin    Code Status: Full CODE STATUS DVT Prophylaxis:  heparin injection 5,000 Units Start: 07/18/21 1600 Level of Care: Level of care: Telemetry Medical Family Communication: No family at the bedside  Disposition Plan:     Remains inpatient appropriate: On IV Lasix for diuresis  Procedures:  None  Consultants:   Nephrology  Antimicrobials:  None Medications   atorvastatin  40 mg Oral Daily   FLUoxetine  60 mg Oral q morning   furosemide  80 mg Intravenous Q8H   gabapentin  100 mg Oral TID   heparin injection (subcutaneous)  5,000 Units Subcutaneous Q8H   insulin aspart  0-9 Units Subcutaneous TID WC   lurasidone  40 mg Oral QHS   metoprolol tartrate  25 mg Oral BID   pantoprazole  20 mg Oral Daily   traZODone  300 mg Oral  QHS     Subjective:   Jessica Mercado was seen and examined today.  No acute complaints.  Negative balance of 6.7 L, still has peripheral edema.  No chest pain or shortness of breath.  No fevers  Objective:   Vitals:   07/20/21 2005 07/21/21 0443 07/21/21 0903 07/21/21 1057  BP: 139/87 (!) 104/52 (!) 124/57 139/63  Pulse: 74 68 74 70  Resp: 15 11 18 14   Temp: 98.4 F (36.9 C) 98.3 F (36.8 C)  98 F (36.7 C)  TempSrc: Oral Oral  Oral  SpO2: 94% 95% 92% 93%  Weight:  98.3 kg    Height:        Intake/Output Summary (Last 24 hours) at 07/21/2021 1247 Last data filed at 07/21/2021 1246 Gross per 24 hour  Intake 840 ml  Output  3500 ml  Net -2660 ml   Filed Weights   07/19/21 0400 07/20/21 0527 07/21/21 0443  Weight: 104.1 kg 102.7 kg 98.3 kg     Exam General: Alert and oriented x 3, NAD Cardiovascular: S1 S2 auscultated, RRR Respiratory: Bibasilar Rales Gastrointestinal: Soft, nontender, nondistended, + bowel sounds Ext: 2+ bilateral lower extremity edema Neuro: no new deficits   Data Reviewed:  I have personally reviewed following labs    CBC Lab Results  Component Value Date   WBC 8.3 07/20/2021   RBC 3.09 (L) 07/20/2021   HGB 8.9 (L) 07/20/2021   HCT 28.0 (L) 07/20/2021   MCV 90.6 07/20/2021   MCH 28.8 07/20/2021   PLT 237 07/20/2021   MCHC 31.8 07/20/2021   RDW 14.8 07/20/2021   LYMPHSABS 1.9 07/18/2021   MONOABS 0.5 07/18/2021   EOSABS 0.3 07/18/2021   BASOSABS 0.1 93/57/0177     Last metabolic panel Lab Results  Component Value Date   NA 136 07/21/2021   K 3.6 07/21/2021   CL 99 07/21/2021   CO2 28 07/21/2021   BUN 46 (H) 07/21/2021   CREATININE 5.11 (H) 07/21/2021   GLUCOSE 136 (H) 07/21/2021   GFRNONAA 9 (L) 07/21/2021   GFRAA 47 (L) 01/07/2018   CALCIUM 8.0 (L) 07/21/2021   PHOS 2.7 04/01/2020   PROT 6.6 07/18/2021   ALBUMIN 2.7 (L) 07/18/2021   BILITOT 0.4 07/18/2021   ALKPHOS 65 07/18/2021   AST 27 07/18/2021   ALT 28 07/18/2021   ANIONGAP 9 07/21/2021    CBG (last 3)  Recent Labs    07/20/21 2033 07/21/21 0501 07/21/21 1055  GLUCAP 157* 141* 239*       Radiology Studies: I have personally reviewed the imaging studies  VAS [place] LOWER EXTREMITY VENOUS (DVT)  Result Date: 07/20/2021 Summary: BILATERAL: - No evidence of deep vein thrombosis seen in the lower extremities, bilaterally. - No evidence of superficial venous thrombosis in the lower extremities, bilaterally. -No evidence of popliteal cyst, bilaterally.   *See table(s) above for measurements and observations. Electronically signed by Monica Martinez MD on 07/20/2021 at 12:53:53 PM.    Final         Estill Cotta M.D. [facility] 07/21/2021, 12:47 PM  Available via Epic secure chat 7am-7pm After 7 pm, please refer to night coverage provider listed on amion.

## 2021-07-22 LAB — GLUCOSE, CAPILLARY
Glucose-Capillary: 148 mg/dL — ABNORMAL HIGH (ref 70–99)
Glucose-Capillary: 203 mg/dL — ABNORMAL HIGH (ref 70–99)
Glucose-Capillary: 279 mg/dL — ABNORMAL HIGH (ref 70–99)
Glucose-Capillary: 140 mg/dL — ABNORMAL HIGH (ref 70–99)

## 2021-07-22 LAB — BASIC METABOLIC PANEL
Anion gap: 8 (ref 5–15)
BUN: 46 mg/dL — ABNORMAL HIGH (ref 6–20)
CO2: 31 mmol/L (ref 22–32)
Calcium: 8.3 mg/dL — ABNORMAL LOW (ref 8.9–10.3)
Chloride: 97 mmol/L — ABNORMAL LOW (ref 98–111)
Creatinine, Ser: 5.71 mg/dL — ABNORMAL HIGH (ref 0.44–1.00)
GFR, Estimated: 8 mL/min — ABNORMAL LOW (ref >60)
Glucose, Bld: 175 mg/dL — ABNORMAL HIGH (ref 70–99)
Potassium: 3.6 mmol/L (ref 3.5–5.1)
Sodium: 136 mmol/L (ref 135–145)

## 2021-07-22 MED ORDER — INSULIN ASPART PROT & ASPART (70-30 MIX) 100 UNIT/ML ~~LOC~~ SUSP
6.0000 [IU] | Freq: Two times a day (BID) | SUBCUTANEOUS | Status: DC
  Administered 2021-07-22 – 2021-07-24 (×4): 6 [IU] via SUBCUTANEOUS
  Filled 2021-07-22: qty 10

## 2021-07-22 NOTE — Progress Notes (Signed)
Inpatient Diabetes Program Recommendations  AACE/ADA: New Consensus Statement on Inpatient Glycemic Control (2015)  Target Ranges:  Prepandial:   less than 140 mg/dL      Peak postprandial:   less than 180 mg/dL (1-2 hours)      Critically ill patients:  140 - 180 mg/dL   Lab Results  Component Value Date   GLUCAP 203 (H) 07/22/2021   HGBA1C 6.6 (H) 07/19/2021    Review of Glycemic Control  Latest Reference Range & Units 07/21/21 05:01 07/21/21 10:55 07/21/21 14:58 07/21/21 21:11 07/22/21 05:44 07/22/21 11:16  Glucose-Capillary 70 - 99 mg/dL 141 (H) 239 (H) 217 (H) 168 (H) 148 (H) 203 (H)  (H): Data is abnormally high  Diabetes history: DM2 Outpatient Diabetes medications: 75/25 40 units bid, Glucotrol 10 mg bid, Ozempic 0.25 mg q week Current orders for Inpatient glycemic control: Novolog 0-9 units tid correction  Inpatient Diabetes Program Recommendations:   Consider: -Add Novolog 70/30 6 units bid ac meals and adjust as needed  Spoke with patient to verify amount of insulin she is taking @ home and she is taking as listed above. Patient states she has been having lows in particular last week and drank large amounts of orange juice to keep CBG up without symptoms. Reviewed with patient need to keep log of CBGs to MD appointments or take glucose meter for review in order to adjust insulin. Patient states her son in law does her CBGs for her and assists her as needed with her care.  Review Novolog 75/25 doses @ discharge.  Thank you, Nani Gasser. Selenia Mihok, RN, MSN, CDE  Diabetes Coordinator Inpatient Glycemic Control Team Team Pager [phone] (8am-5pm) 07/22/2021 11:58 AM

## 2021-07-22 NOTE — Progress Notes (Signed)
[facility] Progress Note  Subjective:  3.7L UOP and 3.1L neg yesterday, -9L from admit, down 7.4kg SCr up to 5.1 from 5.7 Remains on IV lasix, No sig LEE or appreciated sacral edema  Vitals:   07/22/21 0315 07/22/21 0316 07/22/21 0317 07/22/21 0857  BP: (!) 163/79   137/78  Pulse: 62 63 62 67  Resp: $Remo'15 12 15 18  'UhqSr$ Temp: 97.8 F (36.6 C)     TempSrc: Oral     SpO2: 97% 97% 98% 97%  Weight: 97.4 kg     Height:        Exam:  Gen alert, no distress No jvd or bruits Chest mostly clear, occ rales, diminished in the bases RRR no RG Abd soft ntnd no mass or ascites +bs Ext 2+ bilat LE edema throughout Neuro is alert, Ox 3 , nf       Home meds include - lipitor 40, prozac, neurontin, glipizide, humalog mix, latuda, toprol xl 50, ntg patch, ozempic, protonix, trental, mirapex, minipress, trazodone, prns/ vits/ supps        Date                       Creat                           eGFR      2019                       1.43                             40                                      Nov 2021                2.29 >> 1.76                25- 33, AKI episode      07/18/21                   4.69                             today      Na 135  K 4.7  CO2 25  BUN 41  Cr 4.69  Ca 8.4  Alb 2.7  LFT's okay    BNP 1020   Trop 84  WBC 8k  Hb 10.6       CXR  2/23 - IMPRESSION: Shallow inspiration. Patchy perihilar and basilar opacities may reflect edema or other atelectasis/consolidation. Similar cardiomegaly         Assessment/ Plan: AKI on CKD 3b - b/l creat from Nov 2021 was creat 1.76, eGFR 33 ml/min.  Creat here is 4.69 on admission in setting probable decompensated CHF or perhaps just vol overload. Seen once at [place] but didn't f/u. Her kidneys have been "getting worse" over the last year or two per her PCP.  Suspect this is progression of renal disease, possibly CKD V. Here w/ sig vol overload and pulm edema. No serious uremic symptoms. Started IV lasix on 2/23. UOP  better, 4 L UOP yesterday. Getting vol off and wts are  dropping. Now inc SCr and vol seems better.  Hold diuretics for 24h and then plan to resume with PO regimen. Pt aware of possible need for HD. Will follow.  HTN - on metoprolol 25 bid, BP's good. Keep SBP > 110-120 while diuresing.  Hyperkalemia - resolved. cont renal diet Volume overload - CXR +edema 2/23,  w/ sig LE edema. As above.  IDDM Obesity   Rexene Agent, MD  07/22/2021, 10:24 AM   Recent Labs  Lab 07/18/21 1033 07/19/21 0541 07/20/21 0308 07/21/21 0255 07/22/21 0726  K 4.7   < > 4.2 3.6 3.6  BUN 41*   < > 45* 46* 46*  CREATININE 4.69*   < > 5.01* 5.11* 5.71*  ALBUMIN 2.7*  --   --   --   --   CALCIUM 8.4*   < > 8.3* 8.0* 8.3*  HGB 10.6*  --  8.9*  --   --    < > = values in this interval not displayed.    Inpatient medications:  atorvastatin  40 mg Oral Daily   FLUoxetine  60 mg Oral q morning   furosemide  80 mg Intravenous Q8H   gabapentin  100 mg Oral TID   heparin injection (subcutaneous)  5,000 Units Subcutaneous Q8H   insulin aspart  0-9 Units Subcutaneous TID WC   lurasidone  40 mg Oral QHS   metoprolol tartrate  25 mg Oral BID   pantoprazole  20 mg Oral Daily   traZODone  300 mg Oral QHS     acetaminophen **OR** acetaminophen, hydrOXYzine, nitroGLYCERIN, ondansetron **OR** ondansetron (ZOFRAN) IV

## 2021-07-22 NOTE — Progress Notes (Signed)
Occupational Therapy Treatment Patient Details Name: Jessica Mercado MRN: [id number] DOB: Mar 13, 1961 Today's Date: 07/22/2021   History of present illness 61 yo admitted 2/23 with dyspnea with aKI. PMHx: DM, HTN, retinopathy, HLD, obesity   OT comments  Patient continues to make steady progress towards goals in skilled OT session. Patient's session encompassed  functional mobility in order to increase overall activity tolerance. Patient ambulating with rollator, but then feeling "shaky" with OT noticing bilateral tremor in patient's hands. Patient requesting to sit on rollator, and have her sugars checked (RN notified). Patient returning to recliner in room, however increasingly impulsive and rushing and getting herself tangled in her leads. Educated on the importance of taking her time to promote increased safety, patient in acknowledgement. Patient left in chair with chair alarm on, with RN following up at the end of the session. OT will continue to follow.    Recommendations for follow up therapy are one component of a multi-disciplinary discharge planning process, led by the attending physician.  Recommendations may be updated based on patient status, additional functional criteria and insurance authorization.    Follow Up Recommendations  Home health OT    Assistance Recommended at Discharge Intermittent Supervision/Assistance  Patient can return home with the following  A little help with walking and/or transfers;A little help with bathing/dressing/bathroom;Assist for transportation;Help with stairs or ramp for entrance;Assistance with cooking/housework   Equipment Recommendations  None recommended by OT (patient has all DME needed)    Recommendations for Other Services      Precautions / Restrictions Precautions Precautions: Fall;Other (comment) Precaution Comments: watch sats, pt with leg length discrepancy and orthopedic shoes Restrictions Weight Bearing Restrictions: No        Mobility Bed Mobility Overal bed mobility: Modified Independent                  Transfers Overall transfer level: Needs assistance Equipment used: Rollator (4 wheels) Transfers: Sit to/from Stand Sit to Stand: Supervision                 Balance Overall balance assessment: Needs assistance   Sitting balance-Leahy Scale: Fair Sitting balance - Comments: sitting at EOB   Standing balance support: Bilateral upper extremity supported Standing balance-Leahy Scale: Poor Standing balance comment: bil UE on rollator for gait                           ADL either performed or assessed with clinical judgement   ADL Overall ADL's : Needs assistance/impaired                     Lower Body Dressing: Minimal assistance;Sitting/lateral leans Lower Body Dressing Details (indicate cue type and reason): Donning shoe             Functional mobility during ADLs: Minimal assistance;Rollator (4 wheels) General ADL Comments: Patient session focus on functional mobiltiy    Extremity/Trunk Assessment              Vision       Perception     Praxis      Cognition Arousal/Alertness: Awake/alert Behavior During Therapy: Flat affect Overall Cognitive Status: Within Functional Limits for tasks assessed                                          Exercises  Shoulder Instructions       General Comments      Pertinent Vitals/ Pain       Pain Assessment Pain Assessment: No/denies pain  Home Living                                          Prior Functioning/Environment              Frequency  Min 2X/week        Progress Toward Goals  OT Goals(current goals can now be found in the care plan section)  Progress towards OT goals: Progressing toward goals  Acute Rehab OT Goals Patient Stated Goal: to feel better OT Goal Formulation: With patient Time For Goal Achievement: 08/02/21 Potential  to Achieve Goals: Good  Plan Discharge plan remains appropriate    Co-evaluation                 AM-PAC OT "6 Clicks" Daily Activity     Outcome Measure   Help from another person eating meals?: None Help from another person taking care of personal grooming?: A Little Help from another person toileting, which includes using toliet, bedpan, or urinal?: A Little Help from another person bathing (including washing, rinsing, drying)?: A Little Help from another person to put on and taking off regular upper body clothing?: A Little Help from another person to put on and taking off regular lower body clothing?: A Little 6 Click Score: 19    End of Session Equipment Utilized During Treatment: Rollator (4 wheels);Oxygen  OT Visit Diagnosis: Other abnormalities of gait and mobility (R26.89);Unsteadiness on feet (R26.81);Muscle weakness (generalized) (M62.81)   Activity Tolerance Patient limited by fatigue   Patient Left in chair;with call bell/phone within reach;with chair alarm set   Nurse Communication Mobility status;Other (comment) (patient feeling shaky, wanting sugars checked)        Time: 2263-3354 OT Time Calculation (min): 19 min  Charges: OT General Charges $OT Visit: 1 Visit OT Treatments $Self Care/Home Management : 8-22 mins  Corinne Ports E. Hasana Alcorta, OTR/L Acute Rehabilitation Services [phone] [place] 07/22/2021, 3:26 PM

## 2021-07-22 NOTE — Progress Notes (Signed)
Physical Therapy Treatment Patient Details Name: Jessica Mercado MRN: [id number] DOB: 01-Jan-1961 Today's Date: 07/22/2021   History of Present Illness 61 yo admitted 2/23 with dyspnea with AKI. PMHx: DM, HTN, retinopathy, HLD, obesity    PT Comments    The pt was agreeable to session and eager to progress mobility. She tolerated progression of hallway ambulation with use of 4-wheel walker and 1L O2 well with minG for safety. The pt did not request seated rest and required only single short standing rest at this time. Was able to don/doff her shoe without assistance. Will continue to benefit from skilled PT to progress endurance and dynamic stability to improve independence and safety with mobility.   Gait Speed: 0.1m/s using 4-wheel walker and with 1L O2. (Gait speed < 0.39m/s indicates increased risk of falls and dependence in ADLs)    Recommendations for follow up therapy are one component of a multi-disciplinary discharge planning process, led by the attending physician.  Recommendations may be updated based on patient status, additional functional criteria and insurance authorization.  Follow Up Recommendations  Home health PT     Assistance Recommended at Discharge Intermittent Supervision/Assistance  Patient can return home with the following A little help with bathing/dressing/bathroom;Assistance with cooking/housework;Direct supervision/assist for financial management;Direct supervision/assist for medications management   Equipment Recommendations  None recommended by PT    Recommendations for Other Services       Precautions / Restrictions Precautions Precautions: Fall;Other (comment) Precaution Comments: watch sats, pt with leg length discrepancy and orthopedic shoes Restrictions Weight Bearing Restrictions: No     Mobility  Bed Mobility Overal bed mobility: Needs Assistance Bed Mobility: Supine to Sit, Sit to Supine     Supine to sit: Min assist Sit to supine:  Supervision   General bed mobility comments: minA to complete elevation of trunk from flat bed    Transfers Overall transfer level: Needs assistance Equipment used: Rollator (4 wheels) Transfers: Sit to/from Stand Sit to Stand: Supervision           General transfer comment: no assist needed, pt able to rise without assist    Ambulation/Gait Ambulation/Gait assistance: Supervision Gait Distance (Feet): 175 Feet Assistive device: Rollator (4 wheels) Gait Pattern/deviations: Step-through pattern, Decreased stride length Gait velocity: 0.45 m/s Gait velocity interpretation: 1.31 - 2.62 ft/sec, indicative of limited community ambulator   General Gait Details: pt with leg length discrepancy and uneven gait with reliance on RW,  SpO2 stable at 93% on 1L, no overt LOB.      Balance Overall balance assessment: Needs assistance   Sitting balance-Leahy Scale: Fair Sitting balance - Comments: sitting at EOB   Standing balance support: Bilateral upper extremity supported Standing balance-Leahy Scale: Poor Standing balance comment: bil UE on rollator for gait                            Cognition Arousal/Alertness: Awake/alert Behavior During Therapy: Flat affect Overall Cognitive Status: Within Functional Limits for tasks assessed                                          Exercises      General Comments General comments (skin integrity, edema, etc.): VSS on 1L      Pertinent Vitals/Pain Pain Assessment Pain Assessment: No/denies pain     PT Goals (current goals can now  be found in the care plan section) Acute Rehab PT Goals Patient Stated Goal: return home to my son-in-law to move to [place] PT Goal Formulation: With patient Time For Goal Achievement: 08/02/21 Potential to Achieve Goals: Good Progress towards PT goals: Progressing toward goals    Frequency    Min 3X/week      PT Plan Current plan remains appropriate        AM-PAC PT "6 Clicks" Mobility   Outcome Measure  Help needed turning from your back to your side while in a flat bed without using bedrails?: None Help needed moving from lying on your back to sitting on the side of a flat bed without using bedrails?: None Help needed moving to and from a bed to a chair (including a wheelchair)?: A Little Help needed standing up from a chair using your arms (e.g., wheelchair or bedside chair)?: A Little Help needed to walk in hospital room?: A Little Help needed climbing 3-5 steps with a railing? : A Lot 6 Click Score: 19    End of Session Equipment Utilized During Treatment: Gait belt;Oxygen Activity Tolerance: Patient tolerated treatment well Patient left: with call bell/phone within reach;in bed;with bed alarm set Nurse Communication: Mobility status PT Visit Diagnosis: Other abnormalities of gait and mobility (R26.89);Difficulty in walking, not elsewhere classified (R26.2);Muscle weakness (generalized) (M62.81)     Time: 4132-4401 PT Time Calculation (min) (ACUTE ONLY): 25 min  Charges:  $Therapeutic Exercise: 23-37 mins                     Jessica Mercado, PT, DPT   Acute Rehabilitation Department Pager #: [phone]   Jessica Mercado 07/22/2021, 5:14 PM

## 2021-07-22 NOTE — Progress Notes (Signed)
°  Progress Note   Patient: Jessica Mercado IRC:[id number] DOB: 1960/10/30 DOA: 07/18/2021     3 DOS: the patient was seen and examined on 07/22/2021   Brief hospital course: Patient is a 61 year old female with diabetes, hypertension, obesity, presented with progressive shortness of breath and lower extremity edema.  Also reported cough, whitish/yellowish sputum and intermittent chest pressure for several months.  No dysuria, no urinary frequency or hematuria.  In ED BP 170/102, O2 sats 89% on room air.  WBCs 8.2 hemoglobin 10.6.  COVID PCR negative.  Creatinine 4.69.  UA greater than 300 protein many bacteria, moderate leukocytes greater than 50 WBCs.  Chest x-ray with patchy perihilar/basilar opacities consistent with pulmonary edema and cardiomegaly.  Nephrology was consulted.  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  Baseline creatinine in November 2021 was 1.76.  Volume overload, hypertensive urgency. -Renal ultrasound with no hydronephrosis, increased cortical echogenicity consistent with medical renal disease. -Nephrology following, patient was diuresed on IV Lasix -Nephrology planning to hold diuretic for 24 hours  Elevated troponin- (present on admission) - Likely due to demand ischemia in the setting of volume overload and decreased clearance with renal failure -Currently no chest pain, stable  Elevated d-dimer- (present on admission) - D-dimer 1.74 with asymmetric swelling of the bilateral lower extremities right greater than left -Venous Dopplers negative for DVT  Hypertensive urgency- (present on admission) - Continue Lopressor  Class 1 obesity- (present on admission) - Body mass index is 37.68 kg/m.  Hyperlipidemia- (present on admission) - Continue atorvastatin  Anemia associated with chronic renal failure- (present on admission) - Hemoglobin remained stable  Type 2 diabetes  mellitus (HCC) - Home medications include Ozempic, Humalog, glipizide -HbA1c 6.6 -Continue sensitive sliding scale insulin         Subjective: Patient with urine output recorded over 3 L yesterday.  Chest denies any new complaints, states that her legs are less edematous   Physical Exam: Vitals:   07/22/21 0315 07/22/21 0316 07/22/21 0317 07/22/21 0857  BP: (!) 163/79   137/78  Pulse: 62 63 62 67  Resp: 15 12 15 18   Temp: 97.8 F (36.6 C)     TempSrc: Oral     SpO2: 97% 97% 98% 97%  Weight: 97.4 kg     Height:       Examination: General exam: Appears calm and comfortable  Respiratory system: Clear to auscultation. Respiratory effort normal. Cardiovascular system: S1 & S2 heard, RRR.  Nonpitting bilateral pedal edema. Gastrointestinal system: Abdomen is nondistended, soft and nontender. Normal bowel sounds heard. Central nervous system: Alert and oriented. Non focal exam. Speech clear  Extremities: Symmetric in appearance bilaterally  Skin: No rashes, lesions or ulcers on exposed skin  Psychiatry: Judgement and insight appear stable. Mood & affect appropriate.    Data Reviewed:  Creatinine 5.71  Family Communication: No family at bedside  Disposition: Status is: Inpatient Remains inpatient appropriate because: Remains with elevated creatinine          Planned Discharge Destination: Home      Author: Dessa Phi, DO 07/22/2021 11:11 AM  For on call review [email or web address].

## 2021-07-23 LAB — BASIC METABOLIC PANEL
Anion gap: 11 (ref 5–15)
BUN: 47 mg/dL — ABNORMAL HIGH (ref 6–20)
CO2: 29 mmol/L (ref 22–32)
Calcium: 8.3 mg/dL — ABNORMAL LOW (ref 8.9–10.3)
Chloride: 96 mmol/L — ABNORMAL LOW (ref 98–111)
Creatinine, Ser: 5.69 mg/dL — ABNORMAL HIGH (ref 0.44–1.00)
GFR, Estimated: 8 mL/min — ABNORMAL LOW (ref >60)
Glucose, Bld: 121 mg/dL — ABNORMAL HIGH (ref 70–99)
Potassium: 3.5 mmol/L (ref 3.5–5.1)
Sodium: 136 mmol/L (ref 135–145)

## 2021-07-23 LAB — GLUCOSE, CAPILLARY
Glucose-Capillary: 139 mg/dL — ABNORMAL HIGH (ref 70–99)
Glucose-Capillary: 202 mg/dL — ABNORMAL HIGH (ref 70–99)
Glucose-Capillary: 160 mg/dL — ABNORMAL HIGH (ref 70–99)
Glucose-Capillary: 160 mg/dL — ABNORMAL HIGH (ref 70–99)

## 2021-07-23 MED ORDER — TORSEMIDE 20 MG PO TABS
40.0000 mg | ORAL_TABLET | Freq: Every day | ORAL | Status: DC
  Administered 2021-07-23 – 2021-07-24 (×2): 40 mg via ORAL
  Filled 2021-07-23 (×2): qty 2

## 2021-07-23 NOTE — Progress Notes (Signed)
°  Progress Note   Patient: Jessica Mercado WIO:[id number] DOB: 07/12/1960 DOA: 07/18/2021     4 DOS: the patient was seen and examined on 07/23/2021   Brief hospital course: Patient is a 61 year old female with diabetes, hypertension, obesity, presented with progressive shortness of breath and lower extremity edema.  Also reported cough, whitish/yellowish sputum and intermittent chest pressure for several months.  No dysuria, no urinary frequency or hematuria.  In ED BP 170/102, O2 sats 89% on room air.  WBCs 8.2 hemoglobin 10.6.  COVID PCR negative.  Creatinine 4.69.  UA greater than 300 protein many bacteria, moderate leukocytes greater than 50 WBCs.  Chest x-ray with patchy perihilar/basilar opacities consistent with pulmonary edema and cardiomegaly.  Nephrology was consulted.  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  Baseline creatinine in November 2021 was 1.76.  Volume overload, hypertensive urgency. -Renal ultrasound with no hydronephrosis, increased cortical echogenicity consistent with medical renal disease. -Nephrology following, patient was diuresed on IV Lasix  Elevated troponin- (present on admission) - Likely due to demand ischemia in the setting of volume overload and decreased clearance with renal failure -Currently no chest pain, stable  Elevated d-dimer- (present on admission) - D-dimer 1.74 with asymmetric swelling of the bilateral lower extremities right greater than left -Venous Dopplers negative for DVT  Hypertensive urgency- (present on admission) - Continue Lopressor  Class 1 obesity- (present on admission) - Body mass index is 37.68 kg/m.  Hyperlipidemia- (present on admission) - Continue atorvastatin  Anemia associated with chronic renal failure- (present on admission) - Hemoglobin remained stable  Type 2 diabetes mellitus (HCC) - Home medications include Ozempic,  Humalog, glipizide -HbA1c 6.6 -Continue sensitive sliding scale insulin         Subjective: Patient states that she just feels very tired today.  No other specific complaints.   Physical Exam: Vitals:   07/22/21 0857 07/22/21 1117 07/22/21 1953 07/23/21 0353  BP: 137/78 (!) 163/84 (!) 164/87 (!) 123/55  Pulse: 67 65 69 66  Resp: 18 13 16 16   Temp:  98 F (36.7 C) 98.3 F (36.8 C) 98.3 F (36.8 C)  TempSrc:  Oral Oral Oral  SpO2: 97% 98% 97% 94%  Weight:    95.5 kg  Height:       Examination: General exam: Appears calm and comfortable  Respiratory system: Clear to auscultation. Respiratory effort normal. Cardiovascular system: S1 & S2 heard, RRR.  Nonpitting bilateral pedal edema. Gastrointestinal system: Abdomen is nondistended, soft and nontender. Normal bowel sounds heard. Central nervous system: Alert and oriented. Non focal exam. Speech clear  Extremities: Symmetric in appearance bilaterally  Skin: No rashes, lesions or ulcers on exposed skin  Psychiatry: Judgement and insight appear stable. Mood & affect appropriate.    Data Reviewed: Creatinine 5.69, urine output 1600 mL yesterday   Family Communication: No family at bedside  Disposition: Status is: Inpatient Remains inpatient appropriate because: Remains with elevated creatinine          Planned Discharge Destination: Home      Author: Dessa Phi, DO 07/23/2021 10:37 AM  For on call review [email or web address].

## 2021-07-23 NOTE — Progress Notes (Signed)
[facility] Progress Note  Subjective:  Stable SCr 5.7, K 3.5 1.6L UOP w/o diuretics 1L [place], VSS  Vitals:   07/22/21 0857 07/22/21 1117 07/22/21 1953 07/23/21 0353  BP: 137/78 (!) 163/84 (!) 164/87 (!) 123/55  Pulse: 67 65 69 66  Resp: $Remo'18 13 16 16  'zTESQ$ Temp:  98 F (36.7 C) 98.3 F (36.8 C) 98.3 F (36.8 C)  TempSrc:  Oral Oral Oral  SpO2: 97% 98% 97% 94%  Weight:    95.5 kg  Height:        Exam:  Gen alert, no distress No jvd or bruits Chest mostly clear, occ rales, diminished in the bases RRR no RG Abd soft ntnd no mass or ascites +bs Ext 2+ bilat LE edema throughout Neuro is alert, Ox 3 , nf       Home meds include - lipitor 40, prozac, neurontin, glipizide, humalog mix, latuda, toprol xl 50, ntg patch, ozempic, protonix, trental, mirapex, minipress, trazodone, prns/ vits/ supps        Date                       Creat                           eGFR      2019                       1.43                             40                                      Nov 2021                2.29 >> 1.76                25- 33, AKI episode      07/18/21                   4.69                             today      Na 135  K 4.7  CO2 25  BUN 41  Cr 4.69  Ca 8.4  Alb 2.7  LFT's okay    BNP 1020   Trop 84  WBC 8k  Hb 10.6       CXR  2/23 - IMPRESSION: Shallow inspiration. Patchy perihilar and basilar opacities may reflect edema or other atelectasis/consolidation. Similar cardiomegaly         Assessment/ Plan: AKI on CKD 3b vs Progressive CKD- b/l creat from Nov 2021 was creat 1.76, eGFR 33 ml/min.  Creat here is 4.69 on admission in setting probable decompensated CHF or perhaps just vol overload. Hasn't been seen at [place] with Dr. Justin Mend since 2020. Her kidneys have been "getting worse" over the last year or two per her PCP, #s not available.  Suspect this is progression of renal disease, possibly CKD V. Here w/ sig vol overload and pulm edema. No serious uremic symptoms. Diuretic  responsive.  Near euvolemia. Move to torsemide $RemoveBefo'40mg'KbPGCTPWMFX$  po qDay.  If stable tomorrow can consider  DC, will need close f/u at our office and I am already making arrangements.  HTN - Stable  Hyperkalemia - resolved. cont renal diet Volume overload - Improved IDDM Obesity   Rexene Agent, MD  07/23/2021, 12:28 PM   Recent Labs  Lab 07/18/21 1033 07/19/21 0541 07/20/21 0308 07/21/21 0255 07/22/21 0726 07/23/21 0302  K 4.7   < > 4.2   < > 3.6 3.5  BUN 41*   < > 45*   < > 46* 47*  CREATININE 4.69*   < > 5.01*   < > 5.71* 5.69*  ALBUMIN 2.7*  --   --   --   --   --   CALCIUM 8.4*   < > 8.3*   < > 8.3* 8.3*  HGB 10.6*  --  8.9*  --   --   --    < > = values in this interval not displayed.    Inpatient medications:  atorvastatin  40 mg Oral Daily   FLUoxetine  60 mg Oral q morning   gabapentin  100 mg Oral TID   heparin injection (subcutaneous)  5,000 Units Subcutaneous Q8H   insulin aspart  0-9 Units Subcutaneous TID WC   insulin aspart protamine- aspart  6 Units Subcutaneous BID WC   lurasidone  40 mg Oral QHS   metoprolol tartrate  25 mg Oral BID   pantoprazole  20 mg Oral Daily   torsemide  40 mg Oral Daily   traZODone  300 mg Oral QHS     acetaminophen **OR** acetaminophen, hydrOXYzine, nitroGLYCERIN, ondansetron **OR** ondansetron (ZOFRAN) IV

## 2021-07-24 LAB — BASIC METABOLIC PANEL
Anion gap: 11 (ref 5–15)
BUN: 50 mg/dL — ABNORMAL HIGH (ref 6–20)
CO2: 28 mmol/L (ref 22–32)
Calcium: 8.5 mg/dL — ABNORMAL LOW (ref 8.9–10.3)
Chloride: 98 mmol/L (ref 98–111)
Creatinine, Ser: 6 mg/dL — ABNORMAL HIGH (ref 0.44–1.00)
GFR, Estimated: 8 mL/min — ABNORMAL LOW (ref >60)
Glucose, Bld: 131 mg/dL — ABNORMAL HIGH (ref 70–99)
Potassium: 3.5 mmol/L (ref 3.5–5.1)
Sodium: 137 mmol/L (ref 135–145)

## 2021-07-24 LAB — GLUCOSE, CAPILLARY
Glucose-Capillary: 144 mg/dL — ABNORMAL HIGH (ref 70–99)
Glucose-Capillary: 157 mg/dL — ABNORMAL HIGH (ref 70–99)

## 2021-07-24 MED ORDER — TORSEMIDE 40 MG PO TABS: 40.0000 mg | ORAL_TABLET | Freq: Every day | ORAL | 0 refills | Status: DC

## 2021-07-24 NOTE — Progress Notes (Signed)
Occupational Therapy Treatment ?Patient Details ?Name: Jessica Mercado ?MRN: [id number] ?DOB: 1960/10/12 ?Today's Date: 07/24/2021 ? ? ?History of present illness 61 yo admitted 2/23 with dyspnea with AKI. PMHx: DM, HTN, retinopathy, HLD, obesity ?  ?OT comments ? Pt progressing towards acute OT goals. Continuing to work on building activity tolerance for OOB ADLs. Began education on energy conservation strategies as pertains to ADLs. D/c plan remains appropriate.   ? ?Recommendations for follow up therapy are one component of a multi-disciplinary discharge planning process, led by the attending physician.  Recommendations may be updated based on patient status, additional functional criteria and insurance authorization. ?   ?Follow Up Recommendations ? Home health OT  ?  ?Assistance Recommended at Discharge Intermittent Supervision/Assistance  ?Patient can return home with the following ? A little help with walking and/or transfers;A little help with bathing/dressing/bathroom;Assist for transportation;Help with stairs or ramp for entrance;Assistance with cooking/housework ?  ?Equipment Recommendations ? None recommended by OT  ?  ?Recommendations for Other Services   ? ?  ?Precautions / Restrictions Precautions ?Precautions: Fall;Other (comment) ?Precaution Comments: watch sats, pt with leg length discrepancy and orthopedic shoes ?Required Braces or Orthoses: Other Brace ?Other Brace: shoe on R foot for leg length discrepancy ?Restrictions ?Weight Bearing Restrictions: No  ? ? ?  ? ?Mobility Bed Mobility ?  ?  ?  ?  ?  ?  ?  ?General bed mobility comments: pt OOB in chair upon arrival ?  ? ?Transfers ?Overall transfer level: Needs assistance ?Equipment used: Straight cane ?Transfers: Sit to/from Stand ?Sit to Stand: Supervision ?  ?  ?  ?  ?  ?General transfer comment: to/from recliner. pt requested to use cane and did well with that for basic sit<>stand transfer this session ?  ?  ?Balance Overall balance assessment:  Needs assistance ?  ?Sitting balance-Leahy Scale: Fair ?  ?  ?Standing balance support: Single extremity supported, Bilateral upper extremity supported ?Standing balance-Leahy Scale: Poor ?Standing balance comment: single UE support for short distance, needs BUE support for prolonged times in standing/walking ?  ?  ?  ?  ?  ?  ?  ?  ?  ?  ?  ?   ? ?ADL either performed or assessed with clinical judgement  ? ?ADL Overall ADL's : Needs assistance/impaired ?  ?  ?Grooming: Set up;Supervision/safety;Standing ?Grooming Details (indicate cue type and reason): stood for 1 grooming task ?  ?  ?  ?  ?  ?  ?  ?  ?  ?  ?  ?  ?  ?  ?Functional mobility during ADLs: Min guard;Cane (to/from bathroom) ?General ADL Comments: Pt walked short distance to bathroom and stood for 1 grooming task. discussed energy conservation strategies ?  ? ?Extremity/Trunk Assessment Upper Extremity Assessment ?Upper Extremity Assessment: Generalized weakness ?  ?Lower Extremity Assessment ?Lower Extremity Assessment: Defer to PT evaluation ?  ?  ?  ? ?Vision   ?Additional Comments: Receives shots in her eyes from her MD ?  ?Perception   ?  ?Praxis   ?  ? ?Cognition Arousal/Alertness: Awake/alert ?Behavior During Therapy: Flat affect ?Overall Cognitive Status: Within Functional Limits for tasks assessed ?  ?  ?  ?  ?  ?  ?  ?  ?  ?  ?  ?  ?  ?  ?  ?  ?General Comments: some verbal perseveration and slow processing ?  ?  ?   ?Exercises   ? ?  ?Shoulder  Instructions   ? ? ?  ?General Comments VSS on RA  ? ? ?Pertinent Vitals/ Pain       Pain Assessment ?Pain Assessment: No/denies pain ? ?Home Living   ?  ?  ?  ?  ?  ?  ?  ?  ?  ?  ?  ?  ?  ?  ?  ?  ?  ?  ? ?  ?Prior Functioning/Environment    ?  ?  ?  ?   ? ?Frequency ? Min 2X/week  ? ? ? ? ?  ?Progress Toward Goals ? ?OT Goals(current goals can now be found in the care plan section) ? Progress towards OT goals: Progressing toward goals ? ?Acute Rehab OT Goals ?Patient Stated Goal: to get this figured  out so I can get to [place] in September ?OT Goal Formulation: With patient ?Time For Goal Achievement: 08/02/21 ?Potential to Achieve Goals: Good ?ADL Goals ?Pt Will Perform Lower Body Bathing: with adaptive equipment;with set-up ?Pt Will Perform Lower Body Dressing: with set-up;with adaptive equipment ?Pt Will Transfer to Toilet: ambulating;Independently ?Additional ADL Goal #1: Patient will demonstrate increased activity tolerance in order to complete functional task in standing for 5-7 minutes.  ?Plan Discharge plan remains appropriate   ? ?Co-evaluation ? ? ?   ?  ?  ?  ?  ? ?  ?AM-PAC OT "6 Clicks" Daily Activity     ?Outcome Measure ? ? Help from another person eating meals?: None ?Help from another person taking care of personal grooming?: A Little ?Help from another person toileting, which includes using toliet, bedpan, or urinal?: A Little ?Help from another person bathing (including washing, rinsing, drying)?: A Little ?Help from another person to put on and taking off regular upper body clothing?: A Little ?Help from another person to put on and taking off regular lower body clothing?: A Little ?6 Click Score: 19 ? ?  ?End of Session Equipment Utilized During Treatment: Other (comment) (cane) ? ?OT Visit Diagnosis: Other abnormalities of gait and mobility (R26.89);Unsteadiness on feet (R26.81);Muscle weakness (generalized) (M62.81) ?  ?Activity Tolerance Patient limited by fatigue ?  ?Patient Left in chair;with call bell/phone within reach;with chair alarm set ?  ?Nurse Communication   ?  ? ?   ? ?Time: 9892-1194 ?OT Time Calculation (min): 22 min ? ?Charges: OT General Charges ?$OT Visit: 1 Visit ?OT Treatments ?$Self Care/Home Management : 8-22 mins ? ?Tyrone Schimke, OT ?Acute Rehabilitation Services ?Office: [phone] ? ? ?Tyrone Schimke H ?07/24/2021, 10:46 AM ?

## 2021-07-24 NOTE — TOC Transition Note (Signed)
Transition of Care (TOC) - CM/SW Discharge Note ? ? ?Patient Details  ?Name: Jessica Mercado ?MRN: [id number] ?Date of Birth: Jan 30, 1961 ? ?Transition of Care (TOC) CM/SW Contact:  ?Zenon Mayo, RN ?Phone Number: ?07/24/2021, 1:17 PM ? ? ?Clinical Narrative:    ?Patient is for dc home today, she states she does not have any money for transportation, and she can not ride the bus due to her foot.  NCM assisting with cab voucher to home today. ? ? ?Final next level of care: [place] ?Barriers to Discharge: No Barriers Identified ? ? ?Patient Goals and CMS Choice ?Patient states their goals for this hospitalization and ongoing recovery are:: return home ?CMS Medicare.gov Compare Post Acute Care list provided to:: Patient ?Choice offered to / list presented to : Patient ? ?Discharge Placement ?  ?           ?  ?  ?  ?  ? ?Discharge Plan and Services ?  ?Discharge Planning Services: CM Consult ?Post Acute Care Choice: Home Health          ?  ?DME Agency: NA ?  ?  ?  ?HH Arranged: PT ?[place] Agency: [place] ?Date HH Agency Contacted: 08/17/21 ?Time [place]: 8144 ?Representative spoke with at [place]: Tommi Rumps ? ?Social Determinants of Health (SDOH) Interventions ?  ? ? ?Readmission Risk Interventions ?Readmission Risk Prevention Plan 07/20/2021  ?Transportation Screening Complete  ?Protection or Home Care Consult Complete  ?Social Work Consult for [place] Planning/Counseling Complete  ?Palliative Care Screening Not Applicable  ?Medication Review [employer] photographer) Complete  ?Some recent data might be hidden  ? ? ? ? ? ?

## 2021-07-24 NOTE — Discharge Summary (Signed)
Jessica Mercado WCB:[id number] DOB: [birth year] DOA: 07/18/2021  PCP: Vassie Moment, MD  Admit date: 07/18/2021 Discharge date: 07/24/2021  Admitted From: home Disposition:  home  Recommendations for Outpatient Follow-up:  Follow up with PCP in 1 week Please obtain BMP/CBC in one week Please follow up  with nephrology  Home Health:yes    Discharge Condition:Stable CODE STATUS:full  Diet recommendation: renal , carb modified   Brief/Interim Summary: Per HPI: Jessica Mercado is a 61 y.o. female with medical history significant of cataracts, diabetic and hypertensive retinopathy, type II DM, hyperlipidemia, hypertension, class II obesity who is coming to the emergency department with progressively worse dyspnea since yesterday.  She gets frequent lower extremity edema. ED course: Initial vital signs temperature 98.2 F, pulse 74, respiration 20, BP 170/102 mmHg O2 sat 89% on room air.  The patient received 40 mg of furosemide IVP.Lab work: CBC*white count 8.2, hemoglobin 10.6 g/dL platelets 292.  Troponin was 84 and then 10 ng/L.  BNP was 1020.2 pg/mL.  CMP showed a BUN of 41, creatinine 4.61 and calcium 8.4 mg/dL.  Albumin was 2.7 g/dL.   She was admitted to the hospital. Nephrology was consulted.   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  Baseline creatinine in November 2021 was 1.76.   -Renal ultrasound with no hydronephrosis, increased cortical echogenicity consistent with medical renal disease. -Nephrology was consulted.  Started on IV diuretics. -Suspect this is progression of renal disease, now CKD V. Here w/ sig vol overload and pulm edema which has improved with diuresis. No serious uremic symptoms.   Cleared by nephrology to be discharged home with close monitoring and arrangements have been made for follow-up. Continue torsemide 40 mg p.o. daily    Elevated troponin- (present on admission) - Likely due to demand  ischemia in the setting of volume overload and AKI No chest pain   Elevated d-dimer- (present on admission) - D-dimer 1.74 with asymmetric swelling of the bilateral lower extremities right greater than left -Venous Dopplers negative for DVT   Hypertensive urgency- (present on admission) Improving. Continue meds   Class 1 obesity- (present on admission) - Body mass index is 37.68 kg/m. 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) Active Problems:   Type 2 diabetes mellitus (HCC)   Anemia associated with chronic renal failure   Hyperlipidemia   Class 1 obesity   Hypertensive urgency   Elevated d-dimer   Elevated troponin    Discharge Instructions  Discharge Instructions     Call MD for:  difficulty breathing, headache or visual disturbances   Complete by: As directed    Diet - low sodium heart healthy   Complete by: As directed    Low potassium diet   Discharge instructions   Complete by: As directed    Important to f/u with your pcp and nephrologist in one week   Increase activity slowly   Complete by: As directed       Allergies as of 07/24/2021       Reactions   Vancomycin Other (See Comments)   Red man syndrome  Medication List     STOP taking these medications    nitroGLYCERIN 0.2 mg/hr patch Commonly known as: NITRODUR - Dosed in mg/24 hr   pentoxifylline 400 MG CR tablet Commonly known as: TRENTAL       TAKE these medications    atorvastatin 40 MG tablet Commonly known as: LIPITOR Take 40 mg by mouth daily. Notes to patient: Take tomorrow morning 07/25/21   BD Pen Needle Nano 2nd Gen 32G X 4 MM  Misc Generic drug: Insulin Pen Needle USE AS DIRECTED 2 TIMES DAILY   FLUoxetine 20 MG capsule Commonly known as: PROZAC Take 60 mg by mouth every morning. Notes to patient: Take tomorrow morning 07/25/21   gabapentin 800 MG tablet Commonly known as: NEURONTIN Take 800 mg by mouth 3 (three) times daily.   glipiZIDE 10 MG tablet Commonly known as: GLUCOTROL Take 10 mg by mouth 2 (two) times daily.   HumaLOG Mix 75/25 KwikPen (75-25) 100 UNIT/ML Kwikpen Generic drug: Insulin Lispro Prot & Lispro Inject 40 Units into the skin 2 (two) times daily.   hydrOXYzine 25 MG tablet Commonly known as: ATARAX Take 75 mg by mouth daily.   Latuda 40 MG Tabs tablet Generic drug: lurasidone Take 40 mg by mouth at bedtime.   metoprolol succinate 50 MG 24 hr tablet Commonly known as: TOPROL-XL Take 50 mg by mouth daily.   Ozempic (0.25 or 0.5 MG/DOSE) 2 MG/1.5ML Sopn Generic drug: Semaglutide(0.25 or 0.5MG /DOS) Inject 0.25 mg into the skin once a week.   pantoprazole 20 MG tablet Commonly known as: PROTONIX Take 20 mg by mouth daily.   pramipexole 0.125 MG tablet Commonly known as: MIRAPEX Take 0.125 mg by mouth every evening.   prazosin 1 MG capsule Commonly known as: MINIPRESS Take 1 mg by mouth every evening.   silver sulfADIAZINE 1 % cream Commonly known as: SILVADENE Apply 1 application topically daily.   Torsemide 40 MG Tabs Take 40 mg by mouth daily. Start taking on: July 25, 2021   traZODone 100 MG tablet Commonly known as: DESYREL Take 300 mg by mouth at bedtime.   Vitamin D (Ergocalciferol) 1.25 MG (50000 UNIT) Caps capsule Commonly known as: DRISDOL Take 50,000 Units by mouth every Friday.        Follow-up Information     [facility] Follow up.   Specialty: Home Health Services Why: [facility] will contact you with apt times Contact information: [place] [address] [phone]         Edrick Oh, MD  Follow up in 2 week(s).   Specialty: Nephrology Why: We will call with appointment details Contact information: [place] [address] [phone]         Vassie Moment, MD Follow up in 1 week(s).   Specialty: Family Medicine Contact information: [place] [address] [phone]                Allergies  Allergen Reactions   Vancomycin Other (See Comments)    Red man syndrome    Consultations: nephrology  Procedures/Studies: US RENAL  Result Date: 07/18/2021 CLINICAL DATA:  Renal dysfunction EXAM: RENAL / URINARY TRACT ULTRASOUND COMPLETE COMPARISON:  03/31/2020 FINDINGS: Right Kidney: Renal measurements: 7.9 x 5 x 4.5 cm = volume: 91.96 mL. There is increased cortical echogenicity. There is no hydronephrosis. Left Kidney: Renal measurements: 12.1 x 4.8 x 4.2 cm = volume: 127.10 mL. There is increased cortical echogenicity. There is  no hydronephrosis. Bladder: Appears normal for degree of bladder distention. Other: None. IMPRESSION: There is no hydronephrosis. There is increased cortical echogenicity in both kidneys suggesting medical renal disease. Electronically Signed   By: Elmer Picker M.D.   On: 07/18/2021 20:02   DG Chest Port 1 View  Result Date: 07/18/2021 CLINICAL DATA:  Shortness of breath EXAM: PORTABLE CHEST 1 VIEW COMPARISON:  03/29/2020 FINDINGS: Shallow inspiration with low lung volumes. Patchy perihilar and basilar opacities. Possible small pleural effusions. Similar cardiomegaly. IMPRESSION: Shallow inspiration. Patchy perihilar and basilar opacities may reflect edema or other atelectasis/consolidation. Similar cardiomegaly. Electronically Signed   By: Macy Mis M.D.   On: 07/18/2021 11:53   ECHOCARDIOGRAM COMPLETE  Result Date: 07/19/2021    ECHOCARDIOGRAM REPORT   Patient Name:   Jessica Mercado Date of Exam: 07/19/2021 Medical Rec #:  [id number]      Height:       65.0 in Accession #:    [id number]     Weight:        229.5 lb Date of Birth:  [birth year]      BSA:          2.097 m Patient Age:    61 years       BP:           122/64 mmHg Patient Gender: F              HR:           69 bpm. Exam Location:  Inpatient Procedure: 2D Echo, Cardiac Doppler and Color Doppler Indications:    CHF  History:        Patient has no prior history of Echocardiogram examinations.                 Risk Factors:Hypertension and Diabetes.  Sonographer:    Jyl Heinz Referring Phys: [id number] DAVID MANUEL Victoria Vera  1. Left ventricular ejection fraction, by estimation, is 45 to 50%. The left ventricle has mildly decreased function. The left ventricle demonstrates global hypokinesis. Left ventricular diastolic parameters are consistent with Grade II diastolic dysfunction (pseudonormalization).  2. Right ventricular systolic function is normal. The right ventricular size is normal.  3. A small pericardial effusion is present. The pericardial effusion is circumferential. There is no evidence of cardiac tamponade. Moderate pleural effusion in the left lateral region.  4. The mitral valve is normal in structure. Mild mitral valve regurgitation. No evidence of mitral stenosis.  5. The aortic valve is normal in structure. There is mild calcification of the aortic valve. There is mild thickening of the aortic valve. Aortic valve regurgitation is not visualized. Aortic valve sclerosis is present, with no evidence of aortic valve stenosis. Aortic valve Vmax measures 2.06 m/s.  6. The inferior vena cava is normal in size with greater than 50% respiratory variability, suggesting right atrial pressure of 3 mmHg. FINDINGS  Left Ventricle: Left ventricular ejection fraction, by estimation, is 45 to 50%. The left ventricle has mildly decreased function. The left ventricle demonstrates global hypokinesis. The left ventricular internal cavity size was normal in size. There is  no left ventricular hypertrophy. Left ventricular diastolic parameters are  consistent with Grade II diastolic dysfunction (pseudonormalization). Right Ventricle: The right ventricular size is normal. No increase in right ventricular wall thickness. Right ventricular systolic function is normal. Left Atrium: Left atrial size was normal in size. Right Atrium: Right atrial size was normal in size. Pericardium: A small pericardial effusion is present. The pericardial  effusion is circumferential. There is no evidence of cardiac tamponade. Mitral Valve: The mitral valve is normal in structure. Mild mitral valve regurgitation. No evidence of mitral valve stenosis. Tricuspid Valve: The tricuspid valve is normal in structure. Tricuspid valve regurgitation is not demonstrated. No evidence of tricuspid stenosis. Aortic Valve: The aortic valve is normal in structure. There is mild calcification of the aortic valve. There is mild thickening of the aortic valve. Aortic valve regurgitation is not visualized. Aortic valve sclerosis is present, with no evidence of aortic valve stenosis. Aortic valve mean gradient measures 11.0 mmHg. Aortic valve peak gradient measures 16.9 mmHg. Aortic valve area, by VTI measures 1.26 cm. Pulmonic Valve: The pulmonic valve was normal in structure. Pulmonic valve regurgitation is trivial. No evidence of pulmonic stenosis. Aorta: The aortic root is normal in size and structure. Venous: The inferior vena cava is normal in size with greater than 50% respiratory variability, suggesting right atrial pressure of 3 mmHg. IAS/Shunts: No atrial level shunt detected by color flow Doppler. Additional Comments: There is a moderate pleural effusion in the left lateral region.  LEFT VENTRICLE PLAX 2D LVIDd:         4.60 cm      Diastology LVIDs:         3.20 cm      LV e' medial:    3.59 cm/s LV PW:         1.10 cm      LV E/e' medial:  26.5 LV IVS:        1.10 cm      LV e' lateral:   4.56 cm/s LVOT diam:     2.00 cm      LV E/e' lateral: 20.9 LV SV:         68 LV SV Index:   32 LVOT  Area:     3.14 cm  LV Volumes (MOD) LV vol d, MOD A2C: 115.0 ml LV vol d, MOD A4C: 112.0 ml LV vol s, MOD A2C: 60.0 ml LV vol s, MOD A4C: 63.7 ml LV SV MOD A2C:     55.0 ml LV SV MOD A4C:     112.0 ml LV SV MOD BP:      52.5 ml RIGHT VENTRICLE             IVC RV Basal diam:  2.90 cm     IVC diam: 2.00 cm RV Mid diam:    2.10 cm RV S prime:     11.60 cm/s TAPSE (M-mode): 2.4 cm LEFT ATRIUM             Index        RIGHT ATRIUM           Index LA diam:        4.00 cm 1.91 cm/m   RA Area:     15.70 cm LA Vol (A2C):   58.1 ml 27.70 ml/m  RA Volume:   39.30 ml  18.74 ml/m LA Vol (A4C):   50.2 ml 23.94 ml/m LA Biplane Vol: 54.9 ml 26.18 ml/m  AORTIC VALVE AV Area (Vmax):    1.33 cm AV Area (Vmean):   1.28 cm AV Area (VTI):     1.26 cm AV Vmax:           205.67 cm/s AV Vmean:          162.333 cm/s AV VTI:            0.538 m AV Peak Grad:  16.9 mmHg AV Mean Grad:      11.0 mmHg LVOT Vmax:         87.10 cm/s LVOT Vmean:        65.950 cm/s LVOT VTI:          0.216 m LVOT/AV VTI ratio: 0.40  AORTA Ao Root diam: 2.90 cm Ao Asc diam:  3.30 cm MITRAL VALVE MV Area (PHT): 4.44 cm    SHUNTS MV Decel Time: 171 msec    Systemic VTI:  0.22 m MV E velocity: 95.20 cm/s  Systemic Diam: 2.00 cm MV A velocity: 81.00 cm/s MV E/A ratio:  1.18 Candee Furbish MD Electronically signed by Candee Furbish MD Signature Date/Time: 07/19/2021/4:02:36 PM    Final    VAS [place] LOWER EXTREMITY VENOUS (DVT)  Result Date: 07/20/2021  Lower Venous DVT Study Patient Name:  Jessica Mercado  Date of Exam:   07/20/2021 Medical Rec #: [id number]       Accession #:    [id number] Date of Birth: [birth year]       Patient Gender: F Patient Age:   60 years Exam Location:  [facility] Procedure:      VAS [place] LOWER EXTREMITY VENOUS (DVT) Referring Phys: ERIC [place] --------------------------------------------------------------------------------  Indications: Edema, and elevated D-dimer (1.74).  Limitations: Body habitus and poor ultrasound/tissue  interface. Comparison Study: Previous exam (LLEV) on 04/01/2019 Performing Technologist: Rogelia Rohrer RVT, RDMS  Examination Guidelines: A complete evaluation includes B-mode imaging, spectral Doppler, color Doppler, and power Doppler as needed of all accessible portions of each vessel. Bilateral testing is considered an integral part of a complete examination. Limited examinations for reoccurring indications may be performed as noted. The reflux portion of the exam is performed with the patient in reverse Trendelenburg.  +---------+---------------+---------+-----------+----------+--------------+  RIGHT     Compressibility Phasicity Spontaneity Properties Thrombus Aging  +---------+---------------+---------+-----------+----------+--------------+  CFV       Full            Yes       Yes                                    +---------+---------------+---------+-----------+----------+--------------+  SFJ       Full                                                             +---------+---------------+---------+-----------+----------+--------------+  FV Prox   Full            Yes       Yes                                    +---------+---------------+---------+-----------+----------+--------------+  FV Mid    Full            Yes       Yes                                    +---------+---------------+---------+-----------+----------+--------------+  FV Distal Full            Yes       Yes                                    +---------+---------------+---------+-----------+----------+--------------+  PFV       Full                                                             +---------+---------------+---------+-----------+----------+--------------+  POP       Full            Yes       Yes                                    +---------+---------------+---------+-----------+----------+--------------+  PTV       Full                                                              +---------+---------------+---------+-----------+----------+--------------+  PERO      Full                                                             +---------+---------------+---------+-----------+----------+--------------+   +---------+---------------+---------+-----------+----------+--------------+  LEFT      Compressibility Phasicity Spontaneity Properties Thrombus Aging  +---------+---------------+---------+-----------+----------+--------------+  CFV       Full            Yes       Yes                                    +---------+---------------+---------+-----------+----------+--------------+  SFJ       Full                                                             +---------+---------------+---------+-----------+----------+--------------+  FV Prox   Full            Yes       Yes                                    +---------+---------------+---------+-----------+----------+--------------+  FV Mid    Full            Yes       Yes                                    +---------+---------------+---------+-----------+----------+--------------+  FV Distal Full            Yes       Yes                                    +---------+---------------+---------+-----------+----------+--------------+  PFV       Full                                                             +---------+---------------+---------+-----------+----------+--------------+  POP       Full            Yes       Yes                                    +---------+---------------+---------+-----------+----------+--------------+  PTV       Full                                                             +---------+---------------+---------+-----------+----------+--------------+  PERO      Full                                                             +---------+---------------+---------+-----------+----------+--------------+     Summary: BILATERAL: - No evidence of deep vein thrombosis seen in the lower extremities, bilaterally. - No evidence of  superficial venous thrombosis in the lower extremities, bilaterally. -No evidence of popliteal cyst, bilaterally.   *See table(s) above for measurements and observations. Electronically signed by Monica Martinez MD on 07/20/2021 at 12:53:53 PM.    Final       Subjective: No sob or cp. No complaints this am  Discharge Exam: Vitals:   07/24/21 0848 07/24/21 1055  BP: (!) 145/63 (!) 158/92  Pulse: 70 63  Resp: 18 17  Temp: 98.1 F (36.7 C) 97.6 F (36.4 C)  SpO2: 95% 97%   Vitals:   07/23/21 2108 07/24/21 0329 07/24/21 0848 07/24/21 1055  BP: (!) 150/72 (!) 134/58 (!) 145/63 (!) 158/92  Pulse: 64 62 70 63  Resp: 17 14 18 17   Temp: 98.3 F (36.8 C) 97.8 F (36.6 C) 98.1 F (36.7 C) 97.6 F (36.4 C)  TempSrc: Oral Oral Oral Oral  SpO2: 97% 94% 95% 97%  Weight:  95.3 kg    Height:        General: Pt is alert, awake, not in acute distress Cardiovascular: RRR, S1/S2 +, no rubs, no gallops Respiratory: CTA bilaterally, no wheezing, no rhonchi Abdominal: Soft, NT, ND, bowel sounds + Extremities: mild LE edema    The results of significant diagnostics from this hospitalization (including imaging, microbiology, ancillary and laboratory) are listed below for reference.     Microbiology: Recent Results (from the past 240 hour(s))  Resp Panel by RT-PCR (Flu A&B, Covid) Nasopharyngeal Swab     Status: None   Collection Time: 07/18/21 11:24 AM   Specimen: Nasopharyngeal Swab; Nasopharyngeal(NP) swabs in vial transport medium  Result Value Ref Range Status   SARS Coronavirus 2 by RT PCR NEGATIVE NEGATIVE Final    Comment: (NOTE) SARS-CoV-2 target nucleic acids are NOT DETECTED.  The SARS-CoV-2 RNA is generally detectable in upper respiratory specimens during the acute phase of infection. The lowest concentration of SARS-CoV-2 viral copies this assay can detect is 138 copies/mL. A negative result does not preclude SARS-Cov-2 infection and should not be used as the sole basis  for treatment or other patient management decisions. A negative result may occur with  improper specimen collection/handling, submission of specimen other than nasopharyngeal swab, presence of viral mutation(s) within the areas targeted by this assay, and inadequate number of viral copies(<138 copies/mL). A negative result must be combined with clinical observations, patient history, and epidemiological information. The expected result is Negative.  Fact Sheet for Patients:  EntrepreneurPulse.com.au  Fact Sheet for Healthcare Providers:  IncredibleEmployment.be  This test is no t yet approved or cleared by the [place] FDA and  has been authorized for detection and/or diagnosis of SARS-CoV-2 by FDA under an Emergency Use Authorization (EUA). This EUA will remain  in effect (meaning this test can be used) for the duration of the COVID-19 declaration under Section 564(b)(1) of the Act, 21 U.S.C.section 360bbb-3(b)(1), unless the authorization is terminated  or revoked sooner.       Influenza A by PCR NEGATIVE NEGATIVE Final   Influenza B by PCR NEGATIVE NEGATIVE Final    Comment: (NOTE) The Xpert Xpress SARS-CoV-2/FLU/RSV plus assay is intended as an aid in the diagnosis of influenza from Nasopharyngeal swab specimens and should not be used as a sole basis for treatment. Nasal washings and aspirates are unacceptable for Xpert Xpress SARS-CoV-2/FLU/RSV testing.  Fact Sheet for Patients: EntrepreneurPulse.com.au  Fact Sheet for Healthcare Providers: IncredibleEmployment.be  This test is not yet approved or cleared by the [place] FDA and has been authorized for detection and/or diagnosis of SARS-CoV-2 by FDA under an Emergency Use Authorization (EUA). This EUA will remain in effect (meaning this test can be used) for the duration of the COVID-19 declaration under Section 564(b)(1) of the Act, 21  U.S.C. section 360bbb-3(b)(1), unless the authorization is terminated or revoked.  Performed at [facility], [place] [address] [place], [place] [address]      Labs: BNP (last 3 results) Recent Labs    07/18/21 1033  BNP 4,008.6*   Basic Metabolic Panel: Recent Labs  Lab 07/20/21 0308 07/21/21 0255 07/22/21 0726 07/23/21 0302 07/24/21 0343  NA 137 136 136 136 137  K 4.2 3.6 3.6 3.5 3.5  CL 100 99 97* 96* 98  CO2 27 28 31 29 28   GLUCOSE 178* 136* 175* 121* 131*  BUN 45* 46* 46* 47* 50*  CREATININE 5.01* 5.11* 5.71* 5.69* 6.00*  CALCIUM 8.3* 8.0* 8.3* 8.3* 8.5*   Liver Function Tests: Recent Labs  Lab 07/18/21 1033  AST 27  ALT 28  ALKPHOS 65  BILITOT 0.4  PROT 6.6  ALBUMIN 2.7*   No results for input(s): LIPASE, AMYLASE in the last 168 hours. No results for input(s): AMMONIA in the last 168 hours. CBC: Recent Labs  Lab 07/18/21 1033 07/20/21 0308  WBC 8.2 8.3  NEUTROABS 5.4  --   HGB 10.6* 8.9*  HCT 32.9* 28.0*  MCV 90.1 90.6  PLT 292 237   Cardiac Enzymes: No results for input(s): CKTOTAL, CKMB, CKMBINDEX, TROPONINI in the last 168 hours. BNP: Invalid input(s): POCBNP CBG: Recent Labs  Lab 07/23/21 1059 07/23/21 1619 07/23/21 2106 07/24/21 0620 07/24/21 1128  GLUCAP 202* 160* 160* 144* 157*   D-Dimer No results for input(s): DDIMER in the last 72 hours. Hgb  A1c No results for input(s): HGBA1C in the last 72 hours. Lipid Profile No results for input(s): CHOL, HDL, LDLCALC, TRIG, CHOLHDL, LDLDIRECT in the last 72 hours. Thyroid function studies No results for input(s): TSH, T4TOTAL, T3FREE, THYROIDAB in the last 72 hours.  Invalid input(s): FREET3 Anemia work up No results for input(s): VITAMINB12, FOLATE, FERRITIN, TIBC, IRON, RETICCTPCT in the last 72 hours. Urinalysis    Component Value Date/Time   COLORURINE YELLOW 07/19/2021 0736   APPEARANCEUR CLOUDY (A) 07/19/2021 0736   LABSPEC 1.008 07/19/2021 0736    PHURINE 6.0 07/19/2021 0736   GLUCOSEU NEGATIVE 07/19/2021 0736   HGBUR NEGATIVE 07/19/2021 0736   BILIRUBINUR NEGATIVE 07/19/2021 0736   KETONESUR NEGATIVE 07/19/2021 0736   PROTEINUR >=300 (A) 07/19/2021 0736   NITRITE NEGATIVE 07/19/2021 0736   LEUKOCYTESUR MODERATE (A) 07/19/2021 0736   Sepsis Labs Invalid input(s): PROCALCITONIN,  WBC,  LACTICIDVEN Microbiology Recent Results (from the past 240 hour(s))  Resp Panel by RT-PCR (Flu A&B, Covid) Nasopharyngeal Swab     Status: None   Collection Time: 07/18/21 11:24 AM   Specimen: Nasopharyngeal Swab; Nasopharyngeal(NP) swabs in vial transport medium  Result Value Ref Range Status   SARS Coronavirus 2 by RT PCR NEGATIVE NEGATIVE Final    Comment: (NOTE) SARS-CoV-2 target nucleic acids are NOT DETECTED.  The SARS-CoV-2 RNA is generally detectable in upper respiratory specimens during the acute phase of infection. The lowest concentration of SARS-CoV-2 viral copies this assay can detect is 138 copies/mL. A negative result does not preclude SARS-Cov-2 infection and should not be used as the sole basis for treatment or other patient management decisions. A negative result may occur with  improper specimen collection/handling, submission of specimen other than nasopharyngeal swab, presence of viral mutation(s) within the areas targeted by this assay, and inadequate number of viral copies(<138 copies/mL). A negative result must be combined with clinical observations, patient history, and epidemiological information. The expected result is Negative.  Fact Sheet for Patients:  EntrepreneurPulse.com.au  Fact Sheet for Healthcare Providers:  IncredibleEmployment.be  This test is no t yet approved or cleared by the [place] FDA and  has been authorized for detection and/or diagnosis of SARS-CoV-2 by FDA under an Emergency Use Authorization (EUA). This EUA will remain  in effect (meaning this  test can be used) for the duration of the COVID-19 declaration under Section 564(b)(1) of the Act, 21 U.S.C.section 360bbb-3(b)(1), unless the authorization is terminated  or revoked sooner.       Influenza A by PCR NEGATIVE NEGATIVE Final   Influenza B by PCR NEGATIVE NEGATIVE Final    Comment: (NOTE) The Xpert Xpress SARS-CoV-2/FLU/RSV plus assay is intended as an aid in the diagnosis of influenza from Nasopharyngeal swab specimens and should not be used as a sole basis for treatment. Nasal washings and aspirates are unacceptable for Xpert Xpress SARS-CoV-2/FLU/RSV testing.  Fact Sheet for Patients: EntrepreneurPulse.com.au  Fact Sheet for Healthcare Providers: IncredibleEmployment.be  This test is not yet approved or cleared by the [place] FDA and has been authorized for detection and/or diagnosis of SARS-CoV-2 by FDA under an Emergency Use Authorization (EUA). This EUA will remain in effect (meaning this test can be used) for the duration of the COVID-19 declaration under Section 564(b)(1) of the Act, 21 U.S.C. section 360bbb-3(b)(1), unless the authorization is terminated or revoked.  Performed at [facility], [place] [address] [place], [place] [address]      Time coordinating discharge: Over 30 minutes  SIGNED:  Nolberto Hanlon, MD  [facility] 07/24/2021, 1:06 PM Pager   If 7PM-7AM, please contact night-coverage [email or web address] Password [email or web address]

## 2021-07-24 NOTE — Progress Notes (Signed)
Physical Therapy Treatment ?Patient Details ?Name: Jessica Mercado ?MRN: [id number] ?DOB: April 26, 1961 ?Today's Date: 07/24/2021 ? ? ?History of Present Illness 61 yo admitted 2/23 with dyspnea with AKI. PMHx: DM, HTN, retinopathy, HLD, obesity ? ?  ?PT Comments  ? ? The pt was eager to mobilize this morning, able to demo good stability and activity tolerance this morning with reduction in O2 to RA. The pt maintained >95% on RA at rest and >93% on RA with gait. She did not report SOB with activity, but did take a single standing rest break after 125 ft. The pt also completed repeated sit-stands from recliner, but was limited due to fatigue and will continue to benefit from skilled PT to progress functional strength, stability, and endurance.  ? ?Gait Speed: 0.63m/s using rollator and with RA. (Gait speed <0.99m/s indicates increased risk of falls and dependence in ADLs) ? ?30 Second Sit-Stand: The patient completed 4 sit-stands in a 30 second period. ( < 14 for individuals aged < 18 indicates below average activity tolerance and increased risk of falls) ?   ?Recommendations for follow up therapy are one component of a multi-disciplinary discharge planning process, led by the attending physician.  Recommendations may be updated based on patient status, additional functional criteria and insurance authorization. ? ?Follow Up Recommendations ? Home health PT ?  ?  ?Assistance Recommended at Discharge Intermittent Supervision/Assistance  ?Patient can return home with the following A little help with bathing/dressing/bathroom;Assistance with cooking/housework;Direct supervision/assist for financial management;Direct supervision/assist for medications management ?  ?Equipment Recommendations ? None recommended by PT  ?  ?Recommendations for Other Services   ? ? ?  ?Precautions / Restrictions Precautions ?Precautions: Fall;Other (comment) ?Precaution Comments: watch sats, pt with leg length discrepancy and orthopedic  shoes ?Required Braces or Orthoses: Other Brace ?Other Brace: shoe on R foot for leg length discrepancy ?Restrictions ?Weight Bearing Restrictions: No  ?  ? ?Mobility ? Bed Mobility ?  ?  ?  ?  ?  ?  ?  ?General bed mobility comments: pt OOB in chair upon arrival ?  ? ?Transfers ?Overall transfer level: Needs assistance ?Equipment used: Rollator (4 wheels) ?Transfers: Sit to/from Stand ?Sit to Stand: Supervision ?  ?  ?  ?  ?  ?General transfer comment: no assist needed, pt able to rise without assist. completed 4 sit-stand in 30 sec ?  ? ?Ambulation/Gait ?Ambulation/Gait assistance: Supervision ?Gait Distance (Feet): 125 Feet (+ 50 ft after standing rest) ?Assistive device: Rollator (4 wheels) ?Gait Pattern/deviations: Step-through pattern, Decreased stride length ?Gait velocity: 0.45 m/s ?Gait velocity interpretation: 1.31 - 2.62 ft/sec, indicative of limited community ambulator ?  ?General Gait Details: pt with leg length discrepancy and uneven gait with reliance on RW,  SpO2 stable at 95%on RA without overt LOB ? ? ? ? ?  ?Balance Overall balance assessment: Needs assistance ?  ?Sitting balance-Leahy Scale: Fair ?Sitting balance - Comments: sitting at EOB ?  ?Standing balance support: Bilateral upper extremity supported ?Standing balance-Leahy Scale: Poor ?Standing balance comment: bil UE on rollator for gait ?  ?  ?  ?  ?  ?  ?  ?  ?  ?  ?  ?  ? ?  ?Cognition Arousal/Alertness: Awake/alert ?Behavior During Therapy: Flat affect ?Overall Cognitive Status: Within Functional Limits for tasks assessed ?  ?  ?  ?  ?  ?  ?  ?  ?  ?  ?  ?  ?  ?  ?  ?  ?  General Comments: pt able to answer all questions and make decision with good safety awareness ?  ?  ? ?  ?Exercises Other Exercises ?Other Exercises: repeated sit-stand from chair. x4 in 30 sec ? ?  ?General Comments General comments (skin integrity, edema, etc.): VSS on RA through session 95-96% at rest, 94-96% with gait ?  ?  ? ?Pertinent Vitals/Pain Pain  Assessment ?Pain Assessment: No/denies pain  ? ? ? ?PT Goals (current goals can now be found in the care plan section) Acute Rehab PT Goals ?Patient Stated Goal: return home to my son-in-law to move to [place] ?PT Goal Formulation: With patient ?Time For Goal Achievement: 08/02/21 ?Potential to Achieve Goals: Good ?Progress towards PT goals: Progressing toward goals ? ?  ?Frequency ? ? ? Min 3X/week ? ? ? ?  ?PT Plan Current plan remains appropriate  ? ? ?   ?AM-PAC PT "6 Clicks" Mobility   ?Outcome Measure ? Help needed turning from your back to your side while in a flat bed without using bedrails?: None ?Help needed moving from lying on your back to sitting on the side of a flat bed without using bedrails?: None ?Help needed moving to and from a bed to a chair (including a wheelchair)?: A Little ?Help needed standing up from a chair using your arms (e.g., wheelchair or bedside chair)?: A Little ?Help needed to walk in hospital room?: A Little ?Help needed climbing 3-5 steps with a railing? : A Lot ?6 Click Score: 19 ? ?  ?End of Session Equipment Utilized During Treatment: Gait belt;Oxygen ?Activity Tolerance: Patient tolerated treatment well ?Patient left: with call bell/phone within reach;in bed;with bed alarm set ?Nurse Communication: Mobility status ?PT Visit Diagnosis: Other abnormalities of gait and mobility (R26.89);Difficulty in walking, not elsewhere classified (R26.2);Muscle weakness (generalized) (M62.81) ?  ? ? ?Time: 3154-0086 ?PT Time Calculation (min) (ACUTE ONLY): 26 min ? ?Charges:  $Therapeutic Exercise: 23-37 mins          ?          ? ?West Carbo, PT, DPT  ? ?Acute Rehabilitation Department ?Pager #: [phone] ? ? ?[name] ?07/24/2021, 9:55 AM ? ?

## 2021-07-24 NOTE — Progress Notes (Signed)
[facility] ?Progress Note ? ?Subjective:  ?Weaned to RA ?Working with PT, in chair ?SCr 6.0 up a tad ?0.9L UOP reported yesterday ?Pt w/o c/o ? ?Vitals:  ? 07/23/21 1900 07/23/21 2108 07/24/21 0329 07/24/21 0848  ?BP: 131/79 (!) 150/72 (!) 134/58 (!) 145/63  ?Pulse: 64 64 62 70  ?Resp: $Remov'10 17 14 18  'lPJYkE$ ?Temp: 98.3 ?F (36.8 ?C) 98.3 ?F (36.8 ?C) 97.8 ?F (36.6 ?C) 98.1 ?F (36.7 ?C)  ?TempSrc: Oral Oral Oral Oral  ?SpO2: 94% 97% 94% 95%  ?Weight:   95.3 kg   ?Height:      ? ? ?Exam: ? Gen alert, no distress ?No jvd or bruits ?Chest mostly clear, occ rales, diminished in the bases ?RRR no RG ?Abd soft ntnd no mass or ascites +bs ?Ext no edema throughout ?Neuro is alert, Ox 3 , nf ?   ?  ? Home meds include - lipitor 40, prozac, neurontin, glipizide, humalog mix, latuda, toprol xl 50, ntg patch, ozempic, protonix, trental, mirapex, minipress, trazodone, prns/ vits/ supps ?  ?     Date                       Creat                           eGFR ?     2019                       1.43                             40                                 ?     Nov 2021                2.29 >> 1.76                25- 33, AKI episode ?     07/18/21                   4.69                             today ?  ?   Na 135  K 4.7  CO2 25  BUN 41  Cr 4.69  Ca 8.4  Alb 2.7  LFT's okay ?   BNP 1020   Trop 84  WBC 8k  Hb 10.6   ?    CXR  2/23 - IMPRESSION: Shallow inspiration. Patchy perihilar and basilar opacities may reflect edema or other atelectasis/consolidation. Similar cardiomegaly ?  ?  ?  ?  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  Creat here is 4.69 on admission in setting probable decompensated CHF or perhaps just vol overload. Hasn't been seen at [facility] with Dr. Justin Mend since 2020. Her kidneys have been "getting worse" over the last year or two per her PCP, #s not available.  Suspect this is progression of renal disease, now CKD V. Here w/ sig vol overload and  pulm edema which has improved with diuresis. No serious uremic symptoms.  Near euvolemia.  Cont orsemide $RemoveBefor'40mg'yRoNZzgUGXhk$  po qDay.  OK to DC and will follow closely at [facility], arrangements being made ?HTN - Stable  ?Hyperkalemia - resolved. cont renal diet ?Volume overload - Improved ?IDDM ?Obesity ? ? ?Rexene Agent, MD  ?07/24/2021, 10:42 AM ? ? ?Recent Labs  ?Lab 07/18/21 ?1033 07/19/21 ?9483 07/20/21 ?0308 07/21/21 ?0255 07/23/21 ?0302 07/24/21 ?4758  ?K 4.7   < > 4.2   < > 3.5 3.5  ?BUN 41*   < > 45*   < > 47* 50*  ?CREATININE 4.69*   < > 5.01*   < > 5.69* 6.00*  ?ALBUMIN 2.7*  --   --   --   --   --   ?CALCIUM 8.4*   < > 8.3*   < > 8.3* 8.5*  ?HGB 10.6*  --  8.9*  --   --   --   ? < > = values in this interval not displayed.  ? ? ?Inpatient medications: ? atorvastatin  40 mg Oral Daily  ? FLUoxetine  60 mg Oral q morning  ? gabapentin  100 mg Oral TID  ? heparin injection (subcutaneous)  5,000 Units Subcutaneous Q8H  ? insulin aspart  0-9 Units Subcutaneous TID WC  ? insulin aspart protamine- aspart  6 Units Subcutaneous BID WC  ? lurasidone  40 mg Oral QHS  ? metoprolol tartrate  25 mg Oral BID  ? pantoprazole  20 mg Oral Daily  ? torsemide  40 mg Oral Daily  ? traZODone  300 mg Oral QHS  ? ? ? ?acetaminophen **OR** acetaminophen, hydrOXYzine, nitroGLYCERIN, ondansetron **OR** ondansetron (ZOFRAN) IV ? ? ? ? ? ? ?

## 2021-07-24 NOTE — Progress Notes (Signed)
Pt A/O x 3/4. Pt at baseline. 1 - assist steady gait.RA  Admitted for AKI see note. PO torsemide given. Education provided. Denies pain and SOB. VSS. Pt adequate to d/c  ?

## 2021-07-25 ENCOUNTER — Encounter (INDEPENDENT_AMBULATORY_CARE_PROVIDER_SITE_OTHER): Payer: Medicaid Other | Admitting: Ophthalmology

## 2021-09-08 ENCOUNTER — Emergency Department (HOSPITAL_COMMUNITY)
Admission: EM | Admit: 2021-09-08 | Discharge: 2021-09-08 | Disposition: A | Discharge status: Home / Self Care | Payer: Medicaid Other | Attending: Emergency Medicine | Admitting: Emergency Medicine

## 2021-09-08 ENCOUNTER — Other Ambulatory Visit: Payer: Self-pay

## 2021-09-08 ENCOUNTER — Encounter (HOSPITAL_COMMUNITY): Payer: Self-pay

## 2021-09-08 ENCOUNTER — Emergency Department (HOSPITAL_COMMUNITY): Payer: Medicaid Other

## 2021-09-08 DIAGNOSIS — R600 Localized edema: Secondary | ICD-10-CM | POA: Insufficient documentation

## 2021-09-08 DIAGNOSIS — R112 Nausea with vomiting, unspecified: Secondary | ICD-10-CM | POA: Diagnosis not present

## 2021-09-08 DIAGNOSIS — R55 Syncope and collapse: Secondary | ICD-10-CM

## 2021-09-08 DIAGNOSIS — E1122 Type 2 diabetes mellitus with diabetic chronic kidney disease: Secondary | ICD-10-CM | POA: Diagnosis not present

## 2021-09-08 DIAGNOSIS — Z20822 Contact with and (suspected) exposure to covid-19: Secondary | ICD-10-CM | POA: Insufficient documentation

## 2021-09-08 DIAGNOSIS — E1165 Type 2 diabetes mellitus with hyperglycemia: Secondary | ICD-10-CM | POA: Diagnosis not present

## 2021-09-08 DIAGNOSIS — N186 End stage renal disease: Secondary | ICD-10-CM | POA: Diagnosis not present

## 2021-09-08 DIAGNOSIS — R109 Unspecified abdominal pain: Secondary | ICD-10-CM | POA: Diagnosis not present

## 2021-09-08 DIAGNOSIS — Z79899 Other long term (current) drug therapy: Secondary | ICD-10-CM | POA: Diagnosis not present

## 2021-09-08 DIAGNOSIS — Z794 Long term (current) use of insulin: Secondary | ICD-10-CM | POA: Insufficient documentation

## 2021-09-08 DIAGNOSIS — W01198A Fall on same level from slipping, tripping and stumbling with subsequent striking against other object, initial encounter: Secondary | ICD-10-CM | POA: Diagnosis not present

## 2021-09-08 DIAGNOSIS — I120 Hypertensive chronic kidney disease with stage 5 chronic kidney disease or end stage renal disease: Secondary | ICD-10-CM | POA: Insufficient documentation

## 2021-09-08 DIAGNOSIS — Z7984 Long term (current) use of oral hypoglycemic drugs: Secondary | ICD-10-CM | POA: Insufficient documentation

## 2021-09-08 DIAGNOSIS — R1114 Bilious vomiting: Secondary | ICD-10-CM

## 2021-09-08 DIAGNOSIS — Y92009 Unspecified place in unspecified non-institutional (private) residence as the place of occurrence of the external cause: Secondary | ICD-10-CM | POA: Insufficient documentation

## 2021-09-08 LAB — BASIC METABOLIC PANEL
Anion gap: 11 (ref 5–15)
BUN: 59 mg/dL — ABNORMAL HIGH (ref 6–20)
CO2: 26 mmol/L (ref 22–32)
Calcium: 9 mg/dL (ref 8.9–10.3)
Chloride: 99 mmol/L (ref 98–111)
Creatinine, Ser: 6.41 mg/dL — ABNORMAL HIGH (ref 0.44–1.00)
GFR, Estimated: 7 mL/min — ABNORMAL LOW (ref >60)
Glucose, Bld: 115 mg/dL — ABNORMAL HIGH (ref 70–99)
Potassium: 4 mmol/L (ref 3.5–5.1)
Sodium: 136 mmol/L (ref 135–145)

## 2021-09-08 LAB — CBC
HCT: 34.5 % — ABNORMAL LOW (ref 36.0–46.0)
Hemoglobin: 11.5 g/dL — ABNORMAL LOW (ref 12.0–15.0)
MCH: 29.4 pg (ref 26.0–34.0)
MCHC: 33.3 g/dL (ref 30.0–36.0)
MCV: 88.2 fL (ref 80.0–100.0)
Platelets: 255 10*3/uL (ref 150–400)
RBC: 3.91 MIL/uL (ref 3.87–5.11)
RDW: 13.2 % (ref 11.5–15.5)
WBC: 10.3 10*3/uL (ref 4.0–10.5)
nRBC: 0 % (ref 0.0–0.2)

## 2021-09-08 LAB — CBG MONITORING, ED: Glucose-Capillary: 123 mg/dL — ABNORMAL HIGH (ref 70–99)

## 2021-09-08 LAB — RESP PANEL BY RT-PCR (FLU A&B, COVID) ARPGX2
Influenza A by PCR: NEGATIVE
Influenza B by PCR: NEGATIVE
SARS Coronavirus 2 by RT PCR: NEGATIVE

## 2021-09-08 LAB — HEPATIC FUNCTION PANEL
ALT: 20 U/L (ref 0–44)
AST: 21 U/L (ref 15–41)
Albumin: 2.4 g/dL — ABNORMAL LOW (ref 3.5–5.0)
Alkaline Phosphatase: 58 U/L (ref 38–126)
Bilirubin, Direct: 0.1 mg/dL (ref 0.0–0.2)
Indirect Bilirubin: 0.6 mg/dL (ref 0.3–0.9)
Total Bilirubin: 0.7 mg/dL (ref 0.3–1.2)
Total Protein: 5.3 g/dL — ABNORMAL LOW (ref 6.5–8.1)

## 2021-09-08 LAB — ETHANOL: Alcohol, Ethyl (B): <10 mg/dL (ref <10)

## 2021-09-08 MED ORDER — SODIUM CHLORIDE 0.9 % IV BOLUS
500.0000 mL | Freq: Once | INTRAVENOUS | Status: AC
  Administered 2021-09-08: 500 mL via INTRAVENOUS

## 2021-09-08 NOTE — Discharge Instructions (Signed)
As discussed, your evaluation today has been largely reassuring.  But, it is important that you monitor your condition carefully, and do not hesitate to return to the ED if you develop new, or concerning changes in your condition. ? ?Otherwise, please follow-up with your physician for appropriate ongoing care. ? ?

## 2021-09-08 NOTE — ED Notes (Signed)
Called [facility] to transport patient home ?

## 2021-09-08 NOTE — ED Triage Notes (Signed)
Pt BIB [facility] from home c/o LOC. Fire initially said her BP was 80 palpated. Pt did hit her head and c/o of pain to the back of the head. Per EMS pt is weak and hot to touch. [name] BP with EMS was 133/88.  ?

## 2021-09-08 NOTE — ED Notes (Signed)
Pt refusing urine collection at this time. MD notified  ?

## 2021-09-08 NOTE — ED Notes (Signed)
Unable to reach family by phone. Called social work about getting pt a ride home, pt stated she can't go in a taxi alone because she walks with a walker and doesn't have her walker with her.  ?

## 2021-09-08 NOTE — ED Provider Notes (Signed)
?[facility] ?Provider Note ? ? ?CSN: [id number] ?Arrival date & time: 09/08/21  0645 ? ?  ? ?History ? ?Chief Complaint  ?Patient presents with  ? Loss of Consciousness  ? ? ?Jessica Mercado is a 61 y.o. female. ? ?HPI ?Patient with multiple medical issues presents from home with concern for nausea, vomiting, and episode of loss of consciousness.  She arrives from home via EMS, and history is obtained by those individuals, and the patient.  Patient has no current pain other than abdominal discomfort, notes that over the past week she has had no bowel movements, has had nausea, vomiting.  There is slight discrepancy with EMS report which notes patient did hit her head after a fall, secondary to loss of consciousness at home.  Patient notes a history of CKD as well as multiple other medical problems, she is trending toward dialysis, but has no fistula yet. ?  ? ?Home Medications ?Prior to Admission medications   ?Medication Sig Start Date End Date Taking? Authorizing Provider  ?atorvastatin (LIPITOR) 40 MG tablet Take 40 mg by mouth daily. 12/21/17   [provider]  ?BD PEN NEEDLE NANO 2ND GEN 32G X 4 MM MISC USE AS DIRECTED 2 TIMES DAILY 06/02/20   [provider]  ?FLUoxetine (PROZAC) 20 MG capsule Take 60 mg by mouth every morning. 08/09/20   [provider]  ?gabapentin (NEURONTIN) 800 MG tablet Take 800 mg by mouth 3 (three) times daily. 12/21/17   [provider]  ?glipiZIDE (GLUCOTROL) 10 MG tablet Take 10 mg by mouth 2 (two) times daily. 02/06/20   [provider]  ?HUMALOG MIX 75/25 KWIKPEN (75-25) 100 UNIT/ML Kwikpen Inject 40 Units into the skin 2 (two) times daily.  12/21/17   [provider]  ?hydrOXYzine (ATARAX/VISTARIL) 25 MG tablet Take 75 mg by mouth daily.  11/06/17   [provider]  ?LATUDA 40 MG TABS tablet Take 40 mg by mouth at bedtime. 03/27/20   [provider]  ?metoprolol succinate  (TOPROL-XL) 50 MG 24 hr tablet Take 50 mg by mouth daily. 12/21/17   [provider]  ?OZEMPIC 0.25 or 0.5 MG/DOSE SOPN Inject 0.25 mg into the skin once a week. 01/04/18   [provider]  ?pantoprazole (PROTONIX) 20 MG tablet Take 20 mg by mouth daily. 07/18/20   [provider]  ?pramipexole (MIRAPEX) 0.125 MG tablet Take 0.125 mg by mouth every evening. 12/21/17   [provider]  ?prazosin (MINIPRESS) 1 MG capsule Take 1 mg by mouth every evening. 12/22/17   [provider]  ?silver sulfADIAZINE (SILVADENE) 1 % cream Apply 1 application topically daily. 02/06/20   [provider]  ?torsemide 40 MG TABS Take 40 mg by mouth daily. 07/25/21 08/24/21  Nolberto Hanlon, MD  ?traZODone (DESYREL) 100 MG tablet Take 300 mg by mouth at bedtime.  12/21/17   [provider]  ?Vitamin D, Ergocalciferol, (DRISDOL) 50000 units CAPS capsule Take 50,000 Units by mouth every Friday. 11/06/17   [provider]  ?   ? ?Allergies    ?Vancomycin   ? ?Review of Systems   ?Review of Systems  ?Constitutional:   ?     Per HPI, otherwise negative  ?HENT:    ?     Per HPI, otherwise negative  ?Respiratory:    ?     Per HPI, otherwise negative  ?Cardiovascular:   ?     Per HPI, otherwise negative  ?  Gastrointestinal:  Negative for vomiting.  ?Endocrine:  ?     Negative aside from HPI  ?Genitourinary:   ?     Neg aside from HPI   ?Musculoskeletal:   ?     Per HPI, otherwise negative  ?Skin: Negative.   ?Neurological:  Positive for syncope.  ? ?Physical Exam ?Updated Vital Signs ?BP (!) 149/99   Pulse 82   Temp 99.6 ?F (37.6 ?C)   Resp 13   Ht 5\' 7"  (1.702 m)   Wt 90.7 kg   LMP  (LMP Unknown)   SpO2 95%   BMI 31.32 kg/m?  ?Physical Exam ?Vitals and nursing note reviewed.  ?Constitutional:   ?   General: She is not in acute distress. ?   Appearance: She is well-developed. She is obese. She is not ill-appearing or diaphoretic.  ?HENT:  ?   Head: Normocephalic and atraumatic.   ?Eyes:  ?   Conjunctiva/sclera: Conjunctivae normal.  ?Cardiovascular:  ?   Rate and Rhythm: Normal rate and regular rhythm.  ?Pulmonary:  ?   Effort: Pulmonary effort is normal. No respiratory distress.  ?   Breath sounds: Normal breath sounds. No stridor.  ?Abdominal:  ?   General: There is no distension.  ?Musculoskeletal:  ?   Right lower leg: Edema present.  ?   Left lower leg: Edema present.  ?Skin: ?   General: Skin is warm and dry.  ?Neurological:  ?   General: No focal deficit present.  ?   Mental Status: She is alert and oriented to person, place, and time.  ?   Cranial Nerves: No cranial nerve deficit.  ?   Motor: No tremor or abnormal muscle tone.  ?   Coordination: Coordination normal.  ?Psychiatric:     ?   Mood and Affect: Mood normal.  ? ? ?ED Results / Procedures / Treatments   ?Labs ?(all labs ordered are listed, but only abnormal results are displayed) ?Labs Reviewed  ?BASIC METABOLIC PANEL - Abnormal; Notable for the following components:  ?    Result Value  ? Glucose, Bld 115 (*)   ? BUN 59 (*)   ? Creatinine, Ser 6.41 (*)   ? GFR, Estimated 7 (*)   ? All other components within normal limits  ?CBC - Abnormal; Notable for the following components:  ? Hemoglobin 11.5 (*)   ? HCT 34.5 (*)   ? All other components within normal limits  ?HEPATIC FUNCTION PANEL - Abnormal; Notable for the following components:  ? Total Protein 5.3 (*)   ? Albumin 2.4 (*)   ? All other components within normal limits  ?CBG MONITORING, ED - Abnormal; Notable for the following components:  ? Glucose-Capillary 123 (*)   ? All other components within normal limits  ?RESP PANEL BY RT-PCR (FLU A&B, COVID) [id number]  ?ETHANOL  ?URINALYSIS, ROUTINE W REFLEX MICROSCOPIC  ?LACTIC ACID, PLASMA  ?LACTIC ACID, PLASMA  ? ? ?EKG ?EKG Interpretation ? ?Date/Time:  Sunday September 08 2021 07:01:30 EDT ?Ventricular Rate:  82 ?PR Interval:  164 ?QRS Duration: 80 ?QT Interval:  422 ?QTC Calculation: 493 ?R Axis:   -15 ?Text  Interpretation: Normal sinus rhythm Anterior infarct , age undetermined Abnormal ECG Confirmed by Carmin Muskrat [id number]) on 09/08/2021 11:00:53 AM ? ?Radiology ?CT Abdomen Pelvis Wo Contrast ? ?Result Date: 09/08/2021 ?CLINICAL DATA:  Evaluate for bowel obstruction. Nausea and vomiting. No bowel movement for 10 days. EXAM: CT ABDOMEN AND PELVIS WITHOUT CONTRAST TECHNIQUE: Multidetector CT  imaging of the abdomen and pelvis was performed following the standard protocol without IV contrast. RADIATION DOSE REDUCTION: This exam was performed according to the departmental dose-optimization program which includes automated exposure control, adjustment of the mA and/or kV according to patient size and/or use of iterative reconstruction technique. COMPARISON:  None. FINDINGS: Lower chest: Scar versus subsegmental atelectasis identified in the left lower lobe. No pleural effusion or airspace disease. There is mild cardiac enlargement. Small pericardial effusion is identified, image 6/3. Hepatobiliary: No focal liver abnormality is seen. No gallstones, gallbladder wall thickening, or biliary dilatation. Pancreas: Unremarkable. No pancreatic ductal dilatation or surrounding inflammatory changes. Spleen: Normal in size without focal abnormality. Adrenals/Urinary Tract: Normal adrenal glands. Mild asymmetric atrophy with cortical scarring is noted involving the right kidney. No nephrolithiasis, mass or hydronephrosis identified bilaterally. Mild perinephric fat stranding is noted about the left kidney. Gas identified within the urinary bladder. Stomach/Bowel: Stomach is normal. The appendix is visualized and appears normal. Moderate stool burden is identified throughout the colon and rectum. No signs of bowel wall thickening, inflammation, or abnormal distension. Vascular/Lymphatic: Aortic atherosclerosis. No abdominopelvic adenopathy. Reproductive: Uterus and bilateral adnexa are unremarkable. Other: No free fluid, fluid  collections or signs of pneumoperitoneum. Musculoskeletal: Schmorl's node deformities are identified involving the inferior endplate of L3 and L4 and superior endplate of L5. Moderate degenerative disc disease is noted at L5-S1. IMPRESSION: 1. No a

## 2021-09-16 ENCOUNTER — Emergency Department (HOSPITAL_COMMUNITY): Payer: Medicaid Other

## 2021-09-16 ENCOUNTER — Encounter (HOSPITAL_COMMUNITY): Payer: Self-pay

## 2021-09-16 ENCOUNTER — Other Ambulatory Visit: Payer: Self-pay

## 2021-09-16 ENCOUNTER — Inpatient Hospital Stay (HOSPITAL_COMMUNITY)
Admission: EM | Admit: 2021-09-16 | Discharge: 2021-09-25 | DRG: 492 | Disposition: A | Discharge status: Rehabilitation Facility | Payer: Medicaid Other | Attending: Family Medicine | Admitting: Family Medicine

## 2021-09-16 DIAGNOSIS — G4700 Insomnia, unspecified: Secondary | ICD-10-CM | POA: Diagnosis present

## 2021-09-16 DIAGNOSIS — F319 Bipolar disorder, unspecified: Secondary | ICD-10-CM | POA: Diagnosis present

## 2021-09-16 DIAGNOSIS — R296 Repeated falls: Secondary | ICD-10-CM | POA: Diagnosis present

## 2021-09-16 DIAGNOSIS — N189 Chronic kidney disease, unspecified: Secondary | ICD-10-CM

## 2021-09-16 DIAGNOSIS — N179 Acute kidney failure, unspecified: Secondary | ICD-10-CM | POA: Diagnosis present

## 2021-09-16 DIAGNOSIS — Y712 Prosthetic and other implants, materials and accessory cardiovascular devices associated with adverse incidents: Secondary | ICD-10-CM | POA: Diagnosis not present

## 2021-09-16 DIAGNOSIS — Z66 Do not resuscitate: Secondary | ICD-10-CM | POA: Diagnosis present

## 2021-09-16 DIAGNOSIS — T8241XA Breakdown (mechanical) of vascular dialysis catheter, initial encounter: Secondary | ICD-10-CM | POA: Diagnosis not present

## 2021-09-16 DIAGNOSIS — G2581 Restless legs syndrome: Secondary | ICD-10-CM | POA: Diagnosis present

## 2021-09-16 DIAGNOSIS — R55 Syncope and collapse: Secondary | ICD-10-CM | POA: Diagnosis not present

## 2021-09-16 DIAGNOSIS — S82852A Displaced trimalleolar fracture of left lower leg, initial encounter for closed fracture: Principal | ICD-10-CM

## 2021-09-16 DIAGNOSIS — E118 Type 2 diabetes mellitus with unspecified complications: Secondary | ICD-10-CM

## 2021-09-16 DIAGNOSIS — N19 Unspecified kidney failure: Secondary | ICD-10-CM

## 2021-09-16 DIAGNOSIS — W19XXXA Unspecified fall, initial encounter: Secondary | ICD-10-CM

## 2021-09-16 DIAGNOSIS — R2689 Other abnormalities of gait and mobility: Secondary | ICD-10-CM | POA: Diagnosis present

## 2021-09-16 DIAGNOSIS — I5040 Unspecified combined systolic (congestive) and diastolic (congestive) heart failure: Secondary | ICD-10-CM | POA: Diagnosis present

## 2021-09-16 DIAGNOSIS — M67872 Other specified disorders of synovium, left ankle and foot: Secondary | ICD-10-CM | POA: Diagnosis present

## 2021-09-16 DIAGNOSIS — I132 Hypertensive heart and chronic kidney disease with heart failure and with stage 5 chronic kidney disease, or end stage renal disease: Secondary | ICD-10-CM | POA: Diagnosis present

## 2021-09-16 DIAGNOSIS — D631 Anemia in chronic kidney disease: Secondary | ICD-10-CM | POA: Diagnosis present

## 2021-09-16 DIAGNOSIS — N390 Urinary tract infection, site not specified: Secondary | ICD-10-CM | POA: Diagnosis present

## 2021-09-16 DIAGNOSIS — G4733 Obstructive sleep apnea (adult) (pediatric): Secondary | ICD-10-CM | POA: Insufficient documentation

## 2021-09-16 DIAGNOSIS — R2681 Unsteadiness on feet: Secondary | ICD-10-CM | POA: Diagnosis present

## 2021-09-16 DIAGNOSIS — H35033 Hypertensive retinopathy, bilateral: Secondary | ICD-10-CM | POA: Diagnosis present

## 2021-09-16 DIAGNOSIS — I129 Hypertensive chronic kidney disease with stage 1 through stage 4 chronic kidney disease, or unspecified chronic kidney disease: Secondary | ICD-10-CM

## 2021-09-16 DIAGNOSIS — B9620 Unspecified Escherichia coli [E. coli] as the cause of diseases classified elsewhere: Secondary | ICD-10-CM | POA: Diagnosis present

## 2021-09-16 DIAGNOSIS — E7800 Pure hypercholesterolemia, unspecified: Secondary | ICD-10-CM | POA: Diagnosis present

## 2021-09-16 DIAGNOSIS — G9349 Other encephalopathy: Secondary | ICD-10-CM | POA: Diagnosis present

## 2021-09-16 DIAGNOSIS — N186 End stage renal disease: Secondary | ICD-10-CM | POA: Diagnosis present

## 2021-09-16 DIAGNOSIS — Z6831 Body mass index (BMI) 31.0-31.9, adult: Secondary | ICD-10-CM

## 2021-09-16 DIAGNOSIS — W010XXA Fall on same level from slipping, tripping and stumbling without subsequent striking against object, initial encounter: Secondary | ICD-10-CM | POA: Diagnosis present

## 2021-09-16 DIAGNOSIS — M898X9 Other specified disorders of bone, unspecified site: Secondary | ICD-10-CM | POA: Diagnosis present

## 2021-09-16 DIAGNOSIS — E669 Obesity, unspecified: Secondary | ICD-10-CM | POA: Diagnosis present

## 2021-09-16 DIAGNOSIS — Z881 Allergy status to other antibiotic agents status: Secondary | ICD-10-CM

## 2021-09-16 DIAGNOSIS — E1122 Type 2 diabetes mellitus with diabetic chronic kidney disease: Secondary | ICD-10-CM | POA: Diagnosis present

## 2021-09-16 DIAGNOSIS — Z794 Long term (current) use of insulin: Secondary | ICD-10-CM

## 2021-09-16 DIAGNOSIS — Y92013 Bedroom of single-family (private) house as the place of occurrence of the external cause: Secondary | ICD-10-CM

## 2021-09-16 DIAGNOSIS — I5042 Chronic combined systolic (congestive) and diastolic (congestive) heart failure: Secondary | ICD-10-CM | POA: Diagnosis present

## 2021-09-16 DIAGNOSIS — Z820 Family history of epilepsy and other diseases of the nervous system: Secondary | ICD-10-CM

## 2021-09-16 DIAGNOSIS — Z79899 Other long term (current) drug therapy: Secondary | ICD-10-CM

## 2021-09-16 DIAGNOSIS — K589 Irritable bowel syndrome without diarrhea: Secondary | ICD-10-CM | POA: Insufficient documentation

## 2021-09-16 DIAGNOSIS — Z833 Family history of diabetes mellitus: Secondary | ICD-10-CM

## 2021-09-16 DIAGNOSIS — Z7984 Long term (current) use of oral hypoglycemic drugs: Secondary | ICD-10-CM

## 2021-09-16 DIAGNOSIS — E11311 Type 2 diabetes mellitus with unspecified diabetic retinopathy with macular edema: Secondary | ICD-10-CM | POA: Diagnosis present

## 2021-09-16 DIAGNOSIS — M19072 Primary osteoarthritis, left ankle and foot: Secondary | ICD-10-CM | POA: Diagnosis present

## 2021-09-16 HISTORY — DX: Displaced trimalleolar fracture of left lower leg, initial encounter for closed fracture: S82.852A

## 2021-09-16 HISTORY — DX: Unspecified disorder of synovium and tendon, left shoulder: M67.912

## 2021-09-16 LAB — BASIC METABOLIC PANEL
Anion gap: 16 — ABNORMAL HIGH (ref 5–15)
BUN: 86 mg/dL — ABNORMAL HIGH (ref 6–20)
CO2: 24 mmol/L (ref 22–32)
Calcium: 8.5 mg/dL — ABNORMAL LOW (ref 8.9–10.3)
Chloride: 96 mmol/L — ABNORMAL LOW (ref 98–111)
Creatinine, Ser: 8.06 mg/dL — ABNORMAL HIGH (ref 0.44–1.00)
GFR, Estimated: 5 mL/min — ABNORMAL LOW (ref >60)
Glucose, Bld: 182 mg/dL — ABNORMAL HIGH (ref 70–99)
Potassium: 4.3 mmol/L (ref 3.5–5.1)
Sodium: 136 mmol/L (ref 135–145)

## 2021-09-16 LAB — HEPATIC FUNCTION PANEL
ALT: 22 U/L (ref 0–44)
AST: 23 U/L (ref 15–41)
Albumin: 2.8 g/dL — ABNORMAL LOW (ref 3.5–5.0)
Alkaline Phosphatase: 75 U/L (ref 38–126)
Bilirubin, Direct: <0.1 mg/dL (ref 0.0–0.2)
Total Bilirubin: 1.2 mg/dL (ref 0.3–1.2)
Total Protein: 6.4 g/dL — ABNORMAL LOW (ref 6.5–8.1)

## 2021-09-16 LAB — PHOSPHORUS: Phosphorus: 5 mg/dL — ABNORMAL HIGH (ref 2.5–4.6)

## 2021-09-16 LAB — URINALYSIS, MICROSCOPIC (REFLEX)

## 2021-09-16 LAB — URINALYSIS, ROUTINE W REFLEX MICROSCOPIC
Bilirubin Urine: NEGATIVE
Glucose, UA: NEGATIVE mg/dL
Ketones, ur: NEGATIVE mg/dL
Nitrite: NEGATIVE
Protein, ur: >300 mg/dL — AB
Specific Gravity, Urine: 1.02 (ref 1.005–1.030)
pH: 7.5 (ref 5.0–8.0)

## 2021-09-16 LAB — HEMOGLOBIN A1C
Hgb A1c MFr Bld: 5.7 % — ABNORMAL HIGH (ref 4.8–5.6)
Mean Plasma Glucose: 116.89 mg/dL

## 2021-09-16 LAB — CBC WITH DIFFERENTIAL/PLATELET
Abs Immature Granulocytes: 0.07 10*3/uL (ref 0.00–0.07)
Basophils Absolute: 0.1 10*3/uL (ref 0.0–0.1)
Basophils Relative: 0 %
Eosinophils Absolute: 0.1 10*3/uL (ref 0.0–0.5)
Eosinophils Relative: 1 %
HCT: 31.8 % — ABNORMAL LOW (ref 36.0–46.0)
Hemoglobin: 10.4 g/dL — ABNORMAL LOW (ref 12.0–15.0)
Immature Granulocytes: 0 %
Lymphocytes Relative: 10 %
Lymphs Abs: 1.6 10*3/uL (ref 0.7–4.0)
MCH: 29.4 pg (ref 26.0–34.0)
MCHC: 32.7 g/dL (ref 30.0–36.0)
MCV: 89.8 fL (ref 80.0–100.0)
Monocytes Absolute: 0.6 10*3/uL (ref 0.1–1.0)
Monocytes Relative: 4 %
Neutro Abs: 14 10*3/uL — ABNORMAL HIGH (ref 1.7–7.7)
Neutrophils Relative %: 85 %
Platelets: 277 10*3/uL (ref 150–400)
RBC: 3.54 MIL/uL — ABNORMAL LOW (ref 3.87–5.11)
RDW: 12.9 % (ref 11.5–15.5)
WBC: 16.4 10*3/uL — ABNORMAL HIGH (ref 4.0–10.5)
nRBC: 0 % (ref 0.0–0.2)

## 2021-09-16 LAB — TSH: TSH: 2.65 u[IU]/mL (ref 0.350–4.500)

## 2021-09-16 LAB — CBG MONITORING, ED: Glucose-Capillary: 151 mg/dL — ABNORMAL HIGH (ref 70–99)

## 2021-09-16 LAB — PROTIME-INR
INR: 1.1 (ref 0.8–1.2)
Prothrombin Time: 14.4 seconds (ref 11.4–15.2)

## 2021-09-16 LAB — MAGNESIUM: Magnesium: 2.5 mg/dL — ABNORMAL HIGH (ref 1.7–2.4)

## 2021-09-16 LAB — AMMONIA: Ammonia: 15 umol/L (ref 9–35)

## 2021-09-16 MED ORDER — SODIUM CHLORIDE 0.9 % IV SOLN: 100.0000 mL | INTRAVENOUS | Status: DC | PRN

## 2021-09-16 MED ORDER — ADULT MULTIVITAMIN W/MINERALS CH
1.0000 | ORAL_TABLET | Freq: Every day | ORAL | Status: DC
  Administered 2021-09-16 – 2021-09-18 (×2): 1 via ORAL
  Filled 2021-09-16 (×3): qty 1

## 2021-09-16 MED ORDER — BENZTROPINE MESYLATE 1 MG PO TABS
1.0000 mg | ORAL_TABLET | Freq: Every evening | ORAL | Status: DC
  Administered 2021-09-16 – 2021-09-24 (×9): 1 mg via ORAL
  Filled 2021-09-16 (×10): qty 1

## 2021-09-16 MED ORDER — LURASIDONE HCL 20 MG PO TABS
40.0000 mg | ORAL_TABLET | Freq: Every day | ORAL | Status: DC
  Administered 2021-09-16 – 2021-09-24 (×9): 40 mg via ORAL
  Filled 2021-09-16 (×3): qty 2
  Filled 2021-09-16: qty 1
  Filled 2021-09-16 (×4): qty 2
  Filled 2021-09-16: qty 1
  Filled 2021-09-16 (×4): qty 2

## 2021-09-16 MED ORDER — ATORVASTATIN CALCIUM 40 MG PO TABS
40.0000 mg | ORAL_TABLET | Freq: Every day | ORAL | Status: DC
  Administered 2021-09-16 – 2021-09-25 (×9): 40 mg via ORAL
  Filled 2021-09-16 (×9): qty 1

## 2021-09-16 MED ORDER — HYDROMORPHONE HCL 1 MG/ML IJ SOLN: 0.5000 mg | Freq: Once | INTRAMUSCULAR | Status: DC

## 2021-09-16 MED ORDER — HEPARIN SODIUM (PORCINE) 5000 UNIT/ML IJ SOLN
5000.0000 [IU] | Freq: Three times a day (TID) | INTRAMUSCULAR | Status: DC
  Administered 2021-09-16: 5000 [IU] via SUBCUTANEOUS
  Filled 2021-09-16: qty 1

## 2021-09-16 MED ORDER — INSULIN ASPART 100 UNIT/ML IJ SOLN
0.0000 [IU] | Freq: Three times a day (TID) | INTRAMUSCULAR | Status: DC
  Administered 2021-09-16 – 2021-09-19 (×3): 1 [IU] via SUBCUTANEOUS
  Administered 2021-09-19: 2 [IU] via SUBCUTANEOUS
  Administered 2021-09-20 – 2021-09-24 (×7): 1 [IU] via SUBCUTANEOUS
  Administered 2021-09-24: 2 [IU] via SUBCUTANEOUS
  Administered 2021-09-25 (×2): 1 [IU] via SUBCUTANEOUS

## 2021-09-16 MED ORDER — METOPROLOL SUCCINATE ER 50 MG PO TB24
50.0000 mg | ORAL_TABLET | Freq: Every day | ORAL | Status: DC
  Administered 2021-09-16 – 2021-09-25 (×9): 50 mg via ORAL
  Filled 2021-09-16 (×3): qty 1
  Filled 2021-09-16: qty 2
  Filled 2021-09-16 (×5): qty 1

## 2021-09-16 MED ORDER — FLUOXETINE HCL 20 MG PO CAPS
60.0000 mg | ORAL_CAPSULE | Freq: Every morning | ORAL | Status: DC
  Administered 2021-09-16 – 2021-09-25 (×10): 60 mg via ORAL
  Filled 2021-09-16 (×11): qty 3

## 2021-09-16 MED ORDER — ACETAMINOPHEN 650 MG RE SUPP: 650.0000 mg | Freq: Four times a day (QID) | RECTAL | Status: DC | PRN

## 2021-09-16 MED ORDER — GABAPENTIN 400 MG PO CAPS
400.0000 mg | ORAL_CAPSULE | Freq: Two times a day (BID) | ORAL | Status: DC
  Administered 2021-09-16 – 2021-09-17 (×3): 400 mg via ORAL
  Filled 2021-09-16 (×5): qty 1

## 2021-09-16 MED ORDER — PRAZOSIN HCL 1 MG PO CAPS
1.0000 mg | ORAL_CAPSULE | Freq: Every evening | ORAL | Status: DC
Start: 2021-09-16 — End: 2021-09-25
  Administered 2021-09-17 – 2021-09-24 (×8): 1 mg via ORAL
  Filled 2021-09-16 (×10): qty 1

## 2021-09-16 MED ORDER — CHLORHEXIDINE GLUCONATE CLOTH 2 % EX PADS
6.0000 | MEDICATED_PAD | Freq: Every day | CUTANEOUS | Status: DC
  Administered 2021-09-17 – 2021-09-25 (×9): 6 via TOPICAL

## 2021-09-16 MED ORDER — ACETAMINOPHEN 325 MG PO TABS
650.0000 mg | ORAL_TABLET | Freq: Four times a day (QID) | ORAL | Status: DC | PRN
  Administered 2021-09-16: 650 mg via ORAL
  Filled 2021-09-16: qty 2

## 2021-09-16 MED ORDER — TRAZODONE HCL 150 MG PO TABS
300.0000 mg | ORAL_TABLET | Freq: Every day | ORAL | Status: DC
  Administered 2021-09-16 – 2021-09-24 (×9): 300 mg via ORAL
  Filled 2021-09-16: qty 3
  Filled 2021-09-16 (×2): qty 6
  Filled 2021-09-16: qty 3
  Filled 2021-09-16: qty 2
  Filled 2021-09-16: qty 3
  Filled 2021-09-16: qty 2
  Filled 2021-09-16 (×4): qty 3

## 2021-09-16 MED ORDER — FENTANYL CITRATE PF 50 MCG/ML IJ SOSY
50.0000 ug | PREFILLED_SYRINGE | Freq: Once | INTRAMUSCULAR | Status: AC
  Administered 2021-09-16: 50 ug via INTRAVENOUS
  Filled 2021-09-16: qty 1

## 2021-09-16 NOTE — Progress Notes (Signed)
RNCM received response from [facility] currently able to accept patient.  ?RN notified this RNCM patient being admitted inpatient.  ? ?Floor TOC team member will follow during inpt stay.   ?

## 2021-09-16 NOTE — ED Notes (Signed)
Patient urinated on self, peri care and brief applied..  Repositioned in bed.  ?

## 2021-09-16 NOTE — Consult Note (Addendum)
[facility] [place] ?CONSULT NOTE ? ? ?MRN : [id number] ? ?Reason for Consult: AKI on CKD V ?Referring Physician: Joelyn Oms ? ?History of Present Illness: 61 y/o female with history of CKD stage V, now ESRD. We have been asked to provide HD access and TDC placement.  She is right hand dominant.  She denies chest wall implants. ?Past medical history includes:  left ankle fracture requiring ORIF 09/16/21. ?DM, Hypercholesterolemia, HTN and CKD.   ?  ?  ? ? ?Current Facility-Administered Medications  ?Medication Dose Route Frequency Provider Last Rate Last Admin  ? atorvastatin (LIPITOR) tablet 40 mg  40 mg Oral Daily Eppie Gibson, MD   40 mg at 09/16/21 1404  ? benztropine (COGENTIN) tablet 1 mg  1 mg Oral QPM Eppie Gibson, MD      ? Derrill Memo ON 09/17/2021] Chlorhexidine Gluconate Cloth 2 % PADS 6 each  6 each Topical Q0600 Rexene Agent, MD      ? FLUoxetine (PROZAC) capsule 60 mg  60 mg Oral q morning Eppie Gibson, MD   60 mg at 09/16/21 1409  ? gabapentin (NEURONTIN) capsule 400 mg  400 mg Oral BID Eppie Gibson, MD   400 mg at 09/16/21 1409  ? heparin injection 5,000 Units  5,000 Units Subcutaneous Q8H Eppie Gibson, MD   5,000 Units at 09/16/21 1404  ? insulin aspart (novoLOG) injection 0-6 Units  0-6 Units Subcutaneous TID WC Eppie Gibson, MD      ? lurasidone (LATUDA) tablet 40 mg  40 mg Oral QHS Eppie Gibson, MD      ? metoprolol succinate (TOPROL-XL) 24 hr tablet 50 mg  50 mg Oral Daily Eppie Gibson, MD   50 mg at 09/16/21 1404  ? multivitamin with minerals tablet 1 tablet  1 tablet Oral Daily Eppie Gibson, MD   1 tablet at 09/16/21 1404  ? prazosin (MINIPRESS) capsule 1 mg  1 mg Oral QPM Eppie Gibson, MD      ? traZODone (DESYREL) tablet 300 mg  300 mg Oral QHS Eppie Gibson, MD      ? ?Current Outpatient Medications  ?Medication Sig Dispense Refill  ? atorvastatin (LIPITOR) 40 MG tablet Take 40 mg by mouth daily.  3  ? benztropine (COGENTIN) 1 MG  tablet Take 1 mg by mouth every evening.    ? CALCIUM PO Take 1 capsule by mouth daily.    ? FLUoxetine (PROZAC) 20 MG capsule Take 60 mg by mouth every morning.    ? gabapentin (NEURONTIN) 800 MG tablet Take 400 mg by mouth 2 (two) times daily.  3  ? glipiZIDE (GLUCOTROL) 10 MG tablet Take 5 mg by mouth 2 (two) times daily.    ? HUMALOG MIX 75/25 KWIKPEN (75-25) 100 UNIT/ML Kwikpen Inject 20 Units into the skin 2 (two) times daily as needed (High Blood Sugar).  3  ? LATUDA 40 MG TABS tablet Take 40 mg by mouth at bedtime.    ? metoprolol succinate (TOPROL-XL) 50 MG 24 hr tablet Take 50 mg by mouth daily.  3  ? Multiple Vitamin (MULTIVITAMIN WITH MINERALS) TABS tablet Take 1 tablet by mouth daily.    ? prazosin (MINIPRESS) 1 MG capsule Take 1 mg by mouth every evening.  3  ? Semaglutide, 1 MG/DOSE, 4 MG/3ML SOPN Inject 1.3 mg into the skin every Monday.    ? torsemide (DEMADEX) 20 MG tablet Take 40 mg by mouth  daily.    ? traZODone (DESYREL) 100 MG tablet Take 300 mg by mouth at bedtime.   3  ? Vitamin D, Ergocalciferol, (DRISDOL) 50000 units CAPS capsule Take 50,000 Units by mouth every Friday.  0  ? hydrOXYzine (ATARAX/VISTARIL) 25 MG tablet Take 75 mg by mouth daily.  (Patient not taking: Reported on 09/16/2021)  0  ? ? ?Pt meds include: ?Statin :Yes ?Betablocker: Yes ?ASA: No ?Other anticoagulants/antiplatelets: None ? ?Past Medical History:  ?Diagnosis Date  ? Cataract   ? Mixed OU  ? Diabetes mellitus without complication ([place])   ? Diabetic retinopathy ([place])   ? NPDR OU  ? Hyperlipemia   ? Hyperlipidemia 07/18/2021  ? Hypertension   ? Hypertensive retinopathy   ? OU  ? ? ?Past Surgical History:  ?Procedure Laterality Date  ? ANKLE ARTHROSCOPY WITH OPEN REDUCTION INTERNAL FIXATION (ORIF)    ? ? ?Social History ?Social History  ? ?Tobacco Use  ? Smoking status: Never  ? Smokeless tobacco: Never  ?Vaping Use  ? Vaping Use: Never used  ?Substance Use Topics  ? Alcohol use: Not Currently  ? Drug use: Not Currently   ? ? ?Family History ?Family History  ?Problem Relation Age of Onset  ? Diabetes Mother   ? Diabetes Father   ? Diabetes Sister   ? ? ?Allergies  ?Allergen Reactions  ? Vancomycin Other (See Comments)  ?  Red man syndrome  ? ? ? ?REVIEW OF SYSTEMS ? ?General: [ ]  Weight loss, [ ]  Fever, [ ]  chills ?Neurologic: [ ]  Dizziness, [ ]  Blackouts, [ ]  Seizure ?[ ]  Stroke, [ ]  "Mini stroke", [ ]  Slurred speech, [ ]  Temporary blindness; [ ]  weakness in arms or legs, [ ]  Hoarseness [ ]  Dysphagia ?Cardiac: [ ]  Chest pain/pressure, [ ]  Shortness of breath at rest [ ]  Shortness of breath with exertion, [ ]  Atrial fibrillation or irregular heartbeat  ?Vascular: [ ]  Pain in legs with walking, [ ]  Pain in legs at rest, [ ]  Pain in legs at night, ? [ ]  Non-healing ulcer, [ ]  Blood clot in vein/DVT,   ?Pulmonary: [ ]  Home oxygen, [ ]  Productive cough, [ ]  Coughing up blood, [ ]  Asthma, ? [ ]  Wheezing [ ]  COPD ?Musculoskeletal:  [ ]  Arthritis, [ ]  Low back pain, [x ] Joint pain ?Hematologic: [ ]  Easy Bruising, [ ]  Anemia; [ ]  Hepatitis ?Gastrointestinal: [ ]  Blood in stool, [ ]  Gastroesophageal Reflux/heartburn, ?Urinary: [x ] chronic Kidney disease, [ ]  on HD - [ ]  MWF or [ ]  TTHS, [ ]  Burning with urination, [ ]  Difficulty urinating ?Skin: [ ]  Rashes, [ ]  Wounds ?Psychological: [ ]  Anxiety, [ ]  Depression ? ?Physical Examination ?Vitals:  ? 09/16/21 1215 09/16/21 1300 09/16/21 1330 09/16/21 1400  ?BP: (!) 176/64 (!) 150/99 (!) 158/91 (!) 147/90  ?Pulse: 77 75 77 76  ?Resp: 16 12 15 18   ?Temp:      ?SpO2: 98% 99% 98% 98%  ?Weight:      ?Height:      ? ?Body mass index is 31.08 kg/m?. ? ?General:  WDWN in NAD ?Gait: Normal ?HENT: WNL ?Eyes: Pupils equal ?Pulmonary: normal non-labored breathing , without Rales, rhonchi,  wheezing ?Cardiac: RRR, without  Murmurs, rubs or gallops; ?No carotid bruits ?Abdomen: soft, NT, no masses ?Skin: no rashes, ulcers noted;  no Gangrene , no cellulitis; no open wounds;  ? ?Vascular Exam/Pulses:palpable  radial, brachial pulses B ? ? ?Musculoskeletal: no muscle wasting  or atrophy; no edema  ?Neurologic: A&O X 3; Appropriate Affect ;  ?SENSATION: normal; ?MOTOR FUNCTION: grossly intact, splint on left ankle ?Speech is fluent/normal ? ? ?Significant Diagnostic Studies: ?CBC ?Lab Results  ?Component Value Date  ? WBC 16.4 (H) 09/16/2021  ? HGB 10.4 (L) 09/16/2021  ? HCT 31.8 (L) 09/16/2021  ? MCV 89.8 09/16/2021  ? PLT 277 09/16/2021  ? ? ?BMET ?   ?Component Value Date/Time  ? NA 136 09/16/2021 0928  ? K 4.3 09/16/2021 0928  ? CL 96 (L) 09/16/2021 2707  ? CO2 24 09/16/2021 0928  ? GLUCOSE 182 (H) 09/16/2021 8675  ? BUN 86 (H) 09/16/2021 4492  ? CREATININE 8.06 (H) 09/16/2021 0100  ? CALCIUM 8.5 (L) 09/16/2021 7121  ? GFRNONAA 5 (L) 09/16/2021 9758  ? GFRAA 47 (L) 01/07/2018 1551  ? ?Estimated Creatinine Clearance: 8.7 mL/min (A) (by C-G formula based on SCr of 8.06 mg/dL (H)). ? ?COAG ?Lab Results  ?Component Value Date  ? INR 1.1 09/16/2021  ? ? ? ?Non-Invasive Vascular Imaging:  ?Pending vein mapping ? ?ASSESSMENT/PLAN:  ?AKI on CKD stage V now requiring HD access and TDC. ?She is right hand dominant and vein mapping is pending. ?We will plan left AV fistula verse graft.  Scheduling will be discussed with Dr. Virl Cagey.  ?Plan is for ORIF left ankle tomorrow with Dr. Marcelino Scot. ? ?Roxy Horseman ?09/16/2021 ?2:56 PM ? ?VASCULAR STAFF ADDENDUM: ?I have independently interviewed and examined the patient. ?I agree with the above.  ?Plan for [facility] immediately following ORIF while still under general anesthesia.  ?After discussing the risks and benefits, Jahnavi elected to proceed.  ? ?J. Melene Muller, MD ?[facility] ?Office Phone Number: [phone] ?09/16/2021 9:17 PM ? ? ? ?
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
## 2021-09-16 NOTE — ED Triage Notes (Signed)
Pt had fall on 4/23 and was helped back into bed by som. This morning pt son noticed deformity. Rotation to left foot. Pt denies pain or discomfort ?

## 2021-09-16 NOTE — H&P (Signed)
Family Medicine Teaching Service ?Hospital Admission History and Physical ?Service Pager: [phone] ? ?Patient name: Jessica Mercado Medical record number: [id number] ?Date of birth: 1961/04/23 Age: 61 y.o. Gender: female ? ?Primary Care Provider: Vassie Moment, MD ?Consultants: Nephrology, Orthopedic Surgery ?Code Status: DNR ?Preferred Emergency Contact: [name], Son-in-Law and primary caregiver [phone] (Mobile) ? ?Chief Complaint: Fall, ankle pain ? ?Assessment and Plan: ?Jessica Mercado is a 61 y.o. female presenting with left ankle pain after a fall at home, found to have trimalleolar fracture. Here for surgical fixation and syncope/pre-syncope workup. PMH is significant for CKD V not on HD, DM2, Bipolar Disorder, HFmrEF, HTN, and anemia.  ? ?Fall  Syncopal Episodes ?Trimalleolar Fracture ?Patient presented initially for a trimalleolar fracture that occurred in the setting of a fall related to a syncopal episode. All of this in the setting of increased confusion, fatigue, and multiple recent falls. Vital signs on admission have been within normal limits. Initial labwork was significant for marked uremia, with BUN up to 86 from 59 just a week ago. Cr is 8.06 from 6.41. Also with a leukocytosis to 16.4, mild anemia to 10.4, and an anion gap to 16. XR and CT ankle confirm displaced trimalleolar fractures which have been reduced and splinted. She was evaluated by orthopedic surgery and deemed a candidate for surgical repair.  ?More concerning than her acute fracture, however, is her repeated falls and increasing confusion and fatigue. Patient was seen in the ED just last week after a fall and had a CT head which negative for acute intracranial abnormality. The most likely cause of her symptoms is uremia given her rapidly progressive renal disease. Could also consider infectious etiology, with UTI highest on the differential given suprapubic tenderness, prior CT findings, and leukocytosis. Other items on the  differential include hepatic encephalopathy given her jaundiced appearance, but LFTs one week ago were normal.  ?- Admit to med-tele, Dr. McDiarmid attending ?- Orthopedic surgery following, possible surgery tomorrow ?- NPO at midnight ?- EKG ?- Cardiac monitors ?- Addressing renal failure as discussed below ?- Will check ammonia, Hepatic panel, PT/INR ?- TSH ?- Will work up suprapubic pain as discussed below ?- Pain control per orthopedic surgery, favor synthetic opioids given renal function ?- No echo at this time as patient just had one two months ago (after symptoms began) ? ?Acute on Chronic Renal Failure ?Cr 8.06 and BUN 87, up from 6.41 and 59 just one week ago. Has a history of CKD V but not on HD. Per chart review, this has been rapidly progressive. Cr was 4.69 on 2/23. Nephrology was consulted by EDP and recommended admission for likely HD. She has multiple risk factors for CKD, including DM2 and HTN, but her diabetes has been reasonably well-controlled and this does not explain the relatively rapid progression of her disease. CT on 4/16 noted mild asymmetric atrophy with cortical scarring involving the right kidney and mild soft-tissue stranding about the left kidney, neither of which offer an explanation for her symptoms.  ?- Consult to nephrology, if her symptoms are thought to be secondary to uremia, she is likely to need dialysis ?- Will add mag and phos to labs ?- RFP in am ?- Avoid nephrotoxic agents ? ?Suprapubic Pain ?Patient with suprapubic pain and leukocytosis. CT Abd/Pelv on 4/16 showed gas within the urinary bladder and mild soft tissue stranding about the left kidney. Consider that cystitis/pyeolonephritis could be contributing to her confusion.  ?- UA ?- Urine culture ?- If patient unable  to spontaneously void, will in-and-out cath ? ?Diabetes  ?Last A1c 6.6, 2 month ago, though per patient and family report, it sounds like she has had tight control at home since then without need for  insulin.  ?- recheck A1c ?- CBGs, vsSSI ? ?Hypertension ?BP on arrival 111/72, has progressively risen to 150s-170s/90s. Home regimen includes prazosin 1mg  QHS and metoprolol XL 50mg  daily.  ?- Continue home metoprolol and prazosin ? ?HFmrEF ?Last echo 2/24 with LVEF 45-50%, grade II diastolic dysfunction, mild mitral and aortic regurgitation. No evidence of fluid overload on exam. In fact appears fluid down with tacky mucous membranes.  ?- Continue metoprolol as above ?- Hold torsemide, restart as needed ?- Caution with fluids; will allow to orally rehydrate ad lib ? ?Anemia ?Hgb 10.4, which seems to be around her recent baseline. Presumed secondary to CKD. No evidence or report of active bleed.  ?- Continue to monitor ?- Anemia panel in am ? ?Bipolar Disorder ?Parkinsonism vs EPS  ?On Latuda and Prozac. Patient with family history of Parkinson's disease. On Benztropine for Parkinsonism which is managed by her psychiatrist.  ?- Continue home Latuda and Prozac ?- Continue home benztropine 1mg  QHS ? ?Hyperlipidemia ?- Continue home atorvastatin ? ?Insomnia ?- Continue home Trazodone  ? ?FEN/GI: Renal diet, no fluid restriction for now ?Prophylaxis: Heparin SQ ? ?Disposition: Med-Tele ? ?History of Present Illness:  Jessica Mercado is a 61 y.o. female presenting with a an ankle fracture in the setting of a fall at home.  All of this in the setting of increased confusion, generalized weakness for several months. ?History obtained from patient and from preferred emergency contact, son-in-law [name]. He reports "Weak episodes" for 2-3 months.  Over that time period, she has had numerous falls. Never lost consciousness. Also feels weak and has tremors while resting.  He also notes that she has been increasingly confused over the same time.  And will lose track of a thought in the middle of a sentence and has forgotten names and phone numbers of people who are important to her.  She has been told by her psychiatrist  that this might be Parkinson's disease or a side effect of her antipsychotics.  Her mother did have Parkinson's disease. ?She uses a walker or cane to get around the house for the past 5 years after a right ankle injury limited her mobility.  She lives with her son-in-law who is her primary caretaker and preferred medical decision-maker. ?Over the past 4.5-5 weeks she and her son-in-law have noticed that her blood sugars have been running lower than usual.  They have been generally between 100-150.  The lowest that they have seen at home is 89. She has not taken humalog at all in that time period. Did not take last dose of Ozempic (takes on Monday). ? ?Review Of Systems: Per HPI with the following additions:  ? ?Review of Systems  ?Constitutional:  Positive for activity change and appetite change.  ?Respiratory:  Negative for shortness of breath.   ?Cardiovascular:  Negative for chest pain and leg swelling.  ?Genitourinary:  Positive for decreased urine volume and pelvic pain. Negative for dysuria.  ?Musculoskeletal:  Positive for joint swelling and myalgias (L ankle).  ?Neurological:  Positive for weakness. Negative for dizziness, syncope, light-headedness and headaches.   ? ?Patient Active Problem List  ? Diagnosis Date Noted  ? 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 Hypertensive urgency 07/20/2021  ? Elevated d-dimer 07/20/2021  ?  Elevated troponin 07/20/2021  ? Acute renal failure (ARF) ([place]) 07/19/2021  ? Acute dyspnea 07/18/2021  ? Type 2 diabetes mellitus ([place]) 07/18/2021  ? Anemia associated with chronic renal failure 07/18/2021  ? Hyperlipidemia 07/18/2021  ? Class 1 obesity 07/18/2021  ? Stage 3b chronic kidney disease (CKD) ([place]) 07/18/2021  ? Hyperglycemia due to diabetes mellitus ([place]) 03/30/2020  ? Essential hypertension 03/30/2020  ? AKI (acute kidney injury) ([place]) 03/30/2020  ? Hyponatremia 03/30/2020  ? Pneumonia 03/29/2020  ? Leg swelling 01/03/2020  ? ? ?Past Medical  History: ?Past Medical History:  ?Diagnosis Date  ? Cataract   ? Mixed OU  ? Diabetes mellitus without complication ([place])   ? Diabetic retinopathy ([place])   ? NPDR OU  ? Hyperlipemia   ? Hyperlipidemia 07/18/2021  ? Hyper

## 2021-09-16 NOTE — H&P (View-Only) (Signed)
[facility] [place] ?CONSULT NOTE ? ? ?MRN : [id number] ? ?Reason for Consult: AKI on CKD V ?Referring Physician: Joelyn Oms ? ?History of Present Illness: 61 y/o female with history of CKD stage V, now ESRD. We have been asked to provide HD access and TDC placement.  She is right hand dominant.  She denies chest wall implants. ?Past medical history includes:  left ankle fracture requiring ORIF 09/16/21. ?DM, Hypercholesterolemia, HTN and CKD.   ?  ?  ? ? ?Current Facility-Administered Medications  ?Medication Dose Route Frequency Provider Last Rate Last Admin  ? atorvastatin (LIPITOR) tablet 40 mg  40 mg Oral Daily Eppie Gibson, MD   40 mg at 09/16/21 1404  ? benztropine (COGENTIN) tablet 1 mg  1 mg Oral QPM Eppie Gibson, MD      ? Derrill Memo ON 09/17/2021] Chlorhexidine Gluconate Cloth 2 % PADS 6 each  6 each Topical Q0600 Rexene Agent, MD      ? FLUoxetine (PROZAC) capsule 60 mg  60 mg Oral q morning Eppie Gibson, MD   60 mg at 09/16/21 1409  ? gabapentin (NEURONTIN) capsule 400 mg  400 mg Oral BID Eppie Gibson, MD   400 mg at 09/16/21 1409  ? heparin injection 5,000 Units  5,000 Units Subcutaneous Q8H Eppie Gibson, MD   5,000 Units at 09/16/21 1404  ? insulin aspart (novoLOG) injection 0-6 Units  0-6 Units Subcutaneous TID WC Eppie Gibson, MD      ? lurasidone (LATUDA) tablet 40 mg  40 mg Oral QHS Eppie Gibson, MD      ? metoprolol succinate (TOPROL-XL) 24 hr tablet 50 mg  50 mg Oral Daily Eppie Gibson, MD   50 mg at 09/16/21 1404  ? multivitamin with minerals tablet 1 tablet  1 tablet Oral Daily Eppie Gibson, MD   1 tablet at 09/16/21 1404  ? prazosin (MINIPRESS) capsule 1 mg  1 mg Oral QPM Eppie Gibson, MD      ? traZODone (DESYREL) tablet 300 mg  300 mg Oral QHS Eppie Gibson, MD      ? ?Current Outpatient Medications  ?Medication Sig Dispense Refill  ? atorvastatin (LIPITOR) 40 MG tablet Take 40 mg by mouth daily.  3  ? benztropine (COGENTIN) 1 MG  tablet Take 1 mg by mouth every evening.    ? CALCIUM PO Take 1 capsule by mouth daily.    ? FLUoxetine (PROZAC) 20 MG capsule Take 60 mg by mouth every morning.    ? gabapentin (NEURONTIN) 800 MG tablet Take 400 mg by mouth 2 (two) times daily.  3  ? glipiZIDE (GLUCOTROL) 10 MG tablet Take 5 mg by mouth 2 (two) times daily.    ? HUMALOG MIX 75/25 KWIKPEN (75-25) 100 UNIT/ML Kwikpen Inject 20 Units into the skin 2 (two) times daily as needed (High Blood Sugar).  3  ? LATUDA 40 MG TABS tablet Take 40 mg by mouth at bedtime.    ? metoprolol succinate (TOPROL-XL) 50 MG 24 hr tablet Take 50 mg by mouth daily.  3  ? Multiple Vitamin (MULTIVITAMIN WITH MINERALS) TABS tablet Take 1 tablet by mouth daily.    ? prazosin (MINIPRESS) 1 MG capsule Take 1 mg by mouth every evening.  3  ? Semaglutide, 1 MG/DOSE, 4 MG/3ML SOPN Inject 1.3 mg into the skin every Monday.    ? torsemide (DEMADEX) 20 MG tablet Take 40 mg by mouth  daily.    ? traZODone (DESYREL) 100 MG tablet Take 300 mg by mouth at bedtime.   3  ? Vitamin D, Ergocalciferol, (DRISDOL) 50000 units CAPS capsule Take 50,000 Units by mouth every Friday.  0  ? hydrOXYzine (ATARAX/VISTARIL) 25 MG tablet Take 75 mg by mouth daily.  (Patient not taking: Reported on 09/16/2021)  0  ? ? ?Pt meds include: ?Statin :Yes ?Betablocker: Yes ?ASA: No ?Other anticoagulants/antiplatelets: None ? ?Past Medical History:  ?Diagnosis Date  ? Cataract   ? Mixed OU  ? Diabetes mellitus without complication ([place])   ? Diabetic retinopathy ([place])   ? NPDR OU  ? Hyperlipemia   ? Hyperlipidemia 07/18/2021  ? Hypertension   ? Hypertensive retinopathy   ? OU  ? ? ?Past Surgical History:  ?Procedure Laterality Date  ? ANKLE ARTHROSCOPY WITH OPEN REDUCTION INTERNAL FIXATION (ORIF)    ? ? ?Social History ?Social History  ? ?Tobacco Use  ? Smoking status: Never  ? Smokeless tobacco: Never  ?Vaping Use  ? Vaping Use: Never used  ?Substance Use Topics  ? Alcohol use: Not Currently  ? Drug use: Not Currently   ? ? ?Family History ?Family History  ?Problem Relation Age of Onset  ? Diabetes Mother   ? Diabetes Father   ? Diabetes Sister   ? ? ?Allergies  ?Allergen Reactions  ? Vancomycin Other (See Comments)  ?  Red man syndrome  ? ? ? ?REVIEW OF SYSTEMS ? ?General: [ ]  Weight loss, [ ]  Fever, [ ]  chills ?Neurologic: [ ]  Dizziness, [ ]  Blackouts, [ ]  Seizure ?[ ]  Stroke, [ ]  "Mini stroke", [ ]  Slurred speech, [ ]  Temporary blindness; [ ]  weakness in arms or legs, [ ]  Hoarseness [ ]  Dysphagia ?Cardiac: [ ]  Chest pain/pressure, [ ]  Shortness of breath at rest [ ]  Shortness of breath with exertion, [ ]  Atrial fibrillation or irregular heartbeat  ?Vascular: [ ]  Pain in legs with walking, [ ]  Pain in legs at rest, [ ]  Pain in legs at night, ? [ ]  Non-healing ulcer, [ ]  Blood clot in vein/DVT,   ?Pulmonary: [ ]  Home oxygen, [ ]  Productive cough, [ ]  Coughing up blood, [ ]  Asthma, ? [ ]  Wheezing [ ]  COPD ?Musculoskeletal:  [ ]  Arthritis, [ ]  Low back pain, [x ] Joint pain ?Hematologic: [ ]  Easy Bruising, [ ]  Anemia; [ ]  Hepatitis ?Gastrointestinal: [ ]  Blood in stool, [ ]  Gastroesophageal Reflux/heartburn, ?Urinary: [x ] chronic Kidney disease, [ ]  on HD - [ ]  MWF or [ ]  TTHS, [ ]  Burning with urination, [ ]  Difficulty urinating ?Skin: [ ]  Rashes, [ ]  Wounds ?Psychological: [ ]  Anxiety, [ ]  Depression ? ?Physical Examination ?Vitals:  ? 09/16/21 1215 09/16/21 1300 09/16/21 1330 09/16/21 1400  ?BP: (!) 176/64 (!) 150/99 (!) 158/91 (!) 147/90  ?Pulse: 77 75 77 76  ?Resp: 16 12 15 18   ?Temp:      ?SpO2: 98% 99% 98% 98%  ?Weight:      ?Height:      ? ?Body mass index is 31.08 kg/m?. ? ?General:  WDWN in NAD ?Gait: Normal ?HENT: WNL ?Eyes: Pupils equal ?Pulmonary: normal non-labored breathing , without Rales, rhonchi,  wheezing ?Cardiac: RRR, without  Murmurs, rubs or gallops; ?No carotid bruits ?Abdomen: soft, NT, no masses ?Skin: no rashes, ulcers noted;  no Gangrene , no cellulitis; no open wounds;  ? ?Vascular Exam/Pulses:palpable  radial, brachial pulses B ? ? ?Musculoskeletal: no muscle wasting  or atrophy; no edema  ?Neurologic: A&O X 3; Appropriate Affect ;  ?SENSATION: normal; ?MOTOR FUNCTION: grossly intact, splint on left ankle ?Speech is fluent/normal ? ? ?Significant Diagnostic Studies: ?CBC ?Lab Results  ?Component Value Date  ? WBC 16.4 (H) 09/16/2021  ? HGB 10.4 (L) 09/16/2021  ? HCT 31.8 (L) 09/16/2021  ? MCV 89.8 09/16/2021  ? PLT 277 09/16/2021  ? ? ?BMET ?   ?Component Value Date/Time  ? NA 136 09/16/2021 0928  ? K 4.3 09/16/2021 0928  ? CL 96 (L) 09/16/2021 6837  ? CO2 24 09/16/2021 0928  ? GLUCOSE 182 (H) 09/16/2021 2902  ? BUN 86 (H) 09/16/2021 1115  ? CREATININE 8.06 (H) 09/16/2021 5208  ? CALCIUM 8.5 (L) 09/16/2021 0223  ? GFRNONAA 5 (L) 09/16/2021 3612  ? GFRAA 47 (L) 01/07/2018 1551  ? ?Estimated Creatinine Clearance: 8.7 mL/min (A) (by C-G formula based on SCr of 8.06 mg/dL (H)). ? ?COAG ?Lab Results  ?Component Value Date  ? INR 1.1 09/16/2021  ? ? ? ?Non-Invasive Vascular Imaging:  ?Pending vein mapping ? ?ASSESSMENT/PLAN:  ?AKI on CKD stage V now requiring HD access and TDC. ?She is right hand dominant and vein mapping is pending. ?We will plan left AV fistula verse graft.  Scheduling will be discussed with Dr. Virl Cagey.  ?Plan is for ORIF left ankle tomorrow with Dr. Marcelino Scot. ? ?Roxy Horseman ?09/16/2021 ?2:56 PM ? ?VASCULAR STAFF ADDENDUM: ?I have independently interviewed and examined the patient. ?I agree with the above.  ?Plan for [facility] immediately following ORIF while still under general anesthesia.  ?After discussing the risks and benefits, Clydette elected to proceed.  ? ?J. Melene Muller, MD ?[facility] ?Office Phone Number: [phone] ?09/16/2021 9:17 PM ? ? ? ?

## 2021-09-16 NOTE — ED Provider Notes (Signed)
?[facility] ?Provider Note ? ? ?CSN: [id number] ?Arrival date & time: 09/16/21  0300 ? ?  ? ?History ? ?Chief Complaint  ?Patient presents with  ? Foot Injury  ? ? ?Jessica Mercado is a 61 y.o. female with history significant for hypertension, type 2 diabetes, end-stage renal disease not on dialysis who presents to the ED for evaluation of a left ankle injury after a fall that occurred 2 nights ago.  Patient states that she was getting up and going to the bathroom when she tripped and fell rolling her ankle.  Her son had initially helped her back into bed, and this morning he reportedly noticed a deformity and called EMS.  EMS concerned that they cannot feel distal pulses in either lower extremity.  No other treatment prior to arrival.  Patient is ambulatory at home with the help of her walker.  She complains of pain described as "burning/cold".  She denies ever, chills and has no other complaints. ? ? ?Foot Injury ? ?  ? ?Home Medications ?Prior to Admission medications   ?Medication Sig Start Date End Date Taking? Authorizing Provider  ?atorvastatin (LIPITOR) 40 MG tablet Take 40 mg by mouth daily. 12/21/17   [provider]  ?BD PEN NEEDLE NANO 2ND GEN 32G X 4 MM MISC USE AS DIRECTED 2 TIMES DAILY 06/02/20   [provider]  ?FLUoxetine (PROZAC) 20 MG capsule Take 60 mg by mouth every morning. 08/09/20   [provider]  ?gabapentin (NEURONTIN) 800 MG tablet Take 800 mg by mouth 3 (three) times daily. 12/21/17   [provider]  ?glipiZIDE (GLUCOTROL) 10 MG tablet Take 10 mg by mouth 2 (two) times daily. 02/06/20   [provider]  ?HUMALOG MIX 75/25 KWIKPEN (75-25) 100 UNIT/ML Kwikpen Inject 40 Units into the skin 2 (two) times daily.  12/21/17   [provider]  ?hydrOXYzine (ATARAX/VISTARIL) 25 MG tablet Take 75 mg by mouth daily.  11/06/17   [provider]  ?LATUDA 40 MG TABS tablet Take 40 mg by mouth at bedtime.  03/27/20   [provider]  ?metoprolol succinate (TOPROL-XL) 50 MG 24 hr tablet Take 50 mg by mouth daily. 12/21/17   [provider]  ?OZEMPIC 0.25 or 0.5 MG/DOSE SOPN Inject 0.25 mg into the skin once a week. 01/04/18   [provider]  ?pantoprazole (PROTONIX) 20 MG tablet Take 20 mg by mouth daily. 07/18/20   [provider]  ?pramipexole (MIRAPEX) 0.125 MG tablet Take 0.125 mg by mouth every evening. 12/21/17   [provider]  ?prazosin (MINIPRESS) 1 MG capsule Take 1 mg by mouth every evening. 12/22/17   [provider]  ?silver sulfADIAZINE (SILVADENE) 1 % cream Apply 1 application topically daily. 02/06/20   [provider]  ?torsemide 40 MG TABS Take 40 mg by mouth daily. 07/25/21 08/24/21  Nolberto Hanlon, MD  ?traZODone (DESYREL) 100 MG tablet Take 300 mg by mouth at bedtime.  12/21/17   [provider]  ?Vitamin D, Ergocalciferol, (DRISDOL) 50000 units CAPS capsule Take 50,000 Units by mouth every Friday. 11/06/17   [provider]  ?   ? ?Allergies    ?Vancomycin   ? ?Review of Systems   ?Review of Systems ? ?Physical Exam ?Updated Vital Signs ?BP (!) 176/64   Pulse 77   Temp 98.3 ?F (36.8 ?C)   Resp 16   Ht 5\' 7"  (1.702 m)   Wt 90 kg  LMP  (LMP Unknown)   SpO2 98%   BMI 31.08 kg/m?  ?Physical Exam ?Vitals and nursing note reviewed.  ?Constitutional:   ?   General: She is not in acute distress. ?   Appearance: She is not ill-appearing.  ?HENT:  ?   Head: Atraumatic.  ?Eyes:  ?   Conjunctiva/sclera: Conjunctivae normal.  ?Cardiovascular:  ?   Rate and Rhythm: Normal rate and regular rhythm.  ?   Pulses:     ?     Radial pulses are 2+ on the right side and 2+ on the left side.  ?     Dorsalis pedis pulses are 1+ on the right side and 1+ on the left side.  ?   Heart sounds: No murmur heard. ?   Comments: DP pulses 1+ - confirmed with doppler ?Pulmonary:  ?   Effort: Pulmonary effort is normal. No respiratory distress.  ?   Breath  sounds: Normal breath sounds.  ?Abdominal:  ?   General: Abdomen is flat. There is no distension.  ?   Palpations: Abdomen is soft.  ?   Tenderness: There is no abdominal tenderness.  ?Musculoskeletal:     ?   General: Normal range of motion.  ?   Cervical back: Normal range of motion.  ?   Comments: Left ankle without significant swelling, only mildly tender to palpation. Left foot with external rotation. Motor function intact   ?Skin: ?   General: Skin is warm and dry.  ?   Capillary Refill: Capillary refill takes less than 2 seconds.  ?   Comments: Chronic skin changes of the lower left extremity - red, dry, tight skin and hairless  ?Neurological:  ?   General: No focal deficit present.  ?   Mental Status: She is alert.  ?Psychiatric:     ?   Mood and Affect: Mood normal.  ? ? ?ED Results / Procedures / Treatments   ?Labs ?(all labs ordered are listed, but only abnormal results are displayed) ?Labs Reviewed  ?CBC WITH DIFFERENTIAL/PLATELET - Abnormal; Notable for the following components:  ?    Result Value  ? WBC 16.4 (*)   ? RBC 3.54 (*)   ? Hemoglobin 10.4 (*)   ? HCT 31.8 (*)   ? Neutro Abs 14.0 (*)   ? All other components within normal limits  ?BASIC METABOLIC PANEL - Abnormal; Notable for the following components:  ? Chloride 96 (*)   ? Glucose, Bld 182 (*)   ? BUN 86 (*)   ? Creatinine, Ser 8.06 (*)   ? Calcium 8.5 (*)   ? GFR, Estimated 5 (*)   ? Anion gap 16 (*)   ? All other components within normal limits  ? ? ?EKG ?None ? ?Radiology ?DG Ankle Complete Left ? ?Result Date: 09/16/2021 ?CLINICAL DATA:  Ankle pain after fall EXAM: LEFT ANKLE COMPLETE - 3+ VIEW COMPARISON:  None. FINDINGS: Oblique distal fibular fracture, at and above the ankle joint. More horizontal medial malleolus fracture. Posterior malleolus fracture with posterior displacement. The ankle is lateral and posteriorly subluxed. IMPRESSION: Displaced trimalleolar ankle fractures with subluxed ankle joint. Electronically Signed   By:  Jorje Guild M.D.   On: 09/16/2021 07:33  ? ?CT Ankle Left Wo Contrast ? ?Result Date: 09/16/2021 ?CLINICAL DATA:  Ankle trauma, fracture, xray done (Age >= 5y) EXAM: CT OF THE LEFT ANKLE WITHOUT CONTRAST TECHNIQUE: Multidetector CT imaging of the left ankle was performed according to the standard  protocol. Multiplanar CT image reconstructions were also generated. RADIATION DOSE REDUCTION: This exam was performed according to the departmental dose-optimization program which includes automated exposure control, adjustment of the mA and/or kV according to patient size and/or use of iterative reconstruction technique. COMPARISON:  There is a radiograph FINDINGS: Bones/Joint/Cartilage There is a displaced trimalleolar ankle fracture. Medial malleolar fracture at the base of the malleolus is angulated with up to 6 mm displacement and with significant comminution. Comminuted lateral malleolar fracture demonstrates mild posterolateral displacement and angulation, degree of displacement up to 6 mm. Posterior malleolar fracture is displaced up to 1.1 cm and involves approximately 25% of the articular surface. Additionally, there is a fracture of the anteromedial tibial plafond involving approximately 20% of the articular surface. Prominent os navicularis. There is mild talonavicular osteoarthritis. Os trigonum. Ligaments Suboptimally assessed by CT. Muscles and Tendons There is thickening and ossification along the mid Achilles consistent with chronic Achilles tendinosis. The posterior tibial tendon abuts the medial malleolar fracture fragments but there is no definite tendon entrapment. Soft tissues There is significant ankle soft tissue swelling. IMPRESSION: Displaced and comminuted trimalleolar fracture as described above, with posterior malleolar and anteromedial tibial plafond fractures involving a significant degree of the distal tibia articular surface. The posterior tibial tendon abuts medial malleolar fracture  fragments, but there is no definite tendon entrapment. Findings of chronic mid Achilles tendinosis. Electronically Signed   By: Maurine Simmering M.D.   On: 09/16/2021 11:06  ? ?DG Ankle Left Port ? ?Result Date: 09/16/2021 ?CL

## 2021-09-16 NOTE — Consult Note (Signed)
Jessica Mercado ?Admit Date: 09/16/2021 ?09/16/2021 ?Rexene Agent ?Requesting Physician:  Long MD ? ?Reason for Consult:  CKD5, confusion, falls, ankle fracture of L ? ?HPI:  ?17F who I follow in the outpatient clinic as well as with her recent admission last month for hypervolemia in the setting of advancing CKD.  She presented to the ER earlier today and was found to have a left ankle fracture after having several falls, confusion, fatigue, syncope. ? ?She has been seen by orthopedics, has a trimalleolar fracture, and will tentatively go to the OR tomorrow for ORIF.  In the ED her creatinine had worsened to 8.06 from a recent baseline around 6.0.  Thankfully, potassium is stable at 4.3, BUN 86.  Her hemoglobin is 10.4.  She acknowledges that many of her symptoms are caused or worsened by symptomatic chronic kidney disease and that she is now ESRD.  She agrees to start hemodialysis. ? ?PMH Incudes: DM2, BPAD, hypertension ? ?Creatinine, Ser (mg/dL)  ?Date Value  ?09/16/2021 8.06 (H)  ?09/08/2021 6.41 (H)  ?07/24/2021 6.00 (H)  ?07/23/2021 5.69 (H)  ?07/22/2021 5.71 (H)  ?07/21/2021 5.11 (H)  ?07/20/2021 5.01 (H)  ?07/19/2021 4.67 (H)  ?07/18/2021 4.69 (H)  ?04/01/2020 1.76 (H)  ?] ? ?ROS ?Balance of 12 systems is negative w/ exceptions as above ? ?PMH  ?Past Medical History:  ?Diagnosis Date  ? Cataract   ? Mixed OU  ? Diabetes mellitus without complication ([place])   ? Diabetic retinopathy ([place])   ? NPDR OU  ? Hyperlipemia   ? Hyperlipidemia 07/18/2021  ? Hypertension   ? Hypertensive retinopathy   ? OU  ? ?PSH  ?Past Surgical History:  ?Procedure Laterality Date  ? ANKLE ARTHROSCOPY WITH OPEN REDUCTION INTERNAL FIXATION (ORIF)    ? ?FH  ?Family History  ?Problem Relation Age of Onset  ? Diabetes Mother   ? Diabetes Father   ? Diabetes Sister   ? ?SH  reports that she has never smoked. She has never used smokeless tobacco. She reports that she does not currently use alcohol. She reports that she does not currently use  drugs. ?Allergies  ?Allergies  ?Allergen Reactions  ? Vancomycin Other (See Comments)  ?  Red man syndrome  ? ?Home medications ?Prior to Admission medications   ?Medication Sig Start Date End Date Taking? Authorizing Provider  ?atorvastatin (LIPITOR) 40 MG tablet Take 40 mg by mouth daily. 12/21/17  Yes [provider]  ?benztropine (COGENTIN) 1 MG tablet Take 1 mg by mouth every evening. 09/10/21  Yes [provider]  ?CALCIUM PO Take 1 capsule by mouth daily.   Yes [provider]  ?FLUoxetine (PROZAC) 20 MG capsule Take 60 mg by mouth every morning. 08/09/20  Yes [provider]  ?gabapentin (NEURONTIN) 800 MG tablet Take 400 mg by mouth 2 (two) times daily. 12/21/17  Yes [provider]  ?glipiZIDE (GLUCOTROL) 10 MG tablet Take 5 mg by mouth 2 (two) times daily. 02/06/20  Yes [provider]  ?HUMALOG MIX 75/25 KWIKPEN (75-25) 100 UNIT/ML Kwikpen Inject 20 Units into the skin 2 (two) times daily as needed (High Blood Sugar). 12/21/17  Yes [provider]  ?LATUDA 40 MG TABS tablet Take 40 mg by mouth at bedtime. 03/27/20  Yes [provider]  ?metoprolol succinate (TOPROL-XL) 50 MG 24 hr tablet Take 50 mg by mouth daily. 12/21/17  Yes [provider]  ?Multiple Vitamin (MULTIVITAMIN WITH MINERALS) TABS tablet Take 1 tablet by mouth daily.  Yes [provider]  ?prazosin (MINIPRESS) 1 MG capsule Take 1 mg by mouth every evening. 12/22/17  Yes [provider]  ?Semaglutide, 1 MG/DOSE, 4 MG/3ML SOPN Inject 1.3 mg into the skin every Monday.   Yes [provider]  ?torsemide (DEMADEX) 20 MG tablet Take 40 mg by mouth daily.   Yes [provider]  ?traZODone (DESYREL) 100 MG tablet Take 300 mg by mouth at bedtime.  12/21/17  Yes [provider]  ?Vitamin D, Ergocalciferol, (DRISDOL) 50000 units CAPS capsule Take 50,000 Units by mouth every Friday. 11/06/17  Yes [provider]  ?hydrOXYzine  (ATARAX/VISTARIL) 25 MG tablet Take 75 mg by mouth daily.  ?Patient not taking: Reported on 09/16/2021 11/06/17   [provider]  ? ? ?Current Medications ?Scheduled Meds: ? atorvastatin  40 mg Oral Daily  ? benztropine  1 mg Oral QPM  ? FLUoxetine  60 mg Oral q morning  ? gabapentin  400 mg Oral BID  ? heparin  5,000 Units Subcutaneous Q8H  ? insulin aspart  0-6 Units Subcutaneous TID WC  ? lurasidone  40 mg Oral QHS  ? metoprolol succinate  50 mg Oral Daily  ? multivitamin with minerals  1 tablet Oral Daily  ? prazosin  1 mg Oral QPM  ? traZODone  300 mg Oral QHS  ? ?Continuous Infusions: ?PRN Meds:. ? ?CBC ?Recent Labs  ?Lab 09/16/21 ?0928  ?WBC 16.4*  ?NEUTROABS 14.0*  ?HGB 10.4*  ?HCT 31.8*  ?MCV 89.8  ?PLT 277  ? ?Basic Metabolic Panel ?Recent Labs  ?Lab 09/16/21 ?0928  ?NA 136  ?K 4.3  ?CL 96*  ?CO2 24  ?GLUCOSE 182*  ?BUN 86*  ?CREATININE 8.06*  ?CALCIUM 8.5*  ? ? ?Physical Exam   ?Blood pressure (!) 147/90, pulse 76, temperature 98.3 ?F (36.8 ?C), resp. rate 18, height 5\' 7"  (1.702 m), weight 90 kg, SpO2 98 %. ?GEN: Chronically ill-appearing, conversant, slow, clearly not as cognitively sharp as the last time I saw her ?ENT: NCAT ?EYES: EOMI ?CV: Regular, no discernible rub ?PULM: Clear bilaterally ?ABD: Soft, nontender ?SKIN: Left foot/ankle bandaged ?EXT: No significant edema ? ?Assessment ?65F s/p fall and L ankle fracture with progressive CKD5 now uremic, ESRD ? 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 ?Plan ?Needs to start HD, she is inagreement ?[facility] consulted for [facility] and AVF eval ?Vein mapping ordered ?Will need CLIP for outpt HD ?First HD after TDC: 200/300, 2h, 0.5L UF, No heparin, 2K ?Check Fe stores, PTH ?HBV Serologies ? ? ?Rexene Agent  ?09/16/2021, 2:27 PM ? ? ?  ?

## 2021-09-16 NOTE — Progress Notes (Signed)
FPTS Brief Progress Note ? ?S: Patient asleep with the television on ? ? ?O: ?BP (!) 165/85   Pulse 78   Temp 98.3 ?F (36.8 ?C)   Resp 10   Ht 5\' 7"  (1.702 m)   Wt 90 kg   LMP  (LMP Unknown)   SpO2 96%   BMI 31.08 kg/m?   ? ? ?A/P: ?Plan for ORIF with orthopedics tomorrow for left trimalleolar fracture.  Also will be planning to initiate HD this admission.  N.p.o. after midnight. ?- Orders reviewed. Labs for AM ordered, which was adjusted as needed.  ?- If condition changes, plan includes offered Tylenol for pain.  If pain not well controlled, consider adding on synthetic opioid such as hydromorphone or oxycodone. ? ?Zola Button, MD ?09/16/2021, 11:00 PM ?PGY-2, [facility] Night Resident  ?Please page [phone] with questions.  ?  ?

## 2021-09-16 NOTE — Progress Notes (Signed)
Transition of Care [facility]) - Emergency Department Mini Assessment ? ? ?Patient Details  ?Name: Jessica Mercado ?MRN: [id number] ?Date of Birth: 12/20/1960 ? ?Transition of Care (TOC) CM/SW Contact:    ?Roseanne Kaufman, RN ?Phone Number: ?09/16/2021, 11:38 AM ? ? ?Clinical Narrative: ?Patient presented to [facility] ED due to recent fall. RNCM received TOC consult for HHC: PT & OT and DME: wheelchair. ? ? ?ED Mini Assessment: ? This RNCM spoke with [name] [phone] per [facility] consult. This RNCM advised that referrals will be sent for services. Awaiting response from [employer] for wheelchair. RNCM  awaiting response from [facility] referrals sent: [facility], [facility], [facility]  ? ? ?Patient Contact and Communications ?  ? [name]: [phone] ?  ?  ?Admission diagnosis:  fall ?Patient Active Problem List  ? Diagnosis Date Noted  ? 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 Hypertensive urgency 07/20/2021  ? Elevated d-dimer 07/20/2021  ? Elevated troponin 07/20/2021  ? Acute renal failure (ARF) ([place]) 07/19/2021  ? Acute dyspnea 07/18/2021  ? Type 2 diabetes mellitus ([place]) 07/18/2021  ? Anemia associated with chronic renal failure 07/18/2021  ? Hyperlipidemia 07/18/2021  ? Class 1 obesity 07/18/2021  ? Stage 3b chronic kidney disease (CKD) ([place]) 07/18/2021  ? Hyperglycemia due to diabetes mellitus ([place]) 03/30/2020  ? Essential hypertension 03/30/2020  ? AKI (acute kidney injury) ([place]) 03/30/2020  ? Hyponatremia 03/30/2020  ? Pneumonia 03/29/2020  ? Leg swelling 01/03/2020  ? ?PCP:  Vassie Moment, MD ?Pharmacy:   ?[id number] - [place], [address] ?[place] ?[place] [address] ?Phone: [phone] Fax: [phone] ?  ?

## 2021-09-16 NOTE — Progress Notes (Signed)
Orthopedic Tech Progress Note ?Patient Details:  ?MASHAYLA LAVIN ?[birth year] ?[id number] ? ?Ortho Devices ?Type of Ortho Device: Stirrup splint, Short leg splint ?Ortho Device/Splint Location: LLE ?Ortho Device/Splint Interventions: Ordered, Application, Adjustment ?  ?Post Interventions ?Patient Tolerated: Well ?Instructions Provided: Care of device ? ?Janit Pagan ?09/16/2021, 9:07 AM ? ?

## 2021-09-16 NOTE — ED Provider Notes (Signed)
Medical screening examination/treatment/procedure(s) were conducted as a shared visit with non-physician practitioner(s) and myself.  I personally evaluated the patient during the encounter. ? ?Patient presents 2-3 days after fall with ankle pain. Patient has apparently been attempting to walk at home on the ankle. Son noticed deformity and advised ED evaluation. I independently interpreted the initial x-ray and post-reduction films. Coordinated with Silvestre Gunner PA-C with orthopedics. Advises CT ankle in the ED and home with plan to see Dr. Marcelino Scot in the office on Wednesday. Patient will need to call for an appointment. Will coordinate with case mgmt for home DME, wheelchair.  ? ?Reduction of fracture ? ?Date/Time: 09/16/2021 9:37 AM ?Performed by: Margette Fast, MD ?Authorized by: Margette Fast, MD  ?Consent: Verbal consent obtained. ?Risks and benefits: risks, benefits and alternatives were discussed ?Consent given by: patient ?Required items: required blood products, implants, devices, and special equipment available ?Patient identity confirmed: arm band ?Local anesthesia used: no ? ?Anesthesia: ?Local anesthesia used: no ? ?Sedation: ?Patient sedated: no ? ?Patient tolerance: patient tolerated the procedure well with no immediate complications ?Comments: Patient with minimal tenderness with touching the left ankle.  Mild swelling appreciated.  I came to the bedside with the orthopedic tech and placed a splint.  After splint was placed, I applied medial and anterior pressure to shift the ankle joint back into place. Patient tolerated well with minimal discomfort. Post-reduction films show improved alignment.  ? ? ? ? ?Nanda Quinton, MD ?Emergency Medicine ? ?  ?Margette Fast, MD ?09/24/21 [phone] ? ?

## 2021-09-16 NOTE — Consult Note (Signed)
Reason for Consult:Left ankle fx ?Referring Physician: Todd McDiarmid ?Time called: 4403 ?Time at bedside: 1216 ? ? ?Jessica Mercado is an 61 y.o. female.  ?HPI: Jessica Mercado was at home and fell yesterday. She was helped back to bed by her SIL who noticed the ankle was deformed today and brought her for evaluation. X-rays showed a trimal fx. She was reduced by EDP and orthopedic surgery was consulted. She says she walked on it but she seems a bit confused and I'm not sure how much of the history can be trusted. She lives at home with her SIL and ambulates with a RW. ? ?Past Medical History:  ?Diagnosis Date  ? Cataract   ? Mixed OU  ? Diabetes mellitus without complication ([place])   ? Diabetic retinopathy ([place])   ? NPDR OU  ? Hyperlipemia   ? Hyperlipidemia 07/18/2021  ? Hypertension   ? Hypertensive retinopathy   ? OU  ? ? ?Past Surgical History:  ?Procedure Laterality Date  ? ANKLE ARTHROSCOPY WITH OPEN REDUCTION INTERNAL FIXATION (ORIF)    ? ? ?Family History  ?Problem Relation Age of Onset  ? Diabetes Mother   ? Diabetes Father   ? Diabetes Sister   ? ? ?Social History:  reports that she has never smoked. She has never used smokeless tobacco. She reports that she does not currently use alcohol. She reports that she does not currently use drugs. ? ?Allergies:  ?Allergies  ?Allergen Reactions  ? Vancomycin Other (See Comments)  ?  Red man syndrome  ? ? ?Medications: I have reviewed the patient's current medications. ? ?Results for orders placed or performed during the hospital encounter of 09/16/21 (from the past 48 hour(s))  ?CBC with Differential     Status: Abnormal  ? Collection Time: 09/16/21  9:28 AM  ?Result Value Ref Range  ? WBC 16.4 (H) 4.0 - 10.5 K/uL  ? RBC 3.54 (L) 3.87 - 5.11 MIL/uL  ? Hemoglobin 10.4 (L) 12.0 - 15.0 g/dL  ? HCT 31.8 (L) 36.0 - 46.0 %  ? MCV 89.8 80.0 - 100.0 fL  ? MCH 29.4 26.0 - 34.0 pg  ? MCHC 32.7 30.0 - 36.0 g/dL  ? RDW 12.9 11.5 - 15.5 %  ? Platelets 277 150 - 400 K/uL  ? nRBC 0.0 0.0 -  0.2 %  ? Neutrophils Relative % 85 %  ? Neutro Abs 14.0 (H) 1.7 - 7.7 K/uL  ? Lymphocytes Relative 10 %  ? Lymphs Abs 1.6 0.7 - 4.0 K/uL  ? Monocytes Relative 4 %  ? Monocytes Absolute 0.6 0.1 - 1.0 K/uL  ? Eosinophils Relative 1 %  ? Eosinophils Absolute 0.1 0.0 - 0.5 K/uL  ? Basophils Relative 0 %  ? Basophils Absolute 0.1 0.0 - 0.1 K/uL  ? Immature Granulocytes 0 %  ? Abs Immature Granulocytes 0.07 0.00 - 0.07 K/uL  ?  Comment: Performed at [facility], [place] [address], [place], [place] [address]  ?Basic metabolic panel     Status: Abnormal  ? Collection Time: 09/16/21  9:28 AM  ?Result Value Ref Range  ? Sodium 136 135 - 145 mmol/L  ? Potassium 4.3 3.5 - 5.1 mmol/L  ? Chloride 96 (L) 98 - 111 mmol/L  ? CO2 24 22 - 32 mmol/L  ? Glucose, Bld 182 (H) 70 - 99 mg/dL  ?  Comment: Glucose reference range applies only to samples taken after fasting for at least 8 hours.  ? BUN 86 (H)  6 - 20 mg/dL  ? Creatinine, Ser 8.06 (H) 0.44 - 1.00 mg/dL  ? Calcium 8.5 (L) 8.9 - 10.3 mg/dL  ? GFR, Estimated 5 (L) >60 mL/min  ?  Comment: (NOTE) ?Calculated using the CKD-EPI Creatinine Equation (2021) ?  ? Anion gap 16 (H) 5 - 15  ?  Comment: Performed at [facility], [place] [address] [place], [place] [address]  ? ? ?DG Ankle Complete Left ? ?Result Date: 09/16/2021 ?CLINICAL DATA:  Ankle pain after fall EXAM: LEFT ANKLE COMPLETE - 3+ VIEW COMPARISON:  None. FINDINGS: Oblique distal fibular fracture, at and above the ankle joint. More horizontal medial malleolus fracture. Posterior malleolus fracture with posterior displacement. The ankle is lateral and posteriorly subluxed. IMPRESSION: Displaced trimalleolar ankle fractures with subluxed ankle joint. Electronically Signed   By: Jorje Guild M.D.   On: 09/16/2021 07:33  ? ?CT Ankle Left Wo Contrast ? ?Result Date: 09/16/2021 ?CLINICAL DATA:  Ankle trauma, fracture, xray done (Age >= 5y) EXAM: CT OF THE LEFT ANKLE WITHOUT CONTRAST TECHNIQUE: Multidetector CT imaging of the  left ankle was performed according to the standard protocol. Multiplanar CT image reconstructions were also generated. RADIATION DOSE REDUCTION: This exam was performed according to the departmental dose-optimization program which includes automated exposure control, adjustment of the mA and/or kV according to patient size and/or use of iterative reconstruction technique. COMPARISON:  There is a radiograph FINDINGS: Bones/Joint/Cartilage There is a displaced trimalleolar ankle fracture. Medial malleolar fracture at the base of the malleolus is angulated with up to 6 mm displacement and with significant comminution. Comminuted lateral malleolar fracture demonstrates mild posterolateral displacement and angulation, degree of displacement up to 6 mm. Posterior malleolar fracture is displaced up to 1.1 cm and involves approximately 25% of the articular surface. Additionally, there is a fracture of the anteromedial tibial plafond involving approximately 20% of the articular surface. Prominent os navicularis. There is mild talonavicular osteoarthritis. Os trigonum. Ligaments Suboptimally assessed by CT. Muscles and Tendons There is thickening and ossification along the mid Achilles consistent with chronic Achilles tendinosis. The posterior tibial tendon abuts the medial malleolar fracture fragments but there is no definite tendon entrapment. Soft tissues There is significant ankle soft tissue swelling. IMPRESSION: Displaced and comminuted trimalleolar fracture as described above, with posterior malleolar and anteromedial tibial plafond fractures involving a significant degree of the distal tibia articular surface. The posterior tibial tendon abuts medial malleolar fracture fragments, but there is no definite tendon entrapment. Findings of chronic mid Achilles tendinosis. Electronically Signed   By: Maurine Simmering M.D.   On: 09/16/2021 11:06  ? ?DG Ankle Left Port ? ?Result Date: 09/16/2021 ?CLINICAL DATA:  Post reduction.  EXAM: PORTABLE LEFT ANKLE - 2 VIEW COMPARISON:  Earlier same day. FINDINGS: Two views study shows fine bony detail obscured by overlying fiberglass. Trimalleolar fracture again noted with interval improvement in bony alignment status post closed reduction. IMPRESSION: Interval improvement in alignment status post reduction. Electronically Signed   By: Misty Stanley M.D.   On: 09/16/2021 09:02   ? ?Review of Systems  ?HENT:  Negative for ear discharge, ear pain, hearing loss and tinnitus.   ?Eyes:  Negative for photophobia and pain.  ?Respiratory:  Negative for cough and shortness of breath.   ?Cardiovascular:  Negative for chest pain.  ?Gastrointestinal:  Negative for abdominal pain, nausea and vomiting.  ?Genitourinary:  Negative for dysuria, flank pain, frequency and urgency.  ?Musculoskeletal:  Positive for arthralgias (Left ankle). Negative for back pain,  myalgias and neck pain.  ?Neurological:  Negative for dizziness and headaches.  ?Hematological:  Does not bruise/bleed easily.  ?Psychiatric/Behavioral:  The patient is not nervous/anxious.   ?Blood pressure (!) 176/64, pulse 77, temperature 98.3 ?F (36.8 ?C), resp. rate 16, height 5\' 7"  (1.702 m), weight 90 kg, SpO2 98 %. ?Physical Exam ?Constitutional:   ?   General: She is not in acute distress. ?   Appearance: She is well-developed. She is not diaphoretic.  ?HENT:  ?   Head: Normocephalic and atraumatic.  ?Eyes:  ?   General: No scleral icterus.    ?   Right eye: No discharge.     ?   Left eye: No discharge.  ?   Conjunctiva/sclera: Conjunctivae normal.  ?Cardiovascular:  ?   Rate and Rhythm: Normal rate and regular rhythm.  ?Pulmonary:  ?   Effort: Pulmonary effort is normal. No respiratory distress.  ?Musculoskeletal:  ?   Cervical back: Normal range of motion.  ?   Comments: LLE No traumatic wounds, ecchymosis, or rash ? Short leg splint in place ? No knee or ankle effusion ? Knee stable to varus/ valgus and anterior/posterior stress ? Sens DPN, SPN, TN  intact ? Motor EHL 5/5 ? Toes perfused, No significant edema  ?Skin: ?   General: Skin is warm and dry.  ?Neurological:  ?   Mental Status: She is alert.  ?Psychiatric:     ?   Mood and Affect: Mood no

## 2021-09-17 ENCOUNTER — Inpatient Hospital Stay (HOSPITAL_COMMUNITY): Payer: Medicaid Other

## 2021-09-17 ENCOUNTER — Other Ambulatory Visit: Payer: Self-pay

## 2021-09-17 ENCOUNTER — Encounter (HOSPITAL_COMMUNITY)
Admission: EM | Disposition: A | Discharge status: Rehabilitation Facility | Payer: Self-pay | Source: Home / Self Care | Attending: Family Medicine

## 2021-09-17 ENCOUNTER — Observation Stay (HOSPITAL_COMMUNITY): Payer: Medicaid Other

## 2021-09-17 ENCOUNTER — Inpatient Hospital Stay (HOSPITAL_COMMUNITY): Payer: Medicaid Other | Admitting: Certified Registered"

## 2021-09-17 ENCOUNTER — Encounter (HOSPITAL_COMMUNITY): Payer: Self-pay | Admitting: Family Medicine

## 2021-09-17 DIAGNOSIS — S82842S Displaced bimalleolar fracture of left lower leg, sequela: Secondary | ICD-10-CM | POA: Diagnosis not present

## 2021-09-17 DIAGNOSIS — F319 Bipolar disorder, unspecified: Secondary | ICD-10-CM | POA: Diagnosis not present

## 2021-09-17 DIAGNOSIS — I132 Hypertensive heart and chronic kidney disease with heart failure and with stage 5 chronic kidney disease, or end stage renal disease: Secondary | ICD-10-CM

## 2021-09-17 DIAGNOSIS — R55 Syncope and collapse: Secondary | ICD-10-CM | POA: Diagnosis present

## 2021-09-17 DIAGNOSIS — Y92013 Bedroom of single-family (private) house as the place of occurrence of the external cause: Secondary | ICD-10-CM | POA: Diagnosis not present

## 2021-09-17 DIAGNOSIS — E1169 Type 2 diabetes mellitus with other specified complication: Secondary | ICD-10-CM | POA: Diagnosis not present

## 2021-09-17 DIAGNOSIS — E1122 Type 2 diabetes mellitus with diabetic chronic kidney disease: Secondary | ICD-10-CM | POA: Diagnosis present

## 2021-09-17 DIAGNOSIS — T8241XA Breakdown (mechanical) of vascular dialysis catheter, initial encounter: Secondary | ICD-10-CM | POA: Diagnosis not present

## 2021-09-17 DIAGNOSIS — E1110 Type 2 diabetes mellitus with ketoacidosis without coma: Secondary | ICD-10-CM | POA: Diagnosis not present

## 2021-09-17 DIAGNOSIS — Z881 Allergy status to other antibiotic agents status: Secondary | ICD-10-CM | POA: Diagnosis not present

## 2021-09-17 DIAGNOSIS — N189 Chronic kidney disease, unspecified: Secondary | ICD-10-CM | POA: Diagnosis not present

## 2021-09-17 DIAGNOSIS — S82852D Displaced trimalleolar fracture of left lower leg, subsequent encounter for closed fracture with routine healing: Secondary | ICD-10-CM | POA: Diagnosis not present

## 2021-09-17 DIAGNOSIS — T8249XA Other complication of vascular dialysis catheter, initial encounter: Secondary | ICD-10-CM | POA: Diagnosis not present

## 2021-09-17 DIAGNOSIS — G4700 Insomnia, unspecified: Secondary | ICD-10-CM | POA: Diagnosis present

## 2021-09-17 DIAGNOSIS — N179 Acute kidney failure, unspecified: Secondary | ICD-10-CM | POA: Diagnosis present

## 2021-09-17 DIAGNOSIS — E871 Hypo-osmolality and hyponatremia: Secondary | ICD-10-CM | POA: Diagnosis not present

## 2021-09-17 DIAGNOSIS — D631 Anemia in chronic kidney disease: Secondary | ICD-10-CM | POA: Diagnosis present

## 2021-09-17 DIAGNOSIS — N186 End stage renal disease: Secondary | ICD-10-CM

## 2021-09-17 DIAGNOSIS — E118 Type 2 diabetes mellitus with unspecified complications: Secondary | ICD-10-CM | POA: Diagnosis not present

## 2021-09-17 DIAGNOSIS — Z833 Family history of diabetes mellitus: Secondary | ICD-10-CM | POA: Diagnosis not present

## 2021-09-17 DIAGNOSIS — Z820 Family history of epilepsy and other diseases of the nervous system: Secondary | ICD-10-CM | POA: Diagnosis not present

## 2021-09-17 DIAGNOSIS — W010XXA Fall on same level from slipping, tripping and stumbling without subsequent striking against object, initial encounter: Secondary | ICD-10-CM | POA: Diagnosis present

## 2021-09-17 DIAGNOSIS — I509 Heart failure, unspecified: Secondary | ICD-10-CM

## 2021-09-17 DIAGNOSIS — Z4689 Encounter for fitting and adjustment of other specified devices: Secondary | ICD-10-CM | POA: Diagnosis not present

## 2021-09-17 DIAGNOSIS — M19072 Primary osteoarthritis, left ankle and foot: Secondary | ICD-10-CM | POA: Diagnosis present

## 2021-09-17 DIAGNOSIS — N185 Chronic kidney disease, stage 5: Secondary | ICD-10-CM | POA: Diagnosis not present

## 2021-09-17 DIAGNOSIS — H35033 Hypertensive retinopathy, bilateral: Secondary | ICD-10-CM | POA: Diagnosis present

## 2021-09-17 DIAGNOSIS — I120 Hypertensive chronic kidney disease with stage 5 chronic kidney disease or end stage renal disease: Secondary | ICD-10-CM | POA: Diagnosis not present

## 2021-09-17 DIAGNOSIS — Z7984 Long term (current) use of oral hypoglycemic drugs: Secondary | ICD-10-CM | POA: Diagnosis not present

## 2021-09-17 DIAGNOSIS — Y712 Prosthetic and other implants, materials and accessory cardiovascular devices associated with adverse incidents: Secondary | ICD-10-CM | POA: Diagnosis not present

## 2021-09-17 DIAGNOSIS — E11311 Type 2 diabetes mellitus with unspecified diabetic retinopathy with macular edema: Secondary | ICD-10-CM | POA: Diagnosis present

## 2021-09-17 DIAGNOSIS — E669 Obesity, unspecified: Secondary | ICD-10-CM | POA: Diagnosis not present

## 2021-09-17 DIAGNOSIS — G2581 Restless legs syndrome: Secondary | ICD-10-CM | POA: Diagnosis present

## 2021-09-17 DIAGNOSIS — G8918 Other acute postprocedural pain: Secondary | ICD-10-CM | POA: Diagnosis not present

## 2021-09-17 DIAGNOSIS — Z79899 Other long term (current) drug therapy: Secondary | ICD-10-CM | POA: Diagnosis not present

## 2021-09-17 DIAGNOSIS — Z992 Dependence on renal dialysis: Secondary | ICD-10-CM | POA: Diagnosis not present

## 2021-09-17 DIAGNOSIS — Z794 Long term (current) use of insulin: Secondary | ICD-10-CM | POA: Diagnosis not present

## 2021-09-17 DIAGNOSIS — I5042 Chronic combined systolic (congestive) and diastolic (congestive) heart failure: Secondary | ICD-10-CM | POA: Diagnosis present

## 2021-09-17 DIAGNOSIS — N390 Urinary tract infection, site not specified: Secondary | ICD-10-CM | POA: Diagnosis present

## 2021-09-17 DIAGNOSIS — N19 Unspecified kidney failure: Secondary | ICD-10-CM | POA: Diagnosis not present

## 2021-09-17 DIAGNOSIS — Z66 Do not resuscitate: Secondary | ICD-10-CM | POA: Diagnosis present

## 2021-09-17 DIAGNOSIS — G9349 Other encephalopathy: Secondary | ICD-10-CM | POA: Diagnosis present

## 2021-09-17 DIAGNOSIS — S82851A Displaced trimalleolar fracture of right lower leg, initial encounter for closed fracture: Secondary | ICD-10-CM | POA: Diagnosis not present

## 2021-09-17 DIAGNOSIS — I10 Essential (primary) hypertension: Secondary | ICD-10-CM | POA: Diagnosis not present

## 2021-09-17 DIAGNOSIS — S82852A Displaced trimalleolar fracture of left lower leg, initial encounter for closed fracture: Secondary | ICD-10-CM | POA: Diagnosis present

## 2021-09-17 HISTORY — PX: INSERTION OF DIALYSIS CATHETER: SHX1324

## 2021-09-17 HISTORY — PX: ORIF ANKLE FRACTURE: SHX5408

## 2021-09-17 LAB — HEPATITIS B SURFACE ANTIBODY,QUALITATIVE: Hep B S Ab: NONREACTIVE

## 2021-09-17 LAB — RENAL FUNCTION PANEL
Albumin: 2.6 g/dL — ABNORMAL LOW (ref 3.5–5.0)
Anion gap: 11 (ref 5–15)
BUN: 90 mg/dL — ABNORMAL HIGH (ref 6–20)
CO2: 25 mmol/L (ref 22–32)
Calcium: 8.4 mg/dL — ABNORMAL LOW (ref 8.9–10.3)
Chloride: 100 mmol/L (ref 98–111)
Creatinine, Ser: 7.78 mg/dL — ABNORMAL HIGH (ref 0.44–1.00)
GFR, Estimated: 6 mL/min — ABNORMAL LOW (ref >60)
Glucose, Bld: 113 mg/dL — ABNORMAL HIGH (ref 70–99)
Phosphorus: 5.1 mg/dL — ABNORMAL HIGH (ref 2.5–4.6)
Potassium: 3.8 mmol/L (ref 3.5–5.1)
Sodium: 136 mmol/L (ref 135–145)

## 2021-09-17 LAB — VITAMIN B12: Vitamin B-12: 6354 pg/mL — ABNORMAL HIGH (ref 180–914)

## 2021-09-17 LAB — CBC
HCT: 25.4 % — ABNORMAL LOW (ref 36.0–46.0)
Hemoglobin: 8.4 g/dL — ABNORMAL LOW (ref 12.0–15.0)
MCH: 29.1 pg (ref 26.0–34.0)
MCHC: 33.1 g/dL (ref 30.0–36.0)
MCV: 87.9 fL (ref 80.0–100.0)
Platelets: 269 10*3/uL (ref 150–400)
RBC: 2.89 MIL/uL — ABNORMAL LOW (ref 3.87–5.11)
RDW: 12.8 % (ref 11.5–15.5)
WBC: 10.4 10*3/uL (ref 4.0–10.5)
nRBC: 0 % (ref 0.0–0.2)

## 2021-09-17 LAB — SURGICAL PCR SCREEN
MRSA, PCR: NEGATIVE
Staphylococcus aureus: NEGATIVE

## 2021-09-17 LAB — IRON AND TIBC
Iron: 15 ug/dL — ABNORMAL LOW (ref 28–170)
Saturation Ratios: 7 % — ABNORMAL LOW (ref 10.4–31.8)
TIBC: 207 ug/dL — ABNORMAL LOW (ref 250–450)
UIBC: 192 ug/dL

## 2021-09-17 LAB — HEPATITIS B SURFACE ANTIGEN: Hepatitis B Surface Ag: NONREACTIVE

## 2021-09-17 LAB — GLUCOSE, CAPILLARY
Glucose-Capillary: 96 mg/dL (ref 70–99)
Glucose-Capillary: 121 mg/dL — ABNORMAL HIGH (ref 70–99)
Glucose-Capillary: 143 mg/dL — ABNORMAL HIGH (ref 70–99)
Glucose-Capillary: 169 mg/dL — ABNORMAL HIGH (ref 70–99)

## 2021-09-17 LAB — FOLATE: Folate: 28.6 ng/mL (ref >5.9)

## 2021-09-17 LAB — FERRITIN: Ferritin: 128 ng/mL (ref 11–307)

## 2021-09-17 SURGERY — OPEN REDUCTION INTERNAL FIXATION (ORIF) ANKLE FRACTURE
Anesthesia: General | Site: Ankle

## 2021-09-17 MED ORDER — FENTANYL CITRATE (PF) 100 MCG/2ML IJ SOLN: 25.0000 ug | INTRAMUSCULAR | Status: DC | PRN

## 2021-09-17 MED ORDER — HEPARIN SODIUM (PORCINE) 1000 UNIT/ML IJ SOLN
INTRAMUSCULAR | Status: AC
  Filled 2021-09-17: qty 1

## 2021-09-17 MED ORDER — SODIUM CHLORIDE 0.9 % IV SOLN
125.0000 mg | INTRAVENOUS | Status: DC
  Administered 2021-09-17: 125 mg via INTRAVENOUS
  Filled 2021-09-17 (×3): qty 10

## 2021-09-17 MED ORDER — HEPARIN 6000 UNIT IRRIGATION SOLUTION
Status: AC
  Filled 2021-09-17: qty 500

## 2021-09-17 MED ORDER — ROCURONIUM BROMIDE 10 MG/ML (PF) SYRINGE
PREFILLED_SYRINGE | INTRAVENOUS | Status: AC
  Filled 2021-09-17: qty 10

## 2021-09-17 MED ORDER — PROPOFOL 10 MG/ML IV BOLUS
INTRAVENOUS | Status: AC
  Filled 2021-09-17: qty 20

## 2021-09-17 MED ORDER — SODIUM CHLORIDE 0.9 % IV SOLN: INTRAVENOUS | Status: DC

## 2021-09-17 MED ORDER — DEXAMETHASONE SODIUM PHOSPHATE 10 MG/ML IJ SOLN
INTRAMUSCULAR | Status: AC
  Filled 2021-09-17: qty 1

## 2021-09-17 MED ORDER — ACETAMINOPHEN 500 MG PO TABS
ORAL_TABLET | ORAL | Status: AC
  Filled 2021-09-17: qty 2

## 2021-09-17 MED ORDER — DARBEPOETIN ALFA 40 MCG/0.4ML IJ SOSY
40.0000 ug | PREFILLED_SYRINGE | INTRAMUSCULAR | Status: DC
  Administered 2021-09-17: 40 ug via INTRAVENOUS
  Filled 2021-09-17: qty 0.4

## 2021-09-17 MED ORDER — HEPARIN 6000 UNIT IRRIGATION SOLUTION
Status: DC | PRN
  Administered 2021-09-17: 1

## 2021-09-17 MED ORDER — 0.9 % SODIUM CHLORIDE (POUR BTL) OPTIME
TOPICAL | Status: DC | PRN
  Administered 2021-09-17: 1000 mL

## 2021-09-17 MED ORDER — SUGAMMADEX SODIUM 200 MG/2ML IV SOLN
INTRAVENOUS | Status: DC | PRN
  Administered 2021-09-17: 200 mg via INTRAVENOUS

## 2021-09-17 MED ORDER — CEFAZOLIN SODIUM-DEXTROSE 1-4 GM/50ML-% IV SOLN
1.0000 g | Freq: Once | INTRAVENOUS | Status: DC
  Filled 2021-09-17: qty 50

## 2021-09-17 MED ORDER — MIDAZOLAM HCL 2 MG/2ML IJ SOLN
INTRAMUSCULAR | Status: DC | PRN
  Administered 2021-09-17: 2 mg via INTRAVENOUS

## 2021-09-17 MED ORDER — FENTANYL CITRATE (PF) 250 MCG/5ML IJ SOLN
INTRAMUSCULAR | Status: DC | PRN
  Administered 2021-09-17: 50 ug via INTRAVENOUS
  Administered 2021-09-17: 100 ug via INTRAVENOUS

## 2021-09-17 MED ORDER — INSULIN ASPART 100 UNIT/ML IJ SOLN: 0.0000 [IU] | INTRAMUSCULAR | Status: DC | PRN

## 2021-09-17 MED ORDER — HEPARIN SODIUM (PORCINE) 1000 UNIT/ML IJ SOLN
INTRAMUSCULAR | Status: DC | PRN
  Administered 2021-09-17: 3200 [IU] via INTRAVENOUS

## 2021-09-17 MED ORDER — LIDOCAINE HCL (PF) 1 % IJ SOLN
INTRAMUSCULAR | Status: AC
  Filled 2021-09-17: qty 30

## 2021-09-17 MED ORDER — ACETAMINOPHEN 500 MG PO TABS
500.0000 mg | ORAL_TABLET | Freq: Two times a day (BID) | ORAL | Status: DC
  Administered 2021-09-17 – 2021-09-25 (×14): 500 mg via ORAL
  Filled 2021-09-17 (×15): qty 1

## 2021-09-17 MED ORDER — METOPROLOL SUCCINATE ER 25 MG PO TB24
ORAL_TABLET | ORAL | Status: AC
  Administered 2021-09-17: 50 mg via ORAL
  Filled 2021-09-17: qty 2

## 2021-09-17 MED ORDER — CHLORHEXIDINE GLUCONATE 0.12 % MT SOLN: 15.0000 mL | Freq: Once | OROMUCOSAL | Status: DC

## 2021-09-17 MED ORDER — PHENYLEPHRINE 80 MCG/ML (10ML) SYRINGE FOR IV PUSH (FOR BLOOD PRESSURE SUPPORT)
PREFILLED_SYRINGE | INTRAVENOUS | Status: DC | PRN
  Administered 2021-09-17 (×2): 80 ug via INTRAVENOUS
  Administered 2021-09-17: 160 ug via INTRAVENOUS

## 2021-09-17 MED ORDER — MIDAZOLAM HCL 2 MG/2ML IJ SOLN
INTRAMUSCULAR | Status: AC
  Filled 2021-09-17: qty 2

## 2021-09-17 MED ORDER — CHLORHEXIDINE GLUCONATE 4 % EX LIQD: 60.0000 mL | Freq: Once | CUTANEOUS | Status: DC

## 2021-09-17 MED ORDER — ONDANSETRON HCL 4 MG/2ML IJ SOLN
INTRAMUSCULAR | Status: DC | PRN
  Administered 2021-09-17: 4 mg via INTRAVENOUS

## 2021-09-17 MED ORDER — ACETAMINOPHEN 500 MG PO TABS
1000.0000 mg | ORAL_TABLET | Freq: Once | ORAL | Status: AC
  Administered 2021-09-17: 1000 mg via ORAL

## 2021-09-17 MED ORDER — LIDOCAINE 2% (20 MG/ML) 5 ML SYRINGE
INTRAMUSCULAR | Status: AC
  Filled 2021-09-17: qty 5

## 2021-09-17 MED ORDER — POVIDONE-IODINE 10 % EX SWAB
2.0000 "application " | Freq: Once | CUTANEOUS | Status: AC
  Administered 2021-09-17: 2 via TOPICAL

## 2021-09-17 MED ORDER — ROCURONIUM BROMIDE 10 MG/ML (PF) SYRINGE
PREFILLED_SYRINGE | INTRAVENOUS | Status: DC | PRN
  Administered 2021-09-17: 80 mg via INTRAVENOUS

## 2021-09-17 MED ORDER — FENTANYL CITRATE (PF) 250 MCG/5ML IJ SOLN
INTRAMUSCULAR | Status: AC
  Filled 2021-09-17: qty 5

## 2021-09-17 MED ORDER — CEFAZOLIN SODIUM-DEXTROSE 2-4 GM/100ML-% IV SOLN
2.0000 g | INTRAVENOUS | Status: AC
  Administered 2021-09-17: 2 g via INTRAVENOUS
  Filled 2021-09-17: qty 100

## 2021-09-17 MED ORDER — ALBUMIN HUMAN 5 % IV SOLN: INTRAVENOUS | Status: DC | PRN

## 2021-09-17 MED ORDER — ORAL CARE MOUTH RINSE: 15.0000 mL | Freq: Once | OROMUCOSAL | Status: DC

## 2021-09-17 MED ORDER — CHLORHEXIDINE GLUCONATE 0.12 % MT SOLN
OROMUCOSAL | Status: AC
Start: 2021-09-17 — End: 2021-09-17
  Filled 2021-09-17: qty 15

## 2021-09-17 MED ORDER — MORPHINE SULFATE (PF) 2 MG/ML IV SOLN: 1.0000 mg | INTRAVENOUS | Status: DC | PRN

## 2021-09-17 MED ORDER — DEXAMETHASONE SODIUM PHOSPHATE 10 MG/ML IJ SOLN
INTRAMUSCULAR | Status: DC | PRN
  Administered 2021-09-17: 4 mg via INTRAVENOUS

## 2021-09-17 MED ORDER — ONDANSETRON HCL 4 MG/2ML IJ SOLN
INTRAMUSCULAR | Status: AC
  Filled 2021-09-17: qty 2

## 2021-09-17 MED ORDER — PROPOFOL 10 MG/ML IV BOLUS
INTRAVENOUS | Status: DC | PRN
  Administered 2021-09-17: 130 mg via INTRAVENOUS

## 2021-09-17 MED ORDER — PHENYLEPHRINE HCL-NACL 20-0.9 MG/250ML-% IV SOLN
INTRAVENOUS | Status: DC | PRN
  Administered 2021-09-17: 50 ug/min via INTRAVENOUS

## 2021-09-17 MED ORDER — HYDROCODONE-ACETAMINOPHEN 5-325 MG PO TABS: 1.0000 | ORAL_TABLET | Freq: Four times a day (QID) | ORAL | Status: DC | PRN

## 2021-09-17 MED ORDER — LIDOCAINE 2% (20 MG/ML) 5 ML SYRINGE
INTRAMUSCULAR | Status: DC | PRN
  Administered 2021-09-17: 60 mg via INTRAVENOUS

## 2021-09-17 SURGICAL SUPPLY — 105 items
ADH SKN CLS APL DERMABOND .7 (GAUZE/BANDAGES/DRESSINGS) ×2
BAG COUNTER SPONGE SURGICOUNT (BAG) ×6 IMPLANT
BAG DECANTER FOR FLEXI CONT (MISCELLANEOUS) ×3 IMPLANT
BAG SPNG CNTER NS LX DISP (BAG) ×4
BANDAGE ESMARK 6X9 LF (GAUZE/BANDAGES/DRESSINGS) ×2 IMPLANT
BIOPATCH RED 1 DISK 7.0 (GAUZE/BANDAGES/DRESSINGS) ×3 IMPLANT
BIT DRILL 2.0X130 SLD AO (BIT) ×1 IMPLANT
BIT DRILL 2.4X180 SLD AO (BIT) ×1 IMPLANT
BIT DRILL 2.9 CANN QC NONSTRL (BIT) ×1 IMPLANT
BNDG CMPR 9X6 STRL LF SNTH (GAUZE/BANDAGES/DRESSINGS) ×2
BNDG ELASTIC 4X5.8 VLCR STR LF (GAUZE/BANDAGES/DRESSINGS) IMPLANT
BNDG ELASTIC 6X5.8 VLCR STR LF (GAUZE/BANDAGES/DRESSINGS) IMPLANT
BNDG ESMARK 6X9 LF (GAUZE/BANDAGES/DRESSINGS) ×3
BNDG GAUZE ELAST 4 BULKY (GAUZE/BANDAGES/DRESSINGS) ×6 IMPLANT
BRUSH SCRUB EZ PLAIN DRY (MISCELLANEOUS) ×6 IMPLANT
CANISTER WOUND CARE 500ML ATS (WOUND CARE) ×1 IMPLANT
CATH PALINDROME-P 19CM W/VT (CATHETERS) ×1 IMPLANT
COVER MAYO STAND STRL (DRAPES) ×3 IMPLANT
COVER PROBE W GEL 5X96 (DRAPES) ×3 IMPLANT
COVER SURGICAL LIGHT HANDLE (MISCELLANEOUS) ×6 IMPLANT
CUFF TOURN SGL QUICK 34 (TOURNIQUET CUFF) ×3
CUFF TRNQT CYL 34X4.125X (TOURNIQUET CUFF) ×2 IMPLANT
DECANTER SPIKE VIAL GLASS SM (MISCELLANEOUS) ×3 IMPLANT
DERMABOND ADVANCED (GAUZE/BANDAGES/DRESSINGS) ×1
DERMABOND ADVANCED .7 DNX12 (GAUZE/BANDAGES/DRESSINGS) ×2 IMPLANT
DRAPE C-ARM 42X72 X-RAY (DRAPES) ×6 IMPLANT
DRAPE C-ARMOR (DRAPES) ×3 IMPLANT
DRAPE CHEST BREAST 15X10 FENES (DRAPES) ×3 IMPLANT
DRAPE HALF SHEET 40X57 (DRAPES) ×3 IMPLANT
DRAPE U-SHAPE 47X51 STRL (DRAPES) ×3 IMPLANT
DRSG EMULSION OIL 3X3 NADH (GAUZE/BANDAGES/DRESSINGS) IMPLANT
DRSG VAC ATS SM SENSATRAC (GAUZE/BANDAGES/DRESSINGS) ×1 IMPLANT
ELECT REM PT RETURN 9FT ADLT (ELECTROSURGICAL) ×3
ELECTRODE REM PT RTRN 9FT ADLT (ELECTROSURGICAL) ×2 IMPLANT
GAUZE 4X4 16PLY ~~LOC~~+RFID DBL (SPONGE) ×3 IMPLANT
GAUZE SPONGE 4X4 12PLY STRL (GAUZE/BANDAGES/DRESSINGS) ×6 IMPLANT
GLOVE BIO SURGEON STRL SZ7.5 (GLOVE) ×6 IMPLANT
GLOVE BIO SURGEON STRL SZ8 (GLOVE) ×3 IMPLANT
GLOVE BIOGEL PI IND STRL 7.5 (GLOVE) ×2 IMPLANT
GLOVE BIOGEL PI IND STRL 8 (GLOVE) ×4 IMPLANT
GLOVE BIOGEL PI INDICATOR 7.5 (GLOVE) ×1
GLOVE BIOGEL PI INDICATOR 8 (GLOVE) ×2
GLOVE SRG 8 PF TXTR STRL LF DI (GLOVE) ×2 IMPLANT
GLOVE SURG ORTHO LTX SZ7.5 (GLOVE) ×6 IMPLANT
GLOVE SURG POLYISO LF SZ8 (GLOVE) IMPLANT
GLOVE SURG UNDER POLY LF SZ8 (GLOVE) ×3
GOWN STRL REUS W/ TWL LRG LVL3 (GOWN DISPOSABLE) ×8 IMPLANT
GOWN STRL REUS W/ TWL XL LVL3 (GOWN DISPOSABLE) ×2 IMPLANT
GOWN STRL REUS W/TWL 2XL LVL3 (GOWN DISPOSABLE) ×3 IMPLANT
GOWN STRL REUS W/TWL LRG LVL3 (GOWN DISPOSABLE) ×12
GOWN STRL REUS W/TWL XL LVL3 (GOWN DISPOSABLE) ×3
K-WIRE ACE 1.6X6 (WIRE) ×6
K-WIRE SMOOTH TROCAR 2.0X150 (WIRE) ×6
K-WIRE SNGL END 1.2X150 (MISCELLANEOUS) ×6
KIT BASIN OR (CUSTOM PROCEDURE TRAY) ×6 IMPLANT
KIT TURNOVER KIT B (KITS) ×6 IMPLANT
KWIRE ACE 1.6X6 (WIRE) IMPLANT
KWIRE SMOOTH TROCAR 2.0X150 (WIRE) IMPLANT
KWIRE SNGL END 1.2X150 (MISCELLANEOUS) IMPLANT
MANIFOLD NEPTUNE II (INSTRUMENTS) ×3 IMPLANT
NDL 18GX1X1/2 (RX/OR ONLY) (NEEDLE) ×2 IMPLANT
NDL HYPO 21X1.5 SAFETY (NEEDLE) IMPLANT
NDL HYPO 25GX1X1/2 BEV (NEEDLE) ×2 IMPLANT
NEEDLE 18GX1X1/2 (RX/OR ONLY) (NEEDLE) ×3 IMPLANT
NEEDLE HYPO 21X1.5 SAFETY (NEEDLE) IMPLANT
NEEDLE HYPO 25GX1X1/2 BEV (NEEDLE) ×3 IMPLANT
NS IRRIG 1000ML POUR BTL (IV SOLUTION) ×6 IMPLANT
PACK GENERAL/GYN (CUSTOM PROCEDURE TRAY) ×3 IMPLANT
PACK ORTHO EXTREMITY (CUSTOM PROCEDURE TRAY) ×3 IMPLANT
PACK SURGICAL SETUP 50X90 (CUSTOM PROCEDURE TRAY) ×3 IMPLANT
PAD ARMBOARD 7.5X6 YLW CONV (MISCELLANEOUS) ×12 IMPLANT
PAD CAST 4YDX4 CTTN HI CHSV (CAST SUPPLIES) IMPLANT
PADDING CAST COTTON 4X4 STRL (CAST SUPPLIES)
PADDING CAST COTTON 6X4 STRL (CAST SUPPLIES) IMPLANT
PLATE FIBULAR CL 11H LT (Plate) ×1 IMPLANT
PREVENA RESTOR AXIOFORM 29X28 (GAUZE/BANDAGES/DRESSINGS) ×1 IMPLANT
SCREW 3.5X16 NONLOCKING (Screw) ×1 IMPLANT
SCREW LAG CANC STD HEAD 4.0 65 (Screw) ×1 IMPLANT
SCREW LOCK PLATE R3 2.7X15 (Screw) ×2 IMPLANT
SCREW LOCK PLATE R3 2.7X17 (Screw) ×2 IMPLANT
SCREW LOCK PLATE R3 2.7X20 (Screw) ×2 IMPLANT
SCREW LOCK PLATE R3 3.5X12 (Screw) ×1 IMPLANT
SCREW LOCK PLATE R3 3.5X14 (Screw) ×1 IMPLANT
SCREW NON LOCKING 3.5X12 (Screw) ×1 IMPLANT
SOAP 2 % CHG 4 OZ (WOUND CARE) ×3 IMPLANT
SPONGE T-LAP 18X18 ~~LOC~~+RFID (SPONGE) ×3 IMPLANT
STAPLER VISISTAT 35W (STAPLE) IMPLANT
SUCTION FRAZIER HANDLE 10FR (MISCELLANEOUS) ×1
SUCTION TUBE FRAZIER 10FR DISP (MISCELLANEOUS) ×2 IMPLANT
SUT ETHILON 2 0 FS 18 (SUTURE) ×8 IMPLANT
SUT ETHILON 3 0 PS 1 (SUTURE) ×3 IMPLANT
SUT MNCRL AB 4-0 PS2 18 (SUTURE) ×3 IMPLANT
SUT PDS AB 2-0 CT1 27 (SUTURE) IMPLANT
SUT VIC AB 2-0 CT1 (SUTURE) ×2 IMPLANT
SUT VIC AB 2-0 CT1 27 (SUTURE) ×6
SUT VIC AB 2-0 CT1 TAPERPNT 27 (SUTURE) ×4 IMPLANT
SYR 10ML LL (SYRINGE) ×3 IMPLANT
SYR 20ML LL LF (SYRINGE) ×6 IMPLANT
SYR 5ML LL (SYRINGE) ×3 IMPLANT
SYR CONTROL 10ML LL (SYRINGE) ×3 IMPLANT
TOWEL GREEN STERILE (TOWEL DISPOSABLE) ×9 IMPLANT
TOWEL GREEN STERILE FF (TOWEL DISPOSABLE) ×6 IMPLANT
TUBE CONNECTING 12X1/4 (SUCTIONS) ×3 IMPLANT
UNDERPAD 30X36 HEAVY ABSORB (UNDERPADS AND DIAPERS) ×3 IMPLANT
WATER STERILE IRR 1000ML POUR (IV SOLUTION) ×6 IMPLANT

## 2021-09-17 NOTE — Transfer of Care (Signed)
Immediate Anesthesia Transfer of Care Note ? ?Patient: Jessica Mercado ? ?Procedure(s) Performed: OPEN REDUCTION INTERNAL FIXATION (ORIF) ANKLE FRACTURE (Left: Ankle) ?INSERTION OF TUNNELED DIALYSIS CATHETER ? ?Patient Location: PACU ? ?Anesthesia Type:General ? ?Level of Consciousness: drowsy and patient cooperative ? ?Airway & Oxygen Therapy: Patient Spontanous Breathing and Patient connected to nasal cannula oxygen ? ?Post-op Assessment: Report given to RN and Post -op Vital signs reviewed and stable ? ?Post vital signs: Reviewed and stable ? ?Last Vitals:  ?Vitals Value Taken Time  ?BP 110/50 09/17/21 1342  ?Temp    ?Pulse    ?Resp 14 09/17/21 1342  ?SpO2    ?Vitals shown include unvalidated device data. ? ?Last Pain:  ?Vitals:  ? 09/17/21 1020  ?TempSrc:   ?PainSc: 0-No pain  ?   ? ?  ? ?Complications: No notable events documented. ?

## 2021-09-17 NOTE — Progress Notes (Signed)
Upper extremity vein mapping has been completed.  ? ?Preliminary results in CV Proc.  ? ?Jessica Mercado ?09/17/2021 10:00 AM    ?

## 2021-09-17 NOTE — Anesthesia Postprocedure Evaluation (Signed)
Anesthesia Post Note ? ?Patient: Jessica Mercado ? ?Procedure(s) Performed: OPEN REDUCTION INTERNAL FIXATION (ORIF) ANKLE FRACTURE (Left: Ankle) ?INSERTION OF TUNNELED DIALYSIS CATHETER ? ?  ? ?Patient location during evaluation: PACU ?Anesthesia Type: General ?Level of consciousness: awake and alert ?Pain management: pain level controlled ?Vital Signs Assessment: post-procedure vital signs reviewed and stable ?Respiratory status: spontaneous breathing, nonlabored ventilation, respiratory function stable and patient connected to nasal cannula oxygen ?Cardiovascular status: blood pressure returned to baseline and stable ?Postop Assessment: no apparent nausea or vomiting ?Anesthetic complications: no ? ? ?No notable events documented. ? ?Last Vitals:  ?Vitals:  ? 09/17/21 1415 09/17/21 1448  ?BP: 114/62 129/64  ?Pulse: 67 70  ?Resp: 11 12  ?Temp:  (!) 36.3 ?C  ?SpO2: 95% 91%  ?  ?Last Pain:  ?Vitals:  ? 09/17/21 1448  ?TempSrc: Oral  ?PainSc: 0-No pain  ? ? ?  ?  ?  ?  ?  ?  ? ?Manasi Dishon L Antania Hoefling ? ? ? ? ?

## 2021-09-17 NOTE — Progress Notes (Signed)
Case discussed with Dr Joelyn Oms today. Advised of pt's need for out-pt HD at d/c. Pt currently receiving first HD treatment. HD RN working with pt at time of navigator arrival. Will attempt to meet with pt tomorrow am. Will assist as needed.  ? ?Jessica Mercado ?Renal Navigator ?[phone] ?

## 2021-09-17 NOTE — Anesthesia Preprocedure Evaluation (Addendum)
Anesthesia Evaluation  ?Patient identified by MRN, date of birth, ID band ?Patient awake ? ? ? ?Reviewed: ?Allergy & Precautions, NPO status , Patient's Chart, lab work & pertinent test results, reviewed documented beta blocker date and time  ? ?Airway ?Mallampati: III ? ?TM Distance: >3 FB ?Neck ROM: Full ? ? ? Dental ? ?(+) Edentulous Upper, Edentulous Lower, Dental Advisory Given ?  ?Pulmonary ?sleep apnea ,  ?  ?Pulmonary exam normal ?breath sounds clear to auscultation ? ? ? ? ? ? Cardiovascular ?hypertension, Pt. on home beta blockers and Pt. on medications ?+CHF  ?Normal cardiovascular exam ?Rhythm:Regular Rate:Normal ? ?TTE 2023 ??1. Left ventricular ejection fraction, by estimation, is 45 to 50%. The  ?left ventricle has mildly decreased function. The left ventricle  ?demonstrates global hypokinesis. Left ventricular diastolic parameters are  ?consistent with Grade II diastolic  ?dysfunction (pseudonormalization).  ??2. Right ventricular systolic function is normal. The right ventricular  ?size is normal.  ??3. A small pericardial effusion is present. The pericardial effusion is  ?circumferential. There is no evidence of cardiac tamponade. Moderate  ?pleural effusion in the left lateral region.  ??4. The mitral valve is normal in structure. Mild mitral valve  ?regurgitation. No evidence of mitral stenosis.  ??5. The aortic valve is normal in structure. There is mild calcification  ?of the aortic valve. There is mild thickening of the aortic valve. Aortic  ?valve regurgitation is not visualized. Aortic valve sclerosis is present,  ?with no evidence of aortic valve  ?stenosis. Aortic valve Vmax measures 2.06 m/s.  ??6. The inferior vena cava is normal in size with greater than 50%  ?respiratory variability, suggesting right atrial pressure of 3 mmHg.  ?  ?Neuro/Psych ?negative neurological ROS ? negative psych ROS  ? GI/Hepatic ?negative GI ROS, Neg liver ROS,   ?Endo/Other   ?diabetes, Type 2, Oral Hypoglycemic Agents ? Renal/GU ?ESRFRenal diseaseLab Results ?     Component                Value               Date                 ?     CREATININE               7.78 (H)            09/17/2021           ?     BUN                      90 (H)              09/17/2021           ?     NA                       136                 09/17/2021           ?     K                        3.8                 09/17/2021           ?     CL  100                 09/17/2021           ?     CO2                      25                  09/17/2021           ?  ?negative genitourinary ?  ?Musculoskeletal ?negative musculoskeletal ROS ?(+)  ? Abdominal ?  ?Peds ? Hematology ? ?(+) Blood dyscrasia, anemia , Lab Results ?     Component                Value               Date                 ?     WBC                      10.4                09/17/2021           ?     HGB                      8.4 (L)             09/17/2021           ?     HCT                      25.4 (L)            09/17/2021           ?     MCV                      87.9                09/17/2021           ?     PLT                      269                 09/17/2021           ?   ?Anesthesia Other Findings ? ? Reproductive/Obstetrics ? ?  ? ? ? ? ? ? ? ? ? ? ? ? ? ?  ?  ? ? ? ? ? ? ? ?Anesthesia Physical ?Anesthesia Plan ? ?ASA: 3 ? ?Anesthesia Plan: General  ? ?Post-op Pain Management: Tylenol PO (pre-op)*  ? ?Induction: Intravenous ? ?PONV Risk Score and Plan: 3 and Midazolam, Dexamethasone and Ondansetron ? ?Airway Management Planned: Oral ETT ? ?Additional Equipment:  ? ?Intra-op Plan:  ? ?Post-operative Plan: Extubation in OR ? ?Informed Consent: I have reviewed the patients History and Physical, chart, labs and discussed the procedure including the risks, benefits and alternatives for the proposed anesthesia with the patient or authorized representative who has indicated his/her understanding and acceptance.  ? ?Patient  has DNR.  ?Discussed DNR with patient and Suspend DNR. ?  ?Dental advisory given ? ?Plan Discussed with: CRNA ? ?Anesthesia Plan Comments:   ? ? ? ? ? ?Anesthesia Quick Evaluation ? ?

## 2021-09-17 NOTE — Interval H&P Note (Signed)
History and Physical Interval Note: ? ?09/17/2021 ?10:09 AM ? ?Jessica Mercado  has presented today for surgery, with the diagnosis of Left Ankle Fracture.  The various methods of treatment have been discussed with the patient and family. After consideration of risks, benefits and other options for treatment, the patient has consented to  tunneled dialysis catheter as a surgical intervention.  The patient's history has been reviewed, patient examined, no change in status, stable for surgery.  I have reviewed the patient's chart and labs.  Questions were answered to the patient's satisfaction.   ? ? ?Servando Snare ? ? ?

## 2021-09-17 NOTE — Progress Notes (Signed)
FPTS Brief Progress Note ? ?S: Patient seen and assessed at bedside.  Patient reports having mild headache, abdominal pain, left ankle pain.  Patient reports that Tylenol does not help. Reports left ankle pain bothers her the most. Informed patient that she has pain meds as needed if she continues to experience severe pain.  Patient verbalized understanding and had no other questions at this time. ? ? ?O: ?BP 136/69 (BP Location: Left Arm)   Pulse 73   Temp 97.9 ?F (36.6 ?C) (Oral)   Resp 18   Ht 5\' 7"  (1.702 m)   Wt 201 lb 1 oz (91.2 kg)   LMP  (LMP Unknown)   SpO2 97%   BMI 31.49 kg/m?   ? ? ?A/P: ?-Defer to day team for plan. ?-Norco 5-325  every 6 hours as needed available for severe pain already available ?- Orders reviewed. Labs for AM ordered, which was adjusted as needed.  ?- If condition changes, plan includes bedside evaluation.  ? ?France Ravens, MD ?09/17/2021, 10:43 PM ?PGY-1, [place] Night Resident  ?Please page [phone] with questions.  ? ?

## 2021-09-17 NOTE — TOC CAGE-AID Note (Signed)
Transition of Care (TOC) - CAGE-AID Screening ? ? ?Patient Details  ?Name: Jessica Mercado ?MRN: [id number] ?Date of Birth: 10-Mar-1961 ? ?Transition of Care (TOC) CM/SW Contact:    ?Shaquina Gillham C Tarpley-Carter, LCSWA ?Phone Number: ?09/17/2021, 9:03 AM ? ? ?Clinical Narrative: ?Pt participated in [facility].  Pt stated she does not use substance or ETOH.  Pt was not offered resources, due to no usage of substance or ETOH.   ? ?Passenger transport manager, MSW, LCSW-A ?Pronouns:  She/Her/Hers ?[facility] HealthTransitions of Care ?Clinical Social Worker ?Direct Number:  [phone] ?[email or web address] ? ?CAGE-AID Screening: ?  ? ?Have You Ever Felt You Ought to Cut Down on Your Drinking or Drug Use?: No ?Have People Annoyed You By Critizing Your Drinking Or Drug Use?: No ?Have You Felt Bad Or Guilty About Your Drinking Or Drug Use?: No ?Have You Ever Had a Drink or Used Drugs First Thing In The Morning to Steady Your Nerves or to Get Rid of a Hangover?: No ?CAGE-AID Score: 0 ? ?Substance Abuse Education Offered: No ? ?  ? ? ? ? ? ? ?

## 2021-09-17 NOTE — Progress Notes (Signed)
Admit: 09/16/2021 ?LOS: 0 ? ?28F s/p fall and L ankle fracture with progressive CKD5 now uremic, ESRD ? ?Subjective:  ?Groggy but awakens to voice/touch ?She knows plan is for OR today to repair fracture, vascular access ?For HD #1 today ?CLIP in process ? ?No intake/output data recorded. ? ?Filed Weights  ? 09/16/21 1062 09/17/21 0642 09/17/21 1002  ?Weight: 90 kg 89.8 kg 89.8 kg  ? ? ?Scheduled Meds: ? acetaminophen      ? [MAR Hold] atorvastatin  40 mg Oral Daily  ? [MAR Hold] benztropine  1 mg Oral QPM  ? chlorhexidine  60 mL Topical Once  ? chlorhexidine  15 mL Mouth/Throat Once  ? Or  ? mouth rinse  15 mL Mouth Rinse Once  ? chlorhexidine      ? [MAR Hold] Chlorhexidine Gluconate Cloth  6 each Topical Q0600  ? [MAR Hold] FLUoxetine  60 mg Oral q morning  ? [MAR Hold] gabapentin  400 mg Oral BID  ? [MAR Hold] insulin aspart  0-6 Units Subcutaneous TID WC  ? [MAR Hold] lurasidone  40 mg Oral QHS  ? [MAR Hold] metoprolol succinate  50 mg Oral Daily  ? [MAR Hold] multivitamin with minerals  1 tablet Oral Daily  ? [MAR Hold] prazosin  1 mg Oral QPM  ? [MAR Hold] traZODone  300 mg Oral QHS  ? ?Continuous Infusions: ? [MAR Hold] sodium chloride    ? [MAR Hold] sodium chloride    ? sodium chloride 10 mL/hr at 09/17/21 1025  ? ?PRN Meds:.[MAR Hold] sodium chloride, [MAR Hold] sodium chloride, 0.9 % irrigation (POUR BTL), [MAR Hold] acetaminophen **OR** [MAR Hold] acetaminophen, heparin, insulin aspart ? ?Current Labs: reviewed  ? Latest Reference Range & Units 09/17/21 01:59  ?Hepatitis B Surface Ag NON REACTIVE  NON REACTIVE  ?Hep B S Ab NON REACTIVE  NON REACTIVE  ? ? ?Physical Exam:  Blood pressure (!) 144/76, pulse 69, temperature 97.7 ?F (36.5 ?C), temperature source Oral, resp. rate 17, height 5\' 7"  (1.702 m), weight 89.8 kg, SpO2 95 %. ?GEN: Chronically ill-appearing, conversant, slow, clearly not as cognitively sharp as the last time I saw her ?ENT: NCAT ?EYES: EOMI ?CV: Regular, no discernible rub ?PULM: Clear  bilaterally ?ABD: Soft, nontender ?SKIN: Left foot/ankle bandaged ?EXT: No significant edema ? 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 ?P ?TDC today with VVS in OR ?HD#1 thereafter ?CLIP in process ?Medication Issues; ?Preferred narcotic agents for pain control are hydromorphone, fentanyl, and methadone. Morphine should not be used.  ?Baclofen should be avoided ?Avoid oral sodium phosphate and magnesium citrate based laxatives / bowel preps  ? ? ?Pearson Grippe MD ?09/17/2021, 1:22 PM ? ?Recent Labs  ?Lab 09/16/21 ?0928 09/16/21 ?1306 09/17/21 ?0159  ?NA 136  --  136  ?K 4.3  --  3.8  ?CL 96*  --  100  ?CO2 24  --  25  ?GLUCOSE 182*  --  113*  ?BUN 86*  --  90*  ?CREATININE 8.06*  --  7.78*  ?CALCIUM 8.5*  --  8.4*  ?PHOS  --  5.0* 5.1*  ? ?Recent Labs  ?Lab 09/16/21 ?0928 09/17/21 ?0159  ?WBC 16.4* 10.4  ?NEUTROABS 14.0*  --   ?  HGB 10.4* 8.4*  ?HCT 31.8* 25.4*  ?MCV 89.8 87.9  ?PLT 277 269  ? ? ? ? ? ? ? ? ? ?  ?

## 2021-09-17 NOTE — Op Note (Signed)
? ? ?  Patient name: CHANNING SAVICH MRN: [id number] DOB: Oct 30, 1960 Sex: female ? ?09/17/2021 ?Pre-operative Diagnosis: End-stage renal disease ?Post-operative diagnosis:  Same ?Surgeon:  Eda Paschal. Donzetta Matters, MD ?Procedure Performed:  Ultrasound fluoroscopic guided placement of right IJ 19 cm tunneled dialysis catheter ? 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  We have been asked to place tunneled dialysis catheter this was discussed with the patient she demonstrates good understanding. ? ?Findings: The right IJ was noted be patent and compressible.  At completion the catheter terminates at the [facility] atrial junction and was locked with 1.6 cc of concentrated heparin in both ports. ?  ?Procedure:  The patient was asleep on the operating table having undergone ORIF of left ankle.  Upon entering the room the patient was prepped and draped in the neck and chest in usual fashion and a new timeout was called.  Antibiotics were up-to-date.  Ultrasound was used to identify the right IJ which was noted be patent and compressible.  The vein was cannulated with 18-gauge needle and a wire was passed centrally.  A 19 cm catheter was tunneled from a counterincision.  The wire tract was serially dilated and introducer sheath was placed under fluoroscopic guidance.  Catheter was placed to the SVC atrial junction under fluoroscopic guidance affixed the skin with 3-0 nylon suture.  Neck incision closed with 4-0 Monocryl.  Dermabond was placed at both sites.  The catheter was initially flushed heparinized saline and locked with 1.6 cc of concentrated heparin in either port.  Sterile dressing was applied.  Patient was awakened from anesthesia having tolerated procedure without any complication.  All counts were correct at completion. ? ?EBL: 10 cc ? ? ?Kade Rickels C. Donzetta Matters, MD ?[facility] ?Office: [phone] ?Pager:  [phone] ? ? ?

## 2021-09-17 NOTE — Progress Notes (Addendum)
NEW ADMISSION NOTE ?New Admission Note:  ? ?Arrival Method:  Stretcher via ER ?Mental Orientation: Alert and oriented x4 ?Telemetry: MX10 ?Assessment: To be Completed ?Skin: Intact ?IV: Right AC ?Pain: None ?Tubes: None ?Safety Measures: Safety Fall Prevention Plan has been given, discussed and signed ?Admission: Completed ?[facility] [place] Orientation: Patient has been orientated to the room, unit and staff.  ?Family: Not present at bedside ? ?Orders have been reviewed and implemented. Will continue to monitor the patient. Call light has been placed within reach and bed alarm has been activated.  ? ?Sharmon Revere, RN   ?

## 2021-09-17 NOTE — Anesthesia Procedure Notes (Signed)
Procedure Name: Intubation ?Date/Time: 09/17/2021 11:17 AM ?Performed by: Lance Coon, CRNA ?Pre-anesthesia Checklist: Emergency Drugs available, Patient identified, Suction available, Patient being monitored and Timeout performed ?Patient Re-evaluated:Patient Re-evaluated prior to induction ?Oxygen Delivery Method: Circle system utilized ?Preoxygenation: Pre-oxygenation with 100% oxygen ?Induction Type: IV induction ?Ventilation: Mask ventilation without difficulty ?Laryngoscope Size: Sabra Heck and 3 ?Grade View: Grade I ?Tube type: Oral ?Tube size: 7.0 mm ?Number of attempts: 1 ?Airway Equipment and Method: Stylet ?Placement Confirmation: ETT inserted through vocal cords under direct vision, positive ETCO2 and breath sounds checked- equal and bilateral ?Secured at: 21 cm ?Tube secured with: Tape ?Dental Injury: Teeth and Oropharynx as per pre-operative assessment  ? ? ? ? ?

## 2021-09-17 NOTE — Progress Notes (Addendum)
Family Medicine Teaching Service ?Daily Progress Note ?Intern Pager: [phone] ? ?Patient name: Jessica Mercado Medical record number: [id number] ?Date of birth: 08-31-1960 Age: 61 y.o. Gender: female ? ?Primary Care Provider: Vassie Moment, MD ?Consultants: Nephrology, Orthopedic Surgery, Vascular Surgery ?Code Status: DNR ? ?Pt Overview and Major Events to Date:  ?4/24- admitted ?4/25- planned for OR with ortho and VVS for fixation of trimalleolar fracture and placement of TDC ? ?Assessment and Plan: ?[name] is a 61 y.o. female presenting with left ankle pain after a fall at home, found to have trimalleolar fracture. Here for surgical fixation and initiation of HD for uremia. PMH is significant for CKD V not on HD, DM2, Bipolar Disorder, HFmrEF, HTN, and anemia.  ? ?Fall  Trimalleolar Fracture ?Patient scheduled for ORIF with ortho this morning. Workup for recurrent falls/increasing confusion pointing to uremia secondary to renal failure as the leading item on the differential. Ammonia and TSH reassuringly WNL.  ?- OR with orthopedic surgery for ORIF at 1120 ?- Pain control per surgery, favor synthetic opioids given renal function ? ?CKD V now ESRD ?Cr 7.78 this am. BUN 86>90.  K reassuringly 3.8 this am. Nephrology yesterday with recommendation to initiate HD. VVS consulted for [facility] placement, will do while patient is in the OR and under general anesthesia for ORIF. ?- TDC placement per VVS ?- HD per nephro, hopefully later today ?- Vein mapping ordered for AVF planning ?  ?Suprapubic Pain ?UA equivocal for UTI, with moderate leukocytes and  many bacteria, but negative nitrates and only 6-10 WBC. Also 6-10 squamous epithelial cells so unlikely to be a true clean catch. Culture is pending. Reassuringly afebrile and without leukocytosis this am.  ?- Follow-up culture ? ?Anemia ?Hgb 10.4>8.4, no evidence of active bleeding. Iron 15, TIBC low at 207, Sat ration 7, but ferritin WNL at 128, suggestive of anemia of  chronic disease.  ?- Will need to monitor closely post-surgically ? ?Elevated B12  ?B12 markedly elevated to 6,354. Elevated cobalamin can be associated with renal disease, but this degree of elevation is surprising. Can be associated with hepatocellular carcinoma, but or cirrhosis but normal appearance of liver on CT Abd/Pelv. Cell lines normal, so no evidence of blood cancers. Patient denies taking any supplements, but also unclear how well she knows her own medication regimen especially given her acute confusion/uremia. Most likely cause would be oversupplementation. ?- Recheck B12 in a few days ?- Peripheral smear with next blood draw ? ?Chronic, stable conditions ?Bipolar Disorder ?Parkinsonism vs EPS  ?-Continue Latuda and Prozac ?- Continue home benztropine 1mg  QHS ?Hyperlipidemia ?- Continue home atorvastatin ?Insomnia ?- Continue home Trazodone  ? ?FEN/GI: NPO for surgery, resume renal diet after surgery ?PPx: Heparin, being held ahead of surgery ?Dispo:Pending PT recommendations  pending clinical improvement . Barriers include surgery, starting HD.  ? ?Subjective:  ?Ms Borys was a bit sleepy this morning but woke up and answered questions appropriately, no acute/new concerns this morning.  ? ?Objective: ?Temp:  [98.3 ?F (36.8 ?C)-98.5 ?F (36.9 ?C)] 98.5 ?F (36.9 ?C) (04/25 0048) ?Pulse Rate:  [71-81] 71 (04/25 0048) ?Resp:  [10-26] 11 (04/25 0048) ?BP: (90-176)/(54-155) 143/79 (04/25 0048) ?SpO2:  [93 %-100 %] 93 % (04/25 0048) ?Weight:  [90 kg] 90 kg (04/24 0653) ?Physical Exam: ?General: Sleepy but appropriate when awakens ?Cardiovascular: Regular rate and rhythm, 2/6 systolic murmur ?Respiratory: Normal WOB on RA, lungs clear anteriorly ?Abdomen: Soft, non-tender, non-distended--no suprapubic tenderness today, an improvement from previous ?Extremities: LLE  with splint in place, can wiggle toes ? ?Laboratory: ?Recent Labs  ?Lab 09/16/21 ?0928 09/17/21 ?0159  ?WBC 16.4* 10.4  ?HGB 10.4* 8.4*  ?HCT 31.8*  25.4*  ?PLT 277 269  ? ?Recent Labs  ?Lab 09/16/21 ?0928 09/16/21 ?1306 09/17/21 ?0159  ?NA 136  --  136  ?K 4.3  --  3.8  ?CL 96*  --  100  ?CO2 24  --  25  ?BUN 86*  --  90*  ?CREATININE 8.06*  --  7.78*  ?CALCIUM 8.5*  --  8.4*  ?PROT  --  6.4*  --   ?BILITOT  --  1.2  --   ?ALKPHOS  --  75  --   ?ALT  --  22  --   ?AST  --  23  --   ?GLUCOSE 182*  --  113*  ? ? ?Imaging/Diagnostic Tests: ?CT Ankle Left Wo Contrast ?CLINICAL DATA:  Ankle trauma, fracture, xray done (Age >= 5y) ? ?EXAM: ?CT OF THE LEFT ANKLE WITHOUT CONTRAST ? ?TECHNIQUE: ?Multidetector CT imaging of the left ankle was performed according ?to the standard protocol. Multiplanar CT image reconstructions were ?also generated. ? ?RADIATION DOSE REDUCTION: This exam was performed according to the ?departmental dose-optimization program which includes automated ?exposure control, adjustment of the mA and/or kV according to ?patient size and/or use of iterative reconstruction technique. ? ?COMPARISON:  There is a radiograph ? ?FINDINGS: ?Bones/Joint/Cartilage ? ?There is a displaced trimalleolar ankle fracture. Medial malleolar ?fracture at the base of the malleolus is angulated with up to 6 mm ?displacement and with significant comminution. Comminuted lateral ?malleolar fracture demonstrates mild posterolateral displacement and ?angulation, degree of displacement up to 6 mm. Posterior malleolar ?fracture is displaced up to 1.1 cm and involves approximately 25% of ?the articular surface. Additionally, there is a fracture of the ?anteromedial tibial plafond involving approximately 20% of the ?articular surface. ? ?Prominent os navicularis. There is mild talonavicular ?osteoarthritis. Os trigonum. ? ?Ligaments ? ?Suboptimally assessed by CT. ? ?Muscles and Tendons ? ?There is thickening and ossification along the mid Achilles ?consistent with chronic Achilles tendinosis. The posterior tibial ?tendon abuts the medial malleolar fracture fragments but there  is no ?definite tendon entrapment. ? ?Soft tissues ? ?There is significant ankle soft tissue swelling. ? ?IMPRESSION: ?Displaced and comminuted trimalleolar fracture as described above, ?with posterior malleolar and anteromedial tibial plafond fractures ?involving a significant degree of the distal tibia articular ?surface. ? ?The posterior tibial tendon abuts medial malleolar fracture ?fragments, but there is no definite tendon entrapment. ? ?Findings of chronic mid Achilles tendinosis. ? ?Electronically Signed ?  By: Maurine Simmering M.D. ?  On: 09/16/2021 11:06 ?DG Ankle Left Port ?CLINICAL DATA:  Post reduction. ? ?EXAM: ?PORTABLE LEFT ANKLE - 2 VIEW ? ?COMPARISON:  Earlier same day. ? ?FINDINGS: ?Two views study shows fine bony detail obscured by overlying ?fiberglass. Trimalleolar fracture again noted with interval ?improvement in bony alignment status post closed reduction. ? ?IMPRESSION: ?Interval improvement in alignment status post reduction. ? ?Electronically Signed ?  By: Misty Stanley M.D. ?  On: 09/16/2021 09:02 ?DG Ankle Complete Left ?CLINICAL DATA:  Ankle pain after fall ? ?EXAM: ?LEFT ANKLE COMPLETE - 3+ VIEW ? ?COMPARISON:  None. ? ?FINDINGS: ?Oblique distal fibular fracture, at and above the ankle joint. More ?horizontal medial malleolus fracture. Posterior malleolus fracture ?with posterior displacement. The ankle is lateral and posteriorly ?subluxed. ? ?IMPRESSION: ?Displaced trimalleolar ankle fractures with subluxed ankle joint. ? ?Electronically Signed ?  By: Jorje Guild M.D. ?  On: 09/16/2021 07:33 ? ? ? ?Eppie Gibson, MD ?09/17/2021, 6:27 AM ?PGY-1, [facility] ?[place] Intern pager: [phone], text pages welcome ? ? ?

## 2021-09-18 ENCOUNTER — Encounter (HOSPITAL_COMMUNITY): Payer: Self-pay | Admitting: Orthopedic Surgery

## 2021-09-18 DIAGNOSIS — N19 Unspecified kidney failure: Secondary | ICD-10-CM | POA: Diagnosis not present

## 2021-09-18 DIAGNOSIS — N179 Acute kidney failure, unspecified: Secondary | ICD-10-CM | POA: Diagnosis not present

## 2021-09-18 DIAGNOSIS — N186 End stage renal disease: Secondary | ICD-10-CM | POA: Diagnosis not present

## 2021-09-18 LAB — RENAL FUNCTION PANEL
Albumin: 2.6 g/dL — ABNORMAL LOW (ref 3.5–5.0)
Anion gap: 11 (ref 5–15)
BUN: 63 mg/dL — ABNORMAL HIGH (ref 6–20)
CO2: 27 mmol/L (ref 22–32)
Calcium: 8.2 mg/dL — ABNORMAL LOW (ref 8.9–10.3)
Chloride: 99 mmol/L (ref 98–111)
Creatinine, Ser: 5.55 mg/dL — ABNORMAL HIGH (ref 0.44–1.00)
GFR, Estimated: 8 mL/min — ABNORMAL LOW (ref >60)
Glucose, Bld: 168 mg/dL — ABNORMAL HIGH (ref 70–99)
Phosphorus: 5.1 mg/dL — ABNORMAL HIGH (ref 2.5–4.6)
Potassium: 4.3 mmol/L (ref 3.5–5.1)
Sodium: 137 mmol/L (ref 135–145)

## 2021-09-18 LAB — URINE CULTURE: Culture: >=100000 — AB

## 2021-09-18 LAB — CBC
HCT: 25.5 % — ABNORMAL LOW (ref 36.0–46.0)
Hemoglobin: 8.5 g/dL — ABNORMAL LOW (ref 12.0–15.0)
MCH: 29.3 pg (ref 26.0–34.0)
MCHC: 33.3 g/dL (ref 30.0–36.0)
MCV: 87.9 fL (ref 80.0–100.0)
Platelets: 238 10*3/uL (ref 150–400)
RBC: 2.9 MIL/uL — ABNORMAL LOW (ref 3.87–5.11)
RDW: 13.1 % (ref 11.5–15.5)
WBC: 9.9 10*3/uL (ref 4.0–10.5)
nRBC: 0 % (ref 0.0–0.2)

## 2021-09-18 LAB — GLUCOSE, CAPILLARY
Glucose-Capillary: 114 mg/dL — ABNORMAL HIGH (ref 70–99)
Glucose-Capillary: 122 mg/dL — ABNORMAL HIGH (ref 70–99)
Glucose-Capillary: 157 mg/dL — ABNORMAL HIGH (ref 70–99)
Glucose-Capillary: 184 mg/dL — ABNORMAL HIGH (ref 70–99)
Glucose-Capillary: 341 mg/dL — ABNORMAL HIGH (ref 70–99)

## 2021-09-18 LAB — HEPATITIS B SURFACE ANTIBODY, QUANTITATIVE: Hep B S AB Quant (Post): <3.1 m[IU]/mL — ABNORMAL LOW (ref >9.9)

## 2021-09-18 LAB — SAVE SMEAR(SSMR), FOR PROVIDER SLIDE REVIEW

## 2021-09-18 LAB — VITAMIN D 25 HYDROXY (VIT D DEFICIENCY, FRACTURES): Vit D, 25-Hydroxy: 93.97 ng/mL (ref 30–100)

## 2021-09-18 MED ORDER — INSULIN ASPART 100 UNIT/ML IJ SOLN
2.0000 [IU] | Freq: Once | INTRAMUSCULAR | Status: AC
  Administered 2021-09-18: 2 [IU] via SUBCUTANEOUS

## 2021-09-18 MED ORDER — HEPARIN SODIUM (PORCINE) 5000 UNIT/ML IJ SOLN
5000.0000 [IU] | Freq: Three times a day (TID) | INTRAMUSCULAR | Status: DC
  Administered 2021-09-18 – 2021-09-25 (×20): 5000 [IU] via SUBCUTANEOUS
  Filled 2021-09-18 (×19): qty 1

## 2021-09-18 MED ORDER — CEFAZOLIN SODIUM-DEXTROSE 1-4 GM/50ML-% IV SOLN
1.0000 g | Freq: Every day | INTRAVENOUS | Status: AC
  Administered 2021-09-18 – 2021-09-19 (×2): 1 g via INTRAVENOUS
  Filled 2021-09-18 (×3): qty 50

## 2021-09-18 MED ORDER — GABAPENTIN 100 MG PO CAPS
100.0000 mg | ORAL_CAPSULE | Freq: Every day | ORAL | Status: DC
  Administered 2021-09-19 – 2021-09-25 (×7): 100 mg via ORAL
  Filled 2021-09-18 (×7): qty 1

## 2021-09-18 MED ORDER — HEPARIN SODIUM (PORCINE) 1000 UNIT/ML IJ SOLN
INTRAMUSCULAR | Status: AC
  Filled 2021-09-18: qty 4

## 2021-09-18 NOTE — TOC Initial Note (Signed)
Transition of Care (TOC) - Initial/Assessment Note  ? ? ?Patient Details  ?Name: Jessica Mercado ?MRN: [id number] ?Date of Birth: May 15, 1961 ? ?Transition of Care (TOC) CM/SW Contact:    ?Tom-Johnson, Renea Ee, RN ?Phone Number: ?09/18/2021, 2:50 PM ? ?Clinical Narrative:                 ? ?CM spoke with patient at bedside about needs for post hospital transition. Admitted for ESRD. Had a fall at home and sustained Lt Ankle Fx. ORIF done on 04/25. Patient also had Vein mapping and Tunneled dialysis cath placement yesterday 04/25. Renal Navigator to clip for outpatient dialysis. Patient currently uses [facility] to and from her appointments. CM will notify them of new dialysis schedule once patient is clipped.  ?Patient lives at home with her son in-law [name]. States she had a previous fusion to her Rt ankle. Currently on disability. Has a walker, cane and shower seat at home.  ?PCP is Vassie Moment, MD and [facility] [address].  ?CM awaiting PT/OT eval for disposition. Will continue to follow with needs.  ? ?  ?Barriers to Discharge: Continued Medical Work up ? ? ?Patient Goals and CMS Choice ?Patient states their goals for this hospitalization and ongoing recovery are:: To return home ?CMS Medicare.gov Compare Post Acute Care list provided to:: Patient ?Choice offered to / list presented to : Patient ? ?Expected Discharge Plan and Services ?  ?  ?Discharge Planning Services: CM Consult ?  ?Living arrangements for the past 2 months: [place] ?                ?  ?  ?  ?  ?  ?  ?  ?  ?  ?  ? ?Prior Living Arrangements/Services ?Living arrangements for the past 2 months: [place] ?Lives with:: Relatives (Son in-law, [name]) ?Patient language and need for interpreter reviewed:: Yes ?Do you feel safe going back to the place where you live?: Yes      ?Need for Family Participation in Patient Care: Yes (Comment) ?Care giver support system in place?: Yes (comment) ?Current home  services: DME (Walker, cane, shower seat.) ?Criminal Activity/Legal Involvement Pertinent to Current Situation/Hospitalization: No - Comment as needed ? ?Activities of Daily Living ?Home Assistive Devices/Equipment: Gilford Rile (specify type) ?ADL Screening (condition at time of admission) ?Patient's cognitive ability adequate to safely complete daily activities?: No ?Is the patient deaf or have difficulty hearing?: No ?Does the patient have difficulty seeing, even when wearing glasses/contacts?: Yes ?Does the patient have difficulty concentrating, remembering, or making decisions?: Yes ?Patient able to express need for assistance with ADLs?: Yes ?Does the patient have difficulty dressing or bathing?: No ?Independently performs ADLs?: No ?Does the patient have difficulty walking or climbing stairs?: Yes ?Weakness of Legs: Both ?Weakness of Arms/Hands: Both ? ?Permission Sought/Granted ?Permission sought to share information with : Case Manager, Family Supports ?Permission granted to share information with : Yes, Verbal Permission Granted ?   ?   ?   ?   ? ?Emotional Assessment ?Appearance:: Appears stated age ?Attitude/Demeanor/Rapport: Engaged, Gracious ?Affect (typically observed): Accepting, Appropriate, Calm, Hopeful ?Orientation: : Oriented to Self, Oriented to Place, Oriented to  Time, Oriented to Situation ?Alcohol / Substance Use: Not Applicable ?Psych Involvement: No (comment) ? ?Admission diagnosis:  ESRD (end stage renal disease) ([place]) [N18.6] ?Acute renal failure (ARF) ([facility]) [N17.9] ?Closed trimalleolar fracture of left ankle, initial encounter [S82.852A] ?Patient Active Problem List  ? Diagnosis Date  Noted  ? Acute renal failure (ARF) ([place]) 09/17/2021  ? Gait instability 09/16/2021  ? Diabetic retinopathy with macular edema ([place]) 09/16/2021  ? Irritable bowel syndrome 09/16/2021  ? Obstructive sleep apnea 09/16/2021  ? Restless legs syndrome (RLS) 09/16/2021  ? ESRD (end stage renal disease) ([place]) 09/16/2021   ? Uremia 09/16/2021  ? Falls 09/16/2021  ? Syncope 09/16/2021  ? Closed trimalleolar fracture of ankle, left, initial encounter 09/16/2021  ? Heart failure, systolic and diastolic ([place]) 42/70/6237  ? Diabetes mellitus type 2 with complications ([place]) 62/83/1517  ? Anemia associated with chronic renal failure 07/18/2021  ? Hyperlipidemia 07/18/2021  ? Obesity 07/18/2021  ? Hypertension associated with chronic kidney disease due to type 2 diabetes mellitus ([place]) 03/30/2020  ? Hyponatremia 03/30/2020  ? ?PCP:  Vassie Moment, MD ?Pharmacy:   ?[id number] [place], [place] ?[place] ?[place] [address] ?Phone: [phone] Fax: [phone] ? ? ? ? ?Social Determinants of Health (SDOH) Interventions ?  ? ?Readmission Risk Interventions ? ?  07/20/2021  ?  4:39 PM  ?Readmission Risk Prevention Plan  ?Transportation Screening Complete  ?[place] or Home Care Consult Complete  ?Social Work Consult for [place] Planning/Counseling Complete  ?Palliative Care Screening Not Applicable  ?Medication Review Press photographer) Complete  ? ? ? ?

## 2021-09-18 NOTE — Progress Notes (Signed)
Requested to see pt for out-pt HD needs. Met with pt at bedside to introduce self and explain role. Pt is from home with son-in-law. Pt prefers a clinic close to her home. Pt resides in [place] therefore will make referral to [facility] admissions. Referral made this morning. Pt reports that she uses transportation services through her managed medicaid to/from appts and will need to use that transportation to/from HD appts. Spoke to RN CM regarding pt's need for transportation to/from HD. Will notify RN CM of pt's schedule once known so arrangements can be made. Will assist as needed.  ? ?Melven Sartorius ?Renal Navigator ?[phone] ?

## 2021-09-18 NOTE — Progress Notes (Signed)
OT Cancellation Note ? ?Patient Details ?Name: Jessica Mercado ?MRN: [id number] ?DOB: 1961/04/30 ? ? ?Cancelled Treatment:     Pt. Was leaving for HD when evaluation was attempted. Will move to Thursday.  ? ?[name] ?09/18/2021, 11:40 AM ?

## 2021-09-18 NOTE — Op Note (Signed)
09/17/2021 ? ?7:11 PM ? ?PATIENT:  Jessica Mercado  February 23, 1961 female  ? ?MEDICAL RECORD NUMBER: [id number] ? ?PRE-OPERATIVE DIAGNOSIS: ?LEFT ANKLE TRIMALLEOLAR FRACTURE ?LEFT ANKLE POSTEROLATERAL DISLOCATION ? ?POST-OPERATIVE DIAGNOSIS:   ?LEFT ANKLE TRIMALLEOLAR FRACTURE ?LEFT ANKLE POSTEROLATERAL DISLOCATION ? ?PROCEDURE:   ?OPEN REDUCTION INTERNAL FIXATION OF LEFT TRIMALLEOLAR ANKLE FRACTURE WITHOUT FIXATION OF THE POSTERIOR LIP ?OPEN TREATMENT OF ANKLE DISLOCATION WITH PINNING ?RETENTION SUTURE CLOSURE 8 CM ? ?SURGEON:  Astrid Divine. Marcelino Scot, M.D. ? ?ASSISTANT:  Ainsley Spinner, PA-C. ? ?ANESTHESIA:  General. ? ?COMPLICATIONS:  None. ? ?TOURNIQUET: None. ? ?ESTIMATED BLOOD LOSS:  50 mL. ? ?DISPOSITION:  To PACU. ? ?CONDITION:  Stable. ? ?DELAY START OF DVT PROPHYLAXIS BECAUSE OF BLEEDING RISK: NO ? ? ?BRIEF SUMMARY AND INDICATIONS FOR PROCEDURE:  The patient is a 61 y.o. who sustained a fracture dislocation of the ankle, treated with splinting at the time of presentation to the Emergency Department. I discussed with the patient the risks and benefits of surgery including the possibility of infection, DVT, PE, nerve injury, vessel injury, loss of motion, arthritis, symptomatic hardware, heart attack, stroke and need for further surgery, among others. We specifically discussed syndesmotic repair if needed and the likelihood of ankle joint pinning for stabilization of her dislocation. After acknowledging these risks, consent was provided to proceed. ? ?SUMMARY OF PROCEDURE:  The patient was taken to the operating room after administration of a regional block and preoperative antibiotics.  The left lower extremity was prepped and draped in the usual sterile fashion. A timeout was held, and then an incision was made directly over the lateral malleolus with careful dissection to avoid injury to the superficial peroneal nerve.  The periosteum was left intact as we continued deep dissection.  The fracture site was identified and  curettage and lavage used to remove hematoma.  With the assistance of distal manipulation and placement of tenaculums, we were able to obtain an anatomic reduction with  ?interdigitation of the primary fracture fragments.  Because of the angle and comminution of the fracture site, I was unable to place lag screws but was able to hold this interdigitated during application of a lateral malleolus plate from the posterolateral side.  We used the [employer] system and placed standard fixation in the shaft and distal lateral malleolus, confirming plate position with x-ray and then continuing with a standard fixation as well as a locked fixation.  Final images showed appropriate reductional replacement, trajectory, and length. ? ?Attention was turned to the ankle dislocation. Using access through the open incision a reduction of the tibiotalar joint was fine tuned on the lateral and AP/ mortise projections. This was held securely while two 2.0 mm k wires were run through the calcaneous across the talus and into the tibia to stabilize the reduction. The wires were bent at 90 degrees 1 cm below the heel pad. ? ?Next, attention was turned to the medial side.  Here, the comminution warranted a percutaneous approach. I carefully placed a K wire across the fracture site from the tip of the medial malleolus and secured fixation proximally in the shaft cortex with a 65 mm screw from [employer] (hex drive head). Excellent compression was obtained.  The C-arm was then brought back in and AP, lateral and mortise views showed restoration of ankle alignment reduction.   ? ?The posterior malleolar fracture fragment was then evaluated to gauge whether it should be fixed if it constituted a significant portion of the articular surface. Based on this it did  not require fixation.  ? ?Wounds were irrigated thoroughly and layered closure performed. However this was not standard. Instead, because of her underlying medical problems, namely diabetes,  ESRD, cardiomyopathy, and obesity, her traumatized soft tissues required special care and treatment with a retention suture over the 8 cm wound. Sterile softly compressive dressing and posterior and stirrup splint was then applied. ? ?PROGNOSIS: ?The patient will be nonweightbearing in the splint with ice and elevation over the next 3 to 5 days.  We will plan to see her back in the office in 10-14 days for removal of sutures and transition to a cast with removal of the pins at 4-6 wks with unrestricted range of motion of the ankle at that time.  Protected weightbearing at 6 weeks.  ?

## 2021-09-18 NOTE — Progress Notes (Signed)
Patient's CBG=341. MD,Ji notified. Order received. Will continue to monitor. ?

## 2021-09-18 NOTE — Progress Notes (Signed)
FPTS Brief Note ?Reviewed patient's vitals, recent notes.  ?Vitals:  ? 09/18/21 1430 09/18/21 1501  ?BP: 94/71 (!) 124/49  ?Pulse:  67  ?Resp: 18 18  ?Temp: 98 ?F (36.7 ?C) 98.1 ?F (36.7 ?C)  ?SpO2: 97% 97%  ? ?At this time, no change in plan from day progress note.  ?France Ravens, MD ?Page [phone] with questions about this patient.  ? ?

## 2021-09-18 NOTE — Progress Notes (Signed)
? ?                              Orthopaedic Trauma Service Progress Note ? ?Patient ID: ?Jessica Mercado ?MRN: [id number] ?DOB/AGE: 09-16-1960 61 y.o. ? ?Subjective: ? ?L ankle feels ok  ?Pt has only had tylenol. Has not used any norco since surgery  ?Uses walker at baseline ?History of R ankle infection after ORIF about 7 years ago in [place]  ? ? ?ROS ?As above ? ?Objective:  ? ?VITALS:   ?Vitals:  ? 09/17/21 1750 09/17/21 1829 09/17/21 2009 09/18/21 0452  ?BP: 120/61 (!) 129/59 136/69 131/79  ?Pulse:  70 73 70  ?Resp:  14 18 18   ?Temp: 97.6 ?F (36.4 ?C) 97.7 ?F (36.5 ?C) 97.9 ?F (36.6 ?C) (!) 97.4 ?F (36.3 ?C)  ?TempSrc:  Oral Oral Oral  ?SpO2:   97% 91%  ?Weight: 91.2 kg     ?Height:      ? ? ?Estimated body mass index is 31.49 kg/m? as calculated from the following: ?  Height as of this encounter: 5\' 7"  (1.702 m). ?  Weight as of this encounter: 91.2 kg. ? ? ?Intake/Output   ?   04/25 0701 ?04/26 0700 04/26 0701 ?04/27 0700  ? P.O. 360   ? I.V. (mL/kg) 900 (9.9)   ? IV Piggyback 350   ? Total Intake(mL/kg) 1610 (17.7)   ? Urine (mL/kg/hr) 0 (0)   ? Drains 0   ? Other -102   ? Stool 0   ? Blood 100   ? Total Output -2   ? Net +1612   ?     ? Urine Occurrence 1 x   ? Stool Occurrence 0 x   ?  ? ?LABS ? ?Results for orders placed or performed during the hospital encounter of 09/16/21 (from the past 24 hour(s))  ?Glucose, capillary     Status: Abnormal  ? Collection Time: 09/17/21 10:05 AM  ?Result Value Ref Range  ? Glucose-Capillary 121 (H) 70 - 99 mg/dL  ?Glucose, capillary     Status: Abnormal  ? Collection Time: 09/17/21  1:42 PM  ?Result Value Ref Range  ? Glucose-Capillary 143 (H) 70 - 99 mg/dL  ?Glucose, capillary     Status: Abnormal  ? Collection Time: 09/17/21  6:46 PM  ?Result Value Ref Range  ? Glucose-Capillary 169 (H) 70 - 99 mg/dL  ?CBC     Status: Abnormal  ? Collection Time: 09/18/21  3:37 AM  ?Result Value Ref Range  ? WBC 9.9 4.0 - 10.5 K/uL  ? RBC 2.90 (L)  3.87 - 5.11 MIL/uL  ? Hemoglobin 8.5 (L) 12.0 - 15.0 g/dL  ? HCT 25.5 (L) 36.0 - 46.0 %  ? MCV 87.9 80.0 - 100.0 fL  ? MCH 29.3 26.0 - 34.0 pg  ? MCHC 33.3 30.0 - 36.0 g/dL  ? RDW 13.1 11.5 - 15.5 %  ? Platelets 238 150 - 400 K/uL  ? nRBC 0.0 0.0 - 0.2 %  ?Renal function panel     Status: Abnormal  ? Collection Time: 09/18/21  3:37 AM  ?Result Value Ref Range  ? Sodium 137 135 - 145 mmol/L  ? Potassium 4.3 3.5 - 5.1 mmol/L  ? Chloride 99 98 - 111 mmol/L  ? CO2 27 22 - 32 mmol/L  ? Glucose, Bld 168 (H) 70 - 99 mg/dL  ? BUN 63 (H) 6 - 20 mg/dL  ?  Creatinine, Ser 5.55 (H) 0.44 - 1.00 mg/dL  ? Calcium 8.2 (L) 8.9 - 10.3 mg/dL  ? Phosphorus 5.1 (H) 2.5 - 4.6 mg/dL  ? Albumin 2.6 (L) 3.5 - 5.0 g/dL  ? GFR, Estimated 8 (L) >60 mL/min  ? Anion gap 11 5 - 15  ?Save Smear for Provider Slide Review     Status: None  ? Collection Time: 09/18/21  3:37 AM  ?Result Value Ref Range  ? Smear Review SMEAR STAINED AND AVAILABLE FOR REVIEW   ?VITAMIN D 25 Hydroxy (Vit-D Deficiency, Fractures)     Status: None  ? Collection Time: 09/18/21  3:37 AM  ?Result Value Ref Range  ? Vit D, 25-Hydroxy 93.97 30 - 100 ng/mL  ?Glucose, capillary     Status: Abnormal  ? Collection Time: 09/18/21  7:23 AM  ?Result Value Ref Range  ? Glucose-Capillary 122 (H) 70 - 99 mg/dL  ? ? ? ?PHYSICAL EXAM:  ? ?Gen: sitting up in bed, sleepy but arousable and answers questions appropriately  ?Lungs: unlabored  ?Ext:  ?     Left Lower Extremity  ? Dressing/splint intact ? Incisional vac with good suction, no drainage as expected ? Ext warm  ? Wiggles toes  ? DPN, SPN, TN sensation grossly intact  ? No pain out of proportion with passive stretching of toes  ? Swelling stable  ? ?Assessment/Plan: ?1 Day Post-Op  ? ?Principal Problem: ?  ESRD (end stage renal disease) ([place]) ?Active Problems: ?  Hypertension associated with chronic kidney disease due to type 2 diabetes mellitus ([facility]) ?  Diabetes mellitus type 2 with complications ([place]) ?  Anemia associated with  chronic renal failure ?  Gait instability ?  Diabetic retinopathy with macular edema ([place]) ?  Restless legs syndrome (RLS) ?  Uremia ?  Falls ?  Syncope ?  Closed trimalleolar fracture of ankle, left, initial encounter ?  Heart failure, systolic and diastolic ([place]) ?  Acute renal failure (ARF) ([facility]) ? ? ?Anti-infectives (From admission, onward)  ? ? Start     Dose/Rate Route Frequency Ordered Stop  ? 09/18/21 1000  ceFAZolin (ANCEF) IVPB 1 g/50 mL premix  Status:  Discontinued       ? 1 g ?100 mL/hr over 30 Minutes Intravenous  Once 09/17/21 1818 09/18/21 0844  ? 09/18/21 1000  ceFAZolin (ANCEF) IVPB 1 g/50 mL premix       ? 1 g ?100 mL/hr over 30 Minutes Intravenous Daily 09/18/21 0844 09/20/21 0959  ? 09/17/21 0615  ceFAZolin (ANCEF) IVPB 2g/100 mL premix       ? 2 g ?200 mL/hr over 30 Minutes Intravenous On call to O.R. 09/17/21 0525 09/17/21 1125  ? ?  ?. ? ?POD/HD#: 1 ? ?61 y/o female ground level fall with L trimalleolar ankle fracture dislocation s/p ORIF  ?  ?Weightbearing: NWB LLE ?ROM: unrestricted L knee ROM, no ankle ROM as pt splinted  ?Insicional and dressing care: Dressings left intact until follow-up, will hook up to home prevena unit upon discharge.  Leave hooked to hospital vac for now.  Please make sure unit is plugged in and charged  ?Pain management: multimodal.  Has only used tylenol thus far.  Norco ordered for breakthrough pian  ?Impediments to Fracture Healing: renal disease.  Clinically very  poor bone quality  ?Bone Health/Optimization: address renal disease.  Will need DEXA as outpt  ? ?VTE prophylaxis: currently on heparin.  Recommend eliquis 2.5 mg po BID at dc x 30  days  ? ?Dispo: Ortho issues stable ? Therapy evals  ? May need SNF  ? ? Discussed with great detail with the pt the importance of maintaining strict nonweightbearing on her leg for the next 8 weeks.  Premature weightbearing can lead to catastrophic hardware failure and loss of reduction  ?  ? ?Follow - up plan: 2  weeks ? ?Contact information:  Altamese Sarasota MD, Ainsley Spinner PA-C ? ? ?Jari Pigg, PA-C ?[phone] (C) ?09/18/2021, 9:23 AM ? ?[facility] ?[place] [address] ?[phone] Jenetta Downer) ?[phone] (F) ? ? ? ?After 5pm and on the weekends please log on to Amion, go to orthopaedics and the look under the [facility] Call for the provider(s) on call. You can also call our office at [phone] and then follow the prompts to be connected to the call team.  ? Patient ID: DAKIA SCHIFANO, female   DOB: [birth year], 61 y.o.   MRN: [id number] ? ?

## 2021-09-18 NOTE — Progress Notes (Signed)
PT Cancellation Note ? ?Patient Details ?Name: Jessica Mercado ?MRN: [id number] ?DOB: January 24, 1961 ? ? ?Cancelled Treatment:    Reason Eval/Treat Not Completed: Patient at procedure or test/unavailable Off unit in HD. Will re-attempt as time allows.  ? ?Ugonna Keirsey A. Gilford Rile, PT, DPT ?Acute Rehabilitation Services ?Pager [phone] ?Office [phone] ? ? ? ?Jessica Mercado ?09/18/2021, 11:50 AM ? ? ?

## 2021-09-18 NOTE — Progress Notes (Addendum)
Family Medicine Teaching Service ?Daily Progress Note ?Intern Pager: [phone] ? ?Patient name: Jessica Mercado Medical record number: [id number] ?Date of birth: [birth year] Age: 61 y.o. Gender: female ? ?Primary Care Provider: Vassie Moment, MD ?Consultants: Nephrology, Orthopedic Surgery, Vascular Surgery ?Code Status: DNR ? ?Pt Overview and Major Events to Date:  ?4/24- admitted ?4/25- OR with ortho and VVS for fixation of trimalleolar fracture and placement of TDC ?4/25- First HD session ? ?Assessment and Plan: ?Jessica Mercado is a 61 y.o. female presenting with left ankle pain after a fall at home, found to have trimalleolar fracture. Here for surgical fixation and initiation of HD for uremia. 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  ?  ?CKD V now ESRD s/p initiation of HD ?Uremia ?Patient had TDC placed in the OR with VVS yesterday and was successfully dialyzed yesterday evening. BUN 90>63. Patient very tired and feeling achy after dialysis. Mental status relatively unchanged. ?- Nephrology following, appreciate recs ?- Continue HD per nephro ?- Will need to be CLIPed for outpatient HD ? ?Trimalleolar Fracture s/p ORIF ?POD#1. Pain reasonably controlled with Tylenol, has not gotten any doses of PRN Norco. ?- F/u ortho recs ?- PT/OT ?- Tylenol 500mg  q12h ?- Norco 1-2 tabs q6h PRN ? ?Suprapubic Pain  UTI ?Urine culture with >100,000 colonies E Coli. Given suprapubic pain on admission and findings of air within urinary bladder on imaging, will treat as a true UTI. Remains afebrile and without leukocytosis (WBC 9.9). Of note, did receive Ancef surgical ppx which should have activity against E coli.  ?- Can extend ancef therapy for one more day for a total of 3 days therapy ? ?Anemia of CKD ?Hgb stable post-operatively 8.4>8.5.  ?- Monitor ?- Aranesp with HD on Tuesdays ? ?Diabetes ?CBG 122-169, got 1 unit SAI yesterday. ?- Will decrease gabapentin to 100mg   daily given renal function and sleepiness this am ?- CBGs, vsSSI ? ?HTN ?BP 130s/70s this am. Had some soft pressures after HD last night, but otherwise within acceptable limits. ?- Continue Prazosin and Metoprolol for now, can hold prazosin if BP softens again ? ?Elevated B12 ?Smear obtained this am. Anticipate clearance of water-soluble vitamin with HD and time if patient has been oversupplementing. Will re-check later in the week and if remains markedly elevated, may warrant further workup.  ?- B12 on Friday ?- Follow-up peripheral smear ? ?Chronic, stable conditions ?Bipolar Disorder-Continue Latuda, prozac, benztropine ?Hyperlipidemia- Continue home atorvastatin ?Insomnia- Continue home Trazodone ? ?FEN/GI: Renal, carb-modified ?PPx: Heparin, had been held for surgery ?Dispo:Pending PT recommendations  pending clinical improvement . Barriers include pain control, CLIP, dialysis, PT/OT assessment.  ? ?Subjective:  ?Jessica Mercado is very sleepy this morning.  She does awaken with verbal and tactile stimuli, she has no acute complaints this morning though she notes that she feels weak and achy after dialysis last night. ? ?Objective: ?Temp:  [97.3 ?F (36.3 ?C)-97.9 ?F (36.6 ?C)] 97.4 ?F (36.3 ?C) (04/26 9509) ?Pulse Rate:  [66-73] 70 (04/26 0452) ?Resp:  [11-20] 18 (04/26 0452) ?BP: (89-144)/(50-82) 131/79 (04/26 0452) ?SpO2:  [87 %-97 %] 91 % (04/26 0452) ?Weight:  [89.8 kg-91.2 kg] 91.2 kg (04/25 1750) ?Physical Exam: ?General: Sleeping heavily, somewhat difficult to awaken but answers questions appropriately upon awakening ?Cardiovascular: Regular rate and rhythm, 2/2 systolic murmur ?Respiratory: Normal WOB on RA ?Abdomen: Soft, non-tender ?Extremities: LLE post-op, RLE without deformity, does have non-pitting edema but states this is her  baseline ? ?Laboratory: ?Recent Labs  ?Lab 09/16/21 ?0928 09/17/21 ?0159 09/18/21 ?0337  ?WBC 16.4* 10.4 9.9  ?HGB 10.4* 8.4* 8.5*  ?HCT 31.8* 25.4* 25.5*  ?PLT 277 269 238   ? ?Recent Labs  ?Lab 09/16/21 ?0928 09/16/21 ?1306 09/17/21 ?0159 09/18/21 ?0337  ?NA 136  --  136 137  ?K 4.3  --  3.8 4.3  ?CL 96*  --  100 99  ?CO2 24  --  25 27  ?BUN 86*  --  90* 63*  ?CREATININE 8.06*  --  7.78* 5.55*  ?CALCIUM 8.5*  --  8.4* 8.2*  ?PROT  --  6.4*  --   --   ?BILITOT  --  1.2  --   --   ?ALKPHOS  --  75  --   --   ?ALT  --  22  --   --   ?AST  --  23  --   --   ?GLUCOSE 182*  --  113* 168*  ? ? ?Imaging/Diagnostic Tests: ?VAS [place] UPPER EXT VEIN MAPPING (PRE-OP AVF) ?UPPER EXTREMITY VEIN MAPPING ? ?Patient Name:  Jessica Mercado  Date of Exam:   09/17/2021 ?Medical Rec #: [id number]       Accession #:    [id number] ?Date of Birth: 01-Oct-1960       Patient Gender: F ?Patient Age:   16 years ?Exam Location:  [facility] ?Procedure:      VAS [place] UPPER EXT VEIN MAPPING (PRE-OP AVF) ?Referring Phys: Pearson Grippe ? ?-------------------------------------------------------------------------------- ?  ?Indications: Pre-access. ? ?Performing Technologist: Archie Patten RVS ? ?  ?Examination Guidelines: A complete evaluation includes B-mode imaging, spectral ?Doppler, color Doppler, and power Doppler as needed of all accessible portions ?of each vessel. Bilateral testing is considered an integral part of a complete ?examination. Limited examinations for reoccurring indications may be performed ?as noted. ? ?+-----------------+-------------+----------+--------+ ?Right Cephalic   Diameter (cm)Depth (cm)Findings ?+-----------------+-------------+----------+--------+ ?Shoulder             0.20        0.60            ?+-----------------+-------------+----------+--------+ ?Prox upper arm       0.17        0.56            ?+-----------------+-------------+----------+--------+ ?Mid upper arm        0.24        0.86            ?+-----------------+-------------+----------+--------+ ?Dist upper arm       0.25        0.61             ?+-----------------+-------------+----------+--------+ ?Antecubital fossa    0.46        0.35            ?+-----------------+-------------+----------+--------+ ?Prox forearm         0.21        0.63            ?+-----------------+-------------+----------+--------+ ?Mid forearm          0.21        0.60            ?+-----------------+-------------+----------+--------+ ?Dist forearm         0.11        0.60            ?+-----------------+-------------+----------+--------+ ?Wrist                0.10  0.48            ?+-----------------+-------------+----------+--------+ ? ?+-----------------+-------------+----------+--------------+ ?Right Basilic    Diameter (cm)Depth (cm)   Findings    ?+-----------------+-------------+----------+--------------+ ?Shoulder                                not visualized ?+-----------------+-------------+----------+--------------+ ?Prox upper arm                          not visualized ?+-----------------+-------------+----------+--------------+ ?Mid upper arm                           not visualized ?+-----------------+-------------+----------+--------------+ ?Dist upper arm       0.23        1.77                  ?+-----------------+-------------+----------+--------------+ ?Antecubital fossa    0.24        1.48                  ?+-----------------+-------------+----------+--------------+ ?Prox forearm         0.12        0.72     branching    ?+-----------------+-------------+----------+--------------+ ?Mid forearm                             not visualized ?+-----------------+-------------+----------+--------------+ ?Distal forearm                          not visualized ?+-----------------+-------------+----------+--------------+ ?Elbow                                   not visualized ?+-----------------+-------------+----------+--------------+ ?Wrist                                   not  visualized ?+-----------------+-------------+----------+--------------+ ? ?+-----------------+-------------+----------+--------------+ ?Left Cephalic    Diameter (cm)Depth (cm)   Findings    ?+-----------------+-------------+----------+--------------+ ?Shoulder                                not visualized ?+-----------------+-------------+----------+--------------+ ?Prox upper

## 2021-09-18 NOTE — Progress Notes (Signed)
Admit: 09/16/2021 ?LOS: 1 ? ?42F s/p fall and L ankle fracture with progressive CKD5 now uremic, ESRD ? ?Subjective:  ?HD#1 yesterday after TDC with VVS, toelrated well ?Groggy and confused this AM ?S/p ORIF of L ankle fracture yesterday ?CLIP in process ? ?04/25 0701 - 04/26 0700 ?In: 0737 [P.O.:360; I.V.:900; IV Piggyback:350] ?Out: -2 [Blood:100] ? ?Filed Weights  ? 09/17/21 1530 09/17/21 1750 09/18/21 1148  ?Weight: 91.2 kg 91.2 kg 92.5 kg  ? ? ?Scheduled Meds: ? acetaminophen  500 mg Oral Q12H  ? atorvastatin  40 mg Oral Daily  ? benztropine  1 mg Oral QPM  ? Chlorhexidine Gluconate Cloth  6 each Topical Q0600  ? [START ON 09/24/2021] darbepoetin (ARANESP) injection - DIALYSIS  40 mcg Intravenous Q Tue-HD  ? FLUoxetine  60 mg Oral q morning  ? [START ON 09/19/2021] gabapentin  100 mg Oral Daily  ? heparin injection (subcutaneous)  5,000 Units Subcutaneous Q8H  ? insulin aspart  0-6 Units Subcutaneous TID WC  ? lurasidone  40 mg Oral QHS  ? metoprolol succinate  50 mg Oral Daily  ? multivitamin with minerals  1 tablet Oral Daily  ? prazosin  1 mg Oral QPM  ? traZODone  300 mg Oral QHS  ? ?Continuous Infusions: ? sodium chloride    ? sodium chloride    ?  ceFAZolin (ANCEF) IV    ? [START ON 09/19/2021] ferric gluconate (FERRLECIT) IVPB Stopped (09/17/21 1811)  ? ?PRN Meds:.sodium chloride, sodium chloride, HYDROcodone-acetaminophen ? ?Current Labs: reviewed  ? Latest Reference Range & Units 09/17/21 01:59  ?Hepatitis B Surface Ag NON REACTIVE  NON REACTIVE  ?Hep B S Ab NON REACTIVE  NON REACTIVE  ? ? ?Physical Exam:  Blood pressure 121/65, pulse 70, temperature 98.4 ?F (36.9 ?C), resp. rate 14, height 5\' 7"  (1.702 m), weight 92.5 kg, SpO2 98 %. ?GEN: Chronically ill-appearing, conversant, slow, clearly not as cognitively sharp as the last time I saw her ?ENT: NCAT ?EYES: EOMI ?CV: Regular, no discernible rub ?PULM: Clear bilaterally ?ABD: Soft, nontender ?SKIN: Left foot/ankle bandaged ?EXT: No significant  edema ? 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 ?P ?HD#2 today, gradual uptitration of Rx ?CLIP in process ?Medication Issues; ?Preferred narcotic agents for pain control are hydromorphone, fentanyl, and methadone. Morphine should not be used.  ?Baclofen should be avoided ?Avoid oral sodium phosphate and magnesium citrate based laxatives / bowel preps  ? ? ?Pearson Grippe MD ?09/18/2021, 1:08 PM ? ?Recent Labs  ?Lab 09/16/21 ?0928 09/16/21 ?1306 09/17/21 ?0159 09/18/21 ?0337  ?NA 136  --  136 137  ?K 4.3  --  3.8 4.3  ?CL 96*  --  100 99  ?CO2 24  --  25 27  ?GLUCOSE 182*  --  113* 168*  ?BUN 86*  --  90* 63*  ?CREATININE 8.06*  --  7.78* 5.55*  ?CALCIUM 8.5*  --  8.4* 8.2*  ?PHOS  --  5.0* 5.1* 5.1*  ? ? ?Recent Labs  ?Lab 09/16/21 ?0928 09/17/21 ?0159 09/18/21 ?0337  ?WBC 16.4* 10.4 9.9  ?NEUTROABS 14.0*  --   --   ?  HGB 10.4* 8.4* 8.5*  ?HCT 31.8* 25.4* 25.5*  ?MCV 89.8 87.9 87.9  ?PLT 277 269 238  ? ? ? ? ? ? ? ? ? ? ?  ?

## 2021-09-19 DIAGNOSIS — E7800 Pure hypercholesterolemia, unspecified: Secondary | ICD-10-CM | POA: Insufficient documentation

## 2021-09-19 DIAGNOSIS — N19 Unspecified kidney failure: Secondary | ICD-10-CM | POA: Diagnosis not present

## 2021-09-19 DIAGNOSIS — I132 Hypertensive heart and chronic kidney disease with heart failure and with stage 5 chronic kidney disease, or end stage renal disease: Secondary | ICD-10-CM | POA: Insufficient documentation

## 2021-09-19 DIAGNOSIS — Z992 Dependence on renal dialysis: Secondary | ICD-10-CM | POA: Insufficient documentation

## 2021-09-19 DIAGNOSIS — I5042 Chronic combined systolic (congestive) and diastolic (congestive) heart failure: Secondary | ICD-10-CM | POA: Insufficient documentation

## 2021-09-19 DIAGNOSIS — S82852A Displaced trimalleolar fracture of left lower leg, initial encounter for closed fracture: Secondary | ICD-10-CM | POA: Diagnosis not present

## 2021-09-19 DIAGNOSIS — N189 Chronic kidney disease, unspecified: Secondary | ICD-10-CM | POA: Diagnosis not present

## 2021-09-19 DIAGNOSIS — Z7984 Long term (current) use of oral hypoglycemic drugs: Secondary | ICD-10-CM | POA: Insufficient documentation

## 2021-09-19 DIAGNOSIS — N186 End stage renal disease: Secondary | ICD-10-CM | POA: Diagnosis not present

## 2021-09-19 LAB — RENAL FUNCTION PANEL
Albumin: 2.5 g/dL — ABNORMAL LOW (ref 3.5–5.0)
Anion gap: 11 (ref 5–15)
BUN: 38 mg/dL — ABNORMAL HIGH (ref 6–20)
CO2: 27 mmol/L (ref 22–32)
Calcium: 8.2 mg/dL — ABNORMAL LOW (ref 8.9–10.3)
Chloride: 99 mmol/L (ref 98–111)
Creatinine, Ser: 4.13 mg/dL — ABNORMAL HIGH (ref 0.44–1.00)
GFR, Estimated: 12 mL/min — ABNORMAL LOW (ref >60)
Glucose, Bld: 91 mg/dL (ref 70–99)
Phosphorus: 2.5 mg/dL (ref 2.5–4.6)
Potassium: 3.8 mmol/L (ref 3.5–5.1)
Sodium: 137 mmol/L (ref 135–145)

## 2021-09-19 LAB — CBC
HCT: 24.5 % — ABNORMAL LOW (ref 36.0–46.0)
Hemoglobin: 7.8 g/dL — ABNORMAL LOW (ref 12.0–15.0)
MCH: 28.7 pg (ref 26.0–34.0)
MCHC: 31.8 g/dL (ref 30.0–36.0)
MCV: 90.1 fL (ref 80.0–100.0)
Platelets: 250 10*3/uL (ref 150–400)
RBC: 2.72 MIL/uL — ABNORMAL LOW (ref 3.87–5.11)
RDW: 13.4 % (ref 11.5–15.5)
WBC: 12 10*3/uL — ABNORMAL HIGH (ref 4.0–10.5)
nRBC: 0 % (ref 0.0–0.2)

## 2021-09-19 LAB — GLUCOSE, CAPILLARY
Glucose-Capillary: 106 mg/dL — ABNORMAL HIGH (ref 70–99)
Glucose-Capillary: 155 mg/dL — ABNORMAL HIGH (ref 70–99)
Glucose-Capillary: 226 mg/dL — ABNORMAL HIGH (ref 70–99)
Glucose-Capillary: 153 mg/dL — ABNORMAL HIGH (ref 70–99)

## 2021-09-19 MED ORDER — RENA-VITE PO TABS
1.0000 | ORAL_TABLET | Freq: Every day | ORAL | Status: DC
  Administered 2021-09-19 – 2021-09-24 (×6): 1 via ORAL
  Filled 2021-09-19 (×6): qty 1

## 2021-09-19 MED ORDER — HYDROMORPHONE HCL 2 MG PO TABS: 1.0000 mg | ORAL_TABLET | ORAL | Status: DC | PRN

## 2021-09-19 MED ORDER — HYDROMORPHONE HCL 2 MG PO TABS
1.0000 mg | ORAL_TABLET | Freq: Four times a day (QID) | ORAL | Status: DC | PRN
  Administered 2021-09-19 – 2021-09-21 (×4): 1 mg via ORAL
  Filled 2021-09-19 (×4): qty 1

## 2021-09-19 MED ORDER — DARBEPOETIN ALFA 40 MCG/0.4ML IJ SOSY
40.0000 ug | PREFILLED_SYRINGE | INTRAMUSCULAR | Status: DC
  Administered 2021-09-23: 40 ug via INTRAVENOUS
  Filled 2021-09-19 (×2): qty 0.4

## 2021-09-19 MED ORDER — SODIUM CHLORIDE 0.9 % IV SOLN
125.0000 mg | INTRAVENOUS | Status: DC
  Administered 2021-09-20 – 2021-09-25 (×3): 125 mg via INTRAVENOUS
  Filled 2021-09-19 (×5): qty 10

## 2021-09-19 NOTE — TOC Progression Note (Signed)
Transition of Care (TOC) - Initial/Assessment Note  ? ? ?Patient Details  ?Name: Jessica Mercado ?MRN: [id number] ?Date of Birth: 18-Mar-1961 ? ?Transition of Care (TOC) CM/SW Contact:    ?Paulene Floor Devansh Riese, LCSWA ?Phone Number: ?09/19/2021, 2:29 PM ? ?Clinical Narrative:                 ?CSW received consult for SNF and spoke with the patient.  The patient refused SNF and stated that she was going home.  CSW inquired about assistance at home.  The patient reported that her son "[name]" was home "24/7" and could assist and she also has a friend named Collier Salina who could assist.  She reports working with a home health agency previously, but could not remember the name of the agency.  The patient reports having a cane and walker at home.   ? ?  ?Barriers to Discharge: Continued Medical Work up ? ? ?Patient Goals and CMS Choice ?Patient states their goals for this hospitalization and ongoing recovery are:: To return home ?CMS Medicare.gov Compare Post Acute Care list provided to:: Patient ?Choice offered to / list presented to : Patient ? ?Expected Discharge Plan and Services ?  ?  ?Discharge Planning Services: CM Consult ?  ?Living arrangements for the past 2 months: [place] ?                ?  ?  ?  ?  ?  ?  ?  ?  ?  ?  ? ?Prior Living Arrangements/Services ?Living arrangements for the past 2 months: [place] ?Lives with:: Relatives (Son in-law, [name]) ?Patient language and need for interpreter reviewed:: Yes ?Do you feel safe going back to the place where you live?: Yes      ?Need for Family Participation in Patient Care: Yes (Comment) ?Care giver support system in place?: Yes (comment) ?Current home services: DME (Walker, cane, shower seat.) ?Criminal Activity/Legal Involvement Pertinent to Current Situation/Hospitalization: No - Comment as needed ? ?Activities of Daily Living ?Home Assistive Devices/Equipment: Gilford Rile (specify type) ?ADL Screening (condition at time of admission) ?Patient's cognitive ability  adequate to safely complete daily activities?: No ?Is the patient deaf or have difficulty hearing?: No ?Does the patient have difficulty seeing, even when wearing glasses/contacts?: Yes ?Does the patient have difficulty concentrating, remembering, or making decisions?: Yes ?Patient able to express need for assistance with ADLs?: Yes ?Does the patient have difficulty dressing or bathing?: No ?Independently performs ADLs?: No ?Does the patient have difficulty walking or climbing stairs?: Yes ?Weakness of Legs: Both ?Weakness of Arms/Hands: Both ? ?Permission Sought/Granted ?Permission sought to share information with : Case Manager, Family Supports ?Permission granted to share information with : Yes, Verbal Permission Granted ?   ?   ?   ?   ? ?Emotional Assessment ?Appearance:: Appears stated age ?Attitude/Demeanor/Rapport: Engaged, Gracious ?Affect (typically observed): Accepting, Appropriate, Calm, Hopeful ?Orientation: : Oriented to Self, Oriented to Place, Oriented to  Time, Oriented to Situation ?Alcohol / Substance Use: Not Applicable ?Psych Involvement: No (comment) ? ?Admission diagnosis:  ESRD (end stage renal disease) ([place]) [N18.6] ?Acute renal failure (ARF) ([facility]) [N17.9] ?Closed trimalleolar fracture of left ankle, initial encounter [S82.852A] ?Patient Active Problem List  ? Diagnosis Date Noted  ? Acute renal failure (ARF) ([place]) 09/17/2021  ? Gait instability 09/16/2021  ? Diabetic retinopathy with macular edema ([place]) 09/16/2021  ? Irritable bowel syndrome 09/16/2021  ? Obstructive sleep apnea 09/16/2021  ? Restless legs syndrome (RLS) 09/16/2021  ?  ESRD (end stage renal disease) ([place]) 09/16/2021  ? Uremia 09/16/2021  ? Falls 09/16/2021  ? Syncope 09/16/2021  ? Closed trimalleolar fracture of ankle, left, initial encounter 09/16/2021  ? Heart failure, systolic and diastolic ([place]) 08/04/8116  ? Diabetes mellitus type 2 with complications ([place]) 86/77/3736  ? Anemia associated with chronic renal failure  07/18/2021  ? Hyperlipidemia 07/18/2021  ? Obesity 07/18/2021  ? Hypertension associated with chronic kidney disease due to type 2 diabetes mellitus ([place]) 03/30/2020  ? Hyponatremia 03/30/2020  ? ?PCP:  Vassie Moment, MD ?Pharmacy:   ?[id number] - [place], [place] ?[address] ?[place] [address] ?Phone: [phone] Fax: [phone] ? ? ? ? ?Social Determinants of Health (SDOH) Interventions ?  ? ?Readmission Risk Interventions ? ?  07/20/2021  ?  4:39 PM  ?Readmission Risk Prevention Plan  ?Transportation Screening Complete  ?[place] or Home Care Consult Complete  ?Social Work Consult for [place] Planning/Counseling Complete  ?Palliative Care Screening Not Applicable  ?Medication Review Press photographer) Complete  ? ? ? ?

## 2021-09-19 NOTE — Progress Notes (Addendum)
Pt has been accepted at [facility] on MWF schedule. Pt can start as soon as tomorrow if needed. Pt will need to arrive at 10:10 for first appt to complete paperwork prior to 11:10 chair time. After first appt, pt will need to arrive at 10:55. Met with pt at bedside to provide and discuss above details. Pt agreeable to arrangements and provided schedule letter. Pt requests that navigator contact son-in-law, [name], to discuss arrangements as well. Spoke to [name] via phone and provided above information. [name] voices understanding and agreeable to plan. Navigator sent text to [name] with clinic details and schedule as well. Update provided to nephrologist via secure chat. Also provided schedule and clinic to RN CM to assist with making appts for transportation to/from HD. Will assist as needed.  ? ?Jessica Mercado ?Renal Navigator ?[phone] ? ?Addendum at 3:35 pm: ?Received notification from [facility] that pt will require financial clearance due to showing uninsured. Contacted [facility] admissions for clarification and awaiting response. [facility] advised pt is not showing to be uninsured from hospital perspective. Pt cannot start at clinic until this is resolved.  ?

## 2021-09-19 NOTE — Evaluation (Signed)
Physical Therapy Evaluation ?Patient Details ?Name: Jessica Mercado ?MRN: [id number] ?DOB: 23-Oct-1960 ?Today's Date: 09/19/2021 ? ?History of Present Illness ? 61 y/o female presented to ED on 09/16/21 after fall at home on 4/23. Sustained a trimalleolar fx of L ankle. S/p ORIF of L ankle on 4/25. PMH: ESRD, HTN, T2DM, heart failure, bipolar disorder  ?Clinical Impression ? Patient admitted with the above. Patient presents with weakness, decreased activity tolerance, impaired balance, and impaired functional mobility. Educated patient on WB restrictions, patient verbalized understanding but unable to demonstrate understanding. Patient required maxA+2 to stand from EOB with inability to maintain NWB on L despite max verbal and tactile cueing. Placed therapist foot under L LE with patient pushing through therapist's foot as well as moving L LE to place on ground to assist with standing. Educated patient on +2 assist at this time for OOB attempts and needing more than 1 person at home, patient not receptive and stating her son in law will take her home. Patient will benefit from skilled PT services during acute stay to address listed deficits. Recommend SNF at this time to maximize functional mobility and safety in maintaining NWB to allow for proper healing.  ?   ? ?Recommendations for follow up therapy are one component of a multi-disciplinary discharge planning process, led by the attending physician.  Recommendations may be updated based on patient status, additional functional criteria and insurance authorization. ? ?Follow Up Recommendations Skilled nursing-short term rehab (<3 hours/day) ? ?  ?Assistance Recommended at Discharge Frequent or constant Supervision/Assistance  ?Patient can return home with the following ? Two people to help with walking and/or transfers;A lot of help with bathing/dressing/bathroom;Assistance with cooking/housework;Assist for transportation ? ?  ?Equipment Recommendations Rolling Dessie Tatem  (2 wheels);BSC/3in1;Wheelchair (measurements PT);Wheelchair cushion (measurements PT);Hospital bed  ?Recommendations for Other Services ?    ?  ?Functional Status Assessment Patient has had a recent decline in their functional status and demonstrates the ability to make significant improvements in function in a reasonable and predictable amount of time.  ? ?  ?Precautions / Restrictions Precautions ?Precautions: Fall ?Required Braces or Orthoses: Splint/Cast ?Restrictions ?Weight Bearing Restrictions: Yes ?LLE Weight Bearing: Non weight bearing  ? ?  ? ?Mobility ? Bed Mobility ?Overal bed mobility: Needs Assistance ?Bed Mobility: Supine to Sit, Sit to Supine ?  ?  ?Supine to sit: Min assist ?Sit to supine: Min assist ?  ?General bed mobility comments: minA for LE management in/out bed. Patient able to use R LE to slightly bridge herself to reposition higher in bed ?  ? ?Transfers ?Overall transfer level: Needs assistance ?Equipment used: Rolling Vanna Sailer (2 wheels) ?Transfers: Sit to/from Stand ?Sit to Stand: Max assist, +2 physical assistance, +2 safety/equipment, From elevated surface ?  ?  ?  ?  ?  ?General transfer comment: maxA+2 to come into partial stand with patient unable to maintain NWB on L despite max verbal and tactile cues. Placed therapist foot under L foot but patient immediately pushing through L LE and moving L LE to place on ground. ?  ? ?Ambulation/Gait ?  ?  ?  ?  ?  ?  ?  ?  ? ?Stairs ?  ?  ?  ?  ?  ? ?Wheelchair Mobility ?  ? ?Modified Rankin (Stroke Patients Only) ?  ? ?  ? ?Balance Overall balance assessment: Needs assistance ?Sitting-balance support: No upper extremity supported, Feet supported ?Sitting balance-Leahy Scale: Fair ?Sitting balance - Comments: constant cueing to refrain from using  L LE to assist with scooting towards EOB ?  ?Standing balance support: Bilateral upper extremity supported, During functional activity ?Standing balance-Leahy Scale: Zero ?Standing balance comment:  maxA+2 to stand and unable to complete with B UE support ?  ?  ?  ?  ?  ?  ?  ?  ?  ?  ?  ?   ? ? ? ?Pertinent Vitals/Pain Pain Assessment ?Pain Assessment: 0-10 ?Pain Score: 8  ?Pain Location: L ankle ?Pain Descriptors / Indicators: Grimacing ?Pain Intervention(s): Monitored during session, Repositioned  ? ? ?Home Living Family/patient expects to be discharged to:: Private residence ?Living Arrangements: Other relatives (son in law) ?Available Help at Discharge: Family ?Type of Home: House ?Home Access: Level entry ?  ?  ?  ?Home Layout: One level ?Home Equipment: Kasandra Knudsen - single point;Shower seat;Rollator (4 wheels) ?   ?  ?Prior Function Prior Level of Function : Needs assist ?  ?  ?  ?  ?  ?  ?Mobility Comments: walks with rollatoror cane ?ADLs Comments: assist for putting on socks and shoes ?  ? ? ?Hand Dominance  ?   ? ?  ?Extremity/Trunk Assessment  ? Upper Extremity Assessment ?Upper Extremity Assessment: Defer to OT evaluation ?  ? ?Lower Extremity Assessment ?Lower Extremity Assessment: LLE deficits/detail;RLE deficits/detail ?RLE Deficits / Details: patient reports "my left leg use to compensate for my right so this one is very weak". Hx of ankle fusion ?LLE Deficits / Details: 3+/5 above knee ?LLE: Unable to fully assess due to immobilization ?  ? ?Cervical / Trunk Assessment ?Cervical / Trunk Assessment: Normal  ?Communication  ? Communication: No difficulties  ?Cognition Arousal/Alertness: Awake/alert ?Behavior During Therapy: [facility] for tasks assessed/performed ?Overall Cognitive Status: No family/caregiver present to determine baseline cognitive functioning ?  ?  ?  ?  ?  ?  ?  ?  ?  ?  ?  ?  ?  ?  ?  ?  ?General Comments: difficulty processing information provided to patient about NWB as patient required max verbal and tactile cues to attempt to maintain ?  ?  ? ?  ?General Comments   ? ?  ?Exercises    ? ?Assessment/Plan  ?  ?PT Assessment Patient needs continued PT services  ?PT Problem List Decreased  strength;Decreased activity tolerance;Decreased balance;Decreased mobility;Decreased coordination;Decreased cognition;Decreased knowledge of use of DME;Decreased knowledge of precautions;Decreased safety awareness ? ?   ?  ?PT Treatment Interventions Gait training;DME instruction;Functional mobility training;Therapeutic activities;Therapeutic exercise;Balance training;Patient/family education;Wheelchair mobility training   ? ?PT Goals (Current goals can be found in the Care Plan section)  ?Acute Rehab PT Goals ?Patient Stated Goal: to go home and my son in law will take care of me ?PT Goal Formulation: With patient ?Time For Goal Achievement: 10/03/21 ?Potential to Achieve Goals: Fair ? ?  ?Frequency Min 2X/week ?  ? ? ?Co-evaluation PT/OT/SLP Co-Evaluation/Treatment: Yes ?Reason for Co-Treatment: For patient/therapist safety;To address functional/ADL transfers ?PT goals addressed during session: Mobility/safety with mobility;Proper use of DME ?  ?  ? ? ?  ?AM-PAC PT "6 Clicks" Mobility  ?Outcome Measure Help needed turning from your back to your side while in a flat bed without using bedrails?: A Little ?Help needed moving from lying on your back to sitting on the side of a flat bed without using bedrails?: A Little ?Help needed moving to and from a bed to a chair (including a wheelchair)?: Total ?Help needed standing up from a chair using your arms (  e.g., wheelchair or bedside chair)?: Total ?Help needed to walk in hospital room?: Total ?Help needed climbing 3-5 steps with a railing? : Total ?6 Click Score: 10 ? ?  ?End of Session Equipment Utilized During Treatment: Gait belt ?Activity Tolerance: Patient tolerated treatment well ?Patient left: in bed;with call bell/phone within reach;with bed alarm set ?Nurse Communication: Mobility status ?PT Visit Diagnosis: Muscle weakness (generalized) (M62.81);Ataxic gait (R26.0);History of falling (Z91.81);Other abnormalities of gait and mobility (R26.89);Unsteadiness on  feet (R26.81) ?  ? ?Time: 7116-5790 ?PT Time Calculation (min) (ACUTE ONLY): 23 min ? ? ?Charges:   PT Evaluation ?$PT Eval Moderate Complexity: 1 Mod ?  ?  ?   ? ? ?Rodriques Badie A. Gilford Rile, PT, DPT ?Acute Rehabil

## 2021-09-19 NOTE — Evaluation (Signed)
Occupational Therapy Evaluation ?Patient Details ?Name: Jessica Mercado ?MRN: [id number] ?DOB: 31-Mar-1961 ?Today's Date: 09/19/2021 ? ? ?History of Present Illness 61 y/o female presented to ED on 09/16/21 after fall at home on 4/23. Sustained a trimalleolar fx of L ankle. S/p ORIF of L ankle on 4/25. PMH: ESRD, HTN, T2DM, heart failure, bipolar disorder  ? ?Clinical Impression ?  ?Pt admitted as above, presenting with deficits as listed below (refer to OT problem list below). Pt lives with son in law whom provides PRN assist for LB ADL's (socks, shoes, some bathing and cooking at times). PTA, she states that she ambulated with rollator at home. She was Max A +2 to stand from EOB & was not able to maintain NWB on L despite maximal multimodal cues and repetition. Pt was educated in role of OT, current need for +2 assist for OOB attempts and recommendation of SNF rehab at this time. Patient is not currently receptive, stating that son in law will take her home. Pt should benefit from acute OT to assist in maximizing independence with ADL's and functional transfers related to ADL's as well as pt education.   ?   ? ?Recommendations for follow up therapy are one component of a multi-disciplinary discharge planning process, led by the attending physician.  Recommendations may be updated based on patient status, additional functional criteria and insurance authorization.  ? ?Follow Up Recommendations ? Skilled nursing-short term rehab (<3 hours/day)  ?  ?Assistance Recommended at Discharge Frequent or constant Supervision/Assistance  ?Patient can return home with the following Two people to help with walking and/or transfers;A lot of help with bathing/dressing/bathroom;Assistance with cooking/housework;Help with stairs or ramp for entrance;Assist for transportation ? ?  ?Functional Status Assessment ? Patient has had a recent decline in their functional status and demonstrates the ability to make significant improvements in  function in a reasonable and predictable amount of time.  ?Equipment Recommendations ? BSC/3in1  ?  ?Recommendations for Other Services   ? ? ?  ?Precautions / Restrictions Precautions ?Precautions: Fall ?Required Braces or Orthoses: Splint/Cast ?Splint/Cast - Date Prophylactic Dressing Applied (if applicable): 14/97/02 ?Restrictions ?Weight Bearing Restrictions: Yes ?LLE Weight Bearing: Non weight bearing  ? ?  ? ?Mobility Bed Mobility ?Overal bed mobility: Needs Assistance ?Bed Mobility: Supine to Sit, Sit to Supine ?  ?  ?Supine to sit: Min assist ?Sit to supine: Min assist ?  ?General bed mobility comments: minA for LE management in/out bed. Patient able to use R LE to slightly bridge herself to reposition higher in bed ?  ? ?Transfers ?Overall transfer level: Needs assistance ?Equipment used: Rolling walker (2 wheels) ?Transfers: Sit to/from Stand ?Sit to Stand: Max assist, +2 physical assistance, +2 safety/equipment, From elevated surface ?  ?General transfer comment: maxA+2 to come into partial stand with patient unable to maintain NWB on L despite max verbal and tactile cues. Placed therapist foot under L foot but patient immediately pushing through L LE and moving L LE to place on ground. ?  ? ?  ?Balance Overall balance assessment: Needs assistance ?Sitting-balance support: No upper extremity supported, Feet supported ?Sitting balance-Leahy Scale: Fair ?Sitting balance - Comments: constant cueing to refrain from using L LE to assist with scooting towards EOB ?  ?Standing balance support: Bilateral upper extremity supported, During functional activity ?Standing balance-Leahy Scale: Zero ?Standing balance comment: maxA+2 to stand and unable to complete with B UE support ?  ?   ? ?ADL either performed or assessed with clinical judgement  ? ?  ADL Overall ADL's : Needs assistance/impaired ?Eating/Feeding: Modified independent;Bed level ?  ?Grooming: Set up;Sitting;Bed level ?  ?Upper Body Bathing: Sitting;Bed  level;Minimal assistance ?  ?Lower Body Bathing: +2 for physical assistance;+2 for safety/equipment;Moderate assistance;Maximal assistance;Sit to/from stand ?Lower Body Bathing Details (indicate cue type and reason): Pt unable to maintain NWB LLE despite maximal multimodal cues during sit to stand activity with PT/OT ?Upper Body Dressing : Set up;Bed level;Sitting ?  ?Lower Body Dressing: Moderate assistance;Maximal assistance;+2 for physical assistance;+2 for safety/equipment;Sit to/from stand;Sitting/lateral leans ?Lower Body Dressing Details (indicate cue type and reason): Pt unable to maintain NWB LLE despite maximal multimodal cues during sit to stand activity with PT/OT ?Toilet Transfer: Maximal assistance;+2 for physical assistance;+2 for safety/equipment;BSC/3in1;Rolling walker (2 wheels) ?Toilet Transfer Details (indicate cue type and reason): Pt unable to maintain NWB LLE despite maximal multimodal cues during sit to stand activity with PT/OT ?Toileting- Clothing Manipulation and Hygiene: Maximal assistance;+2 for physical assistance;+2 for safety/equipment;Bed level;Sitting/lateral lean;Sit to/from stand ?  ?  ?  ?Functional mobility during ADLs: Maximal assistance;+2 for physical assistance;+2 for safety/equipment;Cueing for safety;Rolling walker (2 wheels) (Pt unable to maintain NWB LLE despite maximal multimodal cues during sit to stand activity with PT/OT assessments) ?General ADL Comments: Pt admitted as above, presenting with deficits as listed below (refer to OT problem list below). Pt lives with son in law whom provides PRN assist for LB ADL's (socks, shoes, some bathing and cooking at times). PTA, she states that she ambulated with rollator at home. She was Max A +2 to stand from EOB & was not able to maintain NWB on L despite maximal multimodal cues and repetition. Pt was educated in role of OT, current need for +2 assist for OOB attempts and recommendation of SNF rehab at this time. Patient is  not currently receptive, stating that son in law will take her home. Pt should benefit from acute OT to assist in maximizing independence with ADL's and functional transfers related to ADL's as well as pt education.  ? ? ? ?Vision Patient Visual Report: No change from baseline ?Vision Assessment?: No apparent visual deficits  ?   ?   ?   ? ?Pertinent Vitals/Pain Pain Assessment ?Pain Assessment: 0-10 ?Pain Score: 8  ?Pain Location: L ankle ?Pain Descriptors / Indicators: Grimacing ?Pain Intervention(s): Limited activity within patient's tolerance, Monitored during session, Repositioned  ? ? ? ?Hand Dominance Right ?  ?Extremity/Trunk Assessment Upper Extremity Assessment ?Upper Extremity Assessment: Overall WFL for tasks assessed ?  ?Lower Extremity Assessment ?Lower Extremity Assessment: Defer to PT evaluation ?RLE Deficits / Details: patient reports "my left leg use to compensate for my right so this one is very weak". Hx of ankle fusion ?LLE Deficits / Details: 3+/5 above knee ?LLE: Unable to fully assess due to immobilization ?  ?Cervical / Trunk Assessment ?Cervical / Trunk Assessment: Normal ?  ?Communication Communication ?Communication: No difficulties ?  ?Cognition Arousal/Alertness: Awake/alert ?Behavior During Therapy: [facility] for tasks assessed/performed ?Overall Cognitive Status: No family/caregiver present to determine baseline cognitive functioning ?  ?General Comments: difficulty processing information provided to patient about NWB as patient required max verbal and tactile cues to attempt to maintain ?  ?  ?   ?   ?   ? ? ?Home Living Family/patient expects to be discharged to:: Private residence ?Living Arrangements: Other relatives (Son in Sports coach) ?Available Help at Discharge: Family ?Type of Home: House ?Home Access: Level entry ?  ?  ?Home Layout: One level ?  ?  ?  Bathroom Shower/Tub: Tub/shower unit ?  ?Bathroom Toilet: Standard ?  ?  ?Home Equipment: Kasandra Knudsen - single point;Shower seat;Rollator (4  wheels) ?  ? ?  ?Prior Functioning/Environment Prior Level of Function : Needs assist ?  ?Mobility Comments: walks with rollatoror cane ?ADLs Comments: assist for bathing, putting on socks and shoes ?  ? ?OT Pro

## 2021-09-19 NOTE — Progress Notes (Signed)
Family Medicine Teaching Service ?Daily Progress Note ?Intern Pager: [phone] ? ?Patient name: Jessica Mercado Medical record number: [id number] ?Date of birth: 12-May-1961 Age: 61 y.o. Gender: female ? ?Primary Care Provider: Vassie Moment, MD ?Consultants: Nephrology, Orthopedic Surgery, Vascular Surgery ?Code Status: DNR ? ?Pt Overview and Major Events to Date:  ?4/24- admitted ?4/25- OR with ortho and VVS for fixation of trimalleolar fracture and placement of TDC ?4/25- First HD session ? ?Assessment and Plan: ?Jessica Mercado is a 61 y.o. female presenting with left ankle pain after a fall at home, found to have trimalleolar fracture. Here for surgical fixation and initiation of HD for uremia. 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  ? 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 BUN 90>63>38. Patient is being CLIP'd for outpatient HD.  ?- Nephro following, appreciate recs, anticipate further hospital HD ?- CLIP process prior to discharge home ? ?Trimalleolar Fracture s/p ORIF ?POD#2. Pain controlled well on tylenol alone. Has not needed narcotics.  ?- Will d/c Norco order at this time, per nephro note, preferred agent should she need something would be hydromorphone, fentanyl, and methadone ?- PT/OT ?- Tylenol 500mg  q12h ? ?UTI ?Day 3/3 ancef therapy. Remains afebrile, mild leukocytosis to 12.0 this am. Suprapubic pain noted on admission has since resolved. ?- Complete course of ancef today ?- Monitor WBC on CBC ? ?Anemia of CKD ?Hgb 8.5>7.8. No evidence of active bleed.  ?- Monitor  ?- Transfusion threshold <7.0 ? ?Diabetes ?Glucose 91-341 over past 24 hours. Patient eating more. Got 2u SAI for CBG 341.  ?- CBGs, vsSSI ?- Continue gabapentin 100mg  daily ? ?HTN ?BP 110s/70s-90s overnight. ?- Continue prazosin and metoprolol, hold prazosin if soft  BP ? ?Elevated B12 ?- Recheck B12 in am ? ?Chronic, stable conditions ?Bipolar Disorder-Continue Latuda, prozac, benztropine ?Hyperlipidemia- Continue home atorvastatin ?Insomnia- Continue home Trazodone ?  ?FEN/GI: Renal, carb-modified ?PPx: Heparin SQ ?Dispo:Pending PT recommendations  pending clinical improvement . Barriers include CLIP, likely further hospital HD.  ? ?Subjective:  ?Jessica Mercado reports feeling "good" this morning.  She is much more awake, examined this morning.  Denies any acute complaints at this time.  Feels that her pain is generally well controlled. ? ?Objective: ?Temp:  [98 ?F (36.7 ?C)-98.5 ?F (36.9 ?C)] 98.4 ?F (36.9 ?C) (04/27 0545) ?Pulse Rate:  [67-72] 71 (04/27 0545) ?Resp:  [14-22] 17 (04/27 0545) ?BP: (94-152)/(46-91) 116/74 (04/27 0545) ?SpO2:  [92 %-98 %] 92 % (04/27 0545) ?Weight:  [90.7 kg-92.5 kg] 90.7 kg (04/26 1430) ?Physical Exam: ?General: Sleeping on arrival, awakens easily to voice, NAD ?Cardiovascular: Regular rate and rhythm, 2/6 systolic murmur ?Respiratory: Normal WOB on RA, occasional snoring ?Abdomen: Soft, non-tender ?Extremities: Post op LLE, RLE without deformity ? ?Laboratory: ?Recent Labs  ?Lab 09/17/21 ?0159 09/18/21 ?9937 09/19/21 ?0203  ?WBC 10.4 9.9 12.0*  ?HGB 8.4* 8.5* 7.8*  ?HCT 25.4* 25.5* 24.5*  ?PLT 269 238 250  ? ?Recent Labs  ?Lab 09/16/21 ?1306 09/17/21 ?0159 09/18/21 ?1696 09/19/21 ?0203  ?NA  --  136 137 137  ?K  --  3.8 4.3 3.8  ?CL  --  100 99 99  ?CO2  --  25 27 27   ?BUN  --  90* 63* 38*  ?CREATININE  --  7.78* 5.55* 4.13*  ?CALCIUM  --  8.4* 8.2* 8.2*  ?PROT 6.4*  --   --   --   ?  BILITOT 1.2  --   --   --   ?ALKPHOS 75  --   --   --   ?ALT 22  --   --   --   ?AST 23  --   --   --   ?GLUCOSE  --  113* 168* 91  ? ? ?Imaging/Diagnostic Tests: ?No new imaging, tests ? ?Jessica Gibson, MD ?09/19/2021, 7:40 AM ?PGY-1, [facility] ?[place] Intern pager: [phone], text pages welcome ? ?

## 2021-09-19 NOTE — Progress Notes (Signed)
Admit: 09/16/2021 ?LOS: 2 ? ?53F s/p fall and L ankle fracture with progressive CKD5 now uremic, ESRD ? ?Subjective:  ?More awake and alert today ?Received second HD treatment yesterday, 0.7 L UF ?BPs stable ?CLIP in complete [place] [facility] ? ?04/26 0701 - 04/27 0700 ?In: 520 [P.O.:360; IV Piggyback:160] ?Out: 1139 [Urine:400] ? ?Filed Weights  ? 09/17/21 1750 09/18/21 1148 09/18/21 1430  ?Weight: 91.2 kg 92.5 kg 90.7 kg  ? ? ?Scheduled Meds: ? acetaminophen  500 mg Oral Q12H  ? atorvastatin  40 mg Oral Daily  ? benztropine  1 mg Oral QPM  ? Chlorhexidine Gluconate Cloth  6 each Topical Q0600  ? [START ON 09/24/2021] darbepoetin (ARANESP) injection - DIALYSIS  40 mcg Intravenous Q Tue-HD  ? FLUoxetine  60 mg Oral q morning  ? gabapentin  100 mg Oral Daily  ? heparin injection (subcutaneous)  5,000 Units Subcutaneous Q8H  ? insulin aspart  0-6 Units Subcutaneous TID WC  ? lurasidone  40 mg Oral QHS  ? metoprolol succinate  50 mg Oral Daily  ? multivitamin  1 tablet Oral QHS  ? prazosin  1 mg Oral QPM  ? traZODone  300 mg Oral QHS  ? ?Continuous Infusions: ? sodium chloride    ? sodium chloride    ?  ceFAZolin (ANCEF) IV 1 g (09/19/21 1155)  ? ferric gluconate (FERRLECIT) IVPB Stopped (09/17/21 1811)  ? ?PRN Meds:.sodium chloride, sodium chloride, HYDROmorphone ? ?Current Labs: reviewed  ? Latest Reference Range & Units 09/17/21 01:59  ?Hepatitis B Surface Ag NON REACTIVE  NON REACTIVE  ?Hep B S Ab NON REACTIVE  NON REACTIVE  ? ? ?Physical Exam:  Blood pressure 131/72, pulse 71, temperature 98.2 ?F (36.8 ?C), temperature source Oral, resp. rate 14, height 5\' 7"  (1.702 m), weight 90.7 kg, SpO2 93 %. ?GEN: Chronically ill-appearing, conversant,  ?ENT: NCAT ?EYES: EOMI ?CV: Regular, no discernible rub ?PULM: Clear bilaterally ?ABD: Soft, nontender ?SKIN: Left foot/ankle bandaged ?EXT: No significant edema ?IJ TDC C/D/I ? 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 ?P ?HD#3 tomorrow: 3h, 300/500, 3K, 1-2L UF, TDC, No heparin ?F/u plan for AVF/G ?SNF? ?Medication Issues; ?Preferred narcotic agents for pain control are hydromorphone, fentanyl, and methadone. Morphine should not be used.  ?Baclofen should be avoided ?Avoid oral sodium phosphate and magnesium citrate based laxatives / bowel preps  ? ? ?Pearson Grippe MD ?09/19/2021, 12:11 PM ? ?Recent Labs  ?Lab 09/17/21 ?0159 09/18/21 ?1448 09/19/21 ?0203  ?NA 136 137 137  ?K 3.8 4.3 3.8  ?CL 100 99 99  ?CO2 25 27 27   ?GLUCOSE 113* 168* 91  ?BUN 90* 63* 38*  ?CREATININE 7.78* 5.55* 4.13*  ?CALCIUM 8.4* 8.2* 8.2*  ?PHOS 5.1* 5.1* 2.5  ? ? ?Recent Labs  ?Lab 09/16/21 ?0928 09/17/21 ?0159 09/18/21 ?1856 09/19/21 ?0203  ?WBC 16.4* 10.4 9.9 12.0*  ?NEUTROABS 14.0*  --   --   --   ?HGB 10.4* 8.4* 8.5* 7.8*  ?HCT 31.8* 25.4* 25.5* 24.5*  ?MCV 89.8 87.9 87.9 90.1  ?PLT 277 269 238 250  ? ? ? ? ? ? ? ? ? ? ?  ?

## 2021-09-19 NOTE — Progress Notes (Signed)
Initial Nutrition Assessment ? ?DOCUMENTATION CODES:  ? ?Not applicable ? ?INTERVENTION:  ? ?Renal Nutrition Education initiated; reinforced importance of adequate protein intake as well as taking Renal MVI daily. Encouraged pt to follow-up with outpatient RD at dialysis center at discharge. Further inpatient education on follow-up ? ?Change Standard MVI with Minerals to Renal MVI daily ? ?Consider liberalizing diet ? ?Pt declines oral nutrition supplements at this time ? ?NUTRITION DIAGNOSIS:  ? ?Increased nutrient needs related to catabolic illness as evidenced by estimated needs. ? ? ?GOAL:  ? ?Patient will meet greater than or equal to 90% of their needs ? ? ?MONITOR:  ? ?PO intake, Labs, Weight trends ? ?REASON FOR ASSESSMENT:  ? ?Malnutrition Screening Tool ?  ? ?ASSESSMENT:  ? ?61 yo female admitted post fall at home with left trimalleolar fracture requiring surgical repair; also with CKD V now progressed to ESRD with initiation of iHD this admission. PMH incldes CKD, CHF, DM, HTN, CHF ? ?4/25 TDC placed, 1st iHD, ORIF Left ankle ?4/26 2nd iHD ? ?Noted plan for 3rd iHD tomorrow, 4/28. Pt reports dialysis is going well; she is feeling less fatigued post iHD ?CLIP in process ?+uremia on admission ? ?Recorded po intake 25-90% . Pt reports appetite is good she just does not like the food here. Pt reports her "son-in-law" lives with her and does the cooking. Also reports her daughter broke up with "son-in-law" and daughter is currently living in [place] but they have been keeping her posted on medical plans.  ? ?Discussed possibility of protein supplement while here in hospital; pt declined at this time. Encouraged protein intake with each meal at home; reviewed sources of protein.  ? ?Current wt 90.7 kg; pt reports weight loss due to fluid loss only ? ?Pt is edentulous but declines issues chewing; pt reports she can :gum anything." Pt reports she can even eat steak. No dentures ? ?Labs: CBGs 106-341, phosphorus  2.5 (wdl), potassium 3.8 (wdl), no iPTH ?Meds: aranesp, ferric gluconate, ss novolog, MVI with Mineraals ? ? ?NUTRITION - FOCUSED PHYSICAL EXAM: ? ?Flowsheet Row Most Recent Value  ?Orbital Region No depletion  ?Upper Arm Region No depletion  ?Thoracic and Lumbar Region No depletion  ?Buccal Region No depletion  ?Temple Region No depletion  ?Clavicle Bone Region No depletion  ?Clavicle and Acromion Bone Region No depletion  ?Scapular Bone Region No depletion  ?Dorsal Hand No depletion  ?Patellar Region Unable to assess  ?Anterior Thigh Region Unable to assess  ?Posterior Calf Region Unable to assess  [cast]  ?Edema (RD Assessment) Mild  ? ?  ? ? ?Diet Order:   ?Diet Order   ? ?       ?  Diet renal/carb modified with fluid restriction Diet-HS Snack? Nothing; Fluid restriction: 1200 mL Fluid; Room service appropriate? Yes; Fluid consistency: Thin  Diet effective now       ?  ? ?  ?  ? ?  ? ? ?EDUCATION NEEDS:  ? ?Education needs have been addressed ? ?Skin:  Skin Assessment: Reviewed RN Assessment ? ?Last BM:  4/24 ? ?Height:  ? ?Ht Readings from Last 1 Encounters:  ?09/17/21 5\' 7"  (1.702 m)  ? ? ?Weight:  ? ?Wt Readings from Last 1 Encounters:  ?09/18/21 90.7 kg  ? ? ? ?BMI:  Body mass index is 31.32 kg/m?. ? ?Estimated Nutritional Needs:  ? ?Kcal:  1800-2000 kcals ? ?Protein:  85-100 g ? ?Fluid:  1000 mL plus UOP ? ? ? ?[employer]  Rico Junker MS, RDN, LDN, CNSC ?Registered Dietitian III ?Clinical Nutrition ?RD Pager and On-Call Pager Number Located in [place]  ? ?

## 2021-09-19 NOTE — Discharge Instructions (Addendum)
Dear Jessica Mercado, ? ?Thank you for letting us participate in your care. You were hospitalized for multiple falls and an ankle fracture and diagnosed with ESRD (end stage renal disease) ([place]). You were treated with hemodialysis (HD) and surgical repair of your ankle fracture.  ? ?POST-HOSPITAL & CARE INSTRUCTIONS ?You will be starting dialysis upon returning home. This will be a major life change for you and Jessica Mercado but we believe it is the best way to keep you safe and healthy moving forward. ?Please stop taking your torsemide. We will manage your fluid status with HD. ?We are starting you on a different multivitamin, it has different concentrations of vitamins tailored to your kidney disease ?Your blood sugars seem to be excellently controlled on your Ozempic. You can stop taking insulin for now. ?Please reduce your dose of gabapentin to just 100mg   daily. This medication dose has to be adjusted based on your renal function.  ?Go to your follow up appointments (listed below) ? ? ?DOCTOR'S APPOINTMENT   ?Future Appointments  ?Date Time Provider [place]  ?10/18/2021 12:20 PM Broadus John, MD [facility]  ? ? Follow-up Information   ? ? Altamese Mayflower, MD Follow up in 1 week(s).   ?Specialty: Orthopedic Surgery ?Contact information: ?[place] [address] ?[phone] ? ? ?  ?  ? ? [facility] Follow up in 3 week(s).   ?Specialty: Vascular Surgery ?Contact information: ?[address] ?[place] ?[phone] ? ?  ?  ? ?  ?  ? ?  ? ? ?Take care and be well! ? ?Family Medicine Teaching Service Inpatient Team ?[place]  ?[facility]  ?[address] [place], [place] [address] ?[phone] ? ? ? ? ? ? ?Orthopaedic Trauma Service Discharge Instructions ? ? ?General Discharge Instructions ? ?Orthopaedic Injuries: ? Left ankle fracture dislocation treated with open reduction internal fixation using plate and  screws ? ?WEIGHT BEARING STATUS: Nonweightbearing left leg ? ?RANGE OF MOTION/ACTIVITY: No ankle range of motion as you are splinted.  Unrestricted range of motion of left knee ? ? ?Wound Care: Do not remove splint.  Do not get splint wet.  You are also hooked up to an incisional wound VAC.  This will remain on until your follow-up visit.  Please make sure that unit is charged.  It can be disconnected for short periods of time ? ? ?Diet: as you were eating previously.  Can use over the counter stool softeners and bowel preparations, such as Miralax, to help with bowel movements.  Narcotics can be constipating.  Be sure to drink plenty of fluids ? ?PAIN MEDICATION USE AND EXPECTATIONS ? You have likely been given narcotic medications to help control your pain.  After a traumatic event that results in an fracture (broken bone) with or without surgery, it is ok to use narcotic pain medications to help control one's pain.  We understand that everyone responds to pain differently and each individual patient will be evaluated on a regular basis for the continued need for narcotic medications. Ideally, narcotic medication use should last no more than 6-8 weeks (coinciding with fracture healing).  ? As a patient it is your responsibility as well to monitor narcotic medication use and report the amount and frequency you use these medications when you come to your office visit.  ? We would also advise that if you are using narcotic medications, you should take a dose prior  to therapy to maximize you participation. ? ?IF YOU ARE ON NARCOTIC MEDICATIONS IT IS NOT PERMISSIBLE TO OPERATE A MOTOR VEHICLE (MOTORCYCLE/CAR/TRUCK/MOPED) OR HEAVY MACHINERY ?DO NOT MIX NARCOTICS WITH OTHER CNS (CENTRAL NERVOUS SYSTEM) DEPRESSANTS SUCH AS ALCOHOL ? ? ?POST-OPERATIVE OPIOID TAPER INSTRUCTIONS: ?It is important to wean off of your opioid medication as soon as possible. If you do not need pain medication after your surgery it is ok to stop  day one. ?Opioids include: ?Codeine, Hydrocodone(Norco, Vicodin), Oxycodone(Percocet, oxycontin) and hydromorphone amongst others.  ?Long term and even short term use of opiods can cause: ?Increased pain response ?Dependence ?Constipation ?Depression ?Respiratory depression ?And more.  ?Withdrawal symptoms can include ?Flu like symptoms ?Nausea, vomiting ?And more ?Techniques to manage these symptoms ?Hydrate well ?Eat regular healthy meals ?Stay active ?Use relaxation techniques(deep breathing, meditating, yoga) ?Do Not substitute Alcohol to help with tapering ?If you have been on opioids for less than two weeks and do not have pain than it is ok to stop all together.  ?Plan to wean off of opioids ?This plan should start within one week post op of your fracture surgery  ?Maintain the same interval or time between taking each dose and first decrease the dose.  ?Cut the total daily intake of opioids by one tablet each day ?Next start to increase the time between doses. ?The last dose that should be eliminated is the evening dose.  ? ? ?STOP SMOKING OR USING NICOTINE PRODUCTS!!!! ? As discussed nicotine severely impairs your body's ability to heal surgical and traumatic wounds but also impairs bone healing.  Wounds and bone heal by forming microscopic blood vessels (angiogenesis) and nicotine is a vasoconstrictor (essentially, shrinks blood vessels).  Therefore, if vasoconstriction occurs to these microscopic blood vessels they essentially disappear and are unable to deliver necessary nutrients to the healing tissue.  This is one modifiable factor that you can do to dramatically increase your chances of healing your injury.   ? (This means no smoking, no nicotine gum, patches, etc) ? ?DO NOT USE NONSTEROIDAL ANTI-INFLAMMATORY DRUGS (NSAID'S) ? Using products such as Advil (ibuprofen), Aleve (naproxen), Motrin (ibuprofen) for additional pain control during fracture healing can delay and/or prevent the healing response.   If you would like to take over the counter (OTC) medication, Tylenol (acetaminophen) is ok.  However, some narcotic medications that are given for pain control contain acetaminophen as well. Therefore, you should not exceed more than 4000 mg of tylenol in a day if you do not have liver disease.  Also note that there are may OTC medicines, such as cold medicines and allergy medicines that my contain tylenol as well.  If you have any questions about medications and/or interactions please ask your doctor/PA or your pharmacist.  ?   ? ?ICE AND ELEVATE INJURED/OPERATIVE EXTREMITY ? Using ice and elevating the injured extremity above your heart can help with swelling and pain control.  Icing in a pulsatile fashion, such as 20 minutes on and 20 minutes off, can be followed.   ? Do not place ice directly on skin. Make sure there is a barrier between to skin and the ice pack.   ? Using frozen items such as frozen peas works well as the conform nicely to the are that needs to be iced. ? ?USE AN ACE WRAP OR TED HOSE FOR SWELLING CONTROL ? In addition to icing and elevation, Ace wraps or TED hose are used to help limit and resolve swelling.  It is recommended to  use Ace wraps or TED hose until you are informed to stop.   ? When using Ace Wraps start the wrapping distally (farthest away from the body) and wrap proximally (closer to the body) ?  Example: If you had surgery on your leg or thing and you do not have a splint on, start the ace wrap at the toes and work your way up to the thigh ?       If you had surgery on your upper extremity and do not have a splint on, start the ace wrap at your fingers and work your way up to the upper arm ? ?IF YOU ARE IN A SPLINT OR CAST DO NOT REMOVE IT FOR ANY REASON  ? If your splint gets wet for any reason please contact the office immediately. You may shower in your splint or cast as long as you keep it dry.  This can be done by wrapping in a cast cover or garbage back (or similar) ? Do  Not stick any thing down your splint or cast such as pencils, money, or hangers to try and scratch yourself with.  If you feel itchy take benadryl as prescribed on the bottle for itching ? ?IF YOU ARE IN A CA

## 2021-09-19 NOTE — Progress Notes (Signed)
Plan for outpt follow up for fistula creation.  ?Will schedule and have my office reach out.  ? ?Broadus John MD ?Vascular Surgery  ?

## 2021-09-19 NOTE — Progress Notes (Signed)
? ?                              Orthopaedic Trauma Service Progress Note ? ?Patient ID: ?Jessica Mercado ?MRN: [id number] ?DOB/AGE: May 07, 1961 61 y.o. ? ?Subjective: ? ?Ortho issues stable ?States she is going home tomorrow ?     Lives with her son-in law and has another friend that can help her  ? ?Pt max assist, +2 physical assist with therapy for transfers  ?Therapy notes indicate that pt was attempting to bear weight through Left leg  ? ? ?ROS ?As above ? ?Objective:  ? ?VITALS:   ?Vitals:  ? 09/18/21 1501 09/18/21 2152 09/19/21 0545 09/19/21 0818  ?BP: (!) 124/49 (!) 113/91 116/74 131/72  ?Pulse: 67 72 71 71  ?Resp: 18 17 17 14   ?Temp: 98.1 ?F (36.7 ?C) 98.5 ?F (36.9 ?C) 98.4 ?F (36.9 ?C) 98.2 ?F (36.8 ?C)  ?TempSrc: Oral Oral Oral Oral  ?SpO2: 97% 98% 92% 93%  ?Weight:      ?Height:      ? ? ?Estimated body mass index is 31.32 kg/m? as calculated from the following: ?  Height as of this encounter: 5\' 7"  (1.702 m). ?  Weight as of this encounter: 90.7 kg. ? ? ?Intake/Output   ?   04/26 0701 ?04/27 0700 04/27 0701 ?04/28 0700  ? P.O. 360 120  ? I.V. (mL/kg)    ? IV Piggyback 160   ? Total Intake(mL/kg) 520 (5.7) 120 (1.3)  ? Urine (mL/kg/hr) 400 (0.2)   ? Drains    ? Other 739   ? Stool 0   ? Blood    ? Total Output 1139   ? Net -619 +120  ?     ? Urine Occurrence  1 x  ?  ? ?LABS ? ?Results for orders placed or performed during the hospital encounter of 09/16/21 (from the past 24 hour(s))  ?Glucose, capillary     Status: Abnormal  ? Collection Time: 09/18/21  3:00 PM  ?Result Value Ref Range  ? Glucose-Capillary 114 (H) 70 - 99 mg/dL  ?Glucose, capillary     Status: Abnormal  ? Collection Time: 09/18/21  9:52 PM  ?Result Value Ref Range  ? Glucose-Capillary 341 (H) 70 - 99 mg/dL  ?Glucose, capillary     Status: Abnormal  ? Collection Time: 09/18/21 11:46 PM  ?Result Value Ref Range  ? Glucose-Capillary 157 (H) 70 - 99 mg/dL  ?Renal function panel     Status: Abnormal  ?  Collection Time: 09/19/21  2:03 AM  ?Result Value Ref Range  ? Sodium 137 135 - 145 mmol/L  ? Potassium 3.8 3.5 - 5.1 mmol/L  ? Chloride 99 98 - 111 mmol/L  ? CO2 27 22 - 32 mmol/L  ? Glucose, Bld 91 70 - 99 mg/dL  ? BUN 38 (H) 6 - 20 mg/dL  ? Creatinine, Ser 4.13 (H) 0.44 - 1.00 mg/dL  ? Calcium 8.2 (L) 8.9 - 10.3 mg/dL  ? Phosphorus 2.5 2.5 - 4.6 mg/dL  ? Albumin 2.5 (L) 3.5 - 5.0 g/dL  ? GFR, Estimated 12 (L) >60 mL/min  ? Anion gap 11 5 - 15  ?CBC     Status: Abnormal  ? Collection Time: 09/19/21  2:03 AM  ?Result Value Ref Range  ? WBC 12.0 (H) 4.0 - 10.5 K/uL  ? RBC 2.72 (L) 3.87 - 5.11 MIL/uL  ? Hemoglobin 7.8 (  L) 12.0 - 15.0 g/dL  ? HCT 24.5 (L) 36.0 - 46.0 %  ? MCV 90.1 80.0 - 100.0 fL  ? MCH 28.7 26.0 - 34.0 pg  ? MCHC 31.8 30.0 - 36.0 g/dL  ? RDW 13.4 11.5 - 15.5 %  ? Platelets 250 150 - 400 K/uL  ? nRBC 0.0 0.0 - 0.2 %  ?Glucose, capillary     Status: Abnormal  ? Collection Time: 09/19/21  7:21 AM  ?Result Value Ref Range  ? Glucose-Capillary 106 (H) 70 - 99 mg/dL  ?Glucose, capillary     Status: Abnormal  ? Collection Time: 09/19/21 11:45 AM  ?Result Value Ref Range  ? Glucose-Capillary 155 (H) 70 - 99 mg/dL  ? ? ? ?PHYSICAL EXAM:  ? ?Gen: sitting up in bed, more awake and alert today ?Lungs: unlabored  ?Ext:  ?     Left Lower Extremity  ?            Dressing/splint intact ?            Incisional vac with good suction, no drainage as expected ?            Ext warm  ?            Wiggles toes  ?            DPN, SPN, TN sensation grossly intact  ?            No pain out of proportion with passive stretching of toes  ?            Swelling stable  ? ?Assessment/Plan: ?2 Days Post-Op  ? ? ?Anti-infectives (From admission, onward)  ? ? Start     Dose/Rate Route Frequency Ordered Stop  ? 09/18/21 1000  ceFAZolin (ANCEF) IVPB 1 g/50 mL premix  Status:  Discontinued       ? 1 g ?100 mL/hr over 30 Minutes Intravenous  Once 09/17/21 1818 09/18/21 0844  ? 09/18/21 1000  ceFAZolin (ANCEF) IVPB 1 g/50 mL premix        ? 1 g ?100 mL/hr over 30 Minutes Intravenous Daily 09/18/21 0844 09/19/21 1225  ? 09/17/21 0615  ceFAZolin (ANCEF) IVPB 2g/100 mL premix       ? 2 g ?200 mL/hr over 30 Minutes Intravenous On call to O.R. 09/17/21 0525 09/17/21 1125  ? ?  ?. ? ?POD/HD#: 2 ? ?61 y/o female ground level fall with L trimalleolar ankle fracture dislocation s/p ORIF  ?  ?Weightbearing: NWB LLE ?ROM: unrestricted L knee ROM, no ankle ROM as pt splinted  ?Insicional and dressing care: Dressings left intact until follow-up, will hook up to home prevena unit upon discharge.  Leave hooked to hospital vac for now.  Please make sure unit is plugged in and charged  ?Pain management: multimodal.  Has only used tylenol thus far.  Norco ordered for breakthrough pian  ?Impediments to Fracture Healing: renal disease.  Clinically very  poor bone quality  ?Bone Health/Optimization: address renal disease.  Will need DEXA as outpt  ?  ?VTE prophylaxis: currently on heparin.  Recommend eliquis 2.5 mg po BID at dc x 30 days  ?  ?Dispo: Ortho issues stable ?            Therapy evals  ?            May need SNF   ?I think she would benefit from SNF given amount of assistance required  ?  ?  Discussed with great detail with the pt the importance of maintaining strict nonweightbearing on her leg for the next 8 weeks.  Premature weightbearing can lead to catastrophic hardware failure and loss of reduction  ?  ?  ?Follow - up plan: 2 weeks ? ?  ?Contact information:  Altamese Bartlett MD, Ainsley Spinner PA-C ? ?Jari Pigg, PA-C ?[phone] (C) ?09/19/2021, 1:37 PM ? ?[facility] ?[place] [address] ?[phone] Jenetta Downer) ?[phone] (F) ? ? ? ?After 5pm and on the weekends please log on to Amion, go to orthopaedics and the look under the [facility] Call for the provider(s) on call. You can also call our office at [phone] and then follow the prompts to be connected to the call team.  ? Patient ID: Jessica Mercado, female   DOB: 11/22/1960, 61 y.o.   MRN: [id number] ? ?

## 2021-09-20 ENCOUNTER — Other Ambulatory Visit (HOSPITAL_COMMUNITY): Payer: Self-pay

## 2021-09-20 DIAGNOSIS — N186 End stage renal disease: Secondary | ICD-10-CM | POA: Diagnosis not present

## 2021-09-20 DIAGNOSIS — S82852A Displaced trimalleolar fracture of left lower leg, initial encounter for closed fracture: Secondary | ICD-10-CM | POA: Diagnosis not present

## 2021-09-20 DIAGNOSIS — N19 Unspecified kidney failure: Secondary | ICD-10-CM | POA: Diagnosis not present

## 2021-09-20 LAB — RENAL FUNCTION PANEL
Albumin: 2.4 g/dL — ABNORMAL LOW (ref 3.5–5.0)
Anion gap: 7 (ref 5–15)
BUN: 44 mg/dL — ABNORMAL HIGH (ref 6–20)
CO2: 28 mmol/L (ref 22–32)
Calcium: 8.3 mg/dL — ABNORMAL LOW (ref 8.9–10.3)
Chloride: 99 mmol/L (ref 98–111)
Creatinine, Ser: 4.93 mg/dL — ABNORMAL HIGH (ref 0.44–1.00)
GFR, Estimated: 10 mL/min — ABNORMAL LOW (ref >60)
Glucose, Bld: 120 mg/dL — ABNORMAL HIGH (ref 70–99)
Phosphorus: 2.9 mg/dL (ref 2.5–4.6)
Potassium: 3.7 mmol/L (ref 3.5–5.1)
Sodium: 134 mmol/L — ABNORMAL LOW (ref 135–145)

## 2021-09-20 LAB — CBC
HCT: 25.7 % — ABNORMAL LOW (ref 36.0–46.0)
Hemoglobin: 8.5 g/dL — ABNORMAL LOW (ref 12.0–15.0)
MCH: 29.7 pg (ref 26.0–34.0)
MCHC: 33.1 g/dL (ref 30.0–36.0)
MCV: 89.9 fL (ref 80.0–100.0)
Platelets: 254 10*3/uL (ref 150–400)
RBC: 2.86 MIL/uL — ABNORMAL LOW (ref 3.87–5.11)
RDW: 13.2 % (ref 11.5–15.5)
WBC: 10.9 10*3/uL — ABNORMAL HIGH (ref 4.0–10.5)
nRBC: 0.2 % (ref 0.0–0.2)

## 2021-09-20 LAB — GLUCOSE, CAPILLARY
Glucose-Capillary: 152 mg/dL — ABNORMAL HIGH (ref 70–99)
Glucose-Capillary: 140 mg/dL — ABNORMAL HIGH (ref 70–99)
Glucose-Capillary: 163 mg/dL — ABNORMAL HIGH (ref 70–99)
Glucose-Capillary: 174 mg/dL — ABNORMAL HIGH (ref 70–99)

## 2021-09-20 LAB — VITAMIN B12: Vitamin B-12: 4451 pg/mL — ABNORMAL HIGH (ref 180–914)

## 2021-09-20 MED ORDER — HYDROMORPHONE HCL 2 MG PO TABS
1.0000 mg | ORAL_TABLET | Freq: Four times a day (QID) | ORAL | 0 refills | Status: DC | PRN
  Filled 2021-09-20: qty 6, 3d supply, fill #0

## 2021-09-20 MED ORDER — KIDNEY FAILURE BOOK: Freq: Once | Status: AC

## 2021-09-20 MED ORDER — ACETAMINOPHEN 500 MG PO TABS: 500.0000 mg | ORAL_TABLET | Freq: Two times a day (BID) | ORAL | 0 refills | Status: DC

## 2021-09-20 MED ORDER — GABAPENTIN 100 MG PO CAPS
100.0000 mg | ORAL_CAPSULE | Freq: Every day | ORAL | 0 refills | Status: DC
Start: 2021-09-21 — End: 2021-10-09
  Filled 2021-09-20: qty 30, 30d supply, fill #0

## 2021-09-20 MED ORDER — RENA-VITE PO TABS
1.0000 | ORAL_TABLET | Freq: Every day | ORAL | 0 refills | Status: AC
  Filled 2021-09-20: qty 30, 30d supply, fill #0

## 2021-09-20 MED ORDER — HEPARIN SODIUM (PORCINE) 1000 UNIT/ML IJ SOLN
INTRAMUSCULAR | Status: AC
  Administered 2021-09-20: 1000 [IU]
  Filled 2021-09-20: qty 4

## 2021-09-20 NOTE — Progress Notes (Signed)
Rounded on patient today in correlation to transition to outpatient HD. Kidney Failure book given. Patient educated at the bedside regarding care of tunneled dialysis catheter, proper medication administration on HD days and the importance of following up outpatient with the [facility] team for her permanent dialysis access. Patient also educated on the importance of adhering to scheduled dialysis treatments, the effects of fluid overload, hyperkalemia and hyperphosphatemia. Patient capable of re-verbalizing via teach back method. Also educated patient on services available through the interdisciplinary team in the clinic setting. Report that she has good support at home. Patient lives with son in law prior to admission. Handouts and contact information provided to patient for any further assistance. Will follow as appropriate. ? ?

## 2021-09-20 NOTE — TOC Progression Note (Signed)
Transition of Care (TOC) - Initial/Assessment Note  ? ? ?Patient Details  ?Name: Jessica Mercado ?MRN: [id number] ?Date of Birth: 1961-05-23 ? ?Transition of Care (TOC) CM/SW Contact:    ?Paulene Floor Deneisha Dade, LCSWA ?Phone Number: ?09/20/2021, 1:44 PM ? ?Clinical Narrative:                 ?CSW and RNCM contacted the patient's son in law to verify that he will be able to assit with the patient at home and getting to outpatient appointments.  The son in law reported that he has a hurt back  and will speak with the patient to encourage her to go to a SNF.   ? ?The son in law called back and reported that the patient is now willing to go to a SNF.  CSW will complete the SNF workup.  ? ?  ?Barriers to Discharge: Continued Medical Work up ? ? ?Patient Goals and CMS Choice ?Patient states their goals for this hospitalization and ongoing recovery are:: To return home ?CMS Medicare.gov Compare Post Acute Care list provided to:: Patient ?Choice offered to / list presented to : Patient ? ?Expected Discharge Plan and Services ?  ?  ?Discharge Planning Services: CM Consult ?  ?Living arrangements for the past 2 months: [place] ?Expected Discharge Date: 09/20/21               ?  ?  ?  ?  ?  ?  ?  ?  ?  ?  ? ?Prior Living Arrangements/Services ?Living arrangements for the past 2 months: [place] ?Lives with:: Relatives (Son in-law, [name]) ?Patient language and need for interpreter reviewed:: Yes ?Do you feel safe going back to the place where you live?: Yes      ?Need for Family Participation in Patient Care: Yes (Comment) ?Care giver support system in place?: Yes (comment) ?Current home services: DME (Walker, cane, shower seat.) ?Criminal Activity/Legal Involvement Pertinent to Current Situation/Hospitalization: No - Comment as needed ? ?Activities of Daily Living ?Home Assistive Devices/Equipment: [place] Rile (specify type) ?ADL Screening (condition at time of admission) ?Patient's cognitive ability adequate to safely  complete daily activities?: No ?Is the patient deaf or have difficulty hearing?: No ?Does the patient have difficulty seeing, even when wearing glasses/contacts?: Yes ?Does the patient have difficulty concentrating, remembering, or making decisions?: Yes ?Patient able to express need for assistance with ADLs?: Yes ?Does the patient have difficulty dressing or bathing?: No ?Independently performs ADLs?: No ?Does the patient have difficulty walking or climbing stairs?: Yes ?Weakness of Legs: Both ?Weakness of Arms/Hands: Both ? ?Permission Sought/Granted ?Permission sought to share information with : Case Manager, Family Supports ?Permission granted to share information with : Yes, Verbal Permission Granted ?   ?   ?   ?   ? ?Emotional Assessment ?Appearance:: Appears stated age ?Attitude/Demeanor/Rapport: Engaged, Gracious ?Affect (typically observed): Accepting, Appropriate, Calm, Hopeful ?Orientation: : Oriented to Self, Oriented to Place, Oriented to  Time, Oriented to Situation ?Alcohol / Substance Use: Not Applicable ?Psych Involvement: No (comment) ? ?Admission diagnosis:  ESRD (end stage renal disease) ([place]) [N18.6] ?Acute renal failure (ARF) ([facility]) [N17.9] ?Closed trimalleolar fracture of left ankle, initial encounter [S82.852A] ?Patient Active Problem List  ? Diagnosis Date Noted  ? Acute renal failure (ARF) ([place]) 09/17/2021  ? Gait instability 09/16/2021  ? Diabetic retinopathy with macular edema ([place]) 09/16/2021  ? Irritable bowel syndrome 09/16/2021  ? Obstructive sleep apnea 09/16/2021  ? Restless legs syndrome (RLS) 09/16/2021  ?  ESRD (end stage renal disease) ([place]) 09/16/2021  ? Uremia 09/16/2021  ? Falls 09/16/2021  ? Syncope 09/16/2021  ? Closed trimalleolar fracture of ankle, left, initial encounter 09/16/2021  ? Heart failure, systolic and diastolic ([place]) 92/42/6834  ? Diabetes mellitus type 2 with complications ([place]) 19/62/2297  ? Anemia associated with chronic renal failure 07/18/2021  ?  Hyperlipidemia 07/18/2021  ? Obesity 07/18/2021  ? Hypertension associated with chronic kidney disease due to type 2 diabetes mellitus ([place]) 03/30/2020  ? Hyponatremia 03/30/2020  ? ?PCP:  Vassie Moment, MD ?Pharmacy:   ?[id number] [place] [address] ?[place] ?[place] [address] ?Phone: [phone] Fax: [phone] ? ?[facility] ?[address] ?[place] [address] ?Phone: [phone] Fax: [phone] ? ? ? ? ?Social Determinants of Health (SDOH) Interventions ?  ? ?Readmission Risk Interventions ? ?  07/20/2021  ?  4:39 PM  ?Readmission Risk Prevention Plan  ?Transportation Screening Complete  ?[place] or Home Care Consult Complete  ?Social Work Consult for [place] Planning/Counseling Complete  ?Palliative Care Screening Not Applicable  ?Medication Review Press photographer) Complete  ? ? ? ?

## 2021-09-20 NOTE — NC FL2 (Signed)
?[place] MEDICAID FL2 LEVEL OF CARE SCREENING TOOL  ?  ? ?IDENTIFICATION  ?Patient Name: ?Jessica Mercado Birthdate: 06/17/1960 Sex: female Admission Date (Current Location): ?09/16/2021  ?[place] and [place] Number: ? [place] ?[id number] Facility and Address:  ?[facility] [place] [address] [place], [place] [address] ?     Provider Number: ?[id number]  ?Attending Physician Name and Address:  ?Zenia Resides, MD ? Relative Name and Phone Number:  ?Santos,Bill   [phone] ?   ?Current Level of Care: ?Hospital Recommended Level of Care: ?[place] Prior Approval Number: ?  ? ?Date Approved/Denied: ?  PASRR Number: ?pending ? ?Discharge Plan: ?SNF ?  ? ?Current Diagnoses: ?Patient Active Problem List  ? Diagnosis Date Noted  ? Acute renal failure (ARF) ([place]) 09/17/2021  ? Gait instability 09/16/2021  ? Diabetic retinopathy with macular edema ([place]) 09/16/2021  ? Irritable bowel syndrome 09/16/2021  ? Obstructive sleep apnea 09/16/2021  ? Restless legs syndrome (RLS) 09/16/2021  ? ESRD (end stage renal disease) ([place]) 09/16/2021  ? Uremia 09/16/2021  ? Falls 09/16/2021  ? Syncope 09/16/2021  ? Closed trimalleolar fracture of ankle, left, initial encounter 09/16/2021  ? Heart failure, systolic and diastolic ([place]) 62/83/1517  ? Diabetes mellitus type 2 with complications ([place]) 61/60/7371  ? Anemia associated with chronic renal failure 07/18/2021  ? Hyperlipidemia 07/18/2021  ? Obesity 07/18/2021  ? Hypertension associated with chronic kidney disease due to type 2 diabetes mellitus ([place]) 03/30/2020  ? Hyponatremia 03/30/2020  ? ? ?Orientation RESPIRATION BLADDER Height & Weight   ?  ?Self, Time, Situation, Place ? Normal Continent Weight: 203 lb 14.8 oz (92.5 kg) ?Height:  5\' 7"  (170.2 cm)  ?BEHAVIORAL SYMPTOMS/MOOD NEUROLOGICAL BOWEL NUTRITION STATUS  ?    Continent Diet (see d/c summary)  ?AMBULATORY STATUS COMMUNICATION OF NEEDS Skin   ?Extensive Assist Verbally Surgical  wounds ?  ?  ?  ?    ?     ?     ? ? ?Personal Care Assistance Level of Assistance  ?Bathing, Feeding, Dressing Bathing Assistance: Maximum assistance ?Feeding assistance: Limited assistance ?Dressing Assistance: Maximum assistance ?   ? ?Functional Limitations Info  ?Speech, Hearing, Sight Sight Info: Adequate ?Hearing Info: Adequate ?Speech Info: Adequate  ? ? ?SPECIAL CARE FACTORS FREQUENCY  ?PT (By licensed PT), OT (By licensed OT)   ?  ?PT Frequency: 5x/ week ?OT Frequency: 5x/ week ?  ?  ?  ?   ? ? ?Contractures Contractures Info: Not present  ? ? ?Additional Factors Info  ?Code Status, Allergies, Psychotropic Code Status Info: DNR ?Allergies Info: Vancomycin ?Psychotropic Info: Latuda benztropine  fluoxetine ?  ?  ?   ? ?Current Medications (09/20/2021):  This is the current hospital active medication list ?Current Facility-Administered Medications  ?Medication Dose Route Frequency Provider Last Rate Last Admin  ? 0.9 %  sodium chloride infusion  100 mL Intravenous PRN Ainsley Spinner, PA-C      ? 0.9 %  sodium chloride infusion  100 mL Intravenous PRN Ainsley Spinner, PA-C      ? acetaminophen (TYLENOL) tablet 500 mg  500 mg Oral Q12H Ainsley Spinner, PA-C   500 mg at 09/19/21 2150  ? atorvastatin (LIPITOR) tablet 40 mg  40 mg Oral Daily Ainsley Spinner, PA-C   40 mg at 09/20/21 0825  ? benztropine (COGENTIN) tablet 1 mg  1 mg Oral QPM Ainsley Spinner, PA-C   1 mg at 09/19/21 1700  ? Chlorhexidine Gluconate Cloth  2 % PADS 6 each  6 each Topical Q0600 Ainsley Spinner, PA-C   6 each at 09/19/21 2878  ? [START ON 09/23/2021] Darbepoetin Alfa (ARANESP) injection 40 mcg  40 mcg Intravenous Q Mon-HD Pearson Grippe B, MD      ? ferric gluconate (FERRLECIT) 125 mg in sodium chloride 0.9 % 100 mL IVPB  125 mg Intravenous Q M,W,F-HD Pearson Grippe B, MD 110 mL/hr at 09/20/21 1038 125 mg at 09/20/21 1038  ? FLUoxetine (PROZAC) capsule 60 mg  60 mg Oral q morning Ainsley Spinner, PA-C   60 mg at 09/20/21 6767  ? gabapentin (NEURONTIN) capsule 100 mg   100 mg Oral Daily Jim Like B, MD   100 mg at 09/20/21 2094  ? heparin injection 5,000 Units  5,000 Units Subcutaneous Q8H Eppie Gibson, MD   5,000 Units at 09/20/21 7096  ? HYDROmorphone (DILAUDID) tablet 1 mg  1 mg Oral Q6H PRN Eppie Gibson, MD   1 mg at 09/19/21 2836  ? insulin aspart (novoLOG) injection 0-6 Units  0-6 Units Subcutaneous TID WC Ainsley Spinner, PA-C   1 Units at 09/20/21 6294  ? lurasidone (LATUDA) tablet 40 mg  40 mg Oral QHS Ainsley Spinner, PA-C   40 mg at 09/19/21 2150  ? metoprolol succinate (TOPROL-XL) 24 hr tablet 50 mg  50 mg Oral Daily Ainsley Spinner, PA-C   50 mg at 09/20/21 1234  ? multivitamin (RENA-VIT) tablet 1 tablet  1 tablet Oral QHS McDiarmid, Blane Ohara, MD   1 tablet at 09/19/21 2150  ? prazosin (MINIPRESS) capsule 1 mg  1 mg Oral QPM Ainsley Spinner, PA-C   1 mg at 09/19/21 1700  ? traZODone (DESYREL) tablet 300 mg  300 mg Oral QHS Ainsley Spinner, PA-C   300 mg at 09/19/21 2150  ? ? ? ?Discharge Medications: ?Please see discharge summary for a list of discharge medications. ? ?Relevant Imaging Results: ? ?Relevant Lab Results: ? ? ?Additional Information ?SSN:  [id number]; Dialysis- MWF (@[facility]) arrival time 10:55 (arrival time of 10:10 on the first day); 5'7"  203lbs ? ?Paulene Floor Tuff [place], LCSWA ? ? ? ? ?

## 2021-09-20 NOTE — TOC Progression Note (Signed)
Transition of Care (TOC) - Initial/Assessment Note  ? ? ?Patient Details  ?Name: Jessica Mercado ?MRN: [id number] ?Date of Birth: 1960/09/27 ? ?Transition of Care (TOC) CM/SW Contact:    ?Paulene Floor Poetry Cerro, LCSWA ?Phone Number: ?09/20/2021, 1:28 PM ? ?Clinical Narrative:                 ?RE: Desaray Marschner ?Date of Birth:  [birth year] ?Date: 09/20/2021 ? ?Please be advised that the above-named patient will require a short-term nursing home stay - anticipated 30 days or less for rehabilitation and strengthening.  The plan is for return home.  ? ?  ?Barriers to Discharge: Continued Medical Work up ? ? ?Patient Goals and CMS Choice ?Patient states their goals for this hospitalization and ongoing recovery are:: To return home ?CMS Medicare.gov Compare Post Acute Care list provided to:: Patient ?Choice offered to / list presented to : Patient ? ?Expected Discharge Plan and Services ?  ?  ?Discharge Planning Services: CM Consult ?  ?Living arrangements for the past 2 months: [place] ?Expected Discharge Date: 09/20/21               ?  ?  ?  ?  ?  ?  ?  ?  ?  ?  ? ?Prior Living Arrangements/Services ?Living arrangements for the past 2 months: [place] ?Lives with:: Relatives (Son in-law, [name]) ?Patient language and need for interpreter reviewed:: Yes ?Do you feel safe going back to the place where you live?: Yes      ?Need for Family Participation in Patient Care: Yes (Comment) ?Care giver support system in place?: Yes (comment) ?Current home services: DME (Walker, cane, shower seat.) ?Criminal Activity/Legal Involvement Pertinent to Current Situation/Hospitalization: No - Comment as needed ? ?Activities of Daily Living ?Home Assistive Devices/Equipment: [place] Rile (specify type) ?ADL Screening (condition at time of admission) ?Patient's cognitive ability adequate to safely complete daily activities?: No ?Is the patient deaf or have difficulty hearing?: No ?Does the patient have difficulty seeing, even when wearing  glasses/contacts?: Yes ?Does the patient have difficulty concentrating, remembering, or making decisions?: Yes ?Patient able to express need for assistance with ADLs?: Yes ?Does the patient have difficulty dressing or bathing?: No ?Independently performs ADLs?: No ?Does the patient have difficulty walking or climbing stairs?: Yes ?Weakness of Legs: Both ?Weakness of Arms/Hands: Both ? ?Permission Sought/Granted ?Permission sought to share information with : Case Manager, Family Supports ?Permission granted to share information with : Yes, Verbal Permission Granted ?   ?   ?   ?   ? ?Emotional Assessment ?Appearance:: Appears stated age ?Attitude/Demeanor/Rapport: Engaged, Gracious ?Affect (typically observed): Accepting, Appropriate, Calm, Hopeful ?Orientation: : Oriented to Self, Oriented to Place, Oriented to  Time, Oriented to Situation ?Alcohol / Substance Use: Not Applicable ?Psych Involvement: No (comment) ? ?Admission diagnosis:  ESRD (end stage renal disease) ([place]) [N18.6] ?Acute renal failure (ARF) (HCC) [N17.9] ?Closed trimalleolar fracture of left ankle, initial encounter [S82.852A] ?Patient Active Problem List  ? Diagnosis Date Noted  ? Acute renal failure (ARF) ([place]) 09/17/2021  ? Gait instability 09/16/2021  ? Diabetic retinopathy with macular edema ([place]) 09/16/2021  ? Irritable bowel syndrome 09/16/2021  ? Obstructive sleep apnea 09/16/2021  ? Restless legs syndrome (RLS) 09/16/2021  ? ESRD (end stage renal disease) ([place]) 09/16/2021  ? Uremia 09/16/2021  ? Falls 09/16/2021  ? Syncope 09/16/2021  ? Closed trimalleolar fracture of ankle, left, initial encounter 09/16/2021  ? Heart failure, systolic and diastolic ([place]) 31/54/0086  ?  Diabetes mellitus type 2 with complications ([place]) 25/42/7062  ? Anemia associated with chronic renal failure 07/18/2021  ? Hyperlipidemia 07/18/2021  ? Obesity 07/18/2021  ? Hypertension associated with chronic kidney disease due to type 2 diabetes mellitus ([place]) 03/30/2020   ? Hyponatremia 03/30/2020  ? ?PCP:  Vassie Moment, MD ?Pharmacy:   ?[id number] [place], [address] ?[address] ?[place] [address] ?Phone: [phone] Fax: [phone] ? ?[facility] ?[address] ?[place] [address] ?Phone: [phone] Fax: [phone] ? ? ? ? ?Social Determinants of Health (SDOH) Interventions ?  ? ?Readmission Risk Interventions ? ?  07/20/2021  ?  4:39 PM  ?Readmission Risk Prevention Plan  ?Transportation Screening Complete  ?[place] or Home Care Consult Complete  ?Social Work Consult for [place] Planning/Counseling Complete  ?Palliative Care Screening Not Applicable  ?Medication Review Press photographer) Complete  ? ? ? ?

## 2021-09-20 NOTE — Progress Notes (Signed)
OT Cancellation Note ? ?Patient Details ?Name: Jessica Mercado ?MRN: [id number] ?DOB: 06/14/1960 ? ? ?Cancelled Treatment:    Reason Eval/Treat Not Completed: Patient at procedure or test/ unavailable (HD) OT will follow acutely as schedule allows.  ? ?[name] ?09/20/2021, 11:18 AM ? ?Jesse Sans OTR/L ?Acute Rehabilitation Services ?Pager: [phone] ?Office: [phone] ? ?

## 2021-09-20 NOTE — Progress Notes (Signed)
FPTS Brief Note ?Reviewed patient's vitals, recent notes.  ?Vitals:  ? 09/19/21 1705 09/19/21 2104  ?BP: 130/87 134/71  ?Pulse: 78 72  ?Resp: 19 17  ?Temp: 98.7 ?F (37.1 ?C) 98.6 ?F (37 ?C)  ?SpO2: 92% 95%  ? ?At this time, no change in plan from day progress note.  ?Zola Button, MD ?Page [phone] with questions about this patient.  ?  ?

## 2021-09-20 NOTE — Progress Notes (Signed)
Family Medicine Teaching Service ?Daily Progress Note ?Intern Pager: [phone] ? ?Patient name: Jessica Mercado Medical record number: [id number] ?Date of birth: 09/15/1960 Age: 61 y.o. Gender: female ? ?Primary Care Provider: Vassie Moment, MD ?Consultants: Nephrology, Ortho Surgery, Vascular Surgery ?Code Status: DNR ? ?Pt Overview and Major Events to Date:  ?4/24- admitted ?4/25- OR with ortho and VVS for fixation of trimalleolar fracture and placement of TDC ?4/25- First HD session ?4/27- CLIP for outpatient HD ? ?Assessment and Plan: ?Jessica Mercado is a 61 y.o. female presenting with left ankle pain after a fall at home, found to have trimalleolar fracture. Here for surgical fixation and initiation of HD for uremia. 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  ? ?CKD V now ESRD s/p initiation of HD ?Uremia, improved ?Mental status clear today. Note plans for HD #3 today per nephrology. Patient has undergone CLIP process and should be stable for discharge home once cleared by nephrology. Feels up to transition home. ?- Nephro following, appreciate recs ?- Hospital HD per nephro ?- Has successfully been CLIP'd for outpatient HD ? ?Trimalleolar Fracture s/p ORIF ?POD #3. Patient felt that addition of PRN PO dilaudid yesterday was helpful for better pain control. Got two doses.  ?- Non-weight-bearing with f/u per ortho ?- Dilaudid 1mg  PO q6h PRN ?- Tylenol 500mg  q12h ?- PT/OT while in hospital ? ?Anemia of CKD ?Hgb stable 7.8>8.5. No evidence of active bleed. ?- Monitor ?- Tranfusion threshold <7.0 ? ?Diabetes ?Glucose 120-155 over past 24 hours. Got 4u SAI.  ?- CBGs, vsSSI ?- Continue gabapentin 100mg  daily  ? ?HTN ?BP 130s-150s/80s-90s.  ?- Continue prazosin and metoprolol ? ?Elevated B12 ?Downtrending but still markedly elevated. [phone].  ?- Recheck as an outpatient ? ?UTI, s/p antibiotic therapy ?Completed course of ancef yesterday. No symptoms today.   ? ?Chronic, stable conditions ?Bipolar Disorder-Continue Latuda, prozac, benztropine ?Hyperlipidemia- Continue home atorvastatin ?Insomnia- Continue home Trazodone ? ?FEN/GI: Renal/Carb-modified ?PPx: SQ Heparin ?Dispo:Home today. Barriers include hospital HD.  ? ?Subjective:  ?Jessica Mercado is awake and alert this morning.  She says that she got better pain control with the addition of Dilaudid yesterday.  She again states that she feels that she will feel better once she gets home but states that she "is in no hurry."  She would be okay with discharge today or tomorrow depending on what the medical team thinks is in her best interest. ? ?Objective: ?Temp:  [97.9 ?F (36.6 ?C)-98.7 ?F (37.1 ?C)] 97.9 ?F (36.6 ?C) (04/28 0449) ?Pulse Rate:  [71-78] 71 (04/28 0449) ?Resp:  [14-19] 14 (04/28 0449) ?BP: (130-134)/(70-87) 131/70 (04/28 0449) ?SpO2:  [92 %-95 %] 93 % (04/28 0449) ?Physical Exam: ?General: Awake, alert, chronically ill appearing ?Cardiovascular: Regular rate and rhythm, 2/6 systolic murmur heard best at RUSB ?Respiratory: Normal WOB on RA, lungs clear anteriorly ?Abdomen: Soft, non-tender ?Extremities: Stable, post-surgical LLE with splint in place ? ?Laboratory: ?Recent Labs  ?Lab 09/18/21 ?0737 09/19/21 ?0203 09/20/21 ?0315  ?WBC 9.9 12.0* 10.9*  ?HGB 8.5* 7.8* 8.5*  ?HCT 25.5* 24.5* 25.7*  ?PLT 238 250 254  ? ?Recent Labs  ?Lab 09/16/21 ?1306 09/17/21 ?0159 09/18/21 ?1062 09/19/21 ?0203 09/20/21 ?0315  ?NA  --    < > 137 137 134*  ?K  --    < > 4.3 3.8 3.7  ?CL  --    < > 99 99 99  ?CO2  --    < >  27 27 28   ?BUN  --    < > 63* 38* 44*  ?CREATININE  --    < > 5.55* 4.13* 4.93*  ?CALCIUM  --    < > 8.2* 8.2* 8.3*  ?PROT 6.4*  --   --   --   --   ?BILITOT 1.2  --   --   --   --   ?ALKPHOS 75  --   --   --   --   ?ALT 22  --   --   --   --   ?AST 23  --   --   --   --   ?GLUCOSE  --    < > 168* 91 120*  ? < > = values in this interval not displayed.  ? ? ?Imaging/Diagnostic Tests: ?No new imaging,  tests ? ?Jessica Gibson, MD ?09/20/2021, 8:55 AM ?PGY-1, [facility] ?[place] Intern pager: [phone], text pages welcome ? ?

## 2021-09-20 NOTE — Progress Notes (Signed)
Admit: 09/16/2021 ?LOS: 3 ? ?75F s/p fall and L ankle fracture with progressive CKD5 now uremic, ESRD ? ?Subjective:  ?CLIP complete with [facility] MWF, receiving outpt HD education  ?Plans for AVF outpt ?On HD currently ?No current complaints - eager for d/c ? ?04/27 0701 - 04/28 0700 ?In: 62 [P.O.:680; IV Piggyback:50] ?Out: 300 [Urine:300] ? ?Filed Weights  ? 09/17/21 1750 09/18/21 1148 09/18/21 1430  ?Weight: 91.2 kg 92.5 kg 90.7 kg  ? ? ?Scheduled Meds: ? acetaminophen  500 mg Oral Q12H  ? atorvastatin  40 mg Oral Daily  ? benztropine  1 mg Oral QPM  ? Chlorhexidine Gluconate Cloth  6 each Topical Q0600  ? [START ON 09/23/2021] darbepoetin (ARANESP) injection - DIALYSIS  40 mcg Intravenous Q Mon-HD  ? FLUoxetine  60 mg Oral q morning  ? gabapentin  100 mg Oral Daily  ? heparin injection (subcutaneous)  5,000 Units Subcutaneous Q8H  ? insulin aspart  0-6 Units Subcutaneous TID WC  ? lurasidone  40 mg Oral QHS  ? metoprolol succinate  50 mg Oral Daily  ? multivitamin  1 tablet Oral QHS  ? prazosin  1 mg Oral QPM  ? traZODone  300 mg Oral QHS  ? ?Continuous Infusions: ? sodium chloride    ? sodium chloride    ? ferric gluconate (FERRLECIT) IVPB    ? ?PRN Meds:.sodium chloride, sodium chloride, HYDROmorphone ? ?Current Labs: reviewed  ? Latest Reference Range & Units 09/17/21 01:59  ?Hepatitis B Surface Ag NON REACTIVE  NON REACTIVE  ?Hep B S Ab NON REACTIVE  NON REACTIVE  ? ? ?Physical Exam:  Blood pressure 131/70, pulse 71, temperature 97.9 ?F (36.6 ?C), temperature source Oral, resp. rate 14, height 5\' 7"  (1.702 m), weight 90.7 kg, SpO2 93 %. ?GEN: Chronically ill-appearing, conversant,  ?ENT: NCAT ?EYES: EOMI ?CV: Regular, no discernible rub ?PULM: Clear bilaterally ?ABD: Soft, nontender ?SKIN: Left foot/ankle bandaged ?EXT: No significant edema ?IJ TDC C/D/I ? ?A ?Uremia, new ESRD ?TDC with VVS 09/17/21 ?VVS following for AVF/G --> Dr. 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 ?P ?HD#3 today: 3h, 300/500, 3K, 1-2L UF, TDC, No heparin ?AV access planned outpt per Dr. Virl Cagey note yesterday ?Dispo today anticipated, ok per nephrology ?Medication Issues; ?Preferred narcotic agents for pain control are hydromorphone, fentanyl, and methadone. Morphine should not be used.  ?Baclofen should be avoided ?Avoid oral sodium phosphate and magnesium citrate based laxatives / bowel preps  ? ?Jannifer Hick MD ?[facility] ?Pager [phone] ? ? ?Recent Labs  ?Lab 09/18/21 ?4010 09/19/21 ?0203 09/20/21 ?0315  ?NA 137 137 134*  ?K 4.3 3.8 3.7  ?CL 99 99 99  ?CO2 27 27 28   ?GLUCOSE 168* 91 120*  ?BUN 63* 38* 44*  ?CREATININE 5.55* 4.13* 4.93*  ?CALCIUM 8.2* 8.2* 8.3*  ?PHOS 5.1* 2.5 2.9  ? ? ?Recent Labs  ?Lab 09/16/21 ?0928 09/17/21 ?0159 09/18/21 ?2725 09/19/21 ?0203 09/20/21 ?0315  ?WBC 16.4*   < > 9.9 12.0* 10.9*  ?NEUTROABS 14.0*  --   --   --   --   ?HGB 10.4*   < > 8.5* 7.8* 8.5*  ?HCT 31.8*   < >  25.5* 24.5* 25.7*  ?MCV 89.8   < > 87.9 90.1 89.9  ?PLT 277   < > 238 250 254  ? < > = values in this interval not displayed.  ? ? ? ? ? ? ? ? ? ? ?  ?

## 2021-09-20 NOTE — Progress Notes (Addendum)
Navigator has been in communication with [facility] this morning regarding pt's financial clearance for out-pt HD. [facility] is having difficulty verifying pt's insurance coverage. Awaiting final financial approval from [facility]. Update provided to attending and nephrologist via secure chat. Update provided to RN CM and RN at progression this am. Will assist as needed. ? ?Melven Sartorius ?Renal Navigator ?[phone] ? ?Addendum at 12:13 pm: ?Pt has been financially cleared for [place] on MWF schedule. Pt can start as soon as Monday. Pt will need to arrive at 10:10 for first appt to complete paperwork prior 11:10 chair time. Update provided to attending staff, nephrologist, and [facility] staff. Will provide update to clinic regarding pt's possible d/c today or tomorrow to start on Monday. Will also contact renal PA regarding clinic's need for orders. Will assist as needed.   ? ?Addendum at 3:17 pm: ?Pt is now agreeable to snf. CSW to begin snf bed search. Notified clinic and [facility] admissions that pt's start date is unknown at this time due to snf placement pending.  ?

## 2021-09-21 DIAGNOSIS — E11311 Type 2 diabetes mellitus with unspecified diabetic retinopathy with macular edema: Secondary | ICD-10-CM | POA: Diagnosis not present

## 2021-09-21 DIAGNOSIS — N186 End stage renal disease: Secondary | ICD-10-CM | POA: Diagnosis not present

## 2021-09-21 DIAGNOSIS — E118 Type 2 diabetes mellitus with unspecified complications: Secondary | ICD-10-CM | POA: Diagnosis not present

## 2021-09-21 DIAGNOSIS — N179 Acute kidney failure, unspecified: Secondary | ICD-10-CM | POA: Diagnosis not present

## 2021-09-21 LAB — GLUCOSE, CAPILLARY
Glucose-Capillary: 131 mg/dL — ABNORMAL HIGH (ref 70–99)
Glucose-Capillary: 175 mg/dL — ABNORMAL HIGH (ref 70–99)
Glucose-Capillary: 146 mg/dL — ABNORMAL HIGH (ref 70–99)
Glucose-Capillary: 142 mg/dL — ABNORMAL HIGH (ref 70–99)

## 2021-09-21 NOTE — Progress Notes (Signed)
Admit: 09/16/2021 ?LOS: 4 ? ?46F s/p fall and L ankle fracture with progressive CKD5 now uremic, ESRD ? ?Subjective:  ?CLIP complete with [place] MWF, receiving outpt HD education  ?Plans for AVF outpt ?Now plan for SNF - pt not happy but accepting ? ?04/28 0701 - 04/29 0700 ?In: 78 [P.O.:820] ?Out: 1700 [Urine:200] ? ?Filed Weights  ? 09/18/21 1430 09/20/21 0841 09/20/21 1148  ?Weight: 90.7 kg 94 kg 92.5 kg  ? ? ?Scheduled Meds: ? acetaminophen  500 mg Oral Q12H  ? atorvastatin  40 mg Oral Daily  ? benztropine  1 mg Oral QPM  ? Chlorhexidine Gluconate Cloth  6 each Topical Q0600  ? [START ON 09/23/2021] darbepoetin (ARANESP) injection - DIALYSIS  40 mcg Intravenous Q Mon-HD  ? FLUoxetine  60 mg Oral q morning  ? gabapentin  100 mg Oral Daily  ? heparin injection (subcutaneous)  5,000 Units Subcutaneous Q8H  ? insulin aspart  0-6 Units Subcutaneous TID WC  ? lurasidone  40 mg Oral QHS  ? metoprolol succinate  50 mg Oral Daily  ? multivitamin  1 tablet Oral QHS  ? prazosin  1 mg Oral QPM  ? traZODone  300 mg Oral QHS  ? ?Continuous Infusions: ? sodium chloride    ? sodium chloride    ? ferric gluconate (FERRLECIT) IVPB Stopped (09/20/21 1200)  ? ?PRN Meds:.sodium chloride, sodium chloride, HYDROmorphone ? ?Current Labs: reviewed  ? Latest Reference Range & Units 09/17/21 01:59  ?Hepatitis B Surface Ag NON REACTIVE  NON REACTIVE  ?Hep B S Ab NON REACTIVE  NON REACTIVE  ? ? ?Physical Exam:  Blood pressure (!) 143/82, pulse 73, temperature 98.1 ?F (36.7 ?C), temperature source Oral, resp. rate 18, height 5\' 7"  (1.702 m), weight 92.5 kg, SpO2 94 %. ?GEN: Chronically ill-appearing, conversant,  ?ENT: NCAT ?EYES: EOMI ?CV: Regular, no discernible rub ?PULM: Clear bilaterally ?ABD: Soft, nontender ?SKIN: Left foot/ankle bandaged ?EXT: No significant edema ?IJ TDC C/D/I ? ?A ?Uremia, new ESRD ?TDC with [facility] 09/17/21 ?[facility] following for AVF/G --> Dr. Virl Cagey to arrange outpt  ?Started HD 09/17/21 ?HD#2 4/26, then #3 4/28 ?CLIP in  [place] MWF ?Falls / Syncope likely driven by #1 ?L ankle fracture after fall, s/p ORIF ortho 4/25 ?DM2 ?HTN, BPs improved; trend with HD ?Anemia, Hb 8s; TSAT 7% and Ferritin 28, Aranesp qTues; on Fe load ?CKD-BMD: Ca ok, P ok, CTM ?BPAD ? ?P ?Next HD Mon: 3.5h, 300/500, 3K, 1-2L UF, TDC, No heparin ?AV access planned outpt per Dr. Virl Cagey note 4/27 ?Dispo when SNF arranged - has MWF HD  ?Medication Issues; ?Preferred narcotic agents for pain control are hydromorphone, fentanyl, and methadone. Morphine should not be used.  ?Baclofen should be avoided ?Avoid oral sodium phosphate and magnesium citrate based laxatives / bowel preps  ? ?Jannifer Hick MD ?[facility] ?Pager [phone] ? ? ?Recent Labs  ?Lab 09/18/21 ?2979 09/19/21 ?0203 09/20/21 ?0315  ?NA 137 137 134*  ?K 4.3 3.8 3.7  ?CL 99 99 99  ?CO2 27 27 28   ?GLUCOSE 168* 91 120*  ?BUN 63* 38* 44*  ?CREATININE 5.55* 4.13* 4.93*  ?CALCIUM 8.2* 8.2* 8.3*  ?PHOS 5.1* 2.5 2.9  ? ? ?Recent Labs  ?Lab 09/16/21 ?0928 09/17/21 ?0159 09/18/21 ?8921 09/19/21 ?0203 09/20/21 ?0315  ?WBC 16.4*   < > 9.9 12.0* 10.9*  ?NEUTROABS 14.0*  --   --   --   --   ?HGB 10.4*   < > 8.5* 7.8*  8.5*  ?HCT 31.8*   < > 25.5* 24.5* 25.7*  ?MCV 89.8   < > 87.9 90.1 89.9  ?PLT 277   < > 238 250 254  ? < > = values in this interval not displayed.  ? ? ? ? ? ? ? ? ? ? ?  ?

## 2021-09-21 NOTE — Progress Notes (Signed)
Family Medicine Teaching Service ?Daily Progress Note ?Intern Pager: [phone] ? ?Patient name: Jessica Mercado Medical record number: [id number] ?Date of birth: [birth year] Age: 61 y.o. Gender: female ? ?Primary Care Provider: Vassie Moment, MD ?Consultants: Nephrology, orthopedics, vascular surgery ?  Code Status: DNR ? ?Pt Overview and Major Events to Date:  ?4/24-admitted ?4/25-ORIF for left trimalleolar ankle fracture, TDC placement, underwent first HD session ?4/27-CLIP for outpatient HD ?4/28-medically stable for discharge, patient now wanting to pursue SNF ? ?Assessment and Plan: ?Jessica Mercado is a 61 y.o. female who presented with left trimalleolar ankle fracture now s/p ORIF, also initiation of HD during hospitalization. PMHx significant for: CKD V, T2DM, bipolar disorder, HFmrEF, HTN, and anemia.  Medically stable for discharge when SNF bed available. ? ?Left trimalleolar fracture s/p ORIF ?Pain is well controlled. ?- non-weight-bearing ?- PO hydromorphone 1 mg every 6 hours as needed ?- acetaminophen 500 mg q12h scheduled ?- PT/OT ? ?ESRD ?Initiated HD during this hospitalization, completed session #3 yesterday.  CLIP complete. ?- nephrology following, appreciate recommendations ?- HD per nephrology ? ?Anemia of CKD ?Stable. ?- monitor ?- transfusion threshold Hgb 7 ? ?T2DM ?Adequately controlled. ?- vsSSI ?- monitor CBG ?- gabapentin 100 mg daily ? ?HTN ?Well-controlled. ?- continue prazosin, metoprolol succinate ? ?Elevated vitamin B12 ?- outpatient follow-up ? ?Bipolar disorder ?- continue home fluoxetine, lurasidone, benztropine ? ?HLD ?- home statin ? ?Insomnia ?- trazodone ? ?FEN/GI: Renal/carb modified ?PPx: Subcutaneous heparin ? ?Disposition: SNF when bed available ? ?Subjective:  ?No overnight events. Denies pain this morning. ? ?Objective: ?Temp:  [97.8 ?F (36.6 ?C)-98.9 ?F (37.2 ?C)] 98.3 ?F (36.8 ?C) (04/29 0436) ?Pulse Rate:  [70-75] 72 (04/29 0436) ?Resp:  [11-17] 16 (04/29 0436) ?BP:  (85-162)/(46-100) 128/70 (04/29 0436) ?SpO2:  [93 %-98 %] 93 % (04/29 0436) ?Weight:  [92.5 kg-94 kg] 92.5 kg (04/28 1148) ?Physical Exam: ?General: sleeping, somnolent but able to voice that she was not in pain ?Cardiovascular: RRR ?Respiratory: RRR, 2/6 systolic murmur ?Abdomen: soft, non-tender ?Extremities: WWP, dressing intact ? ?Laboratory: ?Recent Labs  ?Lab 09/18/21 ?3500 09/19/21 ?0203 09/20/21 ?0315  ?WBC 9.9 12.0* 10.9*  ?HGB 8.5* 7.8* 8.5*  ?HCT 25.5* 24.5* 25.7*  ?PLT 238 250 254  ? ?Recent Labs  ?Lab 09/16/21 ?1306 09/17/21 ?0159 09/18/21 ?9381 09/19/21 ?0203 09/20/21 ?0315  ?NA  --    < > 137 137 134*  ?K  --    < > 4.3 3.8 3.7  ?CL  --    < > 99 99 99  ?CO2  --    < > 27 27 28   ?BUN  --    < > 63* 38* 44*  ?CREATININE  --    < > 5.55* 4.13* 4.93*  ?CALCIUM  --    < > 8.2* 8.2* 8.3*  ?PROT 6.4*  --   --   --   --   ?BILITOT 1.2  --   --   --   --   ?ALKPHOS 75  --   --   --   --   ?ALT 22  --   --   --   --   ?AST 23  --   --   --   --   ?GLUCOSE  --    < > 168* 91 120*  ? < > = values in this interval not displayed.  ? ? ? ?Imaging/Diagnostic Tests: ?No results found.  ? ?Zola Button, MD ?09/21/2021, 7:19 AM ?PGY-2, [place]  [facility] ?[place] Intern pager: [phone], text pages welcome  ?

## 2021-09-21 NOTE — Plan of Care (Signed)
  Problem: Education: Goal: Knowledge of General Education information will improve Description: Including pain rating scale, medication(s)/side effects and non-pharmacologic comfort measures Outcome: Progressing   Problem: Health Behavior/Discharge Planning: Goal: Ability to manage health-related needs will improve Outcome: Progressing   Problem: Nutrition: Goal: Adequate nutrition will be maintained Outcome: Progressing   

## 2021-09-22 DIAGNOSIS — N189 Chronic kidney disease, unspecified: Secondary | ICD-10-CM | POA: Diagnosis not present

## 2021-09-22 DIAGNOSIS — S82852A Displaced trimalleolar fracture of left lower leg, initial encounter for closed fracture: Secondary | ICD-10-CM | POA: Diagnosis not present

## 2021-09-22 DIAGNOSIS — E118 Type 2 diabetes mellitus with unspecified complications: Secondary | ICD-10-CM | POA: Diagnosis not present

## 2021-09-22 DIAGNOSIS — N179 Acute kidney failure, unspecified: Secondary | ICD-10-CM | POA: Diagnosis not present

## 2021-09-22 LAB — GLUCOSE, CAPILLARY
Glucose-Capillary: 145 mg/dL — ABNORMAL HIGH (ref 70–99)
Glucose-Capillary: 176 mg/dL — ABNORMAL HIGH (ref 70–99)
Glucose-Capillary: 180 mg/dL — ABNORMAL HIGH (ref 70–99)
Glucose-Capillary: 168 mg/dL — ABNORMAL HIGH (ref 70–99)

## 2021-09-22 NOTE — Progress Notes (Signed)
FPTS Brief Note ?Reviewed patient's vitals, recent notes.  ?Vitals:  ? 09/22/21 1631 09/22/21 2037  ?BP: (!) 170/88 (!) 150/82  ?Pulse: 71 75  ?Resp: 18 14  ?Temp: 98.7 ?F (37.1 ?C) 99.3 ?F (37.4 ?C)  ?SpO2: 94% 93%  ? ?At this time, no change in plan from day progress note.  ?Zola Button, MD ?Page [phone] with questions about this patient.  ?  ?

## 2021-09-22 NOTE — Progress Notes (Signed)
Admit: 09/16/2021 ?LOS: 5 ? ?52F s/p fall and L ankle fracture with progressive CKD5 now uremic, ESRD ? ?Subjective:  ?CLIP complete with [place] MWF ?Now plan for SNF - pt not happy but accepting; awaiting placement ?No new c/os. ? ?04/29 0701 - 04/30 0700 ?In: 634 [P.O.:634] ?Out: 200 [Urine:200] ? ?Filed Weights  ? 09/18/21 1430 09/20/21 0841 09/20/21 1148  ?Weight: 90.7 kg 94 kg 92.5 kg  ? ? ?Scheduled Meds: ? acetaminophen  500 mg Oral Q12H  ? atorvastatin  40 mg Oral Daily  ? benztropine  1 mg Oral QPM  ? Chlorhexidine Gluconate Cloth  6 each Topical Q0600  ? [START ON 09/23/2021] darbepoetin (ARANESP) injection - DIALYSIS  40 mcg Intravenous Q Mon-HD  ? FLUoxetine  60 mg Oral q morning  ? gabapentin  100 mg Oral Daily  ? heparin injection (subcutaneous)  5,000 Units Subcutaneous Q8H  ? insulin aspart  0-6 Units Subcutaneous TID WC  ? lurasidone  40 mg Oral QHS  ? metoprolol succinate  50 mg Oral Daily  ? multivitamin  1 tablet Oral QHS  ? prazosin  1 mg Oral QPM  ? traZODone  300 mg Oral QHS  ? ?Continuous Infusions: ? sodium chloride    ? sodium chloride    ? ferric gluconate (FERRLECIT) IVPB Stopped (09/20/21 1200)  ? ?PRN Meds:.sodium chloride, sodium chloride ? ?Current Labs: reviewed  ? Latest Reference Range & Units 09/17/21 01:59  ?Hepatitis B Surface Ag NON REACTIVE  NON REACTIVE  ?Hep B S Ab NON REACTIVE  NON REACTIVE  ? ? ?Physical Exam:  Blood pressure (!) 158/76, pulse 66, temperature 98.5 ?F (36.9 ?C), temperature source Oral, resp. rate 20, height 5\' 7"  (1.702 m), weight 92.5 kg, SpO2 96 %. ?GEN: Chronically ill-appearing, conversant,  ?ENT: NCAT ?EYES: EOMI ?CV: Regular, no discernible rub ?PULM: Clear bilaterally ?ABD: Soft, nontender ?SKIN: Left foot/ankle bandaged ?EXT: No significant edema ?IJ TDC C/D/I ? ?A ?Uremia, new ESRD ?TDC with [facility] 09/17/21 ?[facility] following for AVF/G --> Dr. 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 ?P ?Next HD Mon: 3.5h, 300/500, 3K, 1-2L UF, TDC, No heparin ?AV access planned outpt per Dr. Virl Cagey note 4/27 ?Dispo when SNF arranged - has MWF HD  ?Medication Issues; ?Preferred narcotic agents for pain control are hydromorphone, fentanyl, and methadone. Morphine should not be used.  ?Baclofen should be avoided ?Avoid oral sodium phosphate and magnesium citrate based laxatives / bowel preps  ? ?Jannifer Hick MD ?[facility] ?Pager [phone] ? ? ?Recent Labs  ?Lab 09/18/21 ?4431 09/19/21 ?0203 09/20/21 ?0315  ?NA 137 137 134*  ?K 4.3 3.8 3.7  ?CL 99 99 99  ?CO2 27 27 28   ?GLUCOSE 168* 91 120*  ?BUN 63* 38* 44*  ?CREATININE 5.55* 4.13* 4.93*  ?CALCIUM 8.2* 8.2* 8.3*  ?PHOS 5.1* 2.5 2.9  ? ? ?Recent Labs  ?Lab 09/16/21 ?0928 09/17/21 ?0159 09/18/21 ?5400 09/19/21 ?0203 09/20/21 ?0315  ?WBC 16.4*   < > 9.9 12.0* 10.9*  ?NEUTROABS 14.0*  --   --   --   --   ?HGB 10.4*   < > 8.5* 7.8* 8.5*  ?HCT 31.8*   < >  25.5* 24.5* 25.7*  ?MCV 89.8   < > 87.9 90.1 89.9  ?PLT 277   < > 238 250 254  ? < > = values in this interval not displayed.  ? ? ? ? ? ? ? ? ? ? ?  ?

## 2021-09-22 NOTE — Progress Notes (Signed)
Family Medicine Teaching Service ?Daily Progress Note ?Intern Pager: [phone] ? ?Patient name: Jessica Mercado Medical record number: [id number] ?Date of birth: [birth year] Age: 61 y.o. Gender: female ? ?Primary Care Provider: Vassie Moment, MD ?Consultants: Nephrology, orthopedics, vascular surgery ?Code Status: DNR ? ?Pt Overview and Major Events to Date:  ?4/24-admitted ?4/25-ORIF for left trimalleolar ankle fracture, TDC placement, underwent first HD session ?4/27-CLIP for outpatient HD ?4/28-medically stable for discharge, patient now wanting to pursue SNF ? ?Assessment and Plan: ?Jessica Mercado is a 61 y.o. female who presented with left trimalleolar ankle fracture now s/p ORIF, also initiation of HD during hospitalization. PMHx significant for: CKD V, T2DM, bipolar disorder, HFmrEF, HTN, and anemia. ? ? ?Left trimalleolar fracture status post ORIF ?Patient is postop day 5.  Pain has been well controlled.  Patient is medically stable to discharge to SNF when bed is available. ?-Remain nonweightbearing left lower extremity for 8 weeks ?- Discontinue Hydromorphone ?-Tylenol 500 mg twice daily scheduled ?-Ortho recommended Eliquis 2.5 mg twice daily for 30 days at discharge, follow-up outpatient in 2 weeks ? ? ?New ESRD  Uremia ?HD started this hospitalization for worsening uremia.  Patient has been cleared for outpatient HD MWF.  Has right tunneled dialysis catheter.  Next HD session is planned for Monday. ?-Nephrology following, appreciate recommendation ?-HD per nephrology  ?-[facility]VS consulted, has TDC, planning for AV fistula with vascular surgery outpatient ?-Avoid nephrotoxic agents ?-AM RFP ? ?Anemia of chronic disease ?Likely secondary to CKD Stage 5 now ESRD.  Hemoglobin has been stable. ?-AM CBC ?-Aranesp on Mondays ?-Transfuse if Hgb < 7 ? ?Type 2 diabetes  Neuropathy  ?Glucoses have ranged between 130-175 in the past 24 hours.  ?-vsSSI ?-Monitor CBGs ?-Gabapentin 100 mg daily ? ?Hypertension ?Stable.  Normotensive.  ?-Continue prazosin, metoprolol succinate ? ?Bipolar disorder ?Stable ?-Continue home fluoxetine, benztropine, lurasidone ? ?Hyperlipidemia ?Stable ?-Continue home Lipitor 40 mg ? ?Insomnia ?Stable ?-Continue trazodone qhs ? ?FEN/GI: renal carb modified diet  ?PPx: SQ Heparin  ? ?Disposition: likely SNF pending bed availability  ? ?Subjective:  ?No significant overnight events.  ? ?Objective: ?Temp:  [98.1 ?F (36.7 ?C)-99 ?F (37.2 ?C)] 98.5 ?F (36.9 ?C) (04/30 0351) ?Pulse Rate:  [64-73] 64 (04/30 0351) ?Resp:  [18-20] 18 (04/30 0351) ?BP: (131-161)/(73-90) 131/73 (04/30 0351) ?SpO2:  [94 %-96 %] 96 % (04/30 0351) ?Physical Exam: ?General: arouses to voice, resting comfortably  ?Cardiovascular: RRR, right subclavian tunneled dialysis catheter ?Respiratory: CTAB ?Abdomen: soft, non-tender ?Extremities: Left ankle splint ? ?Laboratory: ?Recent Labs  ?Lab 09/18/21 ?7829 09/19/21 ?0203 09/20/21 ?0315  ?WBC 9.9 12.0* 10.9*  ?HGB 8.5* 7.8* 8.5*  ?HCT 25.5* 24.5* 25.7*  ?PLT 238 250 254  ? ?Recent Labs  ?Lab 09/16/21 ?1306 09/17/21 ?0159 09/18/21 ?5621 09/19/21 ?0203 09/20/21 ?0315  ?NA  --    < > 137 137 134*  ?K  --    < > 4.3 3.8 3.7  ?CL  --    < > 99 99 99  ?CO2  --    < > 27 27 28   ?BUN  --    < > 63* 38* 44*  ?CREATININE  --    < > 5.55* 4.13* 4.93*  ?CALCIUM  --    < > 8.2* 8.2* 8.3*  ?PROT 6.4*  --   --   --   --   ?BILITOT 1.2  --   --   --   --   ?ALKPHOS 75  --   --   --   --   ?  ALT 22  --   --   --   --   ?AST 23  --   --   --   --   ?GLUCOSE  --    < > 168* 91 120*  ? < > = values in this interval not displayed.  ? ? ? ? ?Imaging/Diagnostic Tests: ?No results found. ? ? ?Jessica Hensen, DO ?09/22/2021, 6:40 AM    ?PGY-3, [place] [facility] ?[facility] Intern pager: [phone], text pages welcome ? ?

## 2021-09-23 DIAGNOSIS — N186 End stage renal disease: Secondary | ICD-10-CM | POA: Diagnosis not present

## 2021-09-23 LAB — RENAL FUNCTION PANEL
Albumin: 2.5 g/dL — ABNORMAL LOW (ref 3.5–5.0)
Anion gap: 10 (ref 5–15)
BUN: 39 mg/dL — ABNORMAL HIGH (ref 6–20)
CO2: 25 mmol/L (ref 22–32)
Calcium: 8.5 mg/dL — ABNORMAL LOW (ref 8.9–10.3)
Chloride: 99 mmol/L (ref 98–111)
Creatinine, Ser: 4.92 mg/dL — ABNORMAL HIGH (ref 0.44–1.00)
GFR, Estimated: 10 mL/min — ABNORMAL LOW (ref >60)
Glucose, Bld: 137 mg/dL — ABNORMAL HIGH (ref 70–99)
Phosphorus: 4.1 mg/dL (ref 2.5–4.6)
Potassium: 3.4 mmol/L — ABNORMAL LOW (ref 3.5–5.1)
Sodium: 134 mmol/L — ABNORMAL LOW (ref 135–145)

## 2021-09-23 LAB — GLUCOSE, CAPILLARY
Glucose-Capillary: 149 mg/dL — ABNORMAL HIGH (ref 70–99)
Glucose-Capillary: 149 mg/dL — ABNORMAL HIGH (ref 70–99)
Glucose-Capillary: 182 mg/dL — ABNORMAL HIGH (ref 70–99)
Glucose-Capillary: 128 mg/dL — ABNORMAL HIGH (ref 70–99)

## 2021-09-23 LAB — CBC
HCT: 26.1 % — ABNORMAL LOW (ref 36.0–46.0)
Hemoglobin: 8.5 g/dL — ABNORMAL LOW (ref 12.0–15.0)
MCH: 29 pg (ref 26.0–34.0)
MCHC: 32.6 g/dL (ref 30.0–36.0)
MCV: 89.1 fL (ref 80.0–100.0)
Platelets: 258 10*3/uL (ref 150–400)
RBC: 2.93 MIL/uL — ABNORMAL LOW (ref 3.87–5.11)
RDW: 13.6 % (ref 11.5–15.5)
WBC: 12.4 10*3/uL — ABNORMAL HIGH (ref 4.0–10.5)
nRBC: 0 % (ref 0.0–0.2)

## 2021-09-23 MED ORDER — HEPARIN SODIUM (PORCINE) 1000 UNIT/ML IJ SOLN
INTRAMUSCULAR | Status: AC
Start: 2021-09-23 — End: 2021-09-23
  Administered 2021-09-23: 3200 [IU]
  Filled 2021-09-23: qty 4

## 2021-09-23 MED ORDER — ALTEPLASE 2 MG IJ SOLR
INTRAMUSCULAR | Status: AC
  Administered 2021-09-23: 4 mg
  Filled 2021-09-23: qty 4

## 2021-09-23 NOTE — TOC Progression Note (Signed)
Transition of Care (TOC) - Initial/Assessment Note  ? ? ?Patient Details  ?Name: Jessica Mercado ?MRN: [id number] ?Date of Birth: 03-Feb-1961 ? ?Transition of Care (TOC) CM/SW Contact:    ?Paulene Floor Krystall Kruckenberg, LCSWA ?Phone Number: ?09/23/2021, 12:07 PM ? ?Clinical Narrative:                 ?CSW contacted Janie with [facility].  The facility can accept the patient and will have to request insurance authorization.  An updated PT note is needed prior to the facility being able to request insurance auth.  MD notified.  ? ?  ?Barriers to Discharge: Continued Medical Work up ? ? ?Patient Goals and CMS Choice ?Patient states their goals for this hospitalization and ongoing recovery are:: To return home ?CMS Medicare.gov Compare Post Acute Care list provided to:: Patient ?Choice offered to / list presented to : Patient ? ?Expected Discharge Plan and Services ?  ?  ?Discharge Planning Services: CM Consult ?  ?Living arrangements for the past 2 months: [place] ?Expected Discharge Date: 09/20/21               ?  ?  ?  ?  ?  ?  ?  ?  ?  ?  ? ?Prior Living Arrangements/Services ?Living arrangements for the past 2 months: [place] ?Lives with:: Relatives (Son in-law, [name]) ?Patient language and need for interpreter reviewed:: Yes ?Do you feel safe going back to the place where you live?: Yes      ?Need for Family Participation in Patient Care: Yes (Comment) ?Care giver support system in place?: Yes (comment) ?Current home services: DME (Walker, cane, shower seat.) ?Criminal Activity/Legal Involvement Pertinent to Current Situation/Hospitalization: No - Comment as needed ? ?Activities of Daily Living ?Home Assistive Devices/Equipment: Gilford Rile (specify type) ?ADL Screening (condition at time of admission) ?Patient's cognitive ability adequate to safely complete daily activities?: No ?Is the patient deaf or have difficulty hearing?: No ?Does the patient have difficulty seeing, even when wearing glasses/contacts?:  Yes ?Does the patient have difficulty concentrating, remembering, or making decisions?: Yes ?Patient able to express need for assistance with ADLs?: Yes ?Does the patient have difficulty dressing or bathing?: No ?Independently performs ADLs?: No ?Does the patient have difficulty walking or climbing stairs?: Yes ?Weakness of Legs: Both ?Weakness of Arms/Hands: Both ? ?Permission Sought/Granted ?Permission sought to share information with : Case Manager, Family Supports ?Permission granted to share information with : Yes, Verbal Permission Granted ?   ?   ?   ?   ? ?Emotional Assessment ?Appearance:: Appears stated age ?Attitude/Demeanor/Rapport: Engaged, Gracious ?Affect (typically observed): Accepting, Appropriate, Calm, Hopeful ?Orientation: : Oriented to Self, Oriented to Place, Oriented to  Time, Oriented to Situation ?Alcohol / Substance Use: Not Applicable ?Psych Involvement: No (comment) ? ?Admission diagnosis:  ESRD (end stage renal disease) ([facility]) [N18.6] ?Acute renal failure (ARF) ([facility]) [N17.9] ?Closed trimalleolar fracture of left ankle, initial encounter [S82.852A] ?Patient Active Problem List  ? Diagnosis Date Noted  ? Acute renal failure (ARF) ([facility]) 09/17/2021  ? Gait instability 09/16/2021  ? Diabetic retinopathy with macular edema ([place]) 09/16/2021  ? Irritable bowel syndrome 09/16/2021  ? Obstructive sleep apnea 09/16/2021  ? Restless legs syndrome (RLS) 09/16/2021  ? ESRD (end stage renal disease) ([place]) 09/16/2021  ? Uremia 09/16/2021  ? Falls 09/16/2021  ? Syncope 09/16/2021  ? Closed trimalleolar fracture of ankle, left, initial encounter 09/16/2021  ? Heart failure, systolic and diastolic ([place]) 93/81/0175  ? Diabetes mellitus  type 2 with complications ([place]) 38/45/3646  ? Anemia associated with chronic renal failure 07/18/2021  ? Hyperlipidemia 07/18/2021  ? Obesity 07/18/2021  ? Hypertension associated with chronic kidney disease due to type 2 diabetes mellitus ([place]) 03/30/2020  ? Hyponatremia  03/30/2020  ? ?PCP:  Vassie Moment, MD ?Pharmacy:   ?[id number] - [place], [place] ?[place] ?[place] [address] ?Phone: [phone] Fax: [phone] ? ?[facility] ?[address] ?[place] [address] ?Phone: [phone] Fax: [phone] ? ? ? ? ?Social Determinants of Health (SDOH) Interventions ?  ? ?Readmission Risk Interventions ? ?  07/20/2021  ?  4:39 PM  ?Readmission Risk Prevention Plan  ?Transportation Screening Complete  ?[place] or Home Care Consult Complete  ?Social Work Consult for [place] Planning/Counseling Complete  ?Palliative Care Screening Not Applicable  ?Medication Review Press photographer) Complete  ? ? ? ?
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
## 2021-09-23 NOTE — Progress Notes (Signed)
61 year old female with end-stage renal disease that vascular surgery was consulted for exchange of her TDC.  In review of the notes, she underwent a right IJ tunneled dialysis catheter by Dr. Donzetta Matters on 09/17/2021.  Apparently they put Cathflo in the catheter today after it was not working and still could not get good flow volumes.  I discussed with Dr. Posey Pronto with nephrology.  Please keep her n.p.o. after midnight and will schedule for exchange of her [facility] tomorrow likely with Dr. Scot Dock in the [place].  Risks benefits discussed. ? ?Marty Heck, MD ?[facility] ?Office: [phone] ? ? ?Marty Heck ? ?

## 2021-09-23 NOTE — TOC Progression Note (Addendum)
Transition of Care (TOC) - Initial/Assessment Note  ? ? ?Patient Details  ?Name: Jessica Mercado ?MRN: [id number] ?Date of Birth: 04-20-1961 ? ?Transition of Care (TOC) CM/SW Contact:    ?Jessica Mercado Jessica Mercado, LCSWA ?Phone Number: ?09/23/2021, 3:19 PM ? ?Clinical Narrative:                 ?CSW was contacted by Jessica Mercado at [employer] and was informed that the patient's medical insurance is no longer active.  CSW contacted the patient's son in law, Jessica Mercado, and gave him the telephone number to the state [phone] to call and inquire.   ? ?1600:  CSW received a returned call from the patient's son in law, Jessica Mercado, who reports that the patient is now wanting to come home with him and requested to speak with the Jessica Mercado to receive information about when equipment can be delivered.  The son in law also reports that he contacted Jessica Mercado [phone]) and the patient's insurance is still active.  RNCM notified.   ?  ?Barriers to Discharge: Continued Medical Work up ? ? ?Patient Goals and CMS Choice ?Patient states their goals for this hospitalization and ongoing recovery are:: To return home ?CMS Medicare.gov Compare Post Acute Care list provided to:: Patient ?Choice offered to / list presented to : Patient ? ?Expected Discharge Plan and Services ?  ?  ?Discharge Planning Services: CM Consult ?  ?Living arrangements for the past 2 months: [place] ?Expected Discharge Date: 09/20/21               ?  ?  ?  ?  ?  ?  ?  ?  ?  ?  ? ?Prior Living Arrangements/Services ?Living arrangements for the past 2 months: [place] ?Lives with:: Relatives (Son in-law, Jessica Mercado) ?Patient language and need for interpreter reviewed:: Yes ?Do you feel safe going back to the place where you live?: Yes      ?Need for Family Participation in Patient Care: Yes (Comment) ?Care giver support system in place?: Yes (comment) ?Current home services: DME (Walker, cane, shower seat.) ?Criminal Activity/Legal Involvement Pertinent to Current  Situation/Hospitalization: No - Comment as needed ? ?Activities of Daily Living ?Home Assistive Devices/Equipment: Gilford Rile (specify type) ?ADL Screening (condition at time of admission) ?Patient's cognitive ability adequate to safely complete daily activities?: No ?Is the patient deaf or have difficulty hearing?: No ?Does the patient have difficulty seeing, even when wearing glasses/contacts?: Yes ?Does the patient have difficulty concentrating, remembering, or making decisions?: Yes ?Patient able to express need for assistance with ADLs?: Yes ?Does the patient have difficulty dressing or bathing?: No ?Independently performs ADLs?: No ?Does the patient have difficulty walking or climbing stairs?: Yes ?Weakness of Legs: Both ?Weakness of Arms/Hands: Both ? ?Permission Sought/Granted ?Permission sought to share information with : Case Manager, Family Supports ?Permission granted to share information with : Yes, Verbal Permission Granted ?   ?   ?   ?   ? ?Emotional Assessment ?Appearance:: Appears stated age ?Attitude/Demeanor/Rapport: Engaged, Gracious ?Affect (typically observed): Accepting, Appropriate, Calm, Hopeful ?Orientation: : Oriented to Self, Oriented to Place, Oriented to  Time, Oriented to Situation ?Alcohol / Substance Use: Not Applicable ?Psych Involvement: No (comment) ? ?Admission diagnosis:  ESRD (end stage renal disease) ([place]) [N18.6] ?Acute renal failure (ARF) (HCC) [N17.9] ?Closed trimalleolar fracture of left ankle, initial encounter [S82.852A] ?Patient Active Problem List  ? Diagnosis Date Noted  ? Acute renal failure (ARF) ([place]) 09/17/2021  ? Gait instability 09/16/2021  ?  Diabetic retinopathy with macular edema ([place]) 09/16/2021  ? Irritable bowel syndrome 09/16/2021  ? Obstructive sleep apnea 09/16/2021  ? Restless legs syndrome (RLS) 09/16/2021  ? ESRD (end stage renal disease) ([place]) 09/16/2021  ? Uremia 09/16/2021  ? Falls 09/16/2021  ? Syncope 09/16/2021  ? Closed trimalleolar fracture of  ankle, left, initial encounter 09/16/2021  ? Heart failure, systolic and diastolic ([place]) 96/22/2979  ? Diabetes mellitus type 2 with complications ([place]) 89/21/1941  ? Anemia associated with chronic renal failure 07/18/2021  ? Hyperlipidemia 07/18/2021  ? Obesity 07/18/2021  ? Hypertension associated with chronic kidney disease due to type 2 diabetes mellitus ([place]) 03/30/2020  ? Hyponatremia 03/30/2020  ? ?PCP:  Jessica Moment, MD ?Pharmacy:   ?[id number] - [place], [address] ?[address] ?[place] [address] ?Phone: [phone] Fax: [phone] ? ?[facility] ?[address] ?[place] [address] ?Phone: [phone] Fax: [phone] ? ? ? ? ?Social Determinants of Health (SDOH) Interventions ?  ? ?Readmission Risk Interventions ? ?  07/20/2021  ?  4:39 PM  ?Readmission Risk Prevention Plan  ?Transportation Screening Complete  ?[place] or Home Care Consult Complete  ?Social Work Consult for [place] Planning/Counseling Complete  ?Palliative Care Screening Not Applicable  ?Medication Review Press photographer) Complete  ? ? ? ?

## 2021-09-23 NOTE — Progress Notes (Signed)
Pt off the unit to hemo 

## 2021-09-23 NOTE — Progress Notes (Signed)
Patient ID: Jessica Mercado, female   DOB: May 31, 1960, 61 y.o.   MRN: [id number] ? ?[facility] ?Progress Note  ? ?Assessment/ Plan:   ?1. Falls/syncope: Suspected to be associated with worsening renal function/uremia and plans noted for admission to SNF for continued strengthening following discharge. ?2. 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  Problems noted with tunneled hemodialysis catheter function today and attempting alteplase.  If this is unsuccessful, will reach out to vascular surgery or interventional radiology for catheter exchange. ?3. Anemia: Likely secondary to anemia of chronic disease, continue ongoing management with ESA.  Significant iron deficiency noted, will redose intravenous iron. 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 ?5. Nutrition: Continue renal diet with fluid restriction and ongoing oral nutritional supplements. ?6. Hypertension: Blood pressures intermittently elevated but overall trend improving with hemodialysis, will continue to probe estimated dry weight. ? ?Subjective:   ?Having problems with RIJ TDC malfunction- will attempt tPA dwell for 30-45 min and see response- if unsuccessful, will order for [facility] exchange.   ? ?Objective:   ?BP (!) 162/88   Pulse 66   Temp 98 ?F (36.7 ?C) (Oral)   Resp 12   Ht 5\' 7"  (1.702 m)   Wt 90.8 kg   LMP  (LMP Unknown)   SpO2 93%   BMI 31.35 kg/m?  ? ?Physical Exam: ?Gen: Appears uncomfortable resting in dialysis ?CVS: Pulse regular rhythm, normal rate, S1 and S2 normal ?Resp: Clear to auscultation bilaterally, no rales/rhonchi.  Right IJ TDC ?Abd: Soft, flat, nontender, bowel sounds normal ?Ext: No lower extremity edema palpable ? ?Labs: ?BMET ?Recent Labs  ?Lab 09/16/21 ?0928 09/16/21 ?1306 09/17/21 ?0159 09/18/21 ?8413 09/19/21 ?0203 09/20/21 ?2440 09/23/21 ?0414  ?NA 136  --   136 137 137 134* 134*  ?K 4.3  --  3.8 4.3 3.8 3.7 3.4*  ?CL 96*  --  100 99 99 99 99  ?CO2 24  --  25 27 27 28 25   ?GLUCOSE 182*  --  113* 168* 91 120* 137*  ?BUN 86*  --  90* 63* 38* 44* 39*  ?CREATININE 8.06*  --  7.78* 5.55* 4.13* 4.93* 4.92*  ?CALCIUM 8.5*  --  8.4* 8.2* 8.2* 8.3* 8.5*  ?PHOS  --  5.0* 5.1* 5.1* 2.5 2.9 4.1  ? ?CBC ?Recent Labs  ?Lab 09/16/21 ?0928 09/17/21 ?0159 09/18/21 ?1027 09/19/21 ?0203 09/20/21 ?2536 09/23/21 ?0414  ?WBC 16.4*   < > 9.9 12.0* 10.9* 12.4*  ?NEUTROABS 14.0*  --   --   --   --   --   ?HGB 10.4*   < > 8.5* 7.8* 8.5* 8.5*  ?HCT 31.8*   < > 25.5* 24.5* 25.7* 26.1*  ?MCV 89.8   < > 87.9 90.1 89.9 89.1  ?PLT 277   < > 238 250 254 258  ? < > = values in this interval not displayed.  ? ? ?  ?Medications:   ? ? acetaminophen  500 mg Oral Q12H  ? alteplase      ? atorvastatin  40 mg Oral Daily  ? benztropine  1 mg Oral QPM  ? Chlorhexidine Gluconate Cloth  6 each Topical Q0600  ? darbepoetin (ARANESP) injection - DIALYSIS  40 mcg Intravenous Q Mon-HD  ? FLUoxetine  60 mg Oral q morning  ? gabapentin  100 mg Oral Daily  ? heparin injection (subcutaneous)  5,000 Units Subcutaneous Q8H  ? heparin sodium (porcine)      ? insulin aspart  0-6 Units Subcutaneous TID WC  ? lurasidone  40 mg Oral QHS  ? metoprolol succinate  50 mg Oral Daily  ? multivitamin  1 tablet Oral QHS  ? prazosin  1 mg Oral QPM  ? traZODone  300 mg Oral QHS  ? ?Elmarie Shiley, MD ?09/23/2021, 9:11 AM  ? ?

## 2021-09-23 NOTE — Progress Notes (Signed)
PT Cancellation Note ? ?Patient Details ?Name: Jessica Mercado ?MRN: [id number] ?DOB: 1961-04-22 ? ? ?Cancelled Treatment:    Reason Eval/Treat Not Completed: Patient at procedure or test/unavailable ? ?Currently in HD; ? ?Will follow up later today as time allows;  ?Otherwise, will follow up for PT tomorrow;  ? ?Thank you,  ?Roney Marion, PT  ?Acute Rehabilitation Services ?Pager [phone] ?Office [phone] ? ?[place] ?09/23/2021, 8:35 AM ?

## 2021-09-23 NOTE — Progress Notes (Signed)
Family Medicine Teaching Service ?Daily Progress Note ?Intern Pager: [phone] ? ?Patient name: Jessica Mercado Medical record number: [id number] ?Date of birth: 12/04/1960 Age: 61 y.o. Gender: female ? ?Primary Care Provider: Vassie Moment, MD ?Consultants: Nephrology, orthopedics, vascular surgery ?Code Status: DNR  ? ?Pt Overview and Major Events to Date:  ?4/24-admitted ?4/25-ORIF for left trimalleolar ankle fracture, TDC placement, underwent first HD session ?4/27-CLIP for outpatient HD ?4/28-medically stable for discharge, patient now wanting to pursue SNF ? ?Assessment and Plan: ?Jessica Mercado is a 61 y.o. female presented with left trimalleolar ankle fracture now s/p ORIF, also initiation of HD during hospitalization. PMHx significant for: CKD V, T2DM, bipolar disorder, HFmrEF, HTN, and anemia. ?HD 6  ? ?Left trimalleolar fracture s/p ORIF, POD 6 ?Pain continued to be well controlled. Medically stable for discharge to SNF, which has an available bed but needs repeat PT/OT evaluation.  ?- Remain nonweightbearing left lower extremity x8weeks ?- Pain: tylenol 500mg  BID ?- Eliquis 2.5 mg BID x30days at discharge per ortho ?- F/u with outpatient ortho in 2 weeks after d/c ? ?New ESRD, HD MWF  ?Uremic on admission but has remained stable BUN 30s-40s. No hyperkalemia. ?- Nephrology following, patient assistance recommendations ?- VVS consulted, has TDC, planning for AV fistula with vascular surgery outpatient ?- Avoid nephrotoxic agents ?- Follow RFPs ? ?Normocytic anemia 2/2 iron deficiency and ESRD ?Hemoglobin remained stable, nephrology started iron infusion ?- Follow-up hemoglobin in the morning ?- Transfuse if Hb <7 ? ?Type 2 diabetes, diet controlled  Neuropathy  ?Fasting glucose 140s, CBGs around 170s 180s yesterday.  Received 2 units of short-acting insulin. ?- CBGs ? ?Hypertension, controlled ?Remained normotensive ?- Continued prazosin, metoprolol succinate ? ?Hyperlipidemia, stable ?- Continued home lipitor  40 mg daily ? ?Bipolar disorder, stable ?- Continued home fluoxetine, benztropine, lurasidone ?- Trazodone for bipolar depression and insomnia ? ?FEN/GI: Renal, Carb modified, Fluid restriction  ?PPx: heparin injection 5,000 Units Start: 09/18/21 0930  ?Dispo:SNF tomorrow, has bed. Barriers include insurance authorization.  ? ?Subjective:  ?Jessica Mercado was seen this AM. Stated that she feels crummy, but no new complaints. Discussed with her the importance of SNF, and she was amendable. Tolerating HD. ? ?Objective: ?Temp:  [97.9 ?F (36.6 ?C)-99.3 ?F (37.4 ?C)] 98 ?F (36.7 ?C) (05/01 7858) ?Pulse Rate:  [65-75] 66 (05/01 0817) ?Resp:  [11-18] 12 (05/01 1300) ?BP: (116-178)/(63-91) 134/80 (05/01 1300) ?SpO2:  [93 %-94 %] 93 % (05/01 0605) ?Weight:  [90.8 kg] 90.8 kg (05/01 0817) ?Physical Exam ?Vitals and nursing note reviewed.  ?Constitutional:   ?   General: She is not in acute distress. ?HENT:  ?   Head: Normocephalic and atraumatic.  ?Cardiovascular:  ?   Rate and Rhythm: Normal rate and regular rhythm.  ?   Heart sounds: No murmur heard. ?  No gallop.  ?Pulmonary:  ?   Effort: Pulmonary effort is normal.  ?   Breath sounds: Normal breath sounds.  ?Abdominal:  ?   General: There is no distension.  ?   Palpations: Abdomen is soft.  ?   Tenderness: There is no abdominal tenderness.  ?Skin: ?   General: Skin is warm and dry.  ?Neurological:  ?   Mental Status: She is alert.  ?  ? ?Laboratory: ?Recent Labs  ?Lab 09/19/21 ?0203 09/20/21 ?8502 09/23/21 ?0414  ?WBC 12.0* 10.9* 12.4*  ?HGB 7.8* 8.5* 8.5*  ?HCT 24.5* 25.7* 26.1*  ?PLT 250 254 258  ? ?Recent Labs  ?Lab 09/19/21 ?0203 09/20/21 ?7741  09/23/21 ?0414  ?NA 137 134* 134*  ?K 3.8 3.7 3.4*  ?CL 99 99 99  ?CO2 27 28 25   ?BUN 38* 44* 39*  ?CREATININE 4.13* 4.93* 4.92*  ?CALCIUM 8.2* 8.3* 8.5*  ?GLUCOSE 91 120* 137*  ? ?Results for orders placed or performed during the hospital encounter of 09/16/21 (from the past 24 hour(s))  ?Glucose, capillary     Status: Abnormal  ?  Collection Time: 09/22/21  4:34 PM  ?Result Value Ref Range  ? Glucose-Capillary 180 (H) 70 - 99 mg/dL  ?Glucose, capillary     Status: Abnormal  ? Collection Time: 09/22/21  8:39 PM  ?Result Value Ref Range  ? Glucose-Capillary 168 (H) 70 - 99 mg/dL  ?Renal function panel     Status: Abnormal  ? Collection Time: 09/23/21  4:14 AM  ?Result Value Ref Range  ? Sodium 134 (L) 135 - 145 mmol/L  ? Potassium 3.4 (L) 3.5 - 5.1 mmol/L  ? Chloride 99 98 - 111 mmol/L  ? CO2 25 22 - 32 mmol/L  ? Glucose, Bld 137 (H) 70 - 99 mg/dL  ? BUN 39 (H) 6 - 20 mg/dL  ? Creatinine, Ser 4.92 (H) 0.44 - 1.00 mg/dL  ? Calcium 8.5 (L) 8.9 - 10.3 mg/dL  ? Phosphorus 4.1 2.5 - 4.6 mg/dL  ? Albumin 2.5 (L) 3.5 - 5.0 g/dL  ? GFR, Estimated 10 (L) >60 mL/min  ? Anion gap 10 5 - 15  ?CBC     Status: Abnormal  ? Collection Time: 09/23/21  4:14 AM  ?Result Value Ref Range  ? WBC 12.4 (H) 4.0 - 10.5 K/uL  ? RBC 2.93 (L) 3.87 - 5.11 MIL/uL  ? Hemoglobin 8.5 (L) 12.0 - 15.0 g/dL  ? HCT 26.1 (L) 36.0 - 46.0 %  ? MCV 89.1 80.0 - 100.0 fL  ? MCH 29.0 26.0 - 34.0 pg  ? MCHC 32.6 30.0 - 36.0 g/dL  ? RDW 13.6 11.5 - 15.5 %  ? Platelets 258 150 - 400 K/uL  ? nRBC 0.0 0.0 - 0.2 %  ?Glucose, capillary     Status: Abnormal  ? Collection Time: 09/23/21  7:37 AM  ?Result Value Ref Range  ? Glucose-Capillary 149 (H) 70 - 99 mg/dL  ? ? ?Imaging/Diagnostic Tests: ?No results found.  ? ?Merrily Brittle, DO ?09/23/2021, 2:26 PM ?PGY-1, [facility] ?[place] Intern pager: [phone], text pages welcome  ?

## 2021-09-23 NOTE — Progress Notes (Signed)
Physical Therapy Treatment ?Patient Details ?Name: Jessica Mercado ?MRN: [id number] ?DOB: 17-Mar-1961 ?Today's Date: 09/23/2021 ? ? ?History of Present Illness 61 y/o female presented to ED on 09/16/21 after fall at home on 4/23. Sustained a trimalleolar fx of L ankle. S/p ORIF of L ankle on 4/25. PMH: ESRD, HTN, T2DM, heart failure, bipolar disorder ? ?  ?PT Comments  ? ? Continuing work on functional mobility and activity tolerance;  Session focused on bed mobility and transfer training, working on weight shifts and scooting while sitting EOB, with close attention to keeping weight off of LLE; Able to get up to EOB with good use of RLE to semi-bridge hips to EOB in prep for getting up; Sat EOB well, and able to lateral weight shift and elbow prop; simulated lateral scooting along edge of bed with mod assist -- currently needs close guard and assist to keep weight off of LLE; Assisted pt back into supine and had 2 person assist to help change the bed linens (pt had been incontinent);  ? ?Throughout session, pt voiced hesitation re: going to SNF for rehab; Still, she shows good rehab potential, needs to be able to mobilize better, more efficiently, and keeping NWB in order to be able to safely dc home and make it out of the house to HD multiple times a week; She had previous good outcomes after a 2 week SNF stay for rehab with her L ankle, and I anticipate good progress with consistent therapies at SNF this episode of care as well; Notified MD and SW of pt's hesitation; Continue to recommend SNF for post-acute rehab to maximize independence and safety with mobility and ADLs ?  ?Recommendations for follow up therapy are one component of a multi-disciplinary discharge planning process, led by the attending physician.  Recommendations may be updated based on patient status, additional functional criteria and insurance authorization. ? ?Follow Up Recommendations ? Skilled nursing-short term rehab (<3 hours/day) ?  ?   ?Assistance Recommended at Discharge Frequent or constant Supervision/Assistance  ?Patient can return home with the following Two people to help with walking and/or transfers;A lot of help with bathing/dressing/bathroom;Assistance with cooking/housework;Assist for transportation ?  ?Equipment Recommendations ? Rolling walker (2 wheels);BSC/3in1;Wheelchair (measurements PT);Wheelchair cushion (measurements PT);Hospital bed  ?  ?Recommendations for Other Services   ? ? ?  ?Precautions / Restrictions Precautions ?Precautions: Fall ?Required Braces or Orthoses: Splint/Cast ?Splint/Cast - Date Prophylactic Dressing Applied (if applicable): 03/11/50 ?Restrictions ?LLE Weight Bearing: Non weight bearing  ?  ? ?Mobility ? Bed Mobility ?Overal bed mobility: Needs Assistance ?Bed Mobility: Supine to Sit, Sit to Supine ?  ?  ?Supine to sit: Min assist ?Sit to supine: Min assist ?  ?General bed mobility comments: minA for LE management in/out bed. Patient able to use R LE to slightly bridge herself to EOB in prep for getting up; min assist mostly for managing th eVAC line while pt got back in bed ?  ? ?Transfers ?Overall transfer level: Needs assistance ?Equipment used: 1 person hand held assist (and bed pads) ?Transfers: Bed to chair/wheelchair/BSC (simulated) ?  ?  ?  ?  ?  ? Lateral/Scoot Transfers: Mod assist ?General transfer comment: Close guard and multimodal cues to keep weight off of LLE; tending to plant both feet on the floor and shift weight onto both feet to unweigh hips for scooting ?  ? ?Ambulation/Gait ?  ?  ?  ?  ?  ?  ?  ?  ? ? ?Stairs ?  ?  ?  ?  ?  ? ? ?  Wheelchair Mobility ?  ? ?Modified Rankin (Stroke Patients Only) ?  ? ? ?  ?Balance   ?  ?Sitting balance-Leahy Scale: Fair (approaching Good) ?Sitting balance - Comments: constant cueing to refrain from using L LE to assist with scooting towards EOB; performed lateral lean and elbow prop well to L side (attempted to place dry bed pads under her while  sitting EOB) ?  ?  ?  ?  ?  ?  ?  ?  ?  ?  ?  ?  ?  ?  ?  ?  ? ?  ?Cognition Arousal/Alertness: Awake/alert ?Behavior During Therapy: [facility] for tasks assessed/performed, Restless ?Overall Cognitive Status: No family/caregiver present to determine baseline cognitive functioning ?  ?  ?  ?  ?  ?  ?  ?  ?  ?  ?  ?  ?  ?  ?  ?  ?General Comments: difficulty processing information provided to patient about NWB as patient required max verbal and tactile cues to attempt to maintain; Pt very clearly stating she does not want to go to SNF; in the same session she described going to rehab for her R ankle years ago for 2 weeks with success; difficulty drawing the parallel for this episode of care ?  ?  ? ?  ?Exercises   ? ?  ?General Comments General comments (skin integrity, edema, etc.): Incontinent of urine; 2 person assist to change bed linens ?  ?  ? ?Pertinent Vitals/Pain Pain Assessment ?Pain Assessment: Faces ?Faces Pain Scale: Hurts little more ?Pain Location: L ankle ?Pain Descriptors / Indicators: Grimacing ?Pain Intervention(s): Monitored during session, Repositioned  ? ? ?Home Living   ?  ?  ?  ?  ?  ?  ?  ?  ?  ?   ?  ?Prior Function    ?  ?  ?   ? ?PT Goals (current goals can now be found in the care plan section) Acute Rehab PT Goals ?Patient Stated Goal: to go home and my son in law will take care of me ?PT Goal Formulation: With patient ?Time For Goal Achievement: 10/03/21 ?Potential to Achieve Goals: Fair ?Progress towards PT goals: Progressing toward goals (slowly) ? ?  ?Frequency ? ? ? Min 2X/week ? ? ? ?  ?PT Plan Current plan remains appropriate  ? ? ?Co-evaluation   ?  ?  ?  ?  ? ?  ?AM-PAC PT "6 Clicks" Mobility   ?Outcome Measure ? Help needed turning from your back to your side while in a flat bed without using bedrails?: A Little ?Help needed moving from lying on your back to sitting on the side of a flat bed without using bedrails?: A Little ?Help needed moving to and from a bed to a chair (including  a wheelchair)?: Total ?Help needed standing up from a chair using your arms (e.g., wheelchair or bedside chair)?: Total ?Help needed to walk in hospital room?: Total ?Help needed climbing 3-5 steps with a railing? : Total ?6 Click Score: 10 ? ?  ?End of Session Equipment Utilized During Treatment:  (bed pads) ?Activity Tolerance: Patient tolerated treatment well (though upset at notion of going to SNF for rehab, MD notified) ?Patient left: in bed;with call bell/phone within reach;with nursing/sitter in room ?  ?PT Visit Diagnosis: Muscle weakness (generalized) (M62.81);Ataxic gait (R26.0);History of falling (Z91.81);Other abnormalities of gait and mobility (R26.89);Unsteadiness on feet (R26.81) ?  ? ? ?Time: 1335-1405 ?PT Time Calculation (min) (ACUTE  ONLY): 30 min ? ?Charges:  $Therapeutic Activity: 23-37 mins          ?          ? ?Roney Marion, PT  ?Acute Rehabilitation Services ?Office [phone] ? ? ? ?Colletta Maryland ?09/23/2021, 2:27 PM ? ?

## 2021-09-24 ENCOUNTER — Other Ambulatory Visit: Payer: Self-pay

## 2021-09-24 ENCOUNTER — Inpatient Hospital Stay (HOSPITAL_COMMUNITY): Payer: Medicaid Other | Admitting: Anesthesiology

## 2021-09-24 ENCOUNTER — Encounter (HOSPITAL_COMMUNITY): Payer: Self-pay | Admitting: Family Medicine

## 2021-09-24 ENCOUNTER — Inpatient Hospital Stay (HOSPITAL_COMMUNITY): Payer: Medicaid Other

## 2021-09-24 ENCOUNTER — Encounter (HOSPITAL_COMMUNITY)
Admission: EM | Disposition: A | Discharge status: Rehabilitation Facility | Payer: Self-pay | Source: Home / Self Care | Attending: Family Medicine

## 2021-09-24 DIAGNOSIS — Z7984 Long term (current) use of oral hypoglycemic drugs: Secondary | ICD-10-CM

## 2021-09-24 DIAGNOSIS — N186 End stage renal disease: Secondary | ICD-10-CM

## 2021-09-24 DIAGNOSIS — E1122 Type 2 diabetes mellitus with diabetic chronic kidney disease: Secondary | ICD-10-CM

## 2021-09-24 DIAGNOSIS — I120 Hypertensive chronic kidney disease with stage 5 chronic kidney disease or end stage renal disease: Secondary | ICD-10-CM

## 2021-09-24 DIAGNOSIS — Z992 Dependence on renal dialysis: Secondary | ICD-10-CM

## 2021-09-24 DIAGNOSIS — N185 Chronic kidney disease, stage 5: Secondary | ICD-10-CM

## 2021-09-24 DIAGNOSIS — T8249XA Other complication of vascular dialysis catheter, initial encounter: Secondary | ICD-10-CM

## 2021-09-24 HISTORY — PX: EXCHANGE OF A DIALYSIS CATHETER: SHX5818

## 2021-09-24 LAB — POCT I-STAT, CHEM 8
BUN: 21 mg/dL — ABNORMAL HIGH (ref 6–20)
Calcium, Ion: 1.08 mmol/L — ABNORMAL LOW (ref 1.15–1.40)
Chloride: 99 mmol/L (ref 98–111)
Creatinine, Ser: 4.1 mg/dL — ABNORMAL HIGH (ref 0.44–1.00)
Glucose, Bld: 168 mg/dL — ABNORMAL HIGH (ref 70–99)
HCT: 26 % — ABNORMAL LOW (ref 36.0–46.0)
Hemoglobin: 8.8 g/dL — ABNORMAL LOW (ref 12.0–15.0)
Potassium: 3.9 mmol/L (ref 3.5–5.1)
Sodium: 134 mmol/L — ABNORMAL LOW (ref 135–145)
TCO2: 25 mmol/L (ref 22–32)

## 2021-09-24 LAB — GLUCOSE, CAPILLARY
Glucose-Capillary: 121 mg/dL — ABNORMAL HIGH (ref 70–99)
Glucose-Capillary: 175 mg/dL — ABNORMAL HIGH (ref 70–99)
Glucose-Capillary: 152 mg/dL — ABNORMAL HIGH (ref 70–99)
Glucose-Capillary: 138 mg/dL — ABNORMAL HIGH (ref 70–99)
Glucose-Capillary: 210 mg/dL — ABNORMAL HIGH (ref 70–99)
Glucose-Capillary: 275 mg/dL — ABNORMAL HIGH (ref 70–99)

## 2021-09-24 SURGERY — EXCHANGE OF A DIALYSIS CATHETER
Anesthesia: General | Site: Neck

## 2021-09-24 MED ORDER — ORAL CARE MOUTH RINSE: 15.0000 mL | Freq: Once | OROMUCOSAL | Status: AC

## 2021-09-24 MED ORDER — LIDOCAINE 2% (20 MG/ML) 5 ML SYRINGE
INTRAMUSCULAR | Status: DC | PRN
  Administered 2021-09-24: 60 mg via INTRAVENOUS

## 2021-09-24 MED ORDER — PHENYLEPHRINE 80 MCG/ML (10ML) SYRINGE FOR IV PUSH (FOR BLOOD PRESSURE SUPPORT)
PREFILLED_SYRINGE | INTRAVENOUS | Status: DC | PRN
  Administered 2021-09-24: 160 ug via INTRAVENOUS
  Administered 2021-09-24 (×2): 80 ug via INTRAVENOUS

## 2021-09-24 MED ORDER — DEXAMETHASONE SODIUM PHOSPHATE 10 MG/ML IJ SOLN
INTRAMUSCULAR | Status: AC
  Filled 2021-09-24: qty 1

## 2021-09-24 MED ORDER — MIDAZOLAM HCL 2 MG/2ML IJ SOLN
INTRAMUSCULAR | Status: DC | PRN
  Administered 2021-09-24: 2 mg via INTRAVENOUS

## 2021-09-24 MED ORDER — FENTANYL CITRATE (PF) 100 MCG/2ML IJ SOLN
INTRAMUSCULAR | Status: AC
  Filled 2021-09-24: qty 2

## 2021-09-24 MED ORDER — CEFAZOLIN SODIUM-DEXTROSE 2-3 GM-%(50ML) IV SOLR
INTRAVENOUS | Status: DC | PRN
  Administered 2021-09-24: 2 g via INTRAVENOUS

## 2021-09-24 MED ORDER — PROPOFOL 10 MG/ML IV BOLUS
INTRAVENOUS | Status: DC | PRN
  Administered 2021-09-24: 150 mg via INTRAVENOUS

## 2021-09-24 MED ORDER — 0.9 % SODIUM CHLORIDE (POUR BTL) OPTIME
TOPICAL | Status: DC | PRN
  Administered 2021-09-24: 1000 mL

## 2021-09-24 MED ORDER — MIDAZOLAM HCL 2 MG/2ML IJ SOLN
INTRAMUSCULAR | Status: AC
  Filled 2021-09-24: qty 2

## 2021-09-24 MED ORDER — CHLORHEXIDINE GLUCONATE 0.12 % MT SOLN: 15.0000 mL | Freq: Once | OROMUCOSAL | Status: AC

## 2021-09-24 MED ORDER — ONDANSETRON HCL 4 MG/2ML IJ SOLN
INTRAMUSCULAR | Status: DC | PRN
  Administered 2021-09-24: 4 mg via INTRAVENOUS

## 2021-09-24 MED ORDER — HEPARIN 6000 UNIT IRRIGATION SOLUTION
Status: AC
  Filled 2021-09-24: qty 500

## 2021-09-24 MED ORDER — ONDANSETRON HCL 4 MG/2ML IJ SOLN: 4.0000 mg | Freq: Once | INTRAMUSCULAR | Status: DC | PRN

## 2021-09-24 MED ORDER — SODIUM CHLORIDE 0.9 % IV SOLN: INTRAVENOUS | Status: DC | PRN

## 2021-09-24 MED ORDER — FENTANYL CITRATE (PF) 250 MCG/5ML IJ SOLN
INTRAMUSCULAR | Status: AC
  Filled 2021-09-24: qty 5

## 2021-09-24 MED ORDER — HEPARIN SODIUM (PORCINE) 1000 UNIT/ML IJ SOLN
INTRAMUSCULAR | Status: DC | PRN
  Administered 2021-09-24: 1000 [IU]

## 2021-09-24 MED ORDER — CHLORHEXIDINE GLUCONATE 0.12 % MT SOLN
OROMUCOSAL | Status: AC
  Administered 2021-09-24: 15 mL via OROMUCOSAL
  Filled 2021-09-24: qty 15

## 2021-09-24 MED ORDER — SODIUM CHLORIDE (PF) 0.9 % IJ SOLN
INTRAMUSCULAR | Status: AC
  Filled 2021-09-24: qty 20

## 2021-09-24 MED ORDER — CEFAZOLIN SODIUM 1 G IJ SOLR
INTRAMUSCULAR | Status: AC
  Filled 2021-09-24: qty 20

## 2021-09-24 MED ORDER — PHENYLEPHRINE 80 MCG/ML (10ML) SYRINGE FOR IV PUSH (FOR BLOOD PRESSURE SUPPORT)
PREFILLED_SYRINGE | INTRAVENOUS | Status: AC
  Filled 2021-09-24: qty 10

## 2021-09-24 MED ORDER — OXYCODONE-ACETAMINOPHEN 5-325 MG PO TABS: 1.0000 | ORAL_TABLET | ORAL | Status: DC | PRN

## 2021-09-24 MED ORDER — FENTANYL CITRATE (PF) 100 MCG/2ML IJ SOLN
25.0000 ug | INTRAMUSCULAR | Status: DC | PRN
  Administered 2021-09-24: 25 ug via INTRAVENOUS

## 2021-09-24 MED ORDER — HEPARIN 6000 UNIT IRRIGATION SOLUTION
Status: DC | PRN
  Administered 2021-09-24: 1

## 2021-09-24 MED ORDER — FENTANYL CITRATE (PF) 250 MCG/5ML IJ SOLN
INTRAMUSCULAR | Status: DC | PRN
  Administered 2021-09-24: 50 ug via INTRAVENOUS
  Administered 2021-09-24 (×2): 25 ug via INTRAVENOUS

## 2021-09-24 MED ORDER — DEXAMETHASONE SODIUM PHOSPHATE 10 MG/ML IJ SOLN
INTRAMUSCULAR | Status: DC | PRN
  Administered 2021-09-24: 10 mg via INTRAVENOUS

## 2021-09-24 MED ORDER — ONDANSETRON HCL 4 MG/2ML IJ SOLN
INTRAMUSCULAR | Status: AC
  Filled 2021-09-24: qty 2

## 2021-09-24 MED ORDER — SODIUM CHLORIDE 0.9 % IV SOLN: INTRAVENOUS | Status: DC

## 2021-09-24 MED ORDER — HEPARIN SODIUM (PORCINE) 1000 UNIT/ML IJ SOLN
INTRAMUSCULAR | Status: AC
  Filled 2021-09-24: qty 10

## 2021-09-24 MED ORDER — LIDOCAINE HCL (PF) 1 % IJ SOLN
INTRAMUSCULAR | Status: AC
  Filled 2021-09-24: qty 30

## 2021-09-24 SURGICAL SUPPLY — 47 items
ADH SKN CLS APL DERMABOND .7 (GAUZE/BANDAGES/DRESSINGS) ×1
APL PRP STRL LF DISP 70% ISPRP (MISCELLANEOUS) ×1
BAG COUNTER SPONGE SURGICOUNT (BAG) ×2 IMPLANT
BAG DECANTER FOR FLEXI CONT (MISCELLANEOUS) ×2 IMPLANT
BAG SPNG CNTER NS LX DISP (BAG) ×1
BIOPATCH RED 1 DISK 7.0 (GAUZE/BANDAGES/DRESSINGS) ×2 IMPLANT
CATH PALINDROME-P 19CM W/VT (CATHETERS) ×1 IMPLANT
CATH PALINDROME-P 23CM W/VT (CATHETERS) IMPLANT
CATH PALINDROME-P 28CM W/VT (CATHETERS) IMPLANT
CHLORAPREP W/TINT 26 (MISCELLANEOUS) ×2 IMPLANT
COVER PROBE W GEL 5X96 (DRAPES) IMPLANT
COVER SURGICAL LIGHT HANDLE (MISCELLANEOUS) ×2 IMPLANT
DERMABOND ADVANCED (GAUZE/BANDAGES/DRESSINGS) ×1
DERMABOND ADVANCED .7 DNX12 (GAUZE/BANDAGES/DRESSINGS) ×1 IMPLANT
DRAPE C-ARM 42X72 X-RAY (DRAPES) ×2 IMPLANT
DRAPE CHEST BREAST 15X10 FENES (DRAPES) ×2 IMPLANT
DRSG COVADERM PLUS 2X2 (GAUZE/BANDAGES/DRESSINGS) ×1 IMPLANT
GAUZE 4X4 16PLY ~~LOC~~+RFID DBL (SPONGE) ×2 IMPLANT
GAUZE SPONGE 2X2 8PLY STRL LF (GAUZE/BANDAGES/DRESSINGS) ×1 IMPLANT
GLOVE BIO SURGEON STRL SZ7.5 (GLOVE) ×2 IMPLANT
GLOVE BIOGEL PI IND STRL 8 (GLOVE) ×1 IMPLANT
GLOVE BIOGEL PI INDICATOR 8 (GLOVE) ×1
GLOVE SURG POLY ORTHO LF SZ7.5 (GLOVE) IMPLANT
GLOVE SURG UNDER LTX SZ8 (GLOVE) ×2 IMPLANT
GOWN STRL REUS W/ TWL LRG LVL3 (GOWN DISPOSABLE) ×3 IMPLANT
GOWN STRL REUS W/TWL LRG LVL3 (GOWN DISPOSABLE) ×6
KIT BASIN OR (CUSTOM PROCEDURE TRAY) ×2 IMPLANT
KIT PALINDROME-P 55CM (CATHETERS) IMPLANT
KIT TURNOVER KIT B (KITS) ×2 IMPLANT
NDL 18GX1X1/2 (RX/OR ONLY) (NEEDLE) ×1 IMPLANT
NDL HYPO 25GX1X1/2 BEV (NEEDLE) ×1 IMPLANT
NEEDLE 18GX1X1/2 (RX/OR ONLY) (NEEDLE) ×2 IMPLANT
NEEDLE 22X1 1/2 (OR ONLY) (NEEDLE) ×2 IMPLANT
NEEDLE HYPO 25GX1X1/2 BEV (NEEDLE) ×2 IMPLANT
NS IRRIG 1000ML POUR BTL (IV SOLUTION) ×2 IMPLANT
PACK SURGICAL SETUP 50X90 (CUSTOM PROCEDURE TRAY) ×2 IMPLANT
PAD ARMBOARD 7.5X6 YLW CONV (MISCELLANEOUS) ×4 IMPLANT
SPONGE GAUZE 2X2 STER 10/PKG (GAUZE/BANDAGES/DRESSINGS) ×1
SUT ETHILON 3 0 PS 1 (SUTURE) ×2 IMPLANT
SUT MNCRL AB 4-0 PS2 18 (SUTURE) ×2 IMPLANT
SYR 10ML LL (SYRINGE) ×2 IMPLANT
SYR 20ML LL LF (SYRINGE) ×4 IMPLANT
SYR 30ML LL (SYRINGE) IMPLANT
SYR 5ML LL (SYRINGE) ×4 IMPLANT
SYR CONTROL 10ML LL (SYRINGE) ×2 IMPLANT
TOWEL GREEN STERILE (TOWEL DISPOSABLE) ×2 IMPLANT
WATER STERILE IRR 1000ML POUR (IV SOLUTION) ×2 IMPLANT

## 2021-09-24 NOTE — H&P (View-Only) (Signed)
[facility] ? ?Subjective  -no complaints ? ? ?Objective ?127/69 ?70 ?98.3 ?F (36.8 ?C) (Axillary) ?14 ?95% ? ?Intake/Output Summary (Last 24 hours) at 09/24/2021 0630 ?Last data filed at 09/24/2021 0600 ?Gross per 24 hour  ?Intake 680 ml  ?Output 1750 ml  ?Net -1070 ml  ? ? ?Right IJ TDC in place ? ?Laboratory ?Lab Results: ?Recent Labs  ?  09/23/21 ?1281  ?WBC 12.4*  ?HGB 8.5*  ?HCT 26.1*  ?PLT 258  ? ?BMET ?Recent Labs  ?  09/23/21 ?0414  ?NA 134*  ?K 3.4*  ?CL 99  ?CO2 25  ?GLUCOSE 137*  ?BUN 39*  ?CREATININE 4.92*  ?CALCIUM 8.5*  ? ? ?COAG ?Lab Results  ?Component Value Date  ? INR 1.1 09/16/2021  ? ?No results found for: PTT ? ?Assessment/Planning: ? ?61 year old female with end-stage renal disease that vascular surgery was consulted yesterday for exchange of her [facility] that was placed on 4/25 by Dr. Donzetta Matters and is not working.  I did discuss with nephrology Dr. Posey Pronto and he states even after cath flow did not get good flow volumes.  She is scheduled today with Dr. Scot Dock for [facility] exchange.  Please keep n.p.o. after midnight.  In review of the notes previous plan was outpatient follow-up for permanent hemodialysis access evaluation. ? ?Marty Heck ?09/24/2021 ?6:30 AM ?-- ? ? ?

## 2021-09-24 NOTE — Transfer of Care (Signed)
Immediate Anesthesia Transfer of Care Note ? ?Patient: Jessica Mercado ? ?Procedure(s) Performed: EXCHANGE OF A TEMPORARY DIALYSIS CATHETER TO PERMANENT DIALYSIS CATHETER (Neck) ? ?Patient Location: PACU ? ?Anesthesia Type:General ? ?Level of Consciousness: awake and drowsy ? ?Airway & Oxygen Therapy: Patient Spontanous Breathing ? ?Post-op Assessment: Report given to RN and Post -op Vital signs reviewed and stable ? ?Post vital signs: Reviewed and stable ? ?Last Vitals:  ?Vitals Value Taken Time  ?BP 143/73   ?Temp    ?Pulse 70 09/24/21 1416  ?Resp 14   ?SpO2 97 % 09/24/21 1416  ?Vitals shown include unvalidated device data. ? ?Last Pain:  ?Vitals:  ? 09/24/21 1203  ?TempSrc:   ?PainSc: 0-No pain  ?   ? ?Patients Stated Pain Goal: 0 (09/23/21 2211) ? ?Complications: No notable events documented. ?

## 2021-09-24 NOTE — Plan of Care (Signed)

## 2021-09-24 NOTE — TOC Progression Note (Addendum)
Transition of Care (TOC) - Initial/Assessment Note  ? ? ?Patient Details  ?Name: Jessica Mercado ?MRN: [id number] ?Date of Birth: 1961/02/09 ? ?Transition of Care (TOC) CM/SW Contact:    ?Paulene Floor Almer [place], LCSWA ?Phone Number: ?09/24/2021, 12:25 PM ? ?Clinical Narrative:                 ?CSW contacted the patient's son in law and provided the information to [place] to assist with getting the home accessible for the patient who is disabled.  The son in law mentioned issues with the landlord and not completing things in the home and CSW provided the son with information for legal aid so that he could inquire about whether anything can be done legally to assist with this matter.   ? ?  ?Barriers to Discharge: Continued Medical Work up ? ? ?Patient Goals and CMS Choice ?Patient states their goals for this hospitalization and ongoing recovery are:: To return home ?CMS Medicare.gov Compare Post Acute Care list provided to:: Patient ?Choice offered to / list presented to : Patient ? ?Expected Discharge Plan and Services ?  ?  ?Discharge Planning Services: CM Consult ?  ?Living arrangements for the past 2 months: [place] ?Expected Discharge Date: 09/20/21               ?  ?  ?  ?  ?  ?  ?  ?  ?  ?  ? ?Prior Living Arrangements/Services ?Living arrangements for the past 2 months: [place] ?Lives with:: Relatives (Son in-law, [name]) ?Patient language and need for interpreter reviewed:: Yes ?Do you feel safe going back to the place where you live?: Yes      ?Need for Family Participation in Patient Care: Yes (Comment) ?Care giver support system in place?: Yes (comment) ?Current home services: DME (Walker, cane, shower seat.) ?Criminal Activity/Legal Involvement Pertinent to Current Situation/Hospitalization: No - Comment as needed ? ?Activities of Daily Living ?Home Assistive Devices/Equipment: Gilford Rile (specify type) ?ADL Screening (condition at time of admission) ?Patient's cognitive ability adequate  to safely complete daily activities?: No ?Is the patient deaf or have difficulty hearing?: No ?Does the patient have difficulty seeing, even when wearing glasses/contacts?: Yes ?Does the patient have difficulty concentrating, remembering, or making decisions?: Yes ?Patient able to express need for assistance with ADLs?: Yes ?Does the patient have difficulty dressing or bathing?: No ?Independently performs ADLs?: No ?Does the patient have difficulty walking or climbing stairs?: Yes ?Weakness of Legs: Both ?Weakness of Arms/Hands: Both ? ?Permission Sought/Granted ?Permission sought to share information with : Case Manager, Family Supports ?Permission granted to share information with : Yes, Verbal Permission Granted ?   ?   ?   ?   ? ?Emotional Assessment ?Appearance:: Appears stated age ?Attitude/Demeanor/Rapport: Engaged, Gracious ?Affect (typically observed): Accepting, Appropriate, Calm, Hopeful ?Orientation: : Oriented to Self, Oriented to Place, Oriented to  Time, Oriented to Situation ?Alcohol / Substance Use: Not Applicable ?Psych Involvement: No (comment) ? ?Admission diagnosis:  ESRD (end stage renal disease) ([place]) [N18.6] ?Acute renal failure (ARF) ([facility]) [N17.9] ?Closed trimalleolar fracture of left ankle, initial encounter [S82.852A] ?Patient Active Problem List  ? Diagnosis Date Noted  ? Acute renal failure (ARF) ([place]) 09/17/2021  ? Gait instability 09/16/2021  ? Diabetic retinopathy with macular edema ([place]) 09/16/2021  ? Irritable bowel syndrome 09/16/2021  ? Obstructive sleep apnea 09/16/2021  ? Restless legs syndrome (RLS) 09/16/2021  ? ESRD (end stage renal disease) ([place]) 09/16/2021  ?  Uremia 09/16/2021  ? Falls 09/16/2021  ? Syncope 09/16/2021  ? Closed trimalleolar fracture of ankle, left, initial encounter 09/16/2021  ? Heart failure, systolic and diastolic ([place]) 37/54/2370  ? Diabetes mellitus type 2 with complications ([place]) 23/05/7207  ? Anemia associated with chronic renal failure 07/18/2021  ?  Hyperlipidemia 07/18/2021  ? Obesity 07/18/2021  ? Hypertension associated with chronic kidney disease due to type 2 diabetes mellitus ([place]) 03/30/2020  ? Hyponatremia 03/30/2020  ? ?PCP:  Vassie Moment, MD ?Pharmacy:   ?[id number] [place], [place] ?[place] ?[place] [address] ?Phone: [phone] Fax: [phone] ? ?[facility] ?[address] ?[place] [address] ?Phone: [phone] Fax: [phone] ? ? ? ? ?Social Determinants of Health (SDOH) Interventions ?  ? ?Readmission Risk Interventions ? ?  07/20/2021  ?  4:39 PM  ?Readmission Risk Prevention Plan  ?Transportation Screening Complete  ?[place] or Home Care Consult Complete  ?Social Work Consult for [place] Planning/Counseling Complete  ?Palliative Care Screening Not Applicable  ?Medication Review Press photographer) Complete  ? ? ? ?

## 2021-09-24 NOTE — TOC Progression Note (Cosign Needed)
Transition of Care (TOC) - Progression Note  ? ? ?Patient Details  ?Name: Jessica Mercado ?MRN: [id number] ?Date of Birth: [birth year] ? ?Transition of Care (TOC) CM/SW Contact  ?Tom-Johnson, Renea Ee, RN ?Phone Number: ?09/24/2021, 2:45 PM ? ?Clinical Narrative:    ? ?Patient suffers from ankle fracture which impairs their ability to perform daily activities like bathing, dressing, grooming, and toileting in the home.  A walker will not resolve issue with performing activities of daily living. A wheelchair will allow patient to safely perform daily activities. Patient can safely propel the wheelchair in the home or has a caregiver who can provide assistance. Length of need 6 months . ?Accessories: elevating leg rests (ELRs), wheel locks, extensions and anti-tippers. ? ?  ?Barriers to Discharge: Continued Medical Work up ? ?Expected Discharge Plan and Services ?  ?  ?Discharge Planning Services: CM Consult ?  ?Living arrangements for the past 2 months: [place] ?Expected Discharge Date: 09/20/21               ?  ?  ?  ?  ?  ?  ?  ?  ?  ?  ? ? ?Social Determinants of Health (SDOH) Interventions ?  ? ?Readmission Risk Interventions ? ?  07/20/2021  ?  4:39 PM  ?Readmission Risk Prevention Plan  ?Transportation Screening Complete  ?[place] or Home Care Consult Complete  ?Social Work Consult for [place] Planning/Counseling Complete  ?Palliative Care Screening Not Applicable  ?Medication Review Press photographer) Complete  ? ? ?

## 2021-09-24 NOTE — Progress Notes (Signed)
FPTS Brief Note ?Reviewed patient's vitals, recent notes.  ?Vitals:  ? 09/23/21 1738 09/23/21 2106  ?BP: (!) 172/87 (!) 142/72  ?Pulse: 74 81  ?Resp: 18 20  ?Temp: 98.9 ?F (37.2 ?C) 99.1 ?F (37.3 ?C)  ?SpO2: 97% 96%  ? ?At this time, no change in plan from day progress note.  ?Holley Bouche, MD ?Page [phone] with questions about this patient.  ? ? ?

## 2021-09-24 NOTE — Progress Notes (Signed)
Occupational Therapy Treatment ?Patient Details ?Name: Jessica Mercado ?MRN: [id number] ?DOB: [birth year] ?Today's Date: 09/24/2021 ? ? ?History of present illness 61 y/o female presented to ED on 09/16/21 after fall at home on 4/23. Sustained a trimalleolar fx of L ankle. S/p ORIF of L ankle on 4/25. PMH: ESRD, HTN, T2DM, heart failure, bipolar disorder ?  ?OT comments ? Patient received in supine and agreeable to OT session. Patient was min assist to get to EOB and was able to perform grooming tasks without assistance for balance. Patient attempted one stand from EOB to RW with therapist placing his foot under her LLE to monitor WB. Patient had difficulty maintaining NWB and no other standing attempted. Patient is eager to progress to OOB mobility. Acute OT to continue to follow.   ? ?Recommendations for follow up therapy are one component of a multi-disciplinary discharge planning process, led by the attending physician.  Recommendations may be updated based on patient status, additional functional criteria and insurance authorization. ?   ?Follow Up Recommendations ? Skilled nursing-short term rehab (<3 hours/day)  ?  ?Assistance Recommended at Discharge Frequent or constant Supervision/Assistance  ?Patient can return home with the following ? Two people to help with walking and/or transfers;A lot of help with bathing/dressing/bathroom;Assistance with cooking/housework;Help with stairs or ramp for entrance;Assist for transportation ?  ?Equipment Recommendations ? BSC/3in1  ?  ?Recommendations for Other Services   ? ?  ?Precautions / Restrictions Precautions ?Precautions: Fall ?Required Braces or Orthoses: Splint/Cast ?Splint/Cast - Date Prophylactic Dressing Applied (if applicable): 75/64/33 ?Restrictions ?Weight Bearing Restrictions: Yes ?RLE Weight Bearing: Weight bearing as tolerated ?LLE Weight Bearing: Non weight bearing  ? ? ?  ? ?Mobility Bed Mobility ?Overal bed mobility: Needs Assistance ?Bed Mobility: Supine  to Sit, Sit to Supine ?  ?  ?Supine to sit: Min assist ?Sit to supine: Min assist ?  ?General bed mobility comments: assistance with LLE to get to EOB and back to supine ?  ? ?Transfers ?Overall transfer level: Needs assistance ?Equipment used: Rolling walker (2 wheels) ?Transfers: Sit to/from Stand ?Sit to Stand: Max assist ?  ?  ?  ?  ?  ?General transfer comment: stood once from EOB with threapist foot under LLE to moniter WB with patient having difficutly maintaining ?  ?  ?Balance Overall balance assessment: Needs assistance ?Sitting-balance support: No upper extremity supported, Feet supported ?Sitting balance-Leahy Scale: Fair ?Sitting balance - Comments: able to perform grooming tasks seated on EOB ?  ?  ?  ?  ?  ?  ?  ?  ?  ?  ?  ?  ?  ?  ?  ?   ? ?ADL either performed or assessed with clinical judgement  ? ?ADL Overall ADL's : Needs assistance/impaired ?  ?  ?Grooming: Wash/dry hands;Wash/dry face;Brushing hair;Supervision/safety;Sitting ?Grooming Details (indicate cue type and reason): seated on EOB ?  ?  ?  ?  ?  ?  ?Lower Body Dressing: Maximal assistance ?Lower Body Dressing Details (indicate cue type and reason): to donn sock on RLE ?  ?  ?  ?  ?  ?  ?  ?General ADL Comments: performed self care tasks seated on EOB ?  ? ?Extremity/Trunk Assessment   ?  ?  ?  ?  ?  ? ?Vision   ?  ?  ?Perception   ?  ?Praxis   ?  ? ?Cognition Arousal/Alertness: Awake/alert ?Behavior During Therapy: [facility] for tasks assessed/performed, Restless ?Overall Cognitive Status: No  family/caregiver present to determine baseline cognitive functioning ?  ?  ?  ?  ?  ?  ?  ?  ?  ?  ?  ?  ?  ?  ?  ?  ?  ?  ?  ?   ?Exercises   ? ?  ?Shoulder Instructions   ? ? ?  ?General Comments    ? ? ?Pertinent Vitals/ Pain       Pain Assessment ?Pain Assessment: Faces ?Faces Pain Scale: Hurts little more ?Pain Location: L ankle ?Pain Descriptors / Indicators: Grimacing ?Pain Intervention(s): Limited activity within patient's tolerance,  Repositioned, Monitored during session ? ?Home Living   ?  ?  ?  ?  ?  ?  ?  ?  ?  ?  ?  ?  ?  ?  ?  ?  ?  ?  ? ?  ?Prior Functioning/Environment    ?  ?  ?  ?   ? ?Frequency ? Min 2X/week  ? ? ? ? ?  ?Progress Toward Goals ? ?OT Goals(current goals can now be found in the care plan section) ? Progress towards OT goals: Progressing toward goals ? ?Acute Rehab OT Goals ?Patient Stated Goal: get better ?OT Goal Formulation: With patient ?Time For Goal Achievement: 10/03/21 ?Potential to Achieve Goals: Good ?ADL Goals ?Pt Will Perform Lower Body Dressing: with modified independence;with adaptive equipment;with caregiver independent in assisting;sitting/lateral leans;sit to/from stand ?Pt Will Transfer to Toilet: with modified independence;stand pivot transfer;ambulating;bedside commode ?Pt Will Perform Toileting - Clothing Manipulation and hygiene: with modified independence;with caregiver independent in assisting;sitting/lateral leans;sit to/from stand ?Additional ADL Goal #1: Pt will be Mod I NWB LLE during sit to stand transfers in preparation for increased participation with ADL's and fucntional tranasfers  ?Plan Discharge plan remains appropriate   ? ?Co-evaluation ? ? ?   ?  ?  ?  ?  ? ?  ?AM-PAC OT "6 Clicks" Daily Activity     ?Outcome Measure ? ? Help from another person eating meals?: None ?Help from another person taking care of personal grooming?: A Little ?Help from another person toileting, which includes using toliet, bedpan, or urinal?: A Lot ?Help from another person bathing (including washing, rinsing, drying)?: A Lot ?Help from another person to put on and taking off regular upper body clothing?: A Little ?Help from another person to put on and taking off regular lower body clothing?: A Lot ?6 Click Score: 16 ? ?  ?End of Session Equipment Utilized During Treatment: Gait belt;Rolling walker (2 wheels) ? ?OT Visit Diagnosis: Other abnormalities of gait and mobility (R26.89);Muscle weakness  (generalized) (M62.81);Pain ?Pain - Right/Left: Left ?Pain - part of body: Ankle and joints of foot ?  ?Activity Tolerance Patient tolerated treatment well ?  ?Patient Left in bed;with call bell/phone within reach;with bed alarm set ?  ?Nurse Communication Mobility status ?  ? ?   ? ?Time: 9622-2979 ?OT Time Calculation (min): 22 min ? ?Charges: OT General Charges ?$OT Visit: 1 Visit ?OT Treatments ?$Self Care/Home Management : 8-22 mins ? ?Lodema Hong, OTA ?Acute Rehabilitation Services  ?Pager [phone] ?Office [phone] ? ? ?[place] ?09/24/2021, 11:07 AM ?

## 2021-09-24 NOTE — TOC Progression Note (Addendum)
Transition of Care (TOC) - Progression Note  ? ? ?Patient Details  ?Name: Jessica Mercado ?MRN: [id number] ?Date of Birth: 1960/07/03 ? ?Transition of Care (TOC) CM/SW Contact  ?Tom-Johnson, Renea Ee, RN ?Phone Number: ?09/24/2021, 12:40 PM ? ?Clinical Narrative:    ? ?CM received a call from patient's son in-law, [name] that patient's home will be needing a  ramp and also home to be  handicapped accessible. SW notified of ramp.  ?Bill requests wheelchair and hospital bed. Order called to [employer] and should be delivered to patient's home. Patient is scheduled for HD catheter exchange today. CM will continue to follow with needs.  ? ?14:50- CM received another call from [place] that patient's [facility] was terminated on 04/30. States he is going to get the renewal form and fill out. CM noticed that her Primary Insurance now listed is Medicaid [place].  ?CM will continue to follow with needs. ?  ?Barriers to Discharge: Continued Medical Work up ? ?Expected Discharge Plan and Services ?  ?  ?Discharge Planning Services: CM Consult ?  ?Living arrangements for the past 2 months: [place] ?Expected Discharge Date: 09/20/21               ?  ?  ?  ?  ?  ?  ?  ?  ?  ?  ? ? ?Social Determinants of Health (SDOH) Interventions ?  ? ?Readmission Risk Interventions ? ?  07/20/2021  ?  4:39 PM  ?Readmission Risk Prevention Plan  ?Transportation Screening Complete  ?[place] or Home Care Consult Complete  ?Social Work Consult for [place] Planning/Counseling Complete  ?Palliative Care Screening Not Applicable  ?Medication Review Press photographer) Complete  ? ? ?

## 2021-09-24 NOTE — Progress Notes (Signed)
Patient ID: Jessica Mercado, female   DOB: Apr 01, 1961, 61 y.o.   MRN: [id number] ? ?[facility] ?Progress Note  ? ?Assessment/ Plan:   ?1. Falls/syncope: Suspected to be associated with worsening renal function/uremia and plans noted for admission to home with ?HHPT/HHOT upon DC after refusing DC to SNF. ?2. 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  Problems noted with tunneled hemodialysis catheter yesterday that did not improve with tPA and is on vascular surgery schedule today for catheter exchange. MWF HD- next dialysis tomorrow.  ?3. Anemia: Likely secondary to anemia of chronic disease, continue ongoing management with ESA.  Significant iron deficiency noted, will redose intravenous iron. 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 ?5. Nutrition: Continue renal diet with fluid restriction and ongoing oral nutritional supplements. ?6. Hypertension: Blood pressures continue to improve with hemodialysis, will continue to probe estimated dry weight. ? ?Subjective:   ?Reports to be feeling fair and informs me that she intends to go home after DC and not to a SNF.   ? ?Objective:   ?BP (!) 146/78   Pulse 69   Temp 98.2 ?F (36.8 ?C) (Oral)   Resp 14   Ht 5\' 7"  (1.702 m)   Wt 90.8 kg   LMP  (LMP Unknown)   SpO2 94%   BMI 31.35 kg/m?  ? ?Physical Exam: ?Gen: Comfortably resting in bed ?CVS: Pulse regular rhythm, normal rate, S1 and S2 normal ?Resp: Clear to auscultation bilaterally, no rales/rhonchi.  Right IJ TDC ?Abd: Soft, flat, nontender, bowel sounds normal ?Ext: Trace right lower extremity edema palpable, left forefoot with wound vac ? ?Labs: ?BMET ?Recent Labs  ?Lab 09/18/21 ?3536 09/19/21 ?0203 09/20/21 ?1443 09/23/21 ?0414  ?NA 137 137 134* 134*  ?K 4.3 3.8 3.7 3.4*  ?CL 99 99 99 99  ?CO2 27 27 28 25   ?GLUCOSE 168* 91 120* 137*  ?BUN 63* 38*  44* 39*  ?CREATININE 5.55* 4.13* 4.93* 4.92*  ?CALCIUM 8.2* 8.2* 8.3* 8.5*  ?PHOS 5.1* 2.5 2.9 4.1  ? ?CBC ?Recent Labs  ?Lab 09/18/21 ?1540 09/19/21 ?0203 09/20/21 ?0867 09/23/21 ?0414  ?WBC 9.9 12.0* 10.9* 12.4*  ?HGB 8.5* 7.8* 8.5* 8.5*  ?HCT 25.5* 24.5* 25.7* 26.1*  ?MCV 87.9 90.1 89.9 89.1  ?PLT 238 250 254 258  ? ? ?  ?Medications:   ? ? acetaminophen  500 mg Oral Q12H  ? atorvastatin  40 mg Oral Daily  ? benztropine  1 mg Oral QPM  ? Chlorhexidine Gluconate Cloth  6 each Topical Q0600  ? darbepoetin (ARANESP) injection - DIALYSIS  40 mcg Intravenous Q Mon-HD  ? FLUoxetine  60 mg Oral q morning  ? gabapentin  100 mg Oral Daily  ? heparin injection (subcutaneous)  5,000 Units Subcutaneous Q8H  ? insulin aspart  0-6 Units Subcutaneous TID WC  ? lurasidone  40 mg Oral QHS  ? metoprolol succinate  50 mg Oral Daily  ? multivitamin  1 tablet Oral QHS  ? prazosin  1 mg Oral QPM  ? traZODone  300 mg Oral QHS  ? ?Elmarie Shiley, MD ?09/24/2021, 9:48 AM  ? ?

## 2021-09-24 NOTE — Progress Notes (Addendum)
Continue to follow pt's case to advise out-pt HD clinic of pt's start date once d/c date is confirmed. Pt has been accepted at [facility] on MWF. For first appt, pt will need to arrive at 10:10 for 11:10 chair time. RN CM aware pt will need transportation to out-pt HD appts at d/c. Will assist as needed.  ? ?Melven Sartorius ?Renal Navigator ?[phone] ? ?Addendum at 11:29 am: ?Spoke to pt's son-in-law, [name], via phone. [name] confirms that he received text from navigator last week with pt's out-pt HD arrangements. Bill informed navigator that he must make pt's transportation arrangements 3 days in advance of transportation need. Sharen Heck that he needs to call transportation today to make appt for transportation to HD on Friday should pt d/c the next few days. Bill voices understanding and states he will call this afternoon to make arrangements. [name] states that landlord is not willing to assist/provide ramp for pt's use. RN CM and CSW aware of this info as well. ? ?Addendum at 2:00 pm: ?Notified by RN CM that pt's son-in-law, [name], contacted case manager this afternoon to say that he has been advised that pt's insurance policy terminated on 3/95/32. Navigator contacted [employer] admissions and financials with this information. Will have to await financial clearance/approval for pt to start at out-pt clinic due to change in insurance coverage. [name] demo sheet now showing traditional [place] medicaid. New demo faxed to [facility] admissions for review.  ?

## 2021-09-24 NOTE — Interval H&P Note (Signed)
History and Physical Interval Note: ? ?09/24/2021 ?1:07 PM ? ?Jessica Mercado  has presented today for surgery, with the diagnosis of occluded dialysis catheter.  The various methods of treatment have been discussed with the patient and family. After consideration of risks, benefits and other options for treatment, the patient has consented to  Procedure(s): ?EXCHANGE OF A TEMPORARY DIALYSIS CATHETER TO PERMANENT DIALYSIS CATHETER (N/A) as a surgical intervention.  The patient's history has been reviewed, patient examined, no change in status, stable for surgery.  I have reviewed the patient's chart and labs.  Questions were answered to the patient's satisfaction.   ? ? ?[name] ? ? ?
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
## 2021-09-24 NOTE — Anesthesia Preprocedure Evaluation (Signed)
Anesthesia Evaluation  ?Patient identified by MRN, date of birth, ID band ?Patient awake ? ? ? ?Reviewed: ?Allergy & Precautions, NPO status , Patient's Chart, lab work & pertinent test results ? ?Airway ?Mallampati: II ? ?TM Distance: >3 FB ?Neck ROM: Full ? ? ? Dental ? ?(+) Dental Advisory Given, Edentulous Lower, Edentulous Upper ?  ?Pulmonary ?sleep apnea ,  ?  ?Pulmonary exam normal ?breath sounds clear to auscultation ? ? ? ? ? ? Cardiovascular ?hypertension, Pt. on home beta blockers and Pt. on medications ?Normal cardiovascular exam ?Rhythm:Regular Rate:Normal ? ? ?  ?Neuro/Psych ?negative neurological ROS ? negative psych ROS  ? GI/Hepatic ?negative GI ROS, Neg liver ROS,   ?Endo/Other  ?diabetes, Type 2, Oral Hypoglycemic AgentsObesity ? ? Renal/GU ?ESRF and DialysisRenal disease  ? ?  ?Musculoskeletal ?negative musculoskeletal ROS ?(+)  ? Abdominal ?  ?Peds ? Hematology ? ?(+) Blood dyscrasia, anemia ,   ?Anesthesia Other Findings ?Day of surgery medications reviewed with the patient. ? Reproductive/Obstetrics ? ?  ? ? ? ? ? ? ? ? ? ? ? ? ? ?  ?  ? ? ? ? ? ? ? ? ?Anesthesia Physical ?Anesthesia Plan ? ?ASA: 3 ? ?Anesthesia Plan: General  ? ?Post-op Pain Management: Tylenol PO (pre-op)*  ? ?Induction: Intravenous ? ?PONV Risk Score and Plan: 3 and Dexamethasone and Ondansetron ? ?Airway Management Planned: LMA ? ?Additional Equipment:  ? ?Intra-op Plan:  ? ?Post-operative Plan: Extubation in OR ? ?Informed Consent: I have reviewed the patients History and Physical, chart, labs and discussed the procedure including the risks, benefits and alternatives for the proposed anesthesia with the patient or authorized representative who has indicated his/her understanding and acceptance.  ? ?Patient has DNR.  ?Discussed DNR with patient and Suspend DNR. ?  ?Dental advisory given ? ?Plan Discussed with: CRNA ? ?Anesthesia Plan Comments:   ? ? ? ? ? ? ?Anesthesia Quick Evaluation ? ?

## 2021-09-24 NOTE — Anesthesia Procedure Notes (Signed)
Procedure Name: LMA Insertion ?Date/Time: 09/24/2021 1:29 PM ?Performed by: Harden Mo, CRNA ?Pre-anesthesia Checklist: Patient identified, Emergency Drugs available, Suction available and Patient being monitored ?Patient Re-evaluated:Patient Re-evaluated prior to induction ?Oxygen Delivery Method: Circle System Utilized ?Preoxygenation: Pre-oxygenation with 100% oxygen ?Induction Type: IV induction ?LMA: LMA inserted ?LMA Size: 4.0 ?Number of attempts: 1 ?Airway Equipment and Method: Bite block ?Placement Confirmation: positive ETCO2 and breath sounds checked- equal and bilateral ?Tube secured with: Tape ?Dental Injury: Teeth and Oropharynx as per pre-operative assessment  ? ? ? ? ?

## 2021-09-24 NOTE — Op Note (Signed)
? ? ?  NAME: [name]    MRN: [id number] ?DOB: [birth year]    DATE OF OPERATION: 09/24/2021 ? ?PREOP DIAGNOSIS:   ? ?End-stage renal disease ? ?POSTOP DIAGNOSIS:   ? ?Same ? ?PROCEDURE:  ?  ?Exchange of right IJ tunneled dialysis catheter (19 cm) ? ?SURGEON: Judeth Cornfield. Scot Dock, MD ? ?ASSIST: None ? ?ANESTHESIA: General ? ?EBL: Minimal ? ?INDICATIONS:  ? ? Jessica Mercado is a 61 y.o. female who had a nonfunctioning dialysis catheter.  I was asked to exchange this ? ?FINDINGS:  ? ?Both catheters withdrew easily when flushed with heparinized saline. ? ?TECHNIQUE:  ? ?The patient was taken to the operating room and I looked with the SonoSite to be sure the left IJ was patent.  The neck and upper chest were prepped and draped in usual sterile fashion after she received a general anesthetic.  Under fluoroscopy I visualized the old catheter which appeared to be in excellent position.  I therefore elected to exchange this over a wire.  The previous incision in the neck was opened and the catheter was grasped with a hemostat and clamped.  The distal aspect was divided and removed.  A guidewire was advanced through the catheter and the catheter removed.  A new catheter was tunneled to the previous tunnel.  The dilator and peel-away sheath were advanced over the wire and the wire and dilator removed.  The new catheter was passed to the peel-away sheath in position at the caval atrial junction.  Both ports withdrew easily with and flushed with heparinized saline.  There was no kink in the catheter.  I looked in multiple projections.  The catheter was secured at its exit site with a 3-0 nylon suture.  The IJ cannulation site was closed with a 4-0 Monocryl.  Sterile dressing was applied.  The patient tolerated the procedure well was transferred to the recovery room in stable condition.  All needle and sponge counts were correct. ? ? ? ?[name], MD, FACS ?[facility] ? ?DATE OF  DICTATION:   09/24/2021 ? ?

## 2021-09-24 NOTE — Progress Notes (Signed)
[facility] ? ?Subjective  -no complaints ? ? ?Objective ?127/69 ?70 ?98.3 ?F (36.8 ?C) (Axillary) ?14 ?95% ? ?Intake/Output Summary (Last 24 hours) at 09/24/2021 0630 ?Last data filed at 09/24/2021 0600 ?Gross per 24 hour  ?Intake 680 ml  ?Output 1750 ml  ?Net -1070 ml  ? ? ?Right IJ TDC in place ? ?Laboratory ?Lab Results: ?Recent Labs  ?  09/23/21 ?5110  ?WBC 12.4*  ?HGB 8.5*  ?HCT 26.1*  ?PLT 258  ? ?BMET ?Recent Labs  ?  09/23/21 ?0414  ?NA 134*  ?K 3.4*  ?CL 99  ?CO2 25  ?GLUCOSE 137*  ?BUN 39*  ?CREATININE 4.92*  ?CALCIUM 8.5*  ? ? ?COAG ?Lab Results  ?Component Value Date  ? INR 1.1 09/16/2021  ? ?No results found for: PTT ? ?Assessment/Planning: ? ?61 year old female with end-stage renal disease that vascular surgery was consulted yesterday for exchange of her [facility] that was placed on 4/25 by Dr. Donzetta Matters and is not working.  I did discuss with nephrology Dr. Posey Pronto and he states even after cath flow did not get good flow volumes.  She is scheduled today with Dr. Scot Dock for [facility] exchange.  Please keep n.p.o. after midnight.  In review of the notes previous plan was outpatient follow-up for permanent hemodialysis access evaluation. ? ?Marty Heck ?09/24/2021 ?6:30 AM ?-- ? ? ?

## 2021-09-25 ENCOUNTER — Other Ambulatory Visit: Payer: Self-pay

## 2021-09-25 ENCOUNTER — Encounter (HOSPITAL_COMMUNITY): Payer: Self-pay | Admitting: Vascular Surgery

## 2021-09-25 ENCOUNTER — Other Ambulatory Visit (HOSPITAL_COMMUNITY): Payer: Self-pay

## 2021-09-25 ENCOUNTER — Encounter (HOSPITAL_COMMUNITY): Payer: Self-pay | Admitting: Physical Medicine and Rehabilitation

## 2021-09-25 ENCOUNTER — Inpatient Hospital Stay (HOSPITAL_COMMUNITY)
Admission: RE | Admit: 2021-09-25 | Discharge: 2021-10-10 | DRG: 981 | Disposition: A | Discharge status: Home / Self Care | Payer: Medicaid Other | Source: Intra-hospital | Attending: Physical Medicine and Rehabilitation | Admitting: Physical Medicine and Rehabilitation

## 2021-09-25 DIAGNOSIS — N186 End stage renal disease: Secondary | ICD-10-CM | POA: Diagnosis present

## 2021-09-25 DIAGNOSIS — F319 Bipolar disorder, unspecified: Secondary | ICD-10-CM | POA: Diagnosis present

## 2021-09-25 DIAGNOSIS — R269 Unspecified abnormalities of gait and mobility: Secondary | ICD-10-CM | POA: Diagnosis present

## 2021-09-25 DIAGNOSIS — S82842S Displaced bimalleolar fracture of left lower leg, sequela: Secondary | ICD-10-CM

## 2021-09-25 DIAGNOSIS — Z6832 Body mass index (BMI) 32.0-32.9, adult: Secondary | ICD-10-CM | POA: Diagnosis not present

## 2021-09-25 DIAGNOSIS — L8990 Pressure ulcer of unspecified site, unspecified stage: Secondary | ICD-10-CM | POA: Insufficient documentation

## 2021-09-25 DIAGNOSIS — E118 Type 2 diabetes mellitus with unspecified complications: Secondary | ICD-10-CM | POA: Diagnosis present

## 2021-09-25 DIAGNOSIS — Z7984 Long term (current) use of oral hypoglycemic drugs: Secondary | ICD-10-CM

## 2021-09-25 DIAGNOSIS — I953 Hypotension of hemodialysis: Secondary | ICD-10-CM | POA: Diagnosis not present

## 2021-09-25 DIAGNOSIS — W19XXXA Unspecified fall, initial encounter: Secondary | ICD-10-CM

## 2021-09-25 DIAGNOSIS — Z794 Long term (current) use of insulin: Secondary | ICD-10-CM

## 2021-09-25 DIAGNOSIS — L89152 Pressure ulcer of sacral region, stage 2: Secondary | ICD-10-CM | POA: Diagnosis present

## 2021-09-25 DIAGNOSIS — E785 Hyperlipidemia, unspecified: Secondary | ICD-10-CM | POA: Diagnosis present

## 2021-09-25 DIAGNOSIS — Z79899 Other long term (current) drug therapy: Secondary | ICD-10-CM | POA: Diagnosis not present

## 2021-09-25 DIAGNOSIS — G20 Parkinson's disease: Secondary | ICD-10-CM | POA: Diagnosis present

## 2021-09-25 DIAGNOSIS — G8929 Other chronic pain: Secondary | ICD-10-CM | POA: Diagnosis present

## 2021-09-25 DIAGNOSIS — W19XXXD Unspecified fall, subsequent encounter: Secondary | ICD-10-CM | POA: Diagnosis present

## 2021-09-25 DIAGNOSIS — E113293 Type 2 diabetes mellitus with mild nonproliferative diabetic retinopathy without macular edema, bilateral: Secondary | ICD-10-CM | POA: Diagnosis present

## 2021-09-25 DIAGNOSIS — Z4689 Encounter for fitting and adjustment of other specified devices: Secondary | ICD-10-CM | POA: Diagnosis not present

## 2021-09-25 DIAGNOSIS — D631 Anemia in chronic kidney disease: Secondary | ICD-10-CM

## 2021-09-25 DIAGNOSIS — E871 Hypo-osmolality and hyponatremia: Secondary | ICD-10-CM | POA: Diagnosis not present

## 2021-09-25 DIAGNOSIS — G2581 Restless legs syndrome: Secondary | ICD-10-CM

## 2021-09-25 DIAGNOSIS — R2681 Unsteadiness on feet: Secondary | ICD-10-CM

## 2021-09-25 DIAGNOSIS — I120 Hypertensive chronic kidney disease with stage 5 chronic kidney disease or end stage renal disease: Secondary | ICD-10-CM | POA: Diagnosis present

## 2021-09-25 DIAGNOSIS — Z8701 Personal history of pneumonia (recurrent): Secondary | ICD-10-CM

## 2021-09-25 DIAGNOSIS — Z833 Family history of diabetes mellitus: Secondary | ICD-10-CM

## 2021-09-25 DIAGNOSIS — N179 Acute kidney failure, unspecified: Secondary | ICD-10-CM | POA: Diagnosis not present

## 2021-09-25 DIAGNOSIS — S82852D Displaced trimalleolar fracture of left lower leg, subsequent encounter for closed fracture with routine healing: Principal | ICD-10-CM

## 2021-09-25 DIAGNOSIS — K5900 Constipation, unspecified: Secondary | ICD-10-CM | POA: Diagnosis not present

## 2021-09-25 DIAGNOSIS — S82852A Displaced trimalleolar fracture of left lower leg, initial encounter for closed fracture: Principal | ICD-10-CM

## 2021-09-25 DIAGNOSIS — N189 Chronic kidney disease, unspecified: Secondary | ICD-10-CM

## 2021-09-25 DIAGNOSIS — S82851A Displaced trimalleolar fracture of right lower leg, initial encounter for closed fracture: Secondary | ICD-10-CM

## 2021-09-25 DIAGNOSIS — E669 Obesity, unspecified: Secondary | ICD-10-CM | POA: Diagnosis present

## 2021-09-25 DIAGNOSIS — E1169 Type 2 diabetes mellitus with other specified complication: Secondary | ICD-10-CM | POA: Diagnosis not present

## 2021-09-25 DIAGNOSIS — M25512 Pain in left shoulder: Secondary | ICD-10-CM | POA: Diagnosis present

## 2021-09-25 DIAGNOSIS — G8918 Other acute postprocedural pain: Secondary | ICD-10-CM | POA: Diagnosis not present

## 2021-09-25 DIAGNOSIS — E1122 Type 2 diabetes mellitus with diabetic chronic kidney disease: Secondary | ICD-10-CM | POA: Diagnosis present

## 2021-09-25 DIAGNOSIS — E11311 Type 2 diabetes mellitus with unspecified diabetic retinopathy with macular edema: Secondary | ICD-10-CM

## 2021-09-25 DIAGNOSIS — R112 Nausea with vomiting, unspecified: Secondary | ICD-10-CM | POA: Diagnosis not present

## 2021-09-25 DIAGNOSIS — N19 Unspecified kidney failure: Secondary | ICD-10-CM

## 2021-09-25 DIAGNOSIS — G4700 Insomnia, unspecified: Secondary | ICD-10-CM | POA: Diagnosis not present

## 2021-09-25 DIAGNOSIS — Z992 Dependence on renal dialysis: Secondary | ICD-10-CM | POA: Diagnosis not present

## 2021-09-25 DIAGNOSIS — I129 Hypertensive chronic kidney disease with stage 1 through stage 4 chronic kidney disease, or unspecified chronic kidney disease: Secondary | ICD-10-CM

## 2021-09-25 DIAGNOSIS — R32 Unspecified urinary incontinence: Secondary | ICD-10-CM | POA: Diagnosis not present

## 2021-09-25 DIAGNOSIS — Z881 Allergy status to other antibiotic agents status: Secondary | ICD-10-CM

## 2021-09-25 LAB — GLUCOSE, CAPILLARY
Glucose-Capillary: 185 mg/dL — ABNORMAL HIGH (ref 70–99)
Glucose-Capillary: 185 mg/dL — ABNORMAL HIGH (ref 70–99)
Glucose-Capillary: 218 mg/dL — ABNORMAL HIGH (ref 70–99)
Glucose-Capillary: 198 mg/dL — ABNORMAL HIGH (ref 70–99)

## 2021-09-25 LAB — CBC
HCT: 27.3 % — ABNORMAL LOW (ref 36.0–46.0)
HCT: 26.9 % — ABNORMAL LOW (ref 36.0–46.0)
Hemoglobin: 9.1 g/dL — ABNORMAL LOW (ref 12.0–15.0)
Hemoglobin: 9.1 g/dL — ABNORMAL LOW (ref 12.0–15.0)
MCH: 29.9 pg (ref 26.0–34.0)
MCH: 30.4 pg (ref 26.0–34.0)
MCHC: 33.3 g/dL (ref 30.0–36.0)
MCHC: 33.8 g/dL (ref 30.0–36.0)
MCV: 89.8 fL (ref 80.0–100.0)
MCV: 90 fL (ref 80.0–100.0)
Platelets: 228 10*3/uL (ref 150–400)
Platelets: 235 10*3/uL (ref 150–400)
RBC: 3.04 MIL/uL — ABNORMAL LOW (ref 3.87–5.11)
RBC: 2.99 MIL/uL — ABNORMAL LOW (ref 3.87–5.11)
RDW: 14.3 % (ref 11.5–15.5)
RDW: 14.5 % (ref 11.5–15.5)
WBC: 12.4 10*3/uL — ABNORMAL HIGH (ref 4.0–10.5)
WBC: 13.5 10*3/uL — ABNORMAL HIGH (ref 4.0–10.5)
nRBC: 0.2 % (ref 0.0–0.2)
nRBC: 0.1 % (ref 0.0–0.2)

## 2021-09-25 MED ORDER — INSULIN ASPART 100 UNIT/ML IJ SOLN
0.0000 [IU] | Freq: Three times a day (TID) | INTRAMUSCULAR | Status: DC
  Administered 2021-09-25: 2 [IU] via SUBCUTANEOUS
  Administered 2021-09-26 (×2): 1 [IU] via SUBCUTANEOUS
  Administered 2021-09-26 – 2021-09-27 (×2): 2 [IU] via SUBCUTANEOUS
  Administered 2021-09-27: 1 [IU] via SUBCUTANEOUS
  Administered 2021-09-28: 2 [IU] via SUBCUTANEOUS
  Administered 2021-09-28: 1 [IU] via SUBCUTANEOUS
  Administered 2021-09-28: 2 [IU] via SUBCUTANEOUS
  Administered 2021-09-29 (×2): 1 [IU] via SUBCUTANEOUS
  Administered 2021-09-29: 2 [IU] via SUBCUTANEOUS
  Administered 2021-09-30: 3 [IU] via SUBCUTANEOUS
  Administered 2021-09-30: 1 [IU] via SUBCUTANEOUS
  Administered 2021-10-01: 2 [IU] via SUBCUTANEOUS
  Administered 2021-10-01: 1 [IU] via SUBCUTANEOUS
  Administered 2021-10-01: 2 [IU] via SUBCUTANEOUS
  Administered 2021-10-02: 1 [IU] via SUBCUTANEOUS
  Administered 2021-10-02: 2 [IU] via SUBCUTANEOUS
  Administered 2021-10-03 (×2): 4 [IU] via SUBCUTANEOUS
  Administered 2021-10-04: 1 [IU] via SUBCUTANEOUS
  Administered 2021-10-04: 2 [IU] via SUBCUTANEOUS
  Administered 2021-10-04: 1 [IU] via SUBCUTANEOUS
  Administered 2021-10-05: 2 [IU] via SUBCUTANEOUS
  Administered 2021-10-05: 1 [IU] via SUBCUTANEOUS
  Administered 2021-10-05: 2 [IU] via SUBCUTANEOUS
  Administered 2021-10-06 (×2): 1 [IU] via SUBCUTANEOUS
  Administered 2021-10-06 – 2021-10-07 (×2): 2 [IU] via SUBCUTANEOUS
  Administered 2021-10-07: 3 [IU] via SUBCUTANEOUS
  Administered 2021-10-08: 1 [IU] via SUBCUTANEOUS
  Administered 2021-10-08: 2 [IU] via SUBCUTANEOUS
  Administered 2021-10-09 – 2021-10-10 (×2): 1 [IU] via SUBCUTANEOUS

## 2021-09-25 MED ORDER — ACETAMINOPHEN 500 MG PO TABS
500.0000 mg | ORAL_TABLET | Freq: Two times a day (BID) | ORAL | Status: DC
Start: 2021-09-25 — End: 2021-10-03
  Administered 2021-09-25 – 2021-10-02 (×15): 500 mg via ORAL
  Filled 2021-09-25 (×16): qty 1

## 2021-09-25 MED ORDER — GABAPENTIN 100 MG PO CAPS
100.0000 mg | ORAL_CAPSULE | Freq: Every day | ORAL | Status: DC
Start: 2021-09-26 — End: 2021-10-10
  Administered 2021-09-26 – 2021-10-10 (×14): 100 mg via ORAL
  Filled 2021-09-25 (×14): qty 1

## 2021-09-25 MED ORDER — RENA-VITE PO TABS
1.0000 | ORAL_TABLET | Freq: Every day | ORAL | Status: DC
  Administered 2021-09-25 – 2021-10-09 (×15): 1 via ORAL
  Filled 2021-09-25 (×15): qty 1

## 2021-09-25 MED ORDER — BENZTROPINE MESYLATE 1 MG PO TABS
1.0000 mg | ORAL_TABLET | Freq: Every evening | ORAL | Status: DC
Start: 2021-09-25 — End: 2021-10-10
  Administered 2021-09-25 – 2021-10-10 (×16): 1 mg via ORAL
  Filled 2021-09-25 (×16): qty 1

## 2021-09-25 MED ORDER — LIVING WELL WITH DIABETES BOOK
Freq: Once | Status: AC
  Filled 2021-09-25: qty 1

## 2021-09-25 MED ORDER — KIDNEY FAILURE BOOK: Freq: Once | Status: AC

## 2021-09-25 MED ORDER — HEPARIN SODIUM (PORCINE) 5000 UNIT/ML IJ SOLN
5000.0000 [IU] | Freq: Three times a day (TID) | INTRAMUSCULAR | Status: DC
  Administered 2021-09-25 – 2021-09-27 (×5): 5000 [IU] via SUBCUTANEOUS
  Filled 2021-09-25 (×5): qty 1

## 2021-09-25 MED ORDER — HEPARIN SODIUM (PORCINE) 1000 UNIT/ML DIALYSIS
1000.0000 [IU] | INTRAMUSCULAR | Status: DC | PRN
  Filled 2021-09-25 (×2): qty 1

## 2021-09-25 MED ORDER — PROCHLORPERAZINE EDISYLATE 10 MG/2ML IJ SOLN
5.0000 mg | Freq: Four times a day (QID) | INTRAMUSCULAR | Status: DC | PRN
  Administered 2021-10-08: 10 mg via INTRAMUSCULAR
  Filled 2021-09-25: qty 2

## 2021-09-25 MED ORDER — PRAZOSIN HCL 1 MG PO CAPS
1.0000 mg | ORAL_CAPSULE | Freq: Every evening | ORAL | Status: DC
  Administered 2021-09-26 – 2021-09-29 (×3): 1 mg via ORAL
  Filled 2021-09-25 (×6): qty 1

## 2021-09-25 MED ORDER — HEPARIN SODIUM (PORCINE) 1000 UNIT/ML DIALYSIS: 40.0000 [IU]/kg | INTRAMUSCULAR | Status: DC | PRN

## 2021-09-25 MED ORDER — ALTEPLASE 2 MG IJ SOLR: 2.0000 mg | Freq: Once | INTRAMUSCULAR | Status: DC | PRN

## 2021-09-25 MED ORDER — MILK AND MOLASSES ENEMA
1.0000 | Freq: Once | RECTAL | Status: AC
  Administered 2021-09-25: 240 mL via RECTAL
  Filled 2021-09-25: qty 240

## 2021-09-25 MED ORDER — PROCHLORPERAZINE MALEATE 5 MG PO TABS
5.0000 mg | ORAL_TABLET | Freq: Four times a day (QID) | ORAL | Status: DC | PRN
  Administered 2021-10-03 – 2021-10-05 (×5): 10 mg via ORAL
  Filled 2021-09-25 (×5): qty 2

## 2021-09-25 MED ORDER — LIDOCAINE-PRILOCAINE 2.5-2.5 % EX CREA: 1.0000 "application " | TOPICAL_CREAM | CUTANEOUS | Status: DC | PRN

## 2021-09-25 MED ORDER — FLUOXETINE HCL 20 MG PO CAPS
60.0000 mg | ORAL_CAPSULE | Freq: Every morning | ORAL | Status: DC
  Administered 2021-09-26 – 2021-10-10 (×15): 60 mg via ORAL
  Filled 2021-09-25 (×16): qty 3

## 2021-09-25 MED ORDER — APIXABAN 2.5 MG PO TABS
2.5000 mg | ORAL_TABLET | Freq: Two times a day (BID) | ORAL | 0 refills | Status: DC
  Filled 2021-09-25: qty 60, 30d supply, fill #0

## 2021-09-25 MED ORDER — DARBEPOETIN ALFA 40 MCG/0.4ML IJ SOSY
40.0000 ug | PREFILLED_SYRINGE | INTRAMUSCULAR | Status: DC
  Filled 2021-09-25: qty 0.4

## 2021-09-25 MED ORDER — INSULIN ASPART 100 UNIT/ML IJ SOLN
0.0000 [IU] | Freq: Every day | INTRAMUSCULAR | Status: DC
  Administered 2021-09-26 – 2021-10-06 (×10): 2 [IU] via SUBCUTANEOUS

## 2021-09-25 MED ORDER — POLYETHYLENE GLYCOL 3350 17 G PO PACK: 17.0000 g | PACK | Freq: Every day | ORAL | Status: DC | PRN

## 2021-09-25 MED ORDER — SODIUM CHLORIDE 0.9 % IV SOLN: 100.0000 mL | INTRAVENOUS | Status: DC | PRN

## 2021-09-25 MED ORDER — GUAIFENESIN-DM 100-10 MG/5ML PO SYRP: 5.0000 mL | ORAL_SOLUTION | Freq: Four times a day (QID) | ORAL | Status: DC | PRN

## 2021-09-25 MED ORDER — TRAZODONE HCL 50 MG PO TABS
300.0000 mg | ORAL_TABLET | Freq: Every day | ORAL | Status: DC
  Administered 2021-09-25 – 2021-10-09 (×15): 300 mg via ORAL
  Filled 2021-09-25 (×15): qty 6

## 2021-09-25 MED ORDER — PROCHLORPERAZINE 25 MG RE SUPP
12.5000 mg | Freq: Four times a day (QID) | RECTAL | Status: DC | PRN
Start: 2021-09-25 — End: 2021-10-10

## 2021-09-25 MED ORDER — ATORVASTATIN CALCIUM 40 MG PO TABS
40.0000 mg | ORAL_TABLET | Freq: Every day | ORAL | Status: DC
  Administered 2021-09-26 – 2021-10-10 (×14): 40 mg via ORAL
  Filled 2021-09-25 (×14): qty 1

## 2021-09-25 MED ORDER — CHLORHEXIDINE GLUCONATE CLOTH 2 % EX PADS
6.0000 | MEDICATED_PAD | Freq: Two times a day (BID) | CUTANEOUS | Status: DC
  Administered 2021-09-25 – 2021-09-29 (×8): 6 via TOPICAL

## 2021-09-25 MED ORDER — BISACODYL 10 MG RE SUPP
10.0000 mg | Freq: Every day | RECTAL | Status: DC | PRN
  Administered 2021-09-29: 10 mg via RECTAL
  Filled 2021-09-25: qty 1

## 2021-09-25 MED ORDER — LURASIDONE HCL 20 MG PO TABS
40.0000 mg | ORAL_TABLET | Freq: Every day | ORAL | Status: DC
  Administered 2021-09-25 – 2021-10-09 (×15): 40 mg via ORAL
  Filled 2021-09-25 (×17): qty 2

## 2021-09-25 MED ORDER — SIMETHICONE 80 MG PO CHEW: 80.0000 mg | CHEWABLE_TABLET | Freq: Four times a day (QID) | ORAL | Status: DC | PRN

## 2021-09-25 MED ORDER — CALCIUM CARBONATE ANTACID 500 MG PO CHEW
1.0000 | CHEWABLE_TABLET | Freq: Four times a day (QID) | ORAL | Status: DC | PRN
  Administered 2021-10-05: 200 mg via ORAL
  Filled 2021-09-25: qty 1

## 2021-09-25 MED ORDER — DIPHENHYDRAMINE HCL 12.5 MG/5ML PO ELIX: 12.5000 mg | ORAL_SOLUTION | Freq: Four times a day (QID) | ORAL | Status: DC | PRN

## 2021-09-25 MED ORDER — LIDOCAINE HCL (PF) 1 % IJ SOLN: 5.0000 mL | INTRAMUSCULAR | Status: DC | PRN

## 2021-09-25 MED ORDER — ACETAMINOPHEN 325 MG PO TABS
325.0000 mg | ORAL_TABLET | ORAL | Status: DC | PRN
  Filled 2021-09-25: qty 2

## 2021-09-25 MED ORDER — METOPROLOL SUCCINATE ER 50 MG PO TB24
50.0000 mg | ORAL_TABLET | Freq: Every day | ORAL | Status: DC
  Administered 2021-09-26 – 2021-10-10 (×11): 50 mg via ORAL
  Filled 2021-09-25 (×12): qty 1

## 2021-09-25 MED ORDER — PROSOURCE PLUS PO LIQD: 30.0000 mL | Freq: Two times a day (BID) | ORAL | Status: DC

## 2021-09-25 MED ORDER — SODIUM CHLORIDE 0.9 % IV SOLN
125.0000 mg | INTRAVENOUS | Status: AC
  Administered 2021-09-27 – 2021-10-07 (×5): 125 mg via INTRAVENOUS
  Filled 2021-09-25 (×8): qty 10

## 2021-09-25 MED ORDER — HEPARIN SODIUM (PORCINE) 1000 UNIT/ML IJ SOLN
INTRAMUSCULAR | Status: AC
  Administered 2021-09-25: 1000 [IU] via INTRAVENOUS_CENTRAL
  Filled 2021-09-25: qty 4

## 2021-09-25 MED ORDER — PENTAFLUOROPROP-TETRAFLUOROETH EX AERO: 1.0000 "application " | INHALATION_SPRAY | CUTANEOUS | Status: DC | PRN

## 2021-09-25 MED ORDER — OXYCODONE-ACETAMINOPHEN 5-325 MG PO TABS
1.0000 | ORAL_TABLET | ORAL | Status: DC | PRN
  Administered 2021-09-26 (×2): 1 via ORAL
  Filled 2021-09-25 (×2): qty 1

## 2021-09-25 NOTE — Progress Notes (Addendum)
Physical Therapy Treatment ?Patient Details ?Name: Jessica Mercado ?MRN: [id number] ?DOB: 1960/10/27 ?Today's Date: 09/25/2021 ? ? ?History of Present Illness 61 y/o female presented to ED on 09/16/21 after fall at home on 4/23. Sustained a trimalleolar fx of L ankle. S/p ORIF of L ankle on 4/25. PMH: ESRD, HTN, T2DM, heart failure, bipolar disorder ? ?  ?PT Comments  ? ? Continuing work on functional mobility and activity tolerance;  Pt is very motivated to get home, and participated in transfer training with 2 different situations: one with armrests up, and one with armrest down; Lots of problem-solving for positioning to keep weight off of LLE, and ultimately, she performs lateral scoot transfer better to a recliner with armrest down; Up to mod assist to monitor and support LLE to keep weight off of foot -- able to turn trunk so that she could keep her LLE on the bed while transferring to HD recliner with armrest down on her Right;  ? ?She is showing progress, and will benefit from continuing rehab to maximize independence and safety with mobility and ADLs in prep for getting home with supportive family; Notified Team of updated dc recommendations  ?Recommendations for follow up therapy are one component of a multi-disciplinary discharge planning process, led by the attending physician.  Recommendations may be updated based on patient status, additional functional criteria and insurance authorization. ? ?Follow Up Recommendations ? Acute inpatient rehab (3hours/day) ?  ?  ?Assistance Recommended at Discharge Frequent or constant Supervision/Assistance  ?Patient can return home with the following Two people to help with walking and/or transfers;A lot of help with bathing/dressing/bathroom;Assistance with cooking/housework;Assist for transportation ?  ?Equipment Recommendations ? BSC/3in1;Wheelchair cushion (measurements PT);Wheelchair (measurements PT) (drop-arm 3in1, sliding bd)  ?  ?Recommendations for Other Services  Rehab consult ? ? ?  ?Precautions / Restrictions Precautions ?Precautions: Fall ?Required Braces or Orthoses: Splint/Cast ?Splint/Cast - Date Prophylactic Dressing Applied (if applicable): 40/98/11 ?Restrictions ?RLE Weight Bearing: Weight bearing as tolerated ?LLE Weight Bearing: Non weight bearing  ?  ? ?Mobility ? Bed Mobility ?Overal bed mobility: Needs Assistance ?Bed Mobility: Supine to Sit ?  ?  ?Supine to sit: Min guard ?  ?  ?General bed mobility comments: Close guard for LLE and NPWT line; Able to move LLE well; pulls to semi-longsit with rail without assist, and scoots to EOB without physical assist ?  ? ?Transfers ?Overall transfer level: Needs assistance ?  ?Transfers: Bed to chair/wheelchair/BSC ?  ?  ?  ?  ?  ? Lateral/Scoot Transfers: Mod assist (Light mod assist, mostly for monitoring for NWB LLE) ?General transfer comment: Initiated transfer training with getting to /from BSC, placed directly adjacent and nearly facing bed, to allow for RLE to be on the floor for a pivot point, and to keep LLE supported on the bed; Lots of cues for technique; pt performed scooting, scoot turns in the bed, and attempted lots of positions/angles; Therapists giving multiple cues for safe pivot and/or anterior-posterior scooting; Ultimately, shifted to performing lateral scoot transfer with Up to mod assist to monitor and support LLE to keep weight off of foot -- able to turn trunk so that she could keep her LLE on the bed while transferring to HD recliner with armrest down on her Right ?  ? ?Ambulation/Gait ?  ?  ?  ?  ?  ?  ?  ?  ? ? ?Stairs ?  ?  ?  ?  ?  ? ? ?Wheelchair Mobility ?  ? ?  Modified Rankin (Stroke Patients Only) ?  ? ? ?  ?Balance   ?  ?  ?  ?  ?  ?  ?  ?  ?  ?  ?  ?  ?  ?  ?  ?  ?  ?  ?  ? ?  ?Cognition Arousal/Alertness: Awake/alert ?Behavior During Therapy: [facility] for tasks assessed/performed, Restless ?Overall Cognitive Status: No family/caregiver present to determine baseline cognitive  functioning ?Area of Impairment: Problem solving ?  ?  ?  ?  ?  ?  ?  ?  ?  ?  ?  ?  ?  ?  ?Problem Solving: Difficulty sequencing, Requires verbal cues, Requires tactile cues ?General Comments: Able to verbalize NWB restriction of LLE, however tends to touchdown and shift weight onto L foot during transfers; referred to her previous experience at post-acute often during session -- her movement patterns seem to harken back to previous rehab experience when she was NWB RLE ?  ?  ? ?  ?Exercises   ? ?  ?General Comments General comments (skin integrity, edema, etc.): Very focused on getting home; Agrees to transfer to CIR to work on getting home ?  ?  ? ?Pertinent Vitals/Pain Pain Assessment ?Pain Assessment: Faces ?Faces Pain Scale: Hurts a little bit ?Pain Location: L ankle ?Pain Descriptors / Indicators: Grimacing ?Pain Intervention(s): Monitored during session  ? ? ?Home Living Family/patient expects to be discharged to:: Private residence ?Living Arrangements: Other relatives (Son in law lives with pt) ?Available Help at Discharge: Family;Available PRN/intermittently ?Type of Home: House ?Home Access: Level entry ?  ?  ?  ?Home Layout: One level ?Home Equipment: Kasandra Knudsen - single point;Shower seat;Rollator (4 wheels) ?   ?  ?Prior Function    ?  ?  ?   ? ?PT Goals (current goals can now be found in the care plan section) Acute Rehab PT Goals ?Patient Stated Goal: to go home and my son in law will take care of me ?PT Goal Formulation: With patient ?Time For Goal Achievement: 10/03/21 ?Potential to Achieve Goals: Fair ?Progress towards PT goals: Progressing toward goals ? ?  ?Frequency ? ? ? Min 3X/week ? ? ? ?  ?PT Plan Discharge plan needs to be updated;Frequency needs to be updated  ? ? ?Co-evaluation PT/OT/SLP Co-Evaluation/Treatment: Yes ?Reason for Co-Treatment: Complexity of the patient's impairments (multi-system involvement);To address functional/ADL transfers;For patient/therapist safety ?PT goals addressed  during session: Mobility/safety with mobility ?OT goals addressed during session: ADL's and self-care;Proper use of Adaptive equipment and DME;Other (comment) (Transfers in preparation for increased participation with ADL's; maintaining WB precautions) ?  ? ?  ?AM-PAC PT "6 Clicks" Mobility   ?Outcome Measure ? Help needed turning from your back to your side while in a flat bed without using bedrails?: A Little ?Help needed moving from lying on your back to sitting on the side of a flat bed without using bedrails?: A Little ?Help needed moving to and from a bed to a chair (including a wheelchair)?: Total ?Help needed standing up from a chair using your arms (e.g., wheelchair or bedside chair)?: Total ?Help needed to walk in hospital room?: Total ?Help needed climbing 3-5 steps with a railing? : Total ?6 Click Score: 10 ? ?  ?End of Session Equipment Utilized During Treatment: Gait belt ?Activity Tolerance: Patient tolerated treatment well ?Patient left: in chair;with call bell/phone within reach;with chair alarm set (in HD recliner) ?Nurse Communication: Mobility status ?PT Visit Diagnosis: Muscle weakness (  generalized) (M62.81);Ataxic gait (R26.0);History of falling (Z91.81);Other abnormalities of gait and mobility (R26.89);Unsteadiness on feet (R26.81) ?  ? ? ?Time: 4097-3532 ?PT Time Calculation (min) (ACUTE ONLY): 32 min ? ?Charges:  $Therapeutic Activity: 8-22 mins          ?          ? ?Roney Marion, PT  ?Acute Rehabilitation Services ?Office [phone] ?' ? ? ?[place] ?09/25/2021, 12:18 PM ? ?

## 2021-09-25 NOTE — Progress Notes (Signed)
PMR Admission Coordinator Pre-Admission Assessment ? ?Patient: Jessica Mercado is an 61 y.o., female ?MRN: [id number] ?DOB: 03/29/1961 ?Height:   ?Weight:   ? ?Insurance Information ?HMO:     PPO:      PCP:      IPA:      80/20:      OTHER:  ?PRIMARY: Medicaid of [place]       Policy#: [id number]      Subscriber:  ?CM Name:       Phone#:      Fax#:  ?Pre-Cert#:       Employer:  ?Benefits:  Phone #:      Name:  ?Eff. Date:   verified eligibility via passport one source 09/25/21,  [facility]   Deduct:       Out of Pocket Max:       Life Max:  ?CIR: 100%      SNF:  ?Outpatient:      Co-Pay:  ?Home Health:       Co-Pay:  ?DME:      Co-Pay:  ?Providers:  ?SECONDARY:       Policy#:      Phone#:  ? ?Financial Counselor:       Phone#:  ? ?The ?Data Collection Information Summary? for patients in Inpatient Rehabilitation Facilities with attached ?Privacy Act [place] Records? was provided and verbally reviewed with: Patient ? ?Emergency Contact Information ?Contact Information   ? ? Name Relation Home Work Mobile  ? Santos,Bill    [phone]  ? ?  ? ? ?Current Medical History  ?Patient Admitting Diagnosis: Ankle fracture, End Stage Renal Disease  ?History of Present Illness: Jessica Mercado is a 61 year old female with history of T2DM with retinopathy, bipolar disorder, HTN with nephropathy , 2-3 months of recurrent falls who was admitted on 09/17/21 after fall with left ankle dislocation noted by family, N/V, fatigue, tremors and increase in confusion. She was noted to be uremic and found to have oblique displaced trimalleolar ankle fracture with subluxation which was splinted. Nephrology consulted felt that patient has progressed to ESRD with falls due to uremia and patient agreeable to start HD. On 09/17/21, she underwent ORIF right ankle by Dr. Marcelino Scot and [facility] placed by Dr. Donzetta Matters.  Post op to be strict NWB on LLE X 8 weeks, splint X 2 weeks with incisional Prevena and Eliquis for 30 days for DVT prophylaxis at  discharge. CIR was consulted to assist return to PLOF.  ?  ? ?Patient's medical record from [facility] has been reviewed by the rehabilitation admission coordinator and physician. ? ?Past Medical History  ?Past Medical History:  ?Diagnosis Date  ? 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 AKI (acute kidney injury) ([place]) 03/30/2020  ? Cataract   ? Mixed OU  ? Closed trimalleolar fracture of ankle, left, initial encounter 09/16/2021  ? Diabetes mellitus without complication ([place])   ? Diabetic retinopathy ([place])   ? NPDR OU  ? Elevated d-dimer 07/20/2021  ? Hyperlipemia   ? Hyperlipidemia 07/18/2021  ? Hypertension   ? Hypertensive retinopathy   ? OU  ? Hypertensive urgency 07/20/2021  ? Pneumonia 03/29/2020  ? Tendinopathy of left rotator cuff   ? ? ?Has the patient had major surgery during 100 days prior to admission? Yes ? ?Family History   ?family history includes Diabetes in her father, mother, and sister. ? ?Current Medications ?No current facility-administered medications for this encounter. ? ?Current Outpatient  Medications:  ?  acetaminophen (TYLENOL) 500 MG tablet, Take 1 tablet (500 mg total) by mouth every 12 (twelve) hours., Disp: 30 tablet, Rfl: 0 ?  apixaban (ELIQUIS) 2.5 MG TABS tablet, Take 1 tablet (2.5 mg total) by mouth 2 (two) times daily., Disp: 60 tablet, Rfl: 0 ?  gabapentin (NEURONTIN) 100 MG capsule, Take 1 capsule (100 mg total) by mouth daily., Disp: 30 capsule, Rfl: 0 ?  multivitamin (RENA-VIT) TABS tablet, Take 1 tablet by mouth at bedtime., Disp: 30 tablet, Rfl: 0 ? ?Facility-Administered Medications Ordered in Other Encounters:  ?  0.9 %  sodium chloride infusion, 100 mL, Intravenous, PRN, Angelia Mould, MD ?  acetaminophen (TYLENOL) tablet 500 mg, 500 mg, Oral, Q12H, Angelia Mould, MD, 500 mg at 09/25/21 6222 ?  atorvastatin (LIPITOR) tablet 40 mg, 40 mg, Oral, Daily, Angelia Mould, MD, 40 mg at 09/25/21 9798 ?  benztropine  (COGENTIN) tablet 1 mg, 1 mg, Oral, QPM, Angelia Mould, MD, 1 mg at 09/24/21 1709 ?  Chlorhexidine Gluconate Cloth 2 % PADS 6 each, 6 each, Topical, Q0600, Angelia Mould, MD, 6 each at 09/25/21 [phone] ?  Darbepoetin Alfa (ARANESP) injection 40 mcg, 40 mcg, Intravenous, Q Mon-HD, Angelia Mould, MD, 40 mcg at 09/23/21 1015 ?  ferric gluconate (FERRLECIT) 125 mg in sodium chloride 0.9 % 100 mL IVPB, 125 mg, Intravenous, Q M,W,F-HD, Angelia Mould, MD, Stopped at 09/23/21 1638 ?  FLUoxetine (PROZAC) capsule 60 mg, 60 mg, Oral, q morning, Angelia Mould, MD, 60 mg at 09/25/21 9417 ?  gabapentin (NEURONTIN) capsule 100 mg, 100 mg, Oral, Daily, Angelia Mould, MD, 100 mg at 09/25/21 4081 ?  heparin injection 5,000 Units, 5,000 Units, Subcutaneous, Q8H, Angelia Mould, MD, 5,000 Units at 09/25/21 4481 ?  insulin aspart (novoLOG) injection 0-6 Units, 0-6 Units, Subcutaneous, TID WC, Angelia Mould, MD, 1 Units at 09/25/21 1159 ?  lurasidone (LATUDA) tablet 40 mg, 40 mg, Oral, QHS, Angelia Mould, MD, 40 mg at 09/24/21 2158 ?  metoprolol succinate (TOPROL-XL) 24 hr tablet 50 mg, 50 mg, Oral, Daily, Angelia Mould, MD, 50 mg at 09/25/21 8563 ?  multivitamin (RENA-VIT) tablet 1 tablet, 1 tablet, Oral, QHS, Angelia Mould, MD, 1 tablet at 09/24/21 2158 ?  oxyCODONE-acetaminophen (PERCOCET/ROXICET) 5-325 MG per tablet 1-2 tablet, 1-2 tablet, Oral, Q4H PRN, Angelia Mould, MD ?  prazosin (MINIPRESS) capsule 1 mg, 1 mg, Oral, QPM, Angelia Mould, MD, 1 mg at 09/24/21 1709 ?  traZODone (DESYREL) tablet 300 mg, 300 mg, Oral, QHS, Angelia Mould, MD, 300 mg at 09/24/21 2321 ? ?Patients Current Diet:  ?Diet Order   ? ? None  ? ?  ? ? ?Precautions / Restrictions ?   ? ?Has the patient had 2 or more falls or a fall with injury in the past year? Yes ? ?Prior Activity Level ?  ? ?Prior Functional Level ?Self Care: Did the  patient need help bathing, dressing, using the toilet or eating? Independent ? ?Indoor Mobility: Did the patient need assistance with walking from room to room (with or without device)? Independent ? ?Stairs: Did the patient need assistance with internal or external stairs (with or without device)? Needed some help ? ?Functional Cognition: Did the patient need help planning regular tasks such as shopping or remembering to take medications? Needed some help ? ?Patient Information ?  ? ?Patient's Response To:  ?  ? ?Home Assistive Devices / Equipment ?  ? ?  Prior Device Use: Indicate devices/aids used by the patient prior to current illness, exacerbation or injury? None of the above ? ?Current Functional Level ?Cognition ?   ?   ?Extremity Assessment ?(includes Sensation/Coordination) ?    ?   ?  ?ADLs ?    ?  ?Mobility ?    ?  ?Transfers ?    ?  ?Ambulation / Gait / Stairs / Wheelchair Mobility ?    ?  ?Posture / Balance    ?  ?Special needs/care consideration Dialysis: Hemodialysis Monday, Wednesday, and Friday and Special service needs NWB on LLE x 8 wks  ? ?Previous Home Environment (from acute therapy documentation) ?  ? ?Discharge Living Setting ?  ? ?Social/Family/Support Systems ?  ? ?Goals ?  ? ?Decrease burden of Care through IP rehab admission: Specialzed equipment needs, Decrease number of caregivers, and Patient/family education ? ?Possible need for SNF placement upon discharge: not anticipated  ? ?Patient Condition: I have reviewed medical records from [facility] , spoken with CM, and patient and family member. I met with patient at the bedside for inpatient rehabilitation assessment.  Patient will benefit from ongoing PT and OT, can actively participate in 3 hours of therapy a day 5 days of the week, and can make measurable gains during the admission.  Patient will also benefit from the coordinated team approach during an Inpatient Acute Rehabilitation admission.  The patient will  receive intensive therapy as well as Rehabilitation physician, nursing, social worker, and care management interventions.  Due to safety, skin/wound care, disease management, medication administration, pain management,

## 2021-09-25 NOTE — Progress Notes (Signed)
Patient ID: Jessica Mercado, female   DOB: [birth year], 61 y.o.   MRN: [id number] ? ?[facility] ?Progress Note  ? ?Assessment/ Plan:   ?1. Falls/syncope: Suspected to be associated with worsening renal function/uremia and plans noted for admission to home later today with home health services along with durable medical equipment that have already been delivered. ?2. 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  On schedule for hemodialysis today which she will continue on a Monday/Wednesday/Friday schedule at [facility] upon discharge.  The plan is to undertake hemodialysis via her replaced dialysis catheter in a recliner today. ?3. Anemia: Likely secondary to anemia of chronic disease, continue ongoing management with ESA.  Significant iron deficiency noted, will redose intravenous iron. 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 ?5. Nutrition: Continue renal diet with fluid restriction and ongoing oral nutritional supplements. ?6. Hypertension: Blood pressures continue to improve with hemodialysis, will continue to probe estimated dry weight. ? ?Subjective:   ?Underwent exchange of tunneled hemodialysis catheter yesterday by Dr. Scot Dock because of poor functioning noted on Monday.  Plans for possible discharge home today.   ? ?Objective:   ?BP (!) 168/86 (BP Location: Left Arm)   Pulse 68   Temp 98.4 ?F (36.9 ?C) (Oral)   Resp 18   Ht 5\' 7"  (1.702 m)   Wt 90.8 kg   LMP  (LMP Unknown)   SpO2 96%   BMI 31.35 kg/m?  ? ?Physical Exam: ?Gen: Comfortably resting in bed ?CVS: Pulse regular rhythm, normal rate, S1 and S2 normal ?Resp: Clear to auscultation bilaterally, no rales/rhonchi.  Right IJ TDC ?Abd: Soft, flat, nontender, bowel sounds normal ?Ext: Trace right lower extremity edema palpable, left forefoot with wound  vac ? ?Labs: ?BMET ?Recent Labs  ?Lab 09/19/21 ?0203 09/20/21 ?6269 09/23/21 ?4854 09/24/21 ?1217  ?NA 137 134* 134* 134*  ?K 3.8 3.7 3.4* 3.9  ?CL 99 99 99 99  ?CO2 27 28 25   --   ?GLUCOSE 91 120* 137* 168*  ?BUN 38* 44* 39* 21*  ?CREATININE 4.13* 4.93* 4.92* 4.10*  ?CALCIUM 8.2* 8.3* 8.5*  --   ?PHOS 2.5 2.9 4.1  --   ? ?CBC ?Recent Labs  ?Lab 09/19/21 ?0203 09/20/21 ?6270 09/23/21 ?3500 09/24/21 ?1217 09/25/21 ?9381  ?WBC 12.0* 10.9* 12.4*  --  12.4*  ?HGB 7.8* 8.5* 8.5* 8.8* 9.1*  ?HCT 24.5* 25.7* 26.1* 26.0* 27.3*  ?MCV 90.1 89.9 89.1  --  89.8  ?PLT 250 254 258  --  228  ? ? ?  ?Medications:   ? ? acetaminophen  500 mg Oral Q12H  ? atorvastatin  40 mg Oral Daily  ? benztropine  1 mg Oral QPM  ? Chlorhexidine Gluconate Cloth  6 each Topical Q0600  ? darbepoetin (ARANESP) injection - DIALYSIS  40 mcg Intravenous Q Mon-HD  ? FLUoxetine  60 mg Oral q morning  ? gabapentin  100 mg Oral Daily  ? heparin injection (subcutaneous)  5,000 Units Subcutaneous Q8H  ? insulin aspart  0-6 Units Subcutaneous TID WC  ? lurasidone  40 mg Oral QHS  ? metoprolol succinate  50 mg Oral Daily  ? multivitamin  1 tablet Oral QHS  ? prazosin  1 mg Oral QPM  ? traZODone  300 mg Oral QHS  ? ?Elmarie Shiley, MD ?09/25/2021, 9:42 AM  ? ?

## 2021-09-25 NOTE — H&P (Shared)
? ? ?Physical Medicine and Rehabilitation Admission H&P ? ?  ?CC: Functional decline.  ? ? ?HPI: Jessica Mercado is a 61 year old female with history of T2DM with retinopathy, bipolar disorder, HTN with nephropathy, right ankle fracture, 2-3 months of recurrent falls who was admitted on 09/17/21 after fall with left ankle dislocation noted by family, N/V, fatigue, tremors and increase in confusion. She was noted to be uremic and found to have oblique displaced left trimalleolar ankle fracture with subluxation which was splinted. Nephrology consulted felt that patient has progressed to ESRD with falls due to uremia and patient agreeable to start HD. On 09/17/21, she underwent ORIF right ankle by Dr. Marcelino Scot and [facility] placed by Dr. Donzetta Matters.  Post op to be strict NWB on LLE X 8 weeks, splint X 2 weeks with incisional Prevena and Eliquis for 30 days for DVT prophylaxis at discharge. Pain has been controlled with use of tylenol and HD ongoing MWF. She did require exchange of IJ catheter on 05/02 due to poor flow volumes.  Has been CLIP'd for [facility] MWF.  ? ?PT/OT has been working with patient and has noted that she continues to touch down due to difficulty maintaining NWB, difficulty with processing information as well and requires mod assist with ADL/mobility. Patient has declined SNF due to prior hx of trauma and is showing good motivation and some progress. CIR recommended due to functional decline.  ? ? ?Review of Systems  ?Constitutional:  Negative for chills and fever.  ?HENT:  Negative for hearing loss.   ?Eyes:  Positive for blurred vision (left eye with poor vision).  ?Respiratory:  Negative for cough and shortness of breath.   ?Cardiovascular:  Negative for chest pain.  ?Gastrointestinal:  Positive for constipation and heartburn.  ?Musculoskeletal:  Positive for joint pain (chronic left shoulder pain.).  ?Skin:  Negative for rash.  ?Neurological:  Positive for sensory change (right foot is numb) and weakness.  Negative for dizziness and headaches.  ?Psychiatric/Behavioral:  The patient has insomnia.   ? ? ?Past Medical History:  ?Diagnosis Date  ? 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 AKI (acute kidney injury) ([place]) 03/30/2020  ? Cataract   ? Mixed OU  ? Closed trimalleolar fracture of ankle, left, initial encounter 09/16/2021  ? Diabetes mellitus without complication ([place])   ? Diabetic retinopathy ([place])   ? NPDR OU  ? Elevated d-dimer 07/20/2021  ? Hyperlipemia   ? Hyperlipidemia 07/18/2021  ? Hypertension   ? Hypertensive retinopathy   ? OU  ? Hypertensive urgency 07/20/2021  ? Pneumonia 03/29/2020  ? Tendinopathy of left rotator cuff   ? ? ?Past Surgical History:  ?Procedure Laterality Date  ? ANKLE ARTHROSCOPY WITH OPEN REDUCTION INTERNAL FIXATION (ORIF)    ? EXCHANGE OF A DIALYSIS CATHETER N/A 09/24/2021  ? Procedure: EXCHANGE OF A TEMPORARY DIALYSIS CATHETER TO PERMANENT DIALYSIS CATHETER;  Surgeon: Angelia Mould, MD;  Location: [facility] OR;  Service: Vascular;  Laterality: N/A;  ? INSERTION OF DIALYSIS CATHETER N/A 09/17/2021  ? Procedure: INSERTION OF TUNNELED DIALYSIS CATHETER;  Surgeon: Waynetta Sandy, MD;  Location: [facility];  Service: Vascular;  Laterality: N/A;  ? ORIF ANKLE FRACTURE Left 09/17/2021  ? Procedure: OPEN REDUCTION INTERNAL FIXATION (ORIF) ANKLE FRACTURE;  Surgeon: Altamese Englishtown, MD;  Location: [facility];  Service: Orthopedics;  Laterality: Left;  ? ? ?Family History  ?Problem Relation Age of Onset  ? Diabetes Mother   ? Diabetes Father   ?  Diabetes Sister   ? ? ?Social History:  Divorced.  Moved to [place] from [place] in 2019 to live with son-in-law ([employer]--disabled).  Independent with rollator PTA.  She reports that she has never smoked. She has never used smokeless tobacco. She reports that she does not currently use alcohol. She reports that she does not currently use drugs. ? ? ?Allergies  ?Allergen Reactions  ? Vancomycin Other (See Comments)  ?  Red man syndrome   ? ? ?Medications Prior to Admission  ?Medication Sig Dispense Refill  ? atorvastatin (LIPITOR) 40 MG tablet Take 40 mg by mouth daily.  3  ? benztropine (COGENTIN) 1 MG tablet Take 1 mg by mouth every evening.    ? CALCIUM PO Take 1 capsule by mouth daily.    ? FLUoxetine (PROZAC) 20 MG capsule Take 60 mg by mouth every morning.    ? gabapentin (NEURONTIN) 800 MG tablet Take 400 mg by mouth 2 (two) times daily.  3  ? glipiZIDE (GLUCOTROL) 10 MG tablet Take 5 mg by mouth 2 (two) times daily.    ? HUMALOG MIX 75/25 KWIKPEN (75-25) 100 UNIT/ML Kwikpen Inject 20 Units into the skin 2 (two) times daily as needed (High Blood Sugar).  3  ? LATUDA 40 MG TABS tablet Take 40 mg by mouth at bedtime.    ? metoprolol succinate (TOPROL-XL) 50 MG 24 hr tablet Take 50 mg by mouth daily.  3  ? Multiple Vitamin (MULTIVITAMIN WITH MINERALS) TABS tablet Take 1 tablet by mouth daily.    ? prazosin (MINIPRESS) 1 MG capsule Take 1 mg by mouth every evening.  3  ? Semaglutide, 1 MG/DOSE, 4 MG/3ML SOPN Inject 1.3 mg into the skin every Monday.    ? torsemide (DEMADEX) 20 MG tablet Take 40 mg by mouth daily.    ? traZODone (DESYREL) 100 MG tablet Take 300 mg by mouth at bedtime.   3  ? Vitamin D, Ergocalciferol, (DRISDOL) 50000 units CAPS capsule Take 50,000 Units by mouth every Friday.  0  ? hydrOXYzine (ATARAX/VISTARIL) 25 MG tablet Take 75 mg by mouth daily.  (Patient not taking: Reported on 09/16/2021)  0  ? ? ?Home: ?Home Living ?Family/patient expects to be discharged to:: Private residence ?Living Arrangements: Other relatives (Son in Sports coach) ?Available Help at Discharge: Family ?Type of Home: House ?Home Access: Level entry ?Home Layout: One level ?Bathroom Shower/Tub: Tub/shower unit ?Bathroom Toilet: Standard ?Bathroom Accessibility: Yes ?Home Equipment: Kasandra Knudsen - single point, Shower seat, Rollator (4 wheels) ? Lives With: Other (Comment) ?  ?Functional History: ?Prior Function ?Prior Level of Function : Needs assist ?Mobility Comments:  walks with rollatoror cane ?ADLs Comments: assist for bathing, putting on socks and shoes ? ?Functional Status:  ?Mobility: ?Bed Mobility ?Overal bed mobility: Needs Assistance ?Bed Mobility: Supine to Sit ?Supine to sit: Min guard ?Sit to supine: Min assist ?General bed mobility comments: Close guard for LLE and NPWT line; Able to move LLE well; pulls to semi-longsit with rail without assist, and scoots to EOB without physical assist ?Transfers ?Overall transfer level: Needs assistance ?Equipment used: Rolling walker (2 wheels) ?Transfers: Bed to chair/wheelchair/BSC ?Sit to Stand: Max assist ?Bed to/from chair/wheelchair/BSC transfer type:: Lateral/scoot transfer ? Lateral/Scoot Transfers: Mod assist (Light mod assist, mostly for monitoring for NWB LLE) ?General transfer comment: Initiated transfer training with getting to /from [facility], placed directly adjacent and nearly facing bed, to allow for RLE to be on the floor for a pivot point, and to keep LLE  supported on the bed; Lots of cues for technique; pt performed scooting, scoot turns in the bed, and attempted lots of positions/angles ?  ?  ? ?ADL: ?ADL ?Overall ADL's : Needs assistance/impaired ?Eating/Feeding: Modified independent, Bed level ?Grooming: Wash/dry hands, Wash/dry face, Brushing hair, Supervision/safety, Sitting ?Grooming Details (indicate cue type and reason): seated on EOB ?Upper Body Bathing: Sitting, Bed level, Minimal assistance ?Lower Body Bathing: +2 for physical assistance, +2 for safety/equipment, Moderate assistance, Maximal assistance, Sit to/from stand ?Lower Body Bathing Details (indicate cue type and reason): Pt unable to maintain NWB LLE despite maximal multimodal cues during sit to stand activity with PT/OT ?Upper Body Dressing : Set up, Bed level, Sitting ?Lower Body Dressing: Maximal assistance ?Lower Body Dressing Details (indicate cue type and reason): to donn sock on RLE ?Toilet Transfer: Maximal assistance, +2 for physical  assistance, +2 for safety/equipment, BSC/3in1, Rolling walker (2 wheels) ?Toilet Transfer Details (indicate cue type and reason): Pt unable to maintain NWB LLE despite maximal multimodal cues during sit to s

## 2021-09-25 NOTE — Progress Notes (Signed)
INPATIENT REHABILITATION ADMISSION NOTE ? ? ?Arrival Method: chair ? ?   ?Mental Orientation:alert ? ? ?Assessment:done ? ? ?Skin: ?done ? ?IV'S: ?Rt a/c sl ? ?Pain:none ? ? ?Tubes and Drains:prevena wound vac LLE ? ? ?Safety Measures:done ? ? ?Vital Signs:done ? ? ?Height and Weight:done ? ? ?Rehab Orientation:done ? ? ?Family:not in room ? ?Becca Bayne RNC,BSN, WTA  ? ? ? ?Notes:  ?

## 2021-09-25 NOTE — Anesthesia Postprocedure Evaluation (Signed)
Anesthesia Post Note ? ?Patient: Jessica Mercado ? ?Procedure(s) Performed: EXCHANGE OF A TEMPORARY DIALYSIS CATHETER TO PERMANENT DIALYSIS CATHETER (Neck) ? ?  ? ?Patient location during evaluation: PACU ?Anesthesia Type: General ?Level of consciousness: awake and alert ?Pain management: pain level controlled ?Vital Signs Assessment: post-procedure vital signs reviewed and stable ?Respiratory status: spontaneous breathing, nonlabored ventilation, respiratory function stable and patient connected to nasal cannula oxygen ?Cardiovascular status: blood pressure returned to baseline and stable ?Postop Assessment: no apparent nausea or vomiting ?Anesthetic complications: no ? ? ?No notable events documented. ? ?Last Vitals:  ?Vitals:  ? 09/24/21 2046 09/25/21 0536  ?BP: 138/64 (!) 168/86  ?Pulse: 73 68  ?Resp: 18 18  ?Temp: 36.7 ?C 36.9 ?C  ?SpO2: 94% 96%  ?  ?Last Pain:  ?Vitals:  ? 09/25/21 0536  ?TempSrc: Oral  ?PainSc:   ? ? ?  ?  ?  ?  ?  ?  ? ?[place] ? ? ? ? ?

## 2021-09-25 NOTE — Progress Notes (Signed)
FPTS Brief Progress Note ? ?S: sleeping soundly ? ? ?O: ?BP 138/64 (BP Location: Left Arm)   Pulse 73   Temp 98 ?F (36.7 ?C) (Oral)   Resp 18   Ht 5\' 7"  (1.702 m)   Wt 90.8 kg   LMP  (LMP Unknown)   SpO2 94%   BMI 31.35 kg/m?   ? ? ?A/P: ?- Orders reviewed. Labs for AM ordered, which was adjusted as needed.  ?- Patient received dexamethasone 10 mg x1 today, not documented in notes. Consequently CBG slightly elevated at 275. Will continue to monitor, expect this to downtrend appropriately without more steroids. No other steroids are ordered at present. Continue to monitor.  ? ?Gladys Damme, MD ?09/25/2021, 1:21 AM ?PGY-3, [facility] Night Resident  ?Please page [phone] with questions.  ? ? ?

## 2021-09-25 NOTE — Evaluation (Signed)
Occupational Therapy Evaluation ?Patient Details ?Name: Jessica Mercado ?MRN: [id number] ?DOB: 1961/02/19 ?Today's Date: 09/25/2021 ? ? ?History of Present Illness 61 y/o female presented to ED on 09/16/21 after fall at home on 4/23. Sustained a trimalleolar fx of L ankle. S/p ORIF of L ankle on 4/25. PMH: ESRD, HTN, T2DM, heart failure, bipolar disorder  ? ?Clinical Impression ?  ?Pt admitted as above presenting with problems as listed below (refer to OT problem list). Pt seen for OT assessment as MD staff has indicated that pt was medically ready for d/c home after HD later today. Pt was seen for OT assessment, self care activities followed by functional mobility and transfer training in conjunction with PT today. Pt is currently unable to maintainin NWB LLE during transfers, she attempted to perform anterior/posterior from EOB to 3:1 and then HD recliner given increased time and multimodal cues, but was more successful performing lateral scoot to drop arm HD recliner. Pt was educated in role of OT as well as need to be as independent as possible prior to d/c home with PRN assist from her son-in-law whom lives with her. Pt had previously been declining d/c to SNF rehab but is now agreeable to therapy at AIR level after education today and discussion that she is likely to be Mod I functional transfers and intermittent PRN assist with increased participation in therapy/training at AIR level prior to anticipated d/c home. Will follow acutely for OT. ?   ? ?Recommendations for follow up therapy are one component of a multi-disciplinary discharge planning process, led by the attending physician.  Recommendations may be updated based on patient status, additional functional criteria and insurance authorization.  ? ?Follow Up Recommendations ? Acute inpatient rehab (3hours/day)  ?  ?Assistance Recommended at Discharge Frequent or constant Supervision/Assistance  ?Patient can return home with the following Two people to help  with walking and/or transfers;A lot of help with bathing/dressing/bathroom;Assistance with cooking/housework;Help with stairs or ramp for entrance;Assist for transportation ? ?  ?Functional Status Assessment ? Patient has had a recent decline in their functional status and demonstrates the ability to make significant improvements in function in a reasonable and predictable amount of time.  ?Equipment Recommendations ? Other (comment) (Drop arm 3:1 - defer to next venue)  ?  ?Recommendations for Other Services   ? ? ?  ?Precautions / Restrictions Precautions ?Precautions: Fall ?Required Braces or Orthoses: Splint/Cast ?Splint/Cast - Date Prophylactic Dressing Applied (if applicable): 75/10/25 ?Restrictions ?RLE Weight Bearing: Weight bearing as tolerated ?LLE Weight Bearing: Non weight bearing  ? ?  ? ?Mobility Bed Mobility ?Overal bed mobility: Needs Assistance ?Bed Mobility: Supine to Sit ?  ?  ?Supine to sit: Min guard ?Sit to supine: Min assist ?  ?General bed mobility comments: Close guard for LLE and NPWT line; Able to move LLE well; pulls to semi-longsit with rail without assist, and scoots to EOB without physical assist ?  ? ?Transfers ?Overall transfer level: Needs assistance ?  ?Transfers: Bed to chair/wheelchair/BSC ?  ? Lateral/Scoot Transfers: Mod assist (Light mod A mostly for monitoring LLE NWB status) ?General transfer comment: Initiated transfer training with getting to /from BSC, placed directly adjacent and nearly facing bed, to allow for RLE to be on the floor for a pivot point, and to keep LLE supported on the bed; Lots of cues for technique; pt performed scooting, scoot turns in the bed, and attempted lots of positions/angles ?  ? ?  ?Balance Overall balance assessment: Needs assistance ?Sitting-balance  support: No upper extremity supported, Single extremity supported ?Sitting balance-Leahy Scale: Fair ?Sitting balance - Comments: Requires consistent vc's/tc's for NWB LLE ?   ? ?ADL either  performed or assessed with clinical judgement  ? ?ADL Overall ADL's : Needs assistance/impaired ?Eating/Feeding: Modified independent;Bed level ?  ?Grooming: Supervision/safety;Set up;Sitting ?  ?Upper Body Bathing: Sitting;Bed level;Minimal assistance ?  ?Lower Body Bathing: Sitting/lateral leans;Bed level;Minimal assistance ?Lower Body Bathing Details (indicate cue type and reason): Pt unable to maintain NWB LLE despite maximal multimodal cues ?Upper Body Dressing : Set up;Bed level;Sitting ?  ?Lower Body Dressing: Supervision/safety;Bed level ?Lower Body Dressing Details (indicate cue type and reason): to donn sock on RLE ?Toilet Transfer: +2 for physical assistance;+2 for safety/equipment;BSC/3in1;Rolling walker (2 wheels);Anterior/posterior;Moderate assistance ?Toilet Transfer Details (indicate cue type and reason): Pt unable to maintain NWB LLE despite maximal multimodal cues. Simulated toilet transfer using anterior/posterior approach from bed to drop arm HD chair/recliner. Attempted transfer to 3:1 but pt unable to follow NWB LLE precautions. ?Toileting- Clothing Manipulation and Hygiene: Bed level;Sitting/lateral lean;Moderate assistance;Maximal assistance ?Toileting - Clothing Manipulation Details (indicate cue type and reason): Pt unable to maintain NWB LLE despite maximal multimodal cues ?  ?  ?Functional mobility during ADLs:  (+2 Mod A for anterior/posterior transfers from EOB to chair, maximal multimodal cues.) ?General ADL Comments: Pt seen for OT assessment as MD staff has indicated that pt was medically ready for d/c home after HD later today. Pt was seen for OT assessment, self care activities followed by functional mobility and transfer training in conjunction with PT today. Pt is currently unable to maintainin NWB LLE during transfers, she attempted to perform anterior/posterior from EOB to 3:1 and then HD recliner given increased time and multimodal cues, but was more successful performing  lateral scoot to drop arm HD recliner. Pt was educated in role of OT as well as need to be as independent as possible prior to d/c home with PRN assist from her son-in-law whom lives with her. Pt had previously been declining d/c to SNF rehab but is now agreeable to therapy at AIR level after education today and discussion that she is likely to be Mod I functional transfers and intermittent PRN assist with increased participation in therapy/training at AIR level prior to anticipated d/c home. Will follow acutely for OT.  ? ? ? ?Vision Patient Visual Report: No change from baseline ?Vision Assessment?: No apparent visual deficits  ?   ?   ?   ? ?Pertinent Vitals/Pain Pain Assessment ?Pain Assessment: Faces ?Faces Pain Scale: Hurts a little bit ?Pain Location: L ankle ?Pain Descriptors / Indicators: Grimacing ?Pain Intervention(s): Monitored during session, Repositioned  ? ? ? ?Hand Dominance Right ?  ?Extremity/Trunk Assessment Upper Extremity Assessment ?Upper Extremity Assessment: Overall WFL for tasks assessed ?  ?Lower Extremity Assessment ?Lower Extremity Assessment: Defer to PT evaluation ?  ?Cervical / Trunk Assessment ?Cervical / Trunk Assessment: Normal ?  ?Communication Communication ?Communication: No difficulties ?  ?Cognition Arousal/Alertness: Awake/alert ?Behavior During Therapy: [facility] for tasks assessed/performed, Restless ?Overall Cognitive Status: No family/caregiver present to determine baseline cognitive functioning ?Area of Impairment: Problem solving ?  ?Problem Solving: Difficulty sequencing, Requires verbal cues, Requires tactile cues ?General Comments: Able to verbalize NWB restriction of LLE, however tends to touchdown and shift weight onto L foot during transfers; referred to her previous experience at post-acute often during session -- her movement patterns seem to harken back to previous rehab experience when she was NWB RLE ?  ?  ?  General Comments  Pt is very focused on getting home. She  is now agreeable to transfer to AIR to wrok on getting home. ? ?  ?   ?   ? ? ?Home Living Family/patient expects to be discharged to:: Private residence ?Living Arrangements: Other relatives (Son in Sports coach lives wi

## 2021-09-25 NOTE — PMR Pre-admission (Signed)
PMR Admission Coordinator Pre-Admission Assessment ? ?Patient: Jessica Mercado is an 61 y.o., female ?MRN: [id number] ?DOB: [birth year] ?Height: 5\' 7"  (170.2 cm) ?Weight: 90.8 kg ? ?Insurance Information ?HMO:     PPO:      PCP:      IPA:      80/20:      OTHER:  ?PRIMARY: Medicaid [place]       Policy#: [id number]      Subscriber:  ?CM Name:       Phone#:      Fax#:  ?Pre-Cert#:       Employer:  ?Benefits:  Phone #:      Name:  ?Eff. Date:   verified eligibility via passport one source 09/25/21,  [facility]   Deduct:       Out of Pocket Max:       Life Max:  ?CIR: 100%      SNF:  ?Outpatient:      Co-Pay:  ?Home Health:       Co-Pay:  ?DME:      Co-Pay:  ?Providers:  ?SECONDARY:       Policy#:      Phone#:  ? ?Financial Counselor:       Phone#:  ? ?The ?Data Collection Information Summary? for patients in Inpatient Rehabilitation Facilities with attached ?Privacy Act [place] Records? was provided and verbally reviewed with: Patient ? ?Emergency Contact Information ?Contact Information   ? ? Name Relation Home Work Mobile  ? Jessica Mercado    [phone]  ? ?  ? ? ?Current Medical History  ?Patient Admitting Diagnosis: Ankle fracture, End Stage Renal Disease  ?History of Present Illness: Jessica Mercado is a 61 year old female with history of T2DM with retinopathy, bipolar disorder, HTN with nephropathy , 2-3 months of recurrent falls who was admitted on 09/17/21 after fall with left ankle dislocation noted by family, N/V, fatigue, tremors and increase in confusion. She was noted to be uremic and found to have oblique displaced trimalleolar ankle fracture with subluxation which was splinted. Nephrology consulted felt that patient has progressed to ESRD with falls due to uremia and patient agreeable to start HD. On 09/17/21, she underwent ORIF right ankle by Dr. Marcelino Scot and [facility] placed by Dr. Donzetta Matters.  Post op to be strict NWB on LLE X 8 weeks, splint X 2 weeks with incisional Prevena and Eliquis for 30 days for DVT  prophylaxis at discharge. CIR was consulted to assist return to PLOF.  ?  ? ?Patient's medical record from [facility] has been reviewed by the rehabilitation admission coordinator and physician. ? ?Past Medical History  ?Past Medical History:  ?Diagnosis Date  ? 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 AKI (acute kidney injury) ([place]) 03/30/2020  ? Cataract   ? Mixed OU  ? Closed trimalleolar fracture of ankle, left, initial encounter 09/16/2021  ? Diabetes mellitus without complication ([place])   ? Diabetic retinopathy ([place])   ? NPDR OU  ? Elevated d-dimer 07/20/2021  ? Hyperlipemia   ? Hyperlipidemia 07/18/2021  ? Hypertension   ? Hypertensive retinopathy   ? OU  ? Hypertensive urgency 07/20/2021  ? Pneumonia 03/29/2020  ? ? ?Has the patient had major surgery during 100 days prior to admission? Yes ? ?Family History   ?family history includes Diabetes in her father, mother, and sister. ? ?Current Medications ? ?Current Facility-Administered Medications:  ?  0.9 %  sodium chloride infusion, 100 mL, Intravenous,  PRN, Angelia Mould, MD ?  acetaminophen (TYLENOL) tablet 500 mg, 500 mg, Oral, Q12H, Angelia Mould, MD, 500 mg at 09/25/21 7824 ?  atorvastatin (LIPITOR) tablet 40 mg, 40 mg, Oral, Daily, Angelia Mould, MD, 40 mg at 09/25/21 2353 ?  benztropine (COGENTIN) tablet 1 mg, 1 mg, Oral, QPM, Angelia Mould, MD, 1 mg at 09/24/21 1709 ?  Chlorhexidine Gluconate Cloth 2 % PADS 6 each, 6 each, Topical, Q0600, Angelia Mould, MD, 6 each at 09/25/21 [phone] ?  Darbepoetin Alfa (ARANESP) injection 40 mcg, 40 mcg, Intravenous, Q Mon-HD, Angelia Mould, MD, 40 mcg at 09/23/21 1015 ?  ferric gluconate (FERRLECIT) 125 mg in sodium chloride 0.9 % 100 mL IVPB, 125 mg, Intravenous, Q M,W,F-HD, Angelia Mould, MD, Stopped at 09/23/21 1638 ?  FLUoxetine (PROZAC) capsule 60 mg, 60 mg, Oral, q morning, Angelia Mould, MD, 60 mg at 09/25/21  3154 ?  gabapentin (NEURONTIN) capsule 100 mg, 100 mg, Oral, Daily, Angelia Mould, MD, 100 mg at 09/25/21 0086 ?  heparin injection 5,000 Units, 5,000 Units, Subcutaneous, Q8H, Angelia Mould, MD, 5,000 Units at 09/25/21 7619 ?  insulin aspart (novoLOG) injection 0-6 Units, 0-6 Units, Subcutaneous, TID WC, Angelia Mould, MD, 1 Units at 09/25/21 5093 ?  lurasidone (LATUDA) tablet 40 mg, 40 mg, Oral, QHS, Angelia Mould, MD, 40 mg at 09/24/21 2158 ?  metoprolol succinate (TOPROL-XL) 24 hr tablet 50 mg, 50 mg, Oral, Daily, Angelia Mould, MD, 50 mg at 09/25/21 2671 ?  multivitamin (RENA-VIT) tablet 1 tablet, 1 tablet, Oral, QHS, Angelia Mould, MD, 1 tablet at 09/24/21 2158 ?  oxyCODONE-acetaminophen (PERCOCET/ROXICET) 5-325 MG per tablet 1-2 tablet, 1-2 tablet, Oral, Q4H PRN, Angelia Mould, MD ?  prazosin (MINIPRESS) capsule 1 mg, 1 mg, Oral, QPM, Angelia Mould, MD, 1 mg at 09/24/21 1709 ?  traZODone (DESYREL) tablet 300 mg, 300 mg, Oral, QHS, Angelia Mould, MD, 300 mg at 09/24/21 2321 ? ?Patients Current Diet:  ?Diet Order   ? ?       ?  Diet renal/carb modified with fluid restriction Fluid restriction: 1200 mL Fluid; Room service appropriate? Yes; Fluid consistency: Thin  Diet effective now       ?  ? ?  ?  ? ?  ? ? ?Precautions / Restrictions ?Precautions ?Precautions: Fall ?Restrictions ?Weight Bearing Restrictions: Yes ?RLE Weight Bearing: Weight bearing as tolerated ?LLE Weight Bearing: Non weight bearing  ? ?Has the patient had 2 or more falls or a fall with injury in the past year? Yes ? ?Prior Activity Level ?Community (5-7x/wk): Pt. active in the community PTA ? ?Prior Functional Level ?Self Care: Did the patient need help bathing, dressing, using the toilet or eating? Independent ? ?Indoor Mobility: Did the patient need assistance with walking from room to room (with or without device)? Independent ? ?Stairs: Did the patient need  assistance with internal or external stairs (with or without device)? Needed some help ? ?Functional Cognition: Did the patient need help planning regular tasks such as shopping or remembering to take medications? Needed some help ? ?Patient Information ?Are you of Hispanic, Latino/a,or Spanish origin?: A. No, not of Hispanic, Latino/a, or Spanish origin ?What is your race?: A. White ?Do you need or want an interpreter to communicate with a doctor or health care staff?: 0. No ? ?Patient's Response To:  ?Health Literacy and Transportation ?Is the patient able to respond to health literacy and  transportation needs?: Yes ?Health Literacy - How often do you need to have someone help you when you read instructions, pamphlets, or other written material from your doctor or pharmacy?: Never ?In the past 12 months, has lack of transportation kept you from medical appointments or from getting medications?: No ?In the past 12 months, has lack of transportation kept you from meetings, work, or from getting things needed for daily living?: No ? ?Home Assistive Devices / Equipment ?Home Assistive Devices/Equipment: Gilford Rile (specify type) ?Home Equipment: Kasandra Knudsen - single point, Shower seat, Rollator (4 wheels) ? ?Prior Device Use: Indicate devices/aids used by the patient prior to current illness, exacerbation or injury? None of the above ? ?Current Functional Level ?Cognition ? Overall Cognitive Status: No family/caregiver present to determine baseline cognitive functioning ?Orientation Level: Oriented X4 ?General Comments: Able to verbalize NWB restriction of LLE, however tends to touchdown and shift weight onto L foot during transfers; referred to her previous experience at post-acute often during session -- her movement patterns seem to harken back to previous rehab experience when she was NWB RLE ?   ?Extremity Assessment ?(includes Sensation/Coordination) ? Upper Extremity Assessment: Overall WFL for tasks assessed  ?Lower  Extremity Assessment: Defer to PT evaluation ?RLE Deficits / Details: patient reports "my left leg use to compensate for my right so this one is very weak". Hx of ankle fusion ?LLE Deficits / Details: 3+/5 abov

## 2021-09-25 NOTE — Progress Notes (Signed)
Inpatient Rehab Admissions Coordinator:   Per PT request,  patient was screened for CIR candidacy by Thomasa Heidler, MS, CCC-SLP. At this time, Pt. Appears to be a a potential candidate for CIR. I will place   order for rehab consult per protocol for full assessment. Please contact me any with questions.  Christianna Belmonte, MS, CCC-SLP Rehab Admissions Coordinator  [phone] (celll) [phone] (office)  

## 2021-09-25 NOTE — H&P (Signed)
? ? ?Physical Medicine and Rehabilitation Admission H&P ? ?  ?CC: Functional decline.  ? ? ?HPI: Jessica Mercado is a 61 year old female with history of T2DM with retinopathy, bipolar disorder, HTN with nephropathy, right ankle fracture, 2-3 months of recurrent falls who was admitted on 09/17/21 after fall with left ankle dislocation noted by family, N/V, fatigue, tremors and increase in confusion. She was noted to be uremic and found to have oblique displaced left trimalleolar ankle fracture with subluxation which was splinted. Nephrology consulted felt that patient has progressed to ESRD with falls due to uremia and patient agreeable to start HD. On 09/17/21, she underwent ORIF right ankle by Dr. Marcelino Scot and [facility] placed by Dr. Donzetta Matters.  Post op to be strict NWB on LLE X 8 weeks, splint X 2 weeks with incisional Prevena and Eliquis for 30 days for DVT prophylaxis at discharge. Pain has been controlled with use of tylenol and HD ongoing MWF. She did require exchange of IJ catheter on 05/02 due to poor flow volumes.  Has been CLIP'd for [place] MWF.  ? ?PT/OT has been working with patient and has noted that she continues to touch down due to difficulty maintaining NWB, difficulty with processing information as well and requires mod assist with ADL/mobility. Patient has declined SNF due to prior hx of trauma and is showing good motivation and some progress. CIR recommended due to functional decline. Complains of chronic left shoulder pain.  ? ? ?Review of Systems  ?Musculoskeletal:  Positive for joint pain (chronic left shoulder pain.).  ? ? ?Past Medical History:  ?Diagnosis Date  ? 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 AKI (acute kidney injury) ([place]) 03/30/2020  ? Cataract   ? Mixed OU  ? Closed trimalleolar fracture of ankle, left, initial encounter 09/16/2021  ? Diabetes mellitus without complication ([place])   ? Diabetic retinopathy ([place])   ? NPDR OU  ? Elevated d-dimer 07/20/2021  ?  Hyperlipemia   ? Hyperlipidemia 07/18/2021  ? Hypertension   ? Hypertensive retinopathy   ? OU  ? Hypertensive urgency 07/20/2021  ? Pneumonia 03/29/2020  ? Tendinopathy of left rotator cuff   ? ? ?Past Surgical History:  ?Procedure Laterality Date  ? ANKLE ARTHROSCOPY WITH OPEN REDUCTION INTERNAL FIXATION (ORIF)    ? EXCHANGE OF A DIALYSIS CATHETER N/A 09/24/2021  ? Procedure: EXCHANGE OF A TEMPORARY DIALYSIS CATHETER TO PERMANENT DIALYSIS CATHETER;  Surgeon: Angelia Mould, MD;  Location: [facility] OR;  Service: Vascular;  Laterality: N/A;  ? INSERTION OF DIALYSIS CATHETER N/A 09/17/2021  ? Procedure: INSERTION OF TUNNELED DIALYSIS CATHETER;  Surgeon: Waynetta Sandy, MD;  Location: [place];  Service: Vascular;  Laterality: N/A;  ? ORIF ANKLE FRACTURE Left 09/17/2021  ? Procedure: OPEN REDUCTION INTERNAL FIXATION (ORIF) ANKLE FRACTURE;  Surgeon: Altamese [place], MD;  Location: [place];  Service: Orthopedics;  Laterality: Left;  ? ? ?Family History  ?Problem Relation Age of Onset  ? Diabetes Mother   ? Diabetes Father   ? Diabetes Sister   ? ? ?Social History:  Divorced.  Moved to [place] from [place] in 2019. Lives with son-in-law.  Independent with rollator PTA.  reports that she has never smoked. She has never used smokeless tobacco. She reports that she does not currently use alcohol. She reports that she does not currently use drugs. ? ? ?Allergies  ?Allergen Reactions  ? Vancomycin Other (See Comments)  ?  Red man syndrome  ? ? ?  Medications Prior to Admission  ?Medication Sig Dispense Refill  ? acetaminophen (TYLENOL) 500 MG tablet Take 1 tablet (500 mg total) by mouth every 12 (twelve) hours. 30 tablet 0  ? apixaban (ELIQUIS) 2.5 MG TABS tablet Take 1 tablet (2.5 mg total) by mouth 2 (two) times daily. 60 tablet 0  ? atorvastatin (LIPITOR) 40 MG tablet Take 40 mg by mouth daily.  3  ? benztropine (COGENTIN) 1 MG tablet Take 1 mg by mouth every evening.    ? FLUoxetine (PROZAC) 20 MG capsule Take 60 mg by mouth  every morning.    ? gabapentin (NEURONTIN) 100 MG capsule Take 1 capsule (100 mg total) by mouth daily. 30 capsule 0  ? LATUDA 40 MG TABS tablet Take 40 mg by mouth at bedtime.    ? metoprolol succinate (TOPROL-XL) 50 MG 24 hr tablet Take 50 mg by mouth daily.  3  ? multivitamin (RENA-VIT) TABS tablet Take 1 tablet by mouth at bedtime. 30 tablet 0  ? prazosin (MINIPRESS) 1 MG capsule Take 1 mg by mouth every evening.  3  ? Semaglutide, 1 MG/DOSE, 4 MG/3ML SOPN Inject 1.3 mg into the skin every Monday.    ? traZODone (DESYREL) 100 MG tablet Take 300 mg by mouth at bedtime.   3  ? ?Home: ?Home Living ?Family/patient expects to be discharged to:: Private residence ?Living Arrangements: Other relatives (Son in Sports coach) ?Available Help at Discharge: Family ?Type of Home: House ?Home Access: Level entry ?Home Layout: One level ?Bathroom Shower/Tub: Tub/shower unit ?Bathroom Toilet: Standard ?Bathroom Accessibility: Yes ?Home Equipment: Kasandra Knudsen - single point, Shower seat, Rollator (4 wheels) ? Lives With: Other (Comment) ?  ?Functional History: ?Prior Function ?Prior Level of Function : Needs assist ?Mobility Comments: walks with rollatoror cane ?ADLs Comments: assist for bathing, putting on socks and shoes ?  ?Functional Status:  ?Mobility: ?Bed Mobility ?Overal bed mobility: Needs Assistance ?Bed Mobility: Supine to Sit ?Supine to sit: Min guard ?Sit to supine: Min assist ?General bed mobility comments: Close guard for LLE and NPWT line; Able to move LLE well; pulls to semi-longsit with rail without assist, and scoots to EOB without physical assist ?Transfers ?Overall transfer level: Needs assistance ?Equipment used: Rolling walker (2 wheels) ?Transfers: Bed to chair/wheelchair/BSC ?Sit to Stand: Max assist ?Bed to/from chair/wheelchair/BSC transfer type:: Lateral/scoot transfer ? Lateral/Scoot Transfers: Mod assist (Light mod assist, mostly for monitoring for NWB LLE) ?General transfer comment: Initiated transfer training  with getting to /from BSC, placed directly adjacent and nearly facing bed, to allow for RLE to be on the floor for a pivot point, and to keep LLE supported on the bed; Lots of cues for technique; pt performed scooting, scoot turns in the bed, and attempted lots of positions/angles ?  ?ADL: ?ADL ?Overall ADL's : Needs assistance/impaired ?Eating/Feeding: Modified independent, Bed level ?Grooming: Wash/dry hands, Wash/dry face, Brushing hair, Supervision/safety, Sitting ?Grooming Details (indicate cue type and reason): seated on EOB ?Upper Body Bathing: Sitting, Bed level, Minimal assistance ?Lower Body Bathing: +2 for physical assistance, +2 for safety/equipment, Moderate assistance, Maximal assistance, Sit to/from stand ?Lower Body Bathing Details (indicate cue type and reason): Pt unable to maintain NWB LLE despite maximal multimodal cues during sit to stand activity with PT/OT ?Upper Body Dressing : Set up, Bed level, Sitting ?Lower Body Dressing: Maximal assistance ?Lower Body Dressing Details (indicate cue type and reason): to donn sock on RLE ?Toilet Transfer: Maximal assistance, +2 for physical assistance, +2 for safety/equipment, BSC/3in1, Rolling walker (2 wheels) ?  Toilet Transfer Details (indicate cue type and reason): Pt unable to maintain NWB LLE despite maximal multimodal cues during sit to stand activity with PT/OT ?Toileting- Clothing Manipulation and Hygiene: Maximal assistance, +2 for physical assistance, +2 for safety/equipment, Bed level, Sitting/lateral lean, Sit to/from stand ?Functional mobility during ADLs: Maximal assistance, +2 for physical assistance, +2 for safety/equipment, Cueing for safety, Rolling walker (2 wheels) (Pt unable to maintain NWB LLE despite maximal multimodal cues during sit to stand activity with PT/OT assessments) ?General ADL Comments: performed self care tasks seated on EOB ?  ?Cognition: ?Cognition ?Overall Cognitive Status: No family/caregiver present to determine  baseline cognitive functioning ?Orientation Level: Oriented X4 ?Cognition ?Arousal/Alertness: Awake/alert ?Behavior During Therapy: [facility] for tasks assessed/performed, Restless ?Overall Cognitive Status: No family/care

## 2021-09-25 NOTE — Progress Notes (Signed)
[facility] ? ?Subjective  -no complaints ? ? ?Objective ?137/73 ?71 ?98.3 ?F (36.8 ?C) (Oral) ?17 ?93% ? ?Intake/Output Summary (Last 24 hours) at 09/25/2021 1052 ?Last data filed at 09/25/2021 0257 ?Gross per 24 hour  ?Intake 701.83 ml  ?Output 15 ml  ?Net 686.83 ml  ? ? ?Right IJ TDC in place with no obvious bleeding ? ?Laboratory ?Lab Results: ?Recent Labs  ?  09/23/21 ?1483 09/24/21 ?1217 09/25/21 ?0735  ?WBC 12.4*  --  12.4*  ?HGB 8.5* 8.8* 9.1*  ?HCT 26.1* 26.0* 27.3*  ?PLT 258  --  228  ? ?BMET ?Recent Labs  ?  09/23/21 ?0414 09/24/21 ?1217  ?NA 134* 134*  ?K 3.4* 3.9  ?CL 99 99  ?CO2 25  --   ?GLUCOSE 137* 168*  ?BUN 39* 21*  ?CREATININE 4.92* 4.10*  ?CALCIUM 8.5*  --   ? ? ?COAG ?Lab Results  ?Component Value Date  ? INR 1.1 09/16/2021  ? ?No results found for: PTT ? ?Assessment/Planning: ? ?Postop day 1 status post exchange of right IJ [facility] after we were consulted on Monday given the catheter was nonfunctioning.  Okay to use catheter today.  We will arrange follow-up in the office to evaluate for permanent AV fistula access.  Please call vascular if any further questions or concerns. ? ?Marty Heck ?09/25/2021 ?10:52 AM ?-- ? ? ?

## 2021-09-25 NOTE — Progress Notes (Signed)
Pt has been financially cleared to receive out-pt HD at [facility] on MWF. Advised that pt's case is currently being reviewed by CIR for potential admission. Will continue to follow to advise clinic of pt's start date once known.  ? ?Melven Sartorius ?Renal Navigator ?[phone] ? ? ?
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
## 2021-09-25 NOTE — Progress Notes (Signed)
Report called to Marjorie Smolder, RN on receiving unit 4W. ?

## 2021-09-25 NOTE — H&P (Signed)
? ? ?Physical Medicine and Rehabilitation Admission H&P ? ?  ?CC: Functional decline.  ? ? ?HPI: Jessica Mercado is a 61 year old female with history of T2DM with retinopathy, bipolar disorder, HTN with nephropathy, right ankle fracture, 2-3 months of recurrent falls who was admitted on 09/17/21 after fall with left ankle dislocation noted by family, N/V, fatigue, tremors and increase in confusion. She was noted to be uremic and found to have oblique displaced left trimalleolar ankle fracture with subluxation which was splinted. Nephrology consulted felt that patient has progressed to ESRD with falls due to uremia and patient agreeable to start HD. On 09/17/21, she underwent ORIF right ankle by Dr. Marcelino Scot and [facility] placed by Dr. Donzetta Matters.  Post op to be strict NWB on LLE X 8 weeks, splint X 2 weeks with incisional Prevena and Eliquis for 30 days for DVT prophylaxis at discharge. Pain has been controlled with use of tylenol and HD ongoing MWF. She did require exchange of IJ catheter on 05/02 due to poor flow volumes.  Has been CLIP'd for [place] MWF.  ? ?PT/OT has been working with patient and has noted that she continues to touch down due to difficulty maintaining NWB, difficulty with processing information as well and requires mod assist with ADL/mobility. Patient has declined SNF due to prior hx of trauma and is showing good motivation and some progress. CIR recommended due to functional decline. Complains of chronic left shoulder pain.  ? ? ?Review of Systems  ?Musculoskeletal:  Positive for joint pain (chronic left shoulder pain.).  ? ? ?Past Medical History:  ?Diagnosis Date  ? 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 AKI (acute kidney injury) ([place]) 03/30/2020  ? Cataract   ? Mixed OU  ? Closed trimalleolar fracture of ankle, left, initial encounter 09/16/2021  ? Diabetes mellitus without complication ([place])   ? Diabetic retinopathy ([place])   ? NPDR OU  ? Elevated d-dimer 07/20/2021  ?  Hyperlipemia   ? Hyperlipidemia 07/18/2021  ? Hypertension   ? Hypertensive retinopathy   ? OU  ? Hypertensive urgency 07/20/2021  ? Pneumonia 03/29/2020  ? Tendinopathy of left rotator cuff   ? ? ?Past Surgical History:  ?Procedure Laterality Date  ? ANKLE ARTHROSCOPY WITH OPEN REDUCTION INTERNAL FIXATION (ORIF)    ? EXCHANGE OF A DIALYSIS CATHETER N/A 09/24/2021  ? Procedure: EXCHANGE OF A TEMPORARY DIALYSIS CATHETER TO PERMANENT DIALYSIS CATHETER;  Surgeon: Angelia Mould, MD;  Location: [facility] OR;  Service: Vascular;  Laterality: N/A;  ? INSERTION OF DIALYSIS CATHETER N/A 09/17/2021  ? Procedure: INSERTION OF TUNNELED DIALYSIS CATHETER;  Surgeon: Waynetta Sandy, MD;  Location: [place];  Service: Vascular;  Laterality: N/A;  ? ORIF ANKLE FRACTURE Left 09/17/2021  ? Procedure: OPEN REDUCTION INTERNAL FIXATION (ORIF) ANKLE FRACTURE;  Surgeon: Altamese Eakly, MD;  Location: [place];  Service: Orthopedics;  Laterality: Left;  ? ? ?Family History  ?Problem Relation Age of Onset  ? Diabetes Mother   ? Diabetes Father   ? Diabetes Sister   ? ? ?Social History:  Divorced.  Moved to [place] from [place] in 2019. Lives with son-in-law.  Independent with rollator PTA.  reports that she has never smoked. She has never used smokeless tobacco. She reports that she does not currently use alcohol. She reports that she does not currently use drugs. ? ? ?Allergies  ?Allergen Reactions  ? Vancomycin Other (See Comments)  ?  Red man syndrome  ? ? ?  Medications Prior to Admission  ?Medication Sig Dispense Refill  ? atorvastatin (LIPITOR) 40 MG tablet Take 40 mg by mouth daily.  3  ? benztropine (COGENTIN) 1 MG tablet Take 1 mg by mouth every evening.    ? CALCIUM PO Take 1 capsule by mouth daily.    ? FLUoxetine (PROZAC) 20 MG capsule Take 60 mg by mouth every morning.    ? gabapentin (NEURONTIN) 800 MG tablet Take 400 mg by mouth 2 (two) times daily.  3  ? glipiZIDE (GLUCOTROL) 10 MG tablet Take 5 mg by mouth 2 (two) times daily.     ? HUMALOG MIX 75/25 KWIKPEN (75-25) 100 UNIT/ML Kwikpen Inject 20 Units into the skin 2 (two) times daily as needed (High Blood Sugar).  3  ? LATUDA 40 MG TABS tablet Take 40 mg by mouth at bedtime.    ? metoprolol succinate (TOPROL-XL) 50 MG 24 hr tablet Take 50 mg by mouth daily.  3  ? Multiple Vitamin (MULTIVITAMIN WITH MINERALS) TABS tablet Take 1 tablet by mouth daily.    ? prazosin (MINIPRESS) 1 MG capsule Take 1 mg by mouth every evening.  3  ? Semaglutide, 1 MG/DOSE, 4 MG/3ML SOPN Inject 1.3 mg into the skin every Monday.    ? torsemide (DEMADEX) 20 MG tablet Take 40 mg by mouth daily.    ? traZODone (DESYREL) 100 MG tablet Take 300 mg by mouth at bedtime.   3  ? Vitamin D, Ergocalciferol, (DRISDOL) 50000 units CAPS capsule Take 50,000 Units by mouth every Friday.  0  ? hydrOXYzine (ATARAX/VISTARIL) 25 MG tablet Take 75 mg by mouth daily.  (Patient not taking: Reported on 09/16/2021)  0  ? ?Home: ?Home Living ?Family/patient expects to be discharged to:: Private residence ?Living Arrangements: Other relatives (Son in Sports coach) ?Available Help at Discharge: Family ?Type of Home: House ?Home Access: Level entry ?Home Layout: One level ?Bathroom Shower/Tub: Tub/shower unit ?Bathroom Toilet: Standard ?Bathroom Accessibility: Yes ?Home Equipment: Kasandra Knudsen - single point, Shower seat, Rollator (4 wheels) ? Lives With: Other (Comment) ?  ?Functional History: ?Prior Function ?Prior Level of Function : Needs assist ?Mobility Comments: walks with rollatoror cane ?ADLs Comments: assist for bathing, putting on socks and shoes ?  ?Functional Status:  ?Mobility: ?Bed Mobility ?Overal bed mobility: Needs Assistance ?Bed Mobility: Supine to Sit ?Supine to sit: Min guard ?Sit to supine: Min assist ?General bed mobility comments: Close guard for LLE and NPWT line; Able to move LLE well; pulls to semi-longsit with rail without assist, and scoots to EOB without physical assist ?Transfers ?Overall transfer level: Needs  assistance ?Equipment used: Rolling walker (2 wheels) ?Transfers: Bed to chair/wheelchair/BSC ?Sit to Stand: Max assist ?Bed to/from chair/wheelchair/BSC transfer type:: Lateral/scoot transfer ? Lateral/Scoot Transfers: Mod assist (Light mod assist, mostly for monitoring for NWB LLE) ?General transfer comment: Initiated transfer training with getting to /from BSC, placed directly adjacent and nearly facing bed, to allow for RLE to be on the floor for a pivot point, and to keep LLE supported on the bed; Lots of cues for technique; pt performed scooting, scoot turns in the bed, and attempted lots of positions/angles ?  ?ADL: ?ADL ?Overall ADL's : Needs assistance/impaired ?Eating/Feeding: Modified independent, Bed level ?Grooming: Wash/dry hands, Wash/dry face, Brushing hair, Supervision/safety, Sitting ?Grooming Details (indicate cue type and reason): seated on EOB ?Upper Body Bathing: Sitting, Bed level, Minimal assistance ?Lower Body Bathing: +2 for physical assistance, +2 for safety/equipment, Moderate assistance, Maximal assistance, Sit to/from stand ?Lower Body  Bathing Details (indicate cue type and reason): Pt unable to maintain NWB LLE despite maximal multimodal cues during sit to stand activity with PT/OT ?Upper Body Dressing : Set up, Bed level, Sitting ?Lower Body Dressing: Maximal assistance ?Lower Body Dressing Details (indicate cue type and reason): to donn sock on RLE ?Toilet Transfer: Maximal assistance, +2 for physical assistance, +2 for safety/equipment, BSC/3in1, Rolling walker (2 wheels) ?Toilet Transfer Details (indicate cue type and reason): Pt unable to maintain NWB LLE despite maximal multimodal cues during sit to stand activity with PT/OT ?Toileting- Clothing Manipulation and Hygiene: Maximal assistance, +2 for physical assistance, +2 for safety/equipment, Bed level, Sitting/lateral lean, Sit to/from stand ?Functional mobility during ADLs: Maximal assistance, +2 for physical assistance, +2 for  safety/equipment, Cueing for safety, Rolling walker (2 wheels) (Pt unable to maintain NWB LLE despite maximal multimodal cues during sit to stand activity with PT/OT assessments) ?General ADL Comments: performed self care task

## 2021-09-25 NOTE — TOC Transition Note (Signed)
Transition of Care (TOC) - CM/SW Discharge Note ? ? ?Patient Details  ?Name: Jessica Mercado ?MRN: [id number] ?Date of Birth: 1960/10/30 ? ?Transition of Care (TOC) CM/SW Contact:  ?Tom-Johnson, Renea Ee, RN ?Phone Number: ?09/25/2021, 2:39 PM ? ? ?Clinical Narrative:    ? ?Patient is scheduled for discharge today to [facility]. Hospital bed and wheelchair has been delivered to patient's home by [employer]. Patient will be transported via bed as it's in-hospital transfer.  ?No further TOC needs noted.   ? ?Final next level of care: [place] ?Barriers to Discharge: Barriers Resolved ? ? ?Patient Goals and CMS Choice ?Patient states their goals for this hospitalization and ongoing recovery are:: To go to [facility] then return home. ?CMS Medicare.gov Compare Post Acute Care list provided to:: Patient ?Choice offered to / list presented to : Patient ? ?Discharge Placement ?  ?           ?  ?Patient to be transferred to facility by: Bed, in-hospital transfer ?  ?  ? ?Discharge Plan and Services ?  ?Discharge Planning Services: CM Consult ?           ?  ?  ?  ?  ?  ?  ?  ?  ?  ?  ? ?Social Determinants of Health (SDOH) Interventions ?  ? ? ?Readmission Risk Interventions ? ?  07/20/2021  ?  4:39 PM  ?Readmission Risk Prevention Plan  ?Transportation Screening Complete  ?[place] or Home Care Consult Complete  ?Social Work Consult for [place] Planning/Counseling Complete  ?Palliative Care Screening Not Applicable  ?Medication Review Press photographer) Complete  ? ? ? ? ? ?

## 2021-09-25 NOTE — Progress Notes (Signed)
removed 199mls net fluid pt complained of dizziness post tx when she put her feet down , pbp dropped to 17B 56P systolic, pt given 2 ice cups with apple juice and a bag lunch.  Bbp improved and pt felt better. System clotte with 12 minutes left in treatment, pt lost approx 156mls of blood.  No heparin given.  tolerated tx well overall   pre bp 138/77 post bp 119/71 pt in chair unable to weigh. gave iv iron as ordered.  catheter dressing changed to right chest.  packed with heparin clamped capped and wrapped ?

## 2021-09-26 DIAGNOSIS — E1110 Type 2 diabetes mellitus with ketoacidosis without coma: Secondary | ICD-10-CM

## 2021-09-26 DIAGNOSIS — F319 Bipolar disorder, unspecified: Secondary | ICD-10-CM | POA: Diagnosis not present

## 2021-09-26 DIAGNOSIS — I10 Essential (primary) hypertension: Secondary | ICD-10-CM

## 2021-09-26 DIAGNOSIS — Z4689 Encounter for fitting and adjustment of other specified devices: Secondary | ICD-10-CM

## 2021-09-26 DIAGNOSIS — S82842S Displaced bimalleolar fracture of left lower leg, sequela: Secondary | ICD-10-CM

## 2021-09-26 DIAGNOSIS — E871 Hypo-osmolality and hyponatremia: Secondary | ICD-10-CM

## 2021-09-26 DIAGNOSIS — G8918 Other acute postprocedural pain: Secondary | ICD-10-CM | POA: Diagnosis not present

## 2021-09-26 LAB — GLUCOSE, CAPILLARY
Glucose-Capillary: 153 mg/dL — ABNORMAL HIGH (ref 70–99)
Glucose-Capillary: 165 mg/dL — ABNORMAL HIGH (ref 70–99)
Glucose-Capillary: 189 mg/dL — ABNORMAL HIGH (ref 70–99)
Glucose-Capillary: 203 mg/dL — ABNORMAL HIGH (ref 70–99)

## 2021-09-26 LAB — RENAL FUNCTION PANEL
Albumin: 2.7 g/dL — ABNORMAL LOW (ref 3.5–5.0)
Anion gap: 10 (ref 5–15)
BUN: 10 mg/dL (ref 6–20)
CO2: 26 mmol/L (ref 22–32)
Calcium: 8.5 mg/dL — ABNORMAL LOW (ref 8.9–10.3)
Chloride: 99 mmol/L (ref 98–111)
Creatinine, Ser: 2.31 mg/dL — ABNORMAL HIGH (ref 0.44–1.00)
GFR, Estimated: 24 mL/min — ABNORMAL LOW (ref >60)
Glucose, Bld: 209 mg/dL — ABNORMAL HIGH (ref 70–99)
Phosphorus: 2.1 mg/dL — ABNORMAL LOW (ref 2.5–4.6)
Potassium: 3.4 mmol/L — ABNORMAL LOW (ref 3.5–5.1)
Sodium: 135 mmol/L (ref 135–145)

## 2021-09-26 MED ORDER — SEMAGLUTIDE (1 MG/DOSE) 4 MG/3ML ~~LOC~~ SOPN
1.3000 mg | PEN_INJECTOR | SUBCUTANEOUS | Status: DC
  Filled 2021-09-26: qty 0.98

## 2021-09-26 MED ORDER — BENEPROTEIN PO POWD
1.0000 | Freq: Three times a day (TID) | ORAL | Status: DC
  Administered 2021-09-26 – 2021-10-07 (×22): 6 g via ORAL
  Filled 2021-09-26: qty 227

## 2021-09-26 MED ORDER — MILK AND MOLASSES ENEMA
1.0000 | Freq: Every day | RECTAL | Status: DC | PRN
  Filled 2021-09-26: qty 240

## 2021-09-26 NOTE — Progress Notes (Signed)
Patient ID: Jessica Mercado, female   DOB: 1960-06-24, 61 y.o.   MRN: [id number] ? ?[facility] ?Progress Note  ? ?Assessment/ Plan:   ?1. Falls/syncope/left trimalleolar ankle fracture: The fall and syncope was likely related to worsening uremia and she is undergoing evaluation by orthopedic surgery for management of her trimalleolar ankle fracture.  Initial plans to discharge home were changed and she has now been admitted to CIR. ?2. 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  I will order for hemodialysis again tomorrow to continue her schedule of MWF (which she will resume upon discharge when she gets hemodialysis at [facility]).. ?3. Anemia: Likely secondary to anemia of chronic disease, continue ongoing management with ESA.  Status post intravenous iron. 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 ?5. Nutrition: Continue renal diet with fluid restriction and ongoing oral nutritional supplements. ?6. Hypertension: Blood pressures continue to improve with hemodialysis, she appears to be quite close to her dry weight as indicated by post dialytic hypotension noted yesterday. ? ?Subjective:   ?Admitted yesterday evening into the [facility] inpatient rehabilitation unit for intensive OT/PT.  Explained that this was a better course of action rather than straight discharge to home due to safety concerns and ability to provide better therapies.  ? ?Objective:   ?BP 126/73 (BP Location: Left Arm)   Pulse 74   Temp 98.7 ?F (37.1 ?C)   Resp 18   Ht 5\' 7"  (1.702 m)   Wt 85.7 kg   LMP  (LMP Unknown)   SpO2 97%   BMI 29.59 kg/m?  ? ?Physical Exam: ?Gen: Comfortably resting in bed ?CVS: Pulse regular rhythm, normal rate, S1 and S2 normal ?Resp: Clear to auscultation bilaterally, no rales/rhonchi.  Right IJ TDC ?Abd: Soft, flat, nontender, bowel  sounds normal ?Ext: Trace right lower extremity edema palpable, left forefoot with wound vac ? ?Labs: ?BMET ?Recent Labs  ?Lab 09/20/21 ?1017 09/23/21 ?5102 09/24/21 ?1217 09/25/21 ?1736  ?NA 134* 134* 134* 135  ?K 3.7 3.4* 3.9 3.4*  ?CL 99 99 99 99  ?CO2 28 25  --  26  ?GLUCOSE 120* 137* 168* 209*  ?BUN 44* 39* 21* 10  ?CREATININE 4.93* 4.92* 4.10* 2.31*  ?CALCIUM 8.3* 8.5*  --  8.5*  ?PHOS 2.9 4.1  --  2.1*  ? ?CBC ?Recent Labs  ?Lab 09/20/21 ?5852 09/23/21 ?7782 09/24/21 ?1217 09/25/21 ?4235 09/25/21 ?1736  ?WBC 10.9* 12.4*  --  12.4* 13.5*  ?HGB 8.5* 8.5* 8.8* 9.1* 9.1*  ?HCT 25.7* 26.1* 26.0* 27.3* 26.9*  ?MCV 89.9 89.1  --  89.8 90.0  ?PLT 254 258  --  228 235  ? ? ?  ?Medications:   ? ? acetaminophen  500 mg Oral Q12H  ? atorvastatin  40 mg Oral Q supper  ? benztropine  1 mg Oral QPM  ? Chlorhexidine Gluconate Cloth  6 each Topical BID  ? [START ON 09/30/2021] darbepoetin (ARANESP) injection - DIALYSIS  40 mcg Intravenous Q Mon-HD  ? FLUoxetine  60 mg Oral q morning  ? gabapentin  100 mg Oral Daily  ? heparin  5,000 Units Subcutaneous Q8H  ? insulin aspart  0-5 Units Subcutaneous QHS  ? insulin aspart  0-6 Units Subcutaneous TID WC  ? lurasidone  40 mg Oral QHS  ? metoprolol succinate  50 mg Oral Daily  ? multivitamin  1  tablet Oral QHS  ? prazosin  1 mg Oral QPM  ? protein supplement  1 Scoop Oral TID WC  ? Semaglutide (1 MG/DOSE)  1.3 mg Subcutaneous Q Mon  ? traZODone  300 mg Oral QHS  ? ?Elmarie Shiley, MD ?09/26/2021, 11:26 AM  ? ?

## 2021-09-26 NOTE — Discharge Instructions (Addendum)
Inpatient Rehab Discharge Instructions  KALEISHA BHARGAVA Discharge date and time:  08/10/21  Activities/Precautions/ Functional Status: Activity: no lifting, driving, or strenuous exercise till cleared by MD. No weigh on left leg Diet: diabetic diet and renal diet--Limit fluids to 1200 cc/day Wound Care:    Functional status:  ___ No restrictions     ___ Walk up steps independently _X__ 24/7 supervision/assistance   ___ Walk up steps with assistance ___ Intermittent supervision/assistance  ___ Bathe/dress independently ___ Walk with walker     _X__ Bathe/dress with assistance ___ Walk Independently    ___ Shower independently ___ Walk with assistance    ___ Shower with assistance _X__ No alcohol     ___ Return to work/school ________   Special Instructions: NO WEIGHT ON LEFT LEG.    My questions have been answered and I understand these instructions. I will adhere to these goals and the provided educational materials after my discharge from the hospital.  Patient/Caregiver Signature _______________________________ Date __________  Clinician Signature _______________________________________ Date __________  Please bring this form and your medication list with you to all your follow-up doctor's appointments.      [facility]  Discharge Instructions  AV Fistula or Graft Surgery for Dialysis Access  Please refer to the following instructions for your post-procedure care. Your surgeon or physician assistant will discuss any changes with you.  Activity  You may drive the day following your surgery, if you are comfortable and no longer taking prescription pain medication. Resume full activity as the soreness in your incision resolves.  Bathing/Showering  You may shower after you go home. Keep your incision dry for 48 hours. Do not soak in a bathtub, hot tub, or swim until the incision heals completely. You may not shower if you have a  hemodialysis catheter.  Incision Care  Clean your incision with mild soap and water after 48 hours. Pat the area dry with a clean towel. You do not need a bandage unless otherwise instructed. Do not apply any ointments or creams to your incision. You may have skin glue on your incision. Do not peel it off. It will come off on its own in about one week. Your arm may swell a bit after surgery. To reduce swelling use pillows to elevate your arm so it is above your heart. Your doctor will tell you if you need to lightly wrap your arm with an ACE bandage.  Diet  Resume your normal diet. There are not special food restrictions following this procedure. In order to heal from your surgery, it is CRITICAL to get adequate nutrition. Your body requires vitamins, minerals, and protein. Vegetables are the best source of vitamins and minerals. Vegetables also provide the perfect balance of protein. Processed food has little nutritional value, so try to avoid this.  Medications  Resume taking all of your medications. If your incision is causing pain, you may take over-the counter pain relievers such as acetaminophen (Tylenol). If you were prescribed a stronger pain medication, please be aware these medications can cause nausea and constipation. Prevent nausea by taking the medication with a snack or meal. Avoid constipation by drinking plenty of fluids and eating foods with high amount of fiber, such as fruits, vegetables, and grains.  Do not take Tylenol if you are taking prescription pain medications.  Follow up Your surgeon may want to see you in the office following your access surgery. If so, this will be arranged at the time of  your surgery.  Please call us immediately for any of the following conditions:  Increased pain, redness, drainage (pus) from your incision site Fever of 101 degrees or higher Severe or worsening pain at your incision site Hand pain or numbness.  Reduce your risk of vascular  disease:  Stop smoking. If you would like help, call [facility] at 1-800-QUIT-NOW (320)674-1494) or [place] at [place] your cholesterol Maintain a desired weight Control your diabetes Keep your blood pressure down  Dialysis  It will take several weeks to several months for your new dialysis access to be ready for use. Your surgeon will determine when it is okay to use it. Your nephrologist will continue to direct your dialysis. You can continue to use your Permcath until your new access is ready for use.   10/03/2021 DANIALLE DEMENT [id number] 1960-10-28  Surgeon(s): Waynetta Sandy, MD  Procedure(s): [facility] ARTERIOVENOUS FISTULA CREATION   May stick graft immediately   May stick graft on designated area only:   X Do not stick left AV fistula for 12 weeks    If you have any questions, please call the office at [phone].

## 2021-09-26 NOTE — Consult Note (Addendum)
WOC Nurse Consult Note: ?Patient receiving care in [facility] [phone] ?Home Prevena Vac Machine was placed in the bag with patient belongings.  ?Reason for Consult: Prevena vac machine not working and can not find Oceanographer. ?Hospital vac machine ordered and patient was changed from home [place]. No dressing change, just transfer to hospital machine and programed to [facility] mode. [place] to Ainsley Spinner, PA-C with [facility] and the vac is to stay on until Wednesday of next week. Informed him where the patient is now located but unsure if he will make a visit to the patient. Any further concerns or questions concerning this should be directed to the [facility]. ? ?Thank you for the consult. [place] nurse will not follow at this time.   ?Please re-consult the [place] team if needed. ? ?Cathlean [place]. Tamala Julian, MSN, RN, CMSRN, AGCNS, WTA ?Wound Treatment Associate ?Pager [phone]   ? ? ? ?  ?

## 2021-09-26 NOTE — Progress Notes (Signed)
On call provider Reesa Chew, PA notified of pts potassium level. No new orders.  ? ?Pt given milk and molasses enema, Had extra large bowel movement. As well as an emesis episodes. Pt stated to " feel better".  ? ?

## 2021-09-26 NOTE — Progress Notes (Signed)
Inpatient Rehabilitation Center ?Individual Statement of Services ? ?Patient Name:  Jessica Mercado  ?Date:  09/26/2021 ? ?Welcome to the [place].  Our goal is to provide you with an individualized program based on your diagnosis and situation, designed to meet your specific needs.  With this comprehensive rehabilitation program, you will be expected to participate in at least 3 hours of rehabilitation therapies Monday-Friday, with modified therapy programming on the weekends. ? ?Your rehabilitation program will include the following services:  Physical Therapy (PT), Occupational Therapy (OT), Speech Therapy (ST), 24 hour per day rehabilitation nursing, Therapeutic Recreaction (TR), Neuropsychology, Care Coordinator, Rehabilitation Medicine, Nutrition Services, and Pharmacy Services ? ?Weekly team conferences will be held on Wednesdays to discuss your progress.  Your Inpatient Rehabilitation Care Coordinator will talk with you frequently to get your input and to update you on team discussions.  Team conferences with you and your family in attendance may also be held. ? ?Expected length of stay:  14-16 Days  Overall anticipated outcome:  Min A-Min G ? ?Depending on your progress and recovery, your program may change. Your Inpatient Rehabilitation Care Coordinator will coordinate services and will keep you informed of any changes. Your Inpatient Rehabilitation Care Coordinator's name and contact numbers are listed  below. ? ?The following services may also be recommended but are not provided by the [facility]:  ? ?Home Health Rehabiltiation Services ?Outpatient Rehabilitation Services ? ?  ?Arrangements will be made to provide these services after discharge if needed.  Arrangements include referral to agencies that provide these services. ? ?Your insurance has been verified to be:  Medicaid of  ?Your primary doctor is:  Vassie Moment, MD ? ?Pertinent information will be  shared with your doctor and your insurance company. ? ?Inpatient Rehabilitation Care Coordinator:  Erlene Quan, [place] or ([phone] ? ?Information discussed with and copy given to patient by: Dyanne Iha, 09/26/2021, 1:39 PM    ?

## 2021-09-26 NOTE — Plan of Care (Signed)
?  Problem: RH Balance ?Goal: LTG Patient will maintain dynamic sitting balance (PT) ?Description: LTG:  Patient will maintain dynamic sitting balance with assistance during mobility activities (PT) ?Flowsheets (Taken 09/26/2021 1110) ?LTG: Pt will maintain dynamic sitting balance during mobility activities with:: Independent ?Goal: LTG Patient will maintain dynamic standing balance (PT) ?Description: LTG:  Patient will maintain dynamic standing balance with assistance during mobility activities (PT) ?Flowsheets (Taken 09/26/2021 1110) ?LTG: Pt will maintain dynamic standing balance during mobility activities with:: Supervision/Verbal cueing ?  ?Problem: Sit to Stand ?Goal: LTG:  Patient will perform sit to stand with assistance level (PT) ?Description: LTG:  Patient will perform sit to stand with assistance level (PT) ?Flowsheets (Taken 09/26/2021 1110) ?LTG: PT will perform sit to stand in preparation for functional mobility with assistance level: Supervision/Verbal cueing ?  ?Problem: RH Bed Mobility ?Goal: LTG Patient will perform bed mobility with assist (PT) ?Description: LTG: Patient will perform bed mobility with assistance, with/without cues (PT). ?Flowsheets (Taken 09/26/2021 1110) ?LTG: Pt will perform bed mobility with assistance level of: Independent ?  ?Problem: RH Bed to Chair Transfers ?Goal: LTG Patient will perform bed/chair transfers w/assist (PT) ?Description: LTG: Patient will perform bed to chair transfers with assistance (PT). ?Flowsheets (Taken 09/26/2021 1110) ?LTG: Pt will perform Bed to Chair Transfers with assistance level: Supervision/Verbal cueing ?  ?Problem: RH Car Transfers ?Goal: LTG Patient will perform car transfers with assist (PT) ?Description: LTG: Patient will perform car transfers with assistance (PT). ?Flowsheets (Taken 09/26/2021 1110) ?LTG: Pt will perform car transfers with assist:: Contact Guard/Touching assist ?  ?Problem: RH Ambulation ?Goal: LTG Patient will ambulate in home  environment (PT) ?Description: LTG: Patient will ambulate in home environment, # of feet with assistance (PT). ?Flowsheets (Taken 09/26/2021 1110) ?LTG: Pt will ambulate in home environ  assist needed:: Supervision/Verbal cueing ?LTG: Ambulation distance in home environment: 10 ?  ?Problem: RH Wheelchair Mobility ?Goal: LTG Patient will propel w/c in controlled environment (PT) ?Description: LTG: Patient will propel wheelchair in controlled environment, # of feet with assist (PT) ?Flowsheets (Taken 09/26/2021 1110) ?LTG: Pt will propel w/c in controlled environ  assist needed:: Supervision/Verbal cueing ?LTG: Propel w/c distance in controlled environment: 150 ?Goal: LTG Patient will propel w/c in home environment (PT) ?Description: LTG: Patient will propel wheelchair in home environment, # of feet with assistance (PT). ?Flowsheets (Taken 09/26/2021 1110) ?LTG: Pt will propel w/c in home environ  assist needed:: Independent with assistive device ?LTG: Propel w/c distance in home environment: 50 ?  ?

## 2021-09-26 NOTE — Evaluation (Addendum)
Occupational Therapy Assessment and Plan ? ?Patient Details  ?Name: Jessica Mercado ?MRN: [id number] ?Date of Birth: 08-Oct-1960 ? ?OT Diagnosis: abnormal posture, muscle weakness (generalized), and pain in joint ?Rehab Potential:   ?ELOS: 12-14 days  ? ?Today's Date: 09/26/2021 ?OT Individual Time: 6160-7371 ?OT Individual Time Calculation (min): 73 min    ? ?Hospital Problem: Principal Problem: ?  Ankle fracture, bimalleolar, closed, left, sequela ? ? ?Past Medical History:  ?Past Medical History:  ?Diagnosis Date  ? 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 AKI (acute kidney injury) ([place]) 03/30/2020  ? Cataract   ? Mixed OU  ? Closed trimalleolar fracture of ankle, left, initial encounter 09/16/2021  ? Diabetes mellitus without complication ([place])   ? Diabetic retinopathy ([place])   ? NPDR OU  ? Elevated d-dimer 07/20/2021  ? Hyperlipemia   ? Hyperlipidemia 07/18/2021  ? Hypertension   ? Hypertensive retinopathy   ? OU  ? Hypertensive urgency 07/20/2021  ? Pneumonia 03/29/2020  ? Tendinopathy of left rotator cuff   ? ?Past Surgical History:  ?Past Surgical History:  ?Procedure Laterality Date  ? ANKLE ARTHROSCOPY WITH OPEN REDUCTION INTERNAL FIXATION (ORIF)    ? EXCHANGE OF A DIALYSIS CATHETER N/A 09/24/2021  ? Procedure: EXCHANGE OF A TEMPORARY DIALYSIS CATHETER TO PERMANENT DIALYSIS CATHETER;  Surgeon: Angelia Mould, MD;  [facility] OR;  Service: Vascular;  Laterality: N/A;  ? INSERTION OF DIALYSIS CATHETER N/A 09/17/2021  ? Procedure: INSERTION OF TUNNELED DIALYSIS CATHETER;  Surgeon: Waynetta Sandy, MD;  Location: [place];  Service: Vascular;  Laterality: N/A;  ? ORIF ANKLE FRACTURE Left 09/17/2021  ? Procedure: OPEN REDUCTION INTERNAL FIXATION (ORIF) ANKLE FRACTURE;  Surgeon: [place]e [place], MD;  Location: [place];  Service: Orthopedics;  Laterality: Left;  ? ? ?Assessment & Plan ?Clinical Impression: Patient is a 61 y.o. year old female    with history of T2DM with retinopathy,  bipolar disorder, HTN with nephropathy, right ankle fracture, 2-3 months of recurrent falls who was admitted on 09/17/21 after fall with left ankle dislocation noted by family, N/V, fatigue, tremors and increase in confusion. She was noted to be uremic and found to have oblique displaced left trimalleolar ankle fracture with subluxation which was splinted. Nephrology consulted felt that patient has progressed to ESRD with falls due to uremia and patient agreeable to start HD. On 09/17/21, she underwent ORIF right ankle by Dr. Marcelino Scot and [facility] placed by Dr. Donzetta Matters.  Post op to be strict NWB on LLE X 8 weeks, splint X 2 weeks with incisional Prevena and Eliquis for 30 days for DVT prophylaxis at discharge. Pain has been controlled with use of tylenol and HD ongoing MWF. She did require exchange of IJ catheter on 05/02 due to poor flow volumes.  Has been CLIP'd for [place] MWF.  ?  ?PT/OT has been working with patient and has noted that she continues to touch down due to difficulty maintaining NWB, difficulty with processing information as well and requires mod assist with ADL/mobility. Patient has declined SNF due to prior hx of trauma and is showing good motivation and some progress. Patient transferred to CIR on 09/25/2021 .   ? ?Patient currently requires mod with basic self-care skills secondary to muscle weakness and ankle instability, decreased cardiorespiratoy endurance, and decreased awareness, decreased problem solving, and decreased safety awareness.  Prior to hospitalization, patient could complete BADL with modified independent . ? ?Patient will benefit from skilled intervention to increase independence with  basic self-care skills prior to discharge home with care partner.  Anticipate patient will require intermittent supervision and no further OT follow recommended. ? ?OT - End of Session ?Activity Tolerance: Decreased this session ?Endurance Deficit: Yes ?OT Assessment ?OT Barriers to Discharge: Weight  bearing restrictions ?OT Barriers to Discharge Comments: compliance with WB restrictions ?OT Patient demonstrates impairments in the following area(s): Balance;Behavior;Safety;Endurance;Motor;Pain ?OT Basic ADL's Functional Problem(s): Bathing;Dressing;Toileting ?OT Transfers Functional Problem(s): Toilet;Tub/Shower ?OT Plan ?OT Intensity: Minimum of 1-2 x/day, 45 to 90 minutes ?OT Frequency: 5 out of 7 days ?OT Duration/Estimated Length of Stay: 12-14 days ?OT Treatment/Interventions: Balance/vestibular training;Self Care/advanced ADL retraining;Therapeutic Activities;Pain management;Discharge planning;Functional mobility training;Patient/family education;Skin care/wound managment;Therapeutic Exercise;UE/LE Strength taining/ROM;DME/adaptive equipment instruction ?OT Basic Self-Care Anticipated Outcome(s): Modified I ?OT Toileting Anticipated Outcome(s): Modified I ?OT Bathroom Transfers Anticipated Outcome(s): Modified I ?OT Recommendation ?Patient destination: Home ?Follow Up Recommendations: None ? ? 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, newly on HD - MWF schedule ?PT Missed Treatment Reason: Patient fatigue;Patient unwilling to participate ?Response to Previous Treatment:  (Patient states "I do not like to be rushed"  "I get upset when you do things for me that I can do") ?Family/Caregiver Present: No ?Vital Signs ?Therapy Vitals ?Temp: 98.1 ?F (36.7 ?C) ?Temp Source: Oral ?Pulse Rate: 72 ?Resp: 18 ?BP: 130/73 ?Patient Position (if appropriate): Sitting ?Oxygen  Therapy ?SpO2: 99 % ?Pain ?Pain Assessment ?Pain Score: 0-No pain ?Home Living/Prior Functioning ?Home Living ?Family/patient expects to be discharged to:: Private residence ?Living Arrangements: Other (Comment) (SIL) ?Available Help at Discharge: Family, Available 24 hours/day, Available PRN/intermittently ?Type of Home: House ?Home Access: Stairs to enter, Ramped entrance ?Entrance Stairs-Number of Steps: 1 ?Home Layout: One level ?Bathroom Shower/Tub: Tub/shower unit ?Bathroom Toilet: Standard ?Bathroom Accessibility: Yes ? Lives With: Family, Other (Comment) ?IADL History ?Homemaking Responsibilities: No ?Current License: No ?Mode of Transportation: Family ?Occupation: On disability ?Prior Function ?Level of Independence: Independent with basic ADLs, Independent with homemaking with ambulation, Requires assistive device for independence, Independent with transfers ? Able to Take Stairs?: No ?Driving: No ?Vocation: On disability ?Vision ?Baseline Vision/History: 1 Wears glasses ?Ability to See in Adequate Light: 0 Adequate ?Patient Visual Report: No change from baseline ?Vision Assessment?: No apparent visual deficits ?Perception  ?Perception: Within Functional Limits ?Praxis ?Praxis: Intact ?Cognition ?Cognition ?Overall Cognitive Status: History of cognitive impairments - at baseline ?Arousal/Alertness: Awake/alert ?Orientation Level: Person;Place;Situation ?Person: Oriented ?Place: Oriented ?Situation: Oriented ?Memory: Appears intact ?Attention: Sustained ?Sustained Attention: Appears intact ?Awareness: Impaired ?Awareness Impairment: Intellectual impairment ?Problem Solving: Impaired ?Problem Solving Impairment: Functional basic ?Behaviors: Poor frustration tolerance ?Safety/Judgment: Impaired ?Comments: Can state NWB status for LLE - Not able to follow - Disregards cueing ?Brief Interview for Mental Status (BIMS) ?Repetition of Three Words (First Attempt): 3 ?Temporal Orientation: Year: Correct ?Temporal  Orientation: Month: Accurate within 5 days ?Temporal Orientation: Day: Incorrect ?Recall: "Sock": Yes, after cueing ("something to wear") ?Recall: "Blue": Yes, no cue required ?Recall: "Bed": Yes, no cue required ?BIMS S

## 2021-09-26 NOTE — Progress Notes (Signed)
Inpatient Rehabilitation Admission Medication Review by a Pharmacist ? ?A complete drug regimen review was completed for this patient to identify any potential clinically significant medication issues. ? ?High Risk Drug Classes Is patient taking? Indication by Medication  ?Antipsychotic Yes Latuda - bipolar ?Compazine prn - nausea/vomiting  ?Anticoagulant Yes SQ heparin - VTE prophylaxis  ?Antibiotic No   ?Opioid Yes Oxycodone prn - pain control  ?Antiplatelet No   ?Hypoglycemics/insulin Yes SSI, Semaglutide (Ozempic, home supply to be provided) - glucose control  ?Vasoactive Medication No   ?Chemotherapy No   ?Other No Tylenol BID - pain control ?Gabapentin - neuropathic pain ?Atorvastatin - HLD ?Toprol, Prazosin - HTN ?Prozac, Trazodone - bipolar ?Benztropine - EPS prevention ?Renal vitamin - supplement ?Darbepoetin - anemia ?Ferrlecit w/ HD for 5 more doses- iron supplement  ? ? ? ?Type of Medication Issue Identified Description of Issue Recommendation(s)  ?Drug Interaction(s) (clinically significant) ?    ?Duplicate Therapy ?    ?Allergy ?    ?No Medication Administration End Date ?    ?Incorrect Dose ?    ?Additional Drug Therapy Needed ?    ?Significant med changes from prior encounter (inform family/care partners about these prior to discharge). Multiple meds discontinued during inpatient admission and to stay off: ?Glipizide, Insulin 75/25, Torsemide, Hydroxyzine, Calcium supplement, weekly Vitamin D  ? ?Gabapentin dose reduced due to renal function.  MVI changed to renal vitamin.  ? ? ? ? ? ?Continue lower dose of Gabapentin and renal vitamin.  ?Other ? Ortho recommended Apixaban 2.5 mg PO BID for 30 days at discharge 5/3.  ?- To continue SQ heparin while on CIR.  Continue VTE prophylaxis for 30 days post-op per Ortho recommendation. Adjust length of therapy as needed based on mobility.   ?  ? ? ?Clinically significant medication issues were identified that warrant physician communication and completion of  prescribed/recommended actions by midnight of the next day:  Yes ? ?Name of provider notified for urgent issues identified:  Reesa Chew, PA-C ? ?Provider Method of Notification: secure chat ? ?Pharmacist comments:  ? - Discharge summary note plan to use Apixaban 2.5 mg PO BID x 30 days for VTE prophylaxis.  Per P. Love, PA-C, plan to continue SQ heparin for VTE prophlyaxis at this time.  May consider shorter course of Apixaban at discharge from CIR. ? ? - Semaglutide (Ozempic) weekly on Mondays to continue. Not stocked at [facility].  Family to provide home supply for use while in the hospital. ? ?Time spent performing this drug regimen review (minutes):  30 ? ? ?Arty Baumgartner, [place] ?09/26/2021 11:54 AM ?

## 2021-09-26 NOTE — Progress Notes (Signed)
Physical Therapy Session Note ? ?Patient Details  ?Name: DERISHA FUNDERBURKE ?MRN: [id number] ?Date of Birth: 05-01-1961 ? ?Today's Date: 09/26/2021 ?PT Missed Time: 60 Minutes ?Missed Time Reason: Patient fatigue;Patient unwilling to participate ? ?Short Term Goals: ?Week 1:  PT Short Term Goal 1 (Week 1): Patient will adhere to weight bearing precautions with all mobility at least 50% of the time ?PT Short Term Goal 2 (Week 1): Patient will transfer bed <> wc with LRAD and ModA ?PT Short Term Goal 3 (Week 1): Patient will complete sit <> stand with LRAD and CGA while maintaining wbng precautions ?PT Short Term Goal 4 (Week 1): Patient will initiate wc mob ? ?Skilled Therapeutic Interventions/Progress Updates:  ?Patient received in bed, asleep. Difficult to wake and very upset at this PT for waking her up. PT unable to redirect and encourage patient to participate in therapy. She adamantly refuses therapy at this time stating that she's tired. Remaining in bed, bed alarm on, call light within reach.  ? ?Therapy Documentation ?Precautions:  ?Precautions ?Precautions: Fall ?Precaution Comments: wound vac L LE ?Required Braces or Orthoses: Splint/Cast ?Restrictions ?Weight Bearing Restrictions: Yes ?RLE Weight Bearing: Weight bearing as tolerated ?LLE Weight Bearing: Non weight bearing ? ? ? ?Therapy/Group: Individual Therapy ? ?Debbora Dus ?Debbora Dus, PT, DPT, CBIS ? ?09/26/2021, 2:15 PM  ?

## 2021-09-26 NOTE — Progress Notes (Signed)
[facility] ? ?Appreciate communication from [place] ?Continue with prevena setting on hospital vac unit to Left ankle until 10/02/2021. At that time will perform dressing change and convert to short leg cast  ? ?NWB L leg for 8 weeks from date of surgery  ? ?Please call with questions in the interim pertaining to L ankle and/or VAC.  As long as there is a good seal on the VAC and alarms not sounding on machine there is no need to take down the vac as that was put on under sterile conditions in OR  ? ?Jari Pigg, PA-C ?[phone] (C) ?09/26/2021, 8:41 AM ? ?[facility] ?[place] [address] ?[phone] Jenetta Downer) ?[phone] (F) ? ?  ? ? ? ?Patient ID: Jessica Mercado, female   DOB: 17-Oct-1960, 61 y.o.   MRN: [id number] ? ?

## 2021-09-26 NOTE — Progress Notes (Signed)
Patient ID: Jessica Mercado, female   DOB: [birth year], 61 y.o.   MRN: [id number] ?Met with the patient to review rehab process, team conference and plan of care. Discussed current situation and wound vac. Discussed DM (A1C 5.7), HTN, and ESRD. Reported transportation arranged for appointments; reviewed HD; M-W-F at [facility]. Patient noted SIL; [name] keeps up with medications and diet. Reports poor appetite since HD started; change in taste and feels full sooner. Reviewed handouts on renal diet and diabetes management with HTN and pain control. Continue to follow along to discharge to address educational needs to facilitate preparation for discharge home. Dorien Chihuahua B ? ?

## 2021-09-26 NOTE — Progress Notes (Signed)
Inpatient Rehabilitation Care Coordinator ?Assessment and Plan ?Patient Details  ?Name: Jessica Mercado ?MRN: [id number] ?Date of Birth: 06-23-1960 ? ?Today's Date: 09/26/2021 ? ?Hospital Problems: Principal Problem: ?  Ankle fracture, bimalleolar, closed, left, sequela ? ?Past Medical History:  ?Past Medical History:  ?Diagnosis Date  ? 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 AKI (acute kidney injury) ([place]) 03/30/2020  ? Cataract   ? Mixed OU  ? Closed trimalleolar fracture of ankle, left, initial encounter 09/16/2021  ? Diabetes mellitus without complication ([place])   ? Diabetic retinopathy ([place])   ? NPDR OU  ? Elevated d-dimer 07/20/2021  ? Hyperlipemia   ? Hyperlipidemia 07/18/2021  ? Hypertension   ? Hypertensive retinopathy   ? OU  ? Hypertensive urgency 07/20/2021  ? Pneumonia 03/29/2020  ? Tendinopathy of left rotator cuff   ? ?Past Surgical History:  ?Past Surgical History:  ?Procedure Laterality Date  ? ANKLE ARTHROSCOPY WITH OPEN REDUCTION INTERNAL FIXATION (ORIF)    ? EXCHANGE OF A DIALYSIS CATHETER N/A 09/24/2021  ? Procedure: EXCHANGE OF A TEMPORARY DIALYSIS CATHETER TO PERMANENT DIALYSIS CATHETER;  Surgeon: Angelia Mould, MD;  Location: [facility] OR;  Service: Vascular;  Laterality: N/A;  ? INSERTION OF DIALYSIS CATHETER N/A 09/17/2021  ? Procedure: INSERTION OF TUNNELED DIALYSIS CATHETER;  Surgeon: Waynetta Sandy, MD;  Location: [place];  Service: Vascular;  Laterality: N/A;  ? ORIF ANKLE FRACTURE Left 09/17/2021  ? Procedure: OPEN REDUCTION INTERNAL FIXATION (ORIF) ANKLE FRACTURE;  Surgeon: Altamese [place] Crossings, MD;  Location: [place];  Service: Orthopedics;  Laterality: Left;  ? ?Social History:  reports that she has never smoked. She has never used smokeless tobacco. She reports that she does not currently use alcohol. She reports that she does not currently use drugs. ? ?Family / Support Systems ?Children: Bill (SIL) ?Anticipated Caregiver: [name] (SIL) ?Ability/Limitations of  Caregiver: MIN A ?Caregiver Availability: 24/7 ?Family Dynamics: support from SIL ? ?Social History ?Preferred language: English ?Religion: None ?Health Literacy - How often do you need to have someone help you when you read instructions, pamphlets, or other written material from your doctor or pharmacy?: Sometimes ?Writes: Yes  ? ?Abuse/Neglect ?Abuse/Neglect Assessment Can Be Completed: Yes ?Physical Abuse: Denies ?Verbal Abuse: Denies ?Sexual Abuse: Denies ?Exploitation of patient/patient's resources: Denies ?Self-Neglect: Denies ? ?Patient response to: ?Social Isolation - How often do you feel lonely or isolated from those around you?: Never ? ?Emotional Status ?Recent Psychosocial Issues: coping ? ?Patient / Family Perceptions, Expectations & Goals ?Pt/Family understanding of illness & functional limitations: yes ?Premorbid pt/family roles/activities: previosuly active and requiring some assistance ?Anticipated changes in roles/activities/participation: patient SIL plans to continue to assist at d/c ?Pt/family expectations/goals: [place] A-[facility] G ? ?Community Resources ?Community Agencies: None ?Premorbid Home Care/DME Agencies: Other (Comment) Librarian, academic, Single [employer], Civil engineer, contracting, Radiation protection practitioner) ?Transportation available at discharge: [employer] ?Is the patient able to respond to transportation needs?: Yes ?In the past 12 months, has lack of transportation kept you from medical appointments or from getting medications?: No ?In the past 12 months, has lack of transportation kept you from meetings, work, or from getting things needed for daily living?: No ?Resource referrals recommended: Neuropsychology ? ?Discharge Planning ?Living Arrangements: Other (Comment) (SIL) ?Support Systems: Other (Comment) ?Type of Residence: Private residence ?Insurance Resources: [employer] (specify county) ?Financial Resources: Family Support ?Financial Screen Referred: No ?Living Expenses: Lives with family ?Money  Management: Patient, Family ?Does the patient have any problems obtaining your medications?: No ?Home  Management: Independent ?Patient/Family Preliminary Plans: SIL able to assist if needed ?Care Coordinator Barriers to Discharge: Lack of/limited family support ?Care Coordinator Anticipated Follow Up Needs: HH/OP ?Expected length of stay: 14-16 Days ? ?Clinical Impression ?Sw met with patient and spoke with SIL via telephone, introduced self, explained role and addressed questions and concerns.  ? ?Dyanne Iha ?09/26/2021, 1:56 PM ? ?  ?

## 2021-09-26 NOTE — Plan of Care (Signed)
?  Problem: RH Balance ?Goal: LTG Patient will maintain dynamic standing with ADLs (OT) ?Description: LTG:  Patient will maintain dynamic standing balance with assist during activities of daily living (OT)  ?Flowsheets (Taken 09/26/2021 1611) ?LTG: Pt will maintain dynamic standing balance during ADLs with: Independent with assistive device ?  ?Problem: Sit to Stand ?Goal: LTG:  Patient will perform sit to stand in prep for activites of daily living with assistance level (OT) ?Description: LTG:  Patient will perform sit to stand in prep for activites of daily living with assistance level (OT) ?Flowsheets (Taken 09/26/2021 1611) ?LTG: PT will perform sit to stand in prep for activites of daily living with assistance level: Independent with assistive device ?  ?Problem: RH Dressing ?Goal: LTG Patient will perform lower body dressing w/assist (OT) ?Description: LTG: Patient will perform lower body dressing with assist, with/without cues in positioning using equipment (OT) ?Flowsheets (Taken 09/26/2021 1611) ?LTG: Pt will perform lower body dressing with assistance level of: Independent with assistive device ?  ?Problem: RH Toileting ?Goal: LTG Patient will perform toileting task (3/3 steps) with assistance level (OT) ?Description: LTG: Patient will perform toileting task (3/3 steps) with assistance level (OT)  ?Flowsheets (Taken 09/26/2021 1611) ?LTG: Pt will perform toileting task (3/3 steps) with assistance level: Independent with assistive device ?  ?Problem: RH Simple Meal Prep ?Goal: LTG Patient will perform simple meal prep w/assist (OT) ?Description: LTG: Patient will perform simple meal prep with assistance, with/without cues (OT). ?Flowsheets (Taken 09/26/2021 1611) ?LTG: Pt will perform simple meal prep with assistance level of: Minimal Assistance - Patient > 75% ?  ?Problem: RH Toilet Transfers ?Goal: LTG Patient will perform toilet transfers w/assist (OT) ?Description: LTG: Patient will perform toilet transfers with  assist, with/without cues using equipment (OT) ?Flowsheets (Taken 09/26/2021 1611) ?LTG: Pt will perform toilet transfers with assistance level of: Independent with assistive device ?  ?Problem: RH Tub/Shower Transfers ?Goal: LTG Patient will perform tub/shower transfers w/assist (OT) ?Description: LTG: Patient will perform tub/shower transfers with assist, with/without cues using equipment (OT) ?Flowsheets (Taken 09/26/2021 1611) ?LTG: Pt will perform tub/shower stall transfers with assistance level of: Independent with assistive device ?  ?Problem: RH Furniture Transfers ?Goal: LTG Patient will perform furniture transfers w/assist (OT/PT) ?Description: LTG: Patient will perform furniture transfers  with assistance (OT/PT). ?Flowsheets (Taken 09/26/2021 1611) ?LTG: Pt will perform furniture transfers with assist:: Independent with assistive device  ?  ?

## 2021-09-26 NOTE — Progress Notes (Signed)
?                                                       PROGRESS NOTE ? ? ?Subjective/Complaints: ?Jessica Mercado feels miserable today because she wants to go home ?Says son and roommate can help her at home ? ?ROS: denies pain ? ? ?Objective: ?  ?DG Chest Port 1 View ? ?Result Date: 09/24/2021 ?CLINICAL DATA:  Status post procedure. EXAM: PORTABLE CHEST 1 VIEW COMPARISON:  September 17, 2021. FINDINGS: Stable cardiomegaly. Right internal jugular dialysis catheter is unchanged. No pneumothorax is noted. Right lung is clear. Mild left basilar subsegmental atelectasis is noted. Bony thorax is unremarkable. IMPRESSION: Stable mild left basilar subsegmental atelectasis. Electronically Signed   By: Marijo Conception M.D.   On: 09/24/2021 14:38  ? ?DG C-Arm 1-60 Min-No Report ? ?Result Date: 09/24/2021 ?Fluoroscopy was utilized by the requesting physician.  No radiographic interpretation.   ?Recent Labs  ?  09/25/21 ?0647 09/25/21 ?1736  ?WBC 12.4* 13.5*  ?HGB 9.1* 9.1*  ?HCT 27.3* 26.9*  ?PLT 228 235  ? ?Recent Labs  ?  09/24/21 ?1217 09/25/21 ?1736  ?NA 134* 135  ?K 3.9 3.4*  ?CL 99 99  ?CO2  --  26  ?GLUCOSE 168* 209*  ?BUN 21* 10  ?CREATININE 4.10* 2.31*  ?CALCIUM  --  8.5*  ? ? ?Intake/Output Summary (Last 24 hours) at 09/26/2021 1117 ?Last data filed at 09/26/2021 0816 ?Gross per 24 hour  ?Intake 237 ml  ?Output 0 ml  ?Net 237 ml  ?  ? ?Pressure Injury 09/25/21 Sacrum Medial;Upper Stage 2 -  Partial thickness loss of dermis presenting as a shallow open injury with a red, pink wound bed without slough. 1 mm x 35mm (Active)  ?09/25/21 2249  ?Location: Sacrum  ?Location Orientation: Medial;Upper  ?Staging: Stage 2 -  Partial thickness loss of dermis presenting as a shallow open injury with a red, pink wound bed without slough.  ?Wound Description (Comments): 1 mm x 39mm (Staged by DARA, RN)  ?Present on Admission: Yes  ? ? ?Physical Exam: ?Vital Signs ?Blood pressure 126/73, pulse 74, temperature 98.7 ?F (37.1 ?C), resp. rate 18, height  5\' 7"  (1.702 m), weight 85.7 kg, SpO2 97 %. ?Gen: no distress, normal appearing ?HEENT: oral mucosa pink and moist, NCAT ?Cardio: Reg rate ?Chest: normal effort, normal rate of breathing ?Abd: soft, non-distended ?Ext: trace RLE edema, left forefoot has wound vac ?Psych: pleasant, normal affect ?Skin: right IJ TDC ?Neuro/MSK: Alert. Lef foot in ace bandage with wound vac. Impaired problem solving ? ?Assessment/Plan: ?1. Functional deficits which require 3+ hours per day of interdisciplinary therapy in a comprehensive inpatient rehab setting. ?Physiatrist is providing close team supervision and 24 hour management of active medical problems listed below. ?Physiatrist and rehab team continue to assess barriers to discharge/monitor patient progress toward functional and medical goals ? ?Care Tool: ? ?Bathing ?   ?   ?   ?  ?  ?Bathing assist   ?  ?  ?Upper Body Dressing/Undressing ?Upper body dressing   ?  ?   ?Upper body assist   ?   ?Lower Body Dressing/Undressing ?Lower body dressing ? ? ?   ?What is the patient wearing?: Incontinence brief ? ?  ? ?Lower body assist Assist for lower body dressing:  Minimal Assistance - Patient > 75% ?   ? ?Toileting ?Toileting    ?Toileting assist Assist for toileting: Maximal Assistance - Patient 25 - 49% ?  ?  ?Transfers ?Chair/bed transfer ? ?Transfers assist ? Chair/bed transfer activity did not occur: Refused ? ?  ?  ?  ?Locomotion ?Ambulation ? ? ?Ambulation assist ? ? Ambulation activity did not occur: Refused ? ?  ?  ?   ? ?Walk 10 feet activity ? ? ?Assist ? Walk 10 feet activity did not occur: Refused ? ?  ?   ? ?Walk 50 feet activity ? ? ?Assist Walk 50 feet with 2 turns activity did not occur: Safety/medical concerns ? ?  ?   ? ? ?Walk 150 feet activity ? ? ?Assist Walk 150 feet activity did not occur: Safety/medical concerns ? ?  ?  ?  ? ?Walk 10 feet on uneven surface  ?activity ? ? ?Assist Walk 10 feet on uneven surfaces activity did not occur: Safety/medical  concerns ? ? ?  ?   ? ?Wheelchair ? ? ? ? ?Assist Is the patient using a wheelchair?: Yes ?Type of Wheelchair: Manual ?Wheelchair activity did not occur: Refused ? ?  ?   ? ? ?Wheelchair 50 feet with 2 turns activity ? ? ? ?Assist ? ?  ?Wheelchair 50 feet with 2 turns activity did not occur: Refused ? ? ?   ? ?Wheelchair 150 feet activity  ? ? ? ?Assist ? Wheelchair 150 feet activity did not occur: Refused ? ? ?   ? ?Blood pressure 126/73, pulse 74, temperature 98.7 ?F (37.1 ?C), resp. rate 18, height 5\' 7"  (1.702 m), weight 85.7 kg, SpO2 97 %. ? ? ? Medical Problem List and Plan: ?1. Functional deficits secondary to left trimalleolar ankle fracture ?            -patient may not shower until wound vac removal. ?            -ELOS/Goals: 14-16 days modI ?            -Admit to CIR ?2.  Antithrombotics: ?-DVT/anticoagulation:  Pharmaceutical: Heparin ?            -antiplatelet therapy: N/a ?3. Pain Management: Tylenol BID with oxycodone prn.  ?4. Mood: LCSW to follow for evaluation and support.  ?            -antipsychotic agents: Latuda ?5. Neuropsych: This patient is capable of making decisions on her own behalf. ?6. Left lower extremity trimalleolar fracture: wound avc to be removed Wednesday 5/10 ?7. Obesity: provide dietary education. Restart Semaglutide. ?9. 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 HTN: Monitor BP TID ?11. T2DM: Hgb A1C- 5.7 (down from 6.6  a couple of  months ago)--was on glucotrol and ozempic PTA ?            --Monitor BS ac/hs. Use SSI for elevated BS.  ? -restart Semaglutide.  ?12. Bipolar disorder: On Latuda, Trazodone and Prozac.  ?--Parkinsonism/EPS  managed with benztropine.  ?13. Anemia of chronic disease: continue to monitor Hgb with HD labs.  ?14. Screening for vitamin D deficiency: Vitamin D, 25 hydroxyl-: Level high normal @ 93.97. Does not need supplementation ?15. Hyponatremia: continue to monitor Na with HD labs ? ?LOS: ?1 days ?A FACE TO FACE EVALUATION WAS PERFORMED ? ?Martha Clan P  Marten Iles ?09/26/2021, 11:17 AM  ? ?  ?

## 2021-09-26 NOTE — Progress Notes (Signed)
Inpatient Rehabilitation  Patient information reviewed and entered into eRehab system by Gennesis Hogland Demetris Capell, OTR/L.   Information including medical coding, functional ability and quality indicators will be reviewed and updated through discharge.    

## 2021-09-26 NOTE — Evaluation (Signed)
Physical Therapy Assessment and Plan ? ?Patient Details  ?Name: Jessica Mercado ?MRN: [id number] ?Date of Birth: 06-16-1960 ? ?PT Diagnosis: Abnormal posture, Abnormality of gait, Cognitive deficits, Muscle weakness, and Pain in joint ?Rehab Potential: Good ?ELOS: 12-14 days  ? ?Today's Date: 09/26/2021 ?PT Individual Time: 1015-1100 ?PT Individual Time Calculation (min): 45 min  and Today's Date: 09/26/2021 ?PT Missed Time: 30 Minutes ?Missed Time Reason: Patient fatigue;Patient unwilling to participate  ? ?Hospital Problem: Principal Problem: ?  Ankle fracture, bimalleolar, closed, left, sequela ? ? ?Past Medical History:  ?Past Medical History:  ?Diagnosis Date  ? 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 AKI (acute kidney injury) ([place]) 03/30/2020  ? Cataract   ? Mixed OU  ? Closed trimalleolar fracture of ankle, left, initial encounter 09/16/2021  ? Diabetes mellitus without complication ([place])   ? Diabetic retinopathy ([place])   ? NPDR OU  ? Elevated d-dimer 07/20/2021  ? Hyperlipemia   ? Hyperlipidemia 07/18/2021  ? Hypertension   ? Hypertensive retinopathy   ? OU  ? Hypertensive urgency 07/20/2021  ? Pneumonia 03/29/2020  ? Tendinopathy of left rotator cuff   ? ?Past Surgical History:  ?Past Surgical History:  ?Procedure Laterality Date  ? ANKLE ARTHROSCOPY WITH OPEN REDUCTION INTERNAL FIXATION (ORIF)    ? EXCHANGE OF A DIALYSIS CATHETER N/A 09/24/2021  ? Procedure: EXCHANGE OF A TEMPORARY DIALYSIS CATHETER TO PERMANENT DIALYSIS CATHETER;  Surgeon: Angelia Mould, MD;  Location: [facility] OR;  Service: Vascular;  Laterality: N/A;  ? INSERTION OF DIALYSIS CATHETER N/A 09/17/2021  ? Procedure: INSERTION OF TUNNELED DIALYSIS CATHETER;  Surgeon: Waynetta Sandy, MD;  Location: [facility];  Service: Vascular;  Laterality: N/A;  ? ORIF ANKLE FRACTURE Left 09/17/2021  ? Procedure: OPEN REDUCTION INTERNAL FIXATION (ORIF) ANKLE FRACTURE;  Surgeon: Altamese Coplay, MD;  Location: [facility];  Service: Orthopedics;   Laterality: Left;  ? ? ?Assessment & Plan ?Clinical Impression: Patient is a 61 y.o. year old female with history of T2DM with retinopathy, bipolar disorder, HTN with nephropathy, right ankle fracture, 2-3 months of recurrent falls who was admitted on 09/17/21 after fall with left ankle dislocation noted by family, N/V, fatigue, tremors and increase in confusion. She was noted to be uremic and found to have oblique displaced left trimalleolar ankle fracture with subluxation which was splinted. Nephrology consulted felt that patient has progressed to ESRD with falls due to uremia and patient agreeable to start HD. On 09/17/21, she underwent ORIF right ankle by Dr. Marcelino Scot and [facility] placed by Dr. Donzetta Matters.  Post op to be strict NWB on LLE X 8 weeks, splint X 2 weeks with incisional Prevena and Eliquis for 30 days for DVT prophylaxis at discharge. Pain has been controlled with use of tylenol and HD ongoing MWF. She did require exchange of IJ catheter on 05/02 due to poor flow volumes.  Has been CLIP'd for [place] MWF.  ?  ?PT/OT has been working with patient and has noted that she continues to touch down due to difficulty maintaining NWB, difficulty with processing information as well and requires mod assist with ADL/mobility. Patient has declined SNF due to prior hx of trauma and is showing good motivation and some progress. CIR recommended due to functional decline. Complains of chronic left shoulder pain.  ? ?Patient currently requires  refused most mobility   with mobility secondary to muscle weakness, decreased cardiorespiratoy endurance, decreased visual acuity, decreased awareness, decreased problem solving, decreased safety awareness, and decreased  memory, and decreased sitting balance, decreased standing balance, decreased postural control, decreased balance strategies, and difficulty maintaining precautions.  Prior to hospitalization, patient was modified independent  with mobility and lived with Family, Other  (Comment) Data processing manager (son in Sports coach); Roomate Peter) in a House home.  Home access is 1Stairs to enter, Ramped entrance. ? ?Patient will benefit from skilled PT intervention to maximize safe functional mobility, minimize fall risk, and decrease caregiver burden for planned discharge home with 24 hour supervision.  Anticipate patient will benefit from follow up [place] at discharge. ? ?PT - End of Session ?Activity Tolerance: Tolerates < 10 min activity, no significant change in vital signs ?Endurance Deficit: Yes ?PT Assessment ?Rehab Potential (ACUTE/IP ONLY): Good ?PT Barriers to Discharge: Decreased caregiver support;Home environment access/layout;Wound Care;Lack of/limited family support;Insurance for SNF coverage;Hemodialysis;Weight bearing restrictions;Behavior ?PT Patient demonstrates impairments in the following area(s): Balance;Behavior;Edema;Endurance;Motor;Nutrition;Pain;Perception;Safety;Sensory;Skin Integrity ?PT Transfers Functional Problem(s): Bed Mobility;Bed to Chair;Car;Furniture ?PT Locomotion Functional Problem(s): Ambulation;Wheelchair Mobility ?PT Plan ?PT Intensity: Minimum of 1-2 x/day ,45 to 90 minutes ?PT Frequency: 5 out of 7 days ?PT Duration Estimated Length of Stay: 12-14 days ?PT Treatment/Interventions: Ambulation/gait training;Discharge planning;Functional mobility training;Psychosocial support;Therapeutic Activities;Visual/perceptual remediation/compensation;Balance/vestibular training;Disease management/prevention;Neuromuscular re-education;Skin care/wound management;Therapeutic Exercise;Wheelchair propulsion/positioning;Cognitive remediation/compensation;DME/adaptive equipment instruction;Pain management;Splinting/orthotics;UE/LE Strength taining/ROM;Community reintegration;Functional electrical stimulation;Patient/family education;Stair training;UE/LE Coordination activities ?PT Transfers Anticipated Outcome(s): spv ?PT Locomotion Anticipated Outcome(s): spv wc level ?PT  Recommendation ?Recommendations for Other Services: Speech consult ?Follow Up Recommendations: Home health PT;24 hour supervision/assistance ?Patient destination: Home ?Equipment Recommended: To be determined ? ? ?PT Evaluation ?Precautions/Restrictions ?Precautions ?Precautions: Fall ?Precaution Comments: wound vac L LE ?Required Braces or Orthoses: Splint/Cast ?Restrictions ?Weight Bearing Restrictions: Yes ?RLE Weight Bearing: Weight bearing as tolerated ?LLE Weight Bearing: Non weight bearing ?General ?  Vital Signs  ?Pain ?Pain Assessment ?Pain Scale: 0-10 ?Pain Score: 0-No pain ?Pain Interference ?Pain Interference ?Pain Effect on Sleep: 0. Does not apply - I have not had any pain or hurting in the past 5 days ?Pain Interference with Therapy Activities: 0. Does not apply - I have not received rehabilitationtherapy in the past 5 days ?Pain Interference with Day-to-Day Activities: 1. Rarely or not at all ?Home Living/Prior Functioning ?Home Living ?Available Help at Discharge: Family;Available 24 hours/day;Available PRN/intermittently ?Type of Home: House ?Home Access: Stairs to enter;Ramped entrance ?Entrance Stairs-Number of Steps: 1 ?Home Layout: One level ?Bathroom Shower/Tub: Tub/shower unit ?Bathroom Toilet: Standard ?Bathroom Accessibility: Yes ? Lives With: Family;Other (Comment) Data processing manager (son in law); Collene Gobble) ?Prior Function ?Level of Independence: Independent with basic ADLs;Independent with homemaking with ambulation;Requires assistive device for independence;Independent with transfers ? Able to Take Stairs?: No ?Driving: No ?Vocation: On disability ?Vision/Perception  ?Vision - History ?Ability to See in Adequate Light: 0 Adequate ?Perception ?Perception: Within Functional Limits ?Praxis ?Praxis: Intact  ?Cognition ?Overall Cognitive Status: History of cognitive impairments - at baseline ?Arousal/Alertness: Awake/alert ?Year: 2023 ?Month: May ?Day of Week: Correct ?Attention: Sustained ?Sustained  Attention: Appears intact ?Memory: Appears intact ?Awareness: Appears intact ?Problem Solving: Impaired ?Safety/Judgment: Impaired ?Sensation ?Sensation ?Light Touch: Appears Intact ?Hot/Cold: Not tested ?Proprioception: A

## 2021-09-27 DIAGNOSIS — L8990 Pressure ulcer of unspecified site, unspecified stage: Secondary | ICD-10-CM | POA: Insufficient documentation

## 2021-09-27 DIAGNOSIS — S82842S Displaced bimalleolar fracture of left lower leg, sequela: Secondary | ICD-10-CM | POA: Diagnosis not present

## 2021-09-27 DIAGNOSIS — Z4689 Encounter for fitting and adjustment of other specified devices: Secondary | ICD-10-CM | POA: Diagnosis not present

## 2021-09-27 DIAGNOSIS — G8918 Other acute postprocedural pain: Secondary | ICD-10-CM | POA: Diagnosis not present

## 2021-09-27 DIAGNOSIS — F319 Bipolar disorder, unspecified: Secondary | ICD-10-CM | POA: Diagnosis not present

## 2021-09-27 LAB — GLUCOSE, CAPILLARY
Glucose-Capillary: 153 mg/dL — ABNORMAL HIGH (ref 70–99)
Glucose-Capillary: 245 mg/dL — ABNORMAL HIGH (ref 70–99)
Glucose-Capillary: 141 mg/dL — ABNORMAL HIGH (ref 70–99)
Glucose-Capillary: 224 mg/dL — ABNORMAL HIGH (ref 70–99)

## 2021-09-27 LAB — RENAL FUNCTION PANEL
Albumin: 2.9 g/dL — ABNORMAL LOW (ref 3.5–5.0)
Anion gap: 10 (ref 5–15)
BUN: 10 mg/dL (ref 6–20)
CO2: 28 mmol/L (ref 22–32)
Calcium: 8.1 mg/dL — ABNORMAL LOW (ref 8.9–10.3)
Chloride: 98 mmol/L (ref 98–111)
Creatinine, Ser: 2.26 mg/dL — ABNORMAL HIGH (ref 0.44–1.00)
GFR, Estimated: 24 mL/min — ABNORMAL LOW (ref >60)
Glucose, Bld: 155 mg/dL — ABNORMAL HIGH (ref 70–99)
Phosphorus: 1.5 mg/dL — ABNORMAL LOW (ref 2.5–4.6)
Potassium: 3.7 mmol/L (ref 3.5–5.1)
Sodium: 136 mmol/L (ref 135–145)

## 2021-09-27 LAB — CBC
HCT: 27.6 % — ABNORMAL LOW (ref 36.0–46.0)
Hemoglobin: 8.9 g/dL — ABNORMAL LOW (ref 12.0–15.0)
MCH: 29.9 pg (ref 26.0–34.0)
MCHC: 32.2 g/dL (ref 30.0–36.0)
MCV: 92.6 fL (ref 80.0–100.0)
Platelets: 195 10*3/uL (ref 150–400)
RBC: 2.98 MIL/uL — ABNORMAL LOW (ref 3.87–5.11)
RDW: 15.1 % (ref 11.5–15.5)
WBC: 13.4 10*3/uL — ABNORMAL HIGH (ref 4.0–10.5)
nRBC: 0 % (ref 0.0–0.2)

## 2021-09-27 MED ORDER — ENOXAPARIN SODIUM 30 MG/0.3ML IJ SOSY
30.0000 mg | PREFILLED_SYRINGE | INTRAMUSCULAR | Status: DC
  Administered 2021-09-27 – 2021-10-10 (×14): 30 mg via SUBCUTANEOUS
  Filled 2021-09-27 (×14): qty 0.3

## 2021-09-27 MED ORDER — ENOXAPARIN SODIUM 100 MG/ML IJ SOSY: 1.0000 mg/kg | PREFILLED_SYRINGE | INTRAMUSCULAR | Status: DC

## 2021-09-27 MED ORDER — HEPARIN SODIUM (PORCINE) 1000 UNIT/ML DIALYSIS
40.0000 [IU]/kg | INTRAMUSCULAR | Status: DC | PRN
  Administered 2021-09-27: 3200 [IU] via INTRAVENOUS_CENTRAL
  Filled 2021-09-27: qty 4

## 2021-09-27 MED ORDER — PROSOURCE PLUS PO LIQD
30.0000 mL | Freq: Two times a day (BID) | ORAL | Status: DC
  Administered 2021-09-28 – 2021-09-29 (×4): 30 mL via ORAL
  Filled 2021-09-27 (×4): qty 30

## 2021-09-27 MED ORDER — OXYCODONE-ACETAMINOPHEN 5-325 MG PO TABS
1.0000 | ORAL_TABLET | Freq: Four times a day (QID) | ORAL | Status: DC | PRN
  Administered 2021-09-27: 1 via ORAL
  Filled 2021-09-27: qty 1

## 2021-09-27 MED ORDER — ENOXAPARIN SODIUM 40 MG/0.4ML IJ SOSY: 40.0000 mg | PREFILLED_SYRINGE | INTRAMUSCULAR | Status: DC

## 2021-09-27 NOTE — IPOC Note (Addendum)
Overall Plan of Care (IPOC) ?Patient Details ?Name: Jessica Mercado ?MRN: [id number] ?DOB: 1961-03-30 ? ?Admitting Diagnosis: Ankle fracture, bimalleolar, closed, left, sequela ? ?Hospital Problems: Principal Problem: ?  Ankle fracture, bimalleolar, closed, left, sequela ?Active Problems: ?  Pressure injury of skin ? ? ? ? Functional Problem List: ?Nursing Bowel, Bladder, Pain, Edema, Endurance, Medication Management, Safety  ?PT Balance, Behavior, Edema, Endurance, Motor, Nutrition, Pain, Perception, Safety, Sensory, Skin Integrity  ?OT Balance, Behavior, Safety, Endurance, Motor, Pain  ?SLP    ?TR    ?    ? Basic ADL?s: ?OT Bathing, Dressing, Toileting  ? ?  Advanced  ADL?s: ?OT    ?   ?Transfers: ?PT Bed Mobility, Bed to Chair, Car, Furniture  ?OT Toilet, Tub/Shower  ? ?  Locomotion: ?PT Ambulation, Wheelchair Mobility  ? ?  Additional Impairments: ?OT    ?SLP   ?  ?   ?TR    ? ? ?Anticipated Outcomes ?Item Anticipated Outcome  ?Self Feeding    ?Swallowing ?   ?  ?Basic self-care ? Modified I  ?Toileting ? Modified I ?  ?Bathroom Transfers Modified I  ?Bowel/Bladder ? manage bowel w mod I and bladder w toileting  ?Transfers ? spv  ?Locomotion ? spv wc level  ?Communication ?    ?Cognition ?    ?Pain ? pain at or below level 4 with prns  ?Safety/Judgment ? maintain safety w cues  ? ?Therapy Plan: ?PT Intensity: Minimum of 1-2 x/day ,45 to 90 minutes ?PT Frequency: 5 out of 7 days ?PT Duration Estimated Length of Stay: 12-14 days ?OT Intensity: Minimum of 1-2 x/day, 45 to 90 minutes ?OT Frequency: 5 out of 7 days ?OT Duration/Estimated Length of Stay: 12-14 days ?   ? ?Due to the current state of emergency, patients may not be receiving their 3-hours of Medicare-mandated therapy. ? ? Team Interventions: ?Nursing Interventions Patient/Family Education, Bowel Management, Pain Management, Discharge Planning, Medication Management, Disease Management/Prevention, Bladder Management, Skin Care/Wound Management  ?PT  interventions Ambulation/gait training, Discharge planning, Functional mobility training, Psychosocial support, Therapeutic Activities, Visual/perceptual remediation/compensation, Balance/vestibular training, Disease management/prevention, Neuromuscular re-education, Skin care/wound management, Therapeutic Exercise, Wheelchair propulsion/positioning, Cognitive remediation/compensation, DME/adaptive equipment instruction, Pain management, Splinting/orthotics, UE/LE Strength taining/ROM, Community reintegration, Functional electrical stimulation, Patient/family education, Stair training, UE/LE Coordination activities  ?OT Interventions Balance/vestibular training, Self Care/advanced ADL retraining, Therapeutic Activities, Pain management, Discharge planning, Functional mobility training, Patient/family education, Skin care/wound managment, Therapeutic Exercise, UE/LE Strength taining/ROM, DME/adaptive equipment instruction  ?SLP Interventions    ?TR Interventions    ?SW/CM Interventions Discharge Planning, Psychosocial Support, Patient/Family Education, Disease Management/Prevention  ? ?Barriers to Discharge ?MD  Medical stability  ?Nursing Decreased caregiver support, Wound Care, Hemodialysis ?1 level/level entry w SIL, new HD M-W-F  ?PT Decreased caregiver support, Home environment access/layout, Wound Care, Lack of/limited family support, Insurance for SNF coverage, Hemodialysis, Weight bearing restrictions, Behavior ?   ?OT Weight bearing restrictions ?compliance with WB restrictions  ?SLP   ?   ?SW Lack of/limited family support ?   ? ?Team Discharge Planning: ?Destination: PT-Home ,OT- Home , SLP-  ?Projected Follow-up: PT-Home health PT, 24 hour supervision/assistance, OT-  None, SLP-  ?Projected Equipment Needs: PT-To be determined, OT- To be determined, SLP-  ?Equipment Details: PT- , OT-  ?Patient/family involved in discharge planning: PT- Patient,  OT-Patient, SLP-  ? ?MD ELOS: 14-16 days ?Medical Rehab  Prognosis:  Excellent ?Assessment: The patient has been admitted for CIR therapies with the diagnosis of left  trimalleolar fracture. The team will be addressing functional mobility, strength, stamina, balance, safety, adaptive techniques and equipment, self-care, bowel and bladder mgt, patient and caregiver education. Goals have been set at supervision. Anticipated discharge destination is home. ? ? ?   ? ? ?See Team Conference Notes for weekly updates to the plan of care  ?

## 2021-09-27 NOTE — Progress Notes (Signed)
?                                                       PROGRESS NOTE ? ? ?Subjective/Complaints: ?Not voiding, discussed due to uremia, dialysis, fluid restriction. Will get PVR to check for retention. ?Denies pain ? ?ROS: denies pain, no longer voiding ? ? ?Objective: ?  ?No results found. ? ?Recent Labs  ?  09/25/21 ?0647 09/25/21 ?1736  ?WBC 12.4* 13.5*  ?HGB 9.1* 9.1*  ?HCT 27.3* 26.9*  ?PLT 228 235  ? ?Recent Labs  ?  09/24/21 ?1217 09/25/21 ?1736  ?NA 134* 135  ?K 3.9 3.4*  ?CL 99 99  ?CO2  --  26  ?GLUCOSE 168* 209*  ?BUN 21* 10  ?CREATININE 4.10* 2.31*  ?CALCIUM  --  8.5*  ? ? ?Intake/Output Summary (Last 24 hours) at 09/27/2021 0931 ?Last data filed at 09/27/2021 0745 ?Gross per 24 hour  ?Intake 480 ml  ?Output --  ?Net 480 ml  ?  ? ?Pressure Injury 09/25/21 Sacrum Medial;Upper Stage 2 -  Partial thickness loss of dermis presenting as a shallow open injury with a red, pink wound bed without slough. 1 mm x 26mm (Active)  ?09/25/21 2249  ?Location: Sacrum  ?Location Orientation: Medial;Upper  ?Staging: Stage 2 -  Partial thickness loss of dermis presenting as a shallow open injury with a red, pink wound bed without slough.  ?Wound Description (Comments): 1 mm x 64mm (Staged by DARA, RN)  ?Present on Admission: Yes  ? ? ?Physical Exam: ?Vital Signs ?Blood pressure 137/74, pulse 65, temperature 98.3 ?F (36.8 ?C), resp. rate 17, height 5\' 7"  (1.702 m), weight 93.6 kg, SpO2 96 %. ?Gen: no distress, normal appearing, BMI 32.32 ?HEENT: oral mucosa pink and moist, NCAT ?Cardio: Reg rate ?Chest: normal effort, normal rate of breathing ?Abd: soft, non-distended ?Ext: trace RLE edema, left forefoot has wound vac ?Psych: pleasant, normal affect ?Skin: right IJ TDC ?Neuro/MSK: Alert. Lef foot in ace bandage with wound vac. Impaired problem solving ? ?Assessment/Plan: ?1. Functional deficits which require 3+ hours per day of interdisciplinary therapy in a comprehensive inpatient rehab setting. ?Physiatrist is providing close  team supervision and 24 hour management of active medical problems listed below. ?Physiatrist and rehab team continue to assess barriers to discharge/monitor patient progress toward functional and medical goals ? ?Care Tool: ? ?Bathing ?   ?Body parts bathed by patient: Right arm, Left arm, Chest, Abdomen, Front perineal area, Right upper leg, Left upper leg  ? Body parts bathed by helper: Buttocks, Right lower leg ?  ?  ?Bathing assist Assist Level: Moderate Assistance - Patient 50 - 74% ?  ?  ?Upper Body Dressing/Undressing ?Upper body dressing   ?What is the patient wearing?: Pull over shirt ?   ?Upper body assist Assist Level: Set up assist ?   ?Lower Body Dressing/Undressing ?Lower body dressing ? ? ? Lower body dressing activity did not occur: Safety/medical concerns (Wound vac temp disconnected by nursing staff) ?What is the patient wearing?: Underwear/pull up, Pants ? ?  ? ?Lower body assist Assist for lower body dressing: Moderate Assistance - Patient 50 - 74% ?   ? ?Toileting ?Toileting    ?Toileting assist Assist for toileting: Maximal Assistance - Patient 25 - 49% ?  ?  ?Transfers ?Chair/bed transfer ? ?Transfers assist ?  Chair/bed transfer activity did not occur: Refused ? ?  ?  ?  ?Locomotion ?Ambulation ? ? ?Ambulation assist ? ? Ambulation activity did not occur: Refused ? ?  ?  ?   ? ?Walk 10 feet activity ? ? ?Assist ? Walk 10 feet activity did not occur: Refused ? ?  ?   ? ?Walk 50 feet activity ? ? ?Assist Walk 50 feet with 2 turns activity did not occur: Safety/medical concerns ? ?  ?   ? ? ?Walk 150 feet activity ? ? ?Assist Walk 150 feet activity did not occur: Safety/medical concerns ? ?  ?  ?  ? ?Walk 10 feet on uneven surface  ?activity ? ? ?Assist Walk 10 feet on uneven surfaces activity did not occur: Safety/medical concerns ? ? ?  ?   ? ?Wheelchair ? ? ? ? ?Assist Is the patient using a wheelchair?: Yes ?Type of Wheelchair: Manual ?Wheelchair activity did not occur: Refused ? ?  ?    ? ? ?Wheelchair 50 feet with 2 turns activity ? ? ? ?Assist ? ?  ?Wheelchair 50 feet with 2 turns activity did not occur: Refused ? ? ?   ? ?Wheelchair 150 feet activity  ? ? ? ?Assist ? Wheelchair 150 feet activity did not occur: Refused ? ? ?   ? ?Blood pressure 137/74, pulse 65, temperature 98.3 ?F (36.8 ?C), resp. rate 17, height 5\' 7"  (1.702 m), weight 93.6 kg, SpO2 96 %. ? ? ? Medical Problem List and Plan: ?1. Functional deficits secondary to left trimalleolar ankle fracture ?            -patient may not shower until wound vac removal. ?            -ELOS/Goals: 14-16 days modI ?            -Continue CIR ?2.  Impaired mobility: transition from heparin to Lovenox.  ?3. Postoperative pain: Tylenol BID. Decrease oxycodone to 5mg  6H prn.  ?4. Mood: LCSW to follow for evaluation and support.  ?            -antipsychotic agents: Latuda ?5. Neuropsych: This patient is capable of making decisions on her own behalf. ?6. Left lower extremity trimalleolar fracture: wound avc to be removed Wednesday 5/10 ?7. Obesity: BMI 32.32 provide dietary education. Restart Semaglutide. Discussed with patient ?9. 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 HTN: Monitor BP TID ?11. T2DM: Hgb A1C- 5.7 (down from 6.6  a couple of  months ago)--was on glucotrol and ozempic PTA ?            --Monitor BS ac/hs. Use SSI for elevated BS.  ? -restart Semaglutide.  ?12. Bipolar disorder: On Latuda, Trazodone and Prozac.  ?--Parkinsonism/EPS  managed with benztropine.  ?13. Anemia of chronic disease: continue to monitor Hgb with HD labs.  ?14. Screening for vitamin D deficiency: Vitamin D, 25 hydroxyl-: Level high normal @ 93.97. Does not need supplementation ?15. Hyponatremia: continue to monitor Na with HD labs ?16. Anuric: discussed relation to uremia, hemodialysis fluid restriction, will get PVR today to check for retention. ? ?LOS: ?2 days ?A FACE TO FACE EVALUATION WAS PERFORMED ? ?Martha Clan P Kiasha Bellin ?09/27/2021, 9:31 AM  ? ?  ?

## 2021-09-27 NOTE — Progress Notes (Signed)
Occupational Therapy Session Note ? ?Patient Details  ?Name: Jessica Mercado ?MRN: [id number] ?Date of Birth: 15-Dec-1960 ? ?Today's Date: 09/27/2021 ?OT Individual Time: 3536-1443 ?OT Individual Time Calculation (min): 40 min  and Today's Date: 09/27/2021 ?OT Missed Time: 20 Minutes ?Missed Time Reason: Patient fatigue ? ? ?Short Term Goals: ?Week 1:  OT Short Term Goal 1 (Week 1): Patient will be able to transfer to [facility] with mod a and cues ?OT Short Term Goal 2 (Week 1): Patient will  demonstrate ability to stand long enough to pull up/down LB clothing with min assist ?OT Short Term Goal 3 (Week 1): Patient will maintain LLE NWB status with functional mobility tasks ?OT Short Term Goal 4 (Week 1): Patient will tolerate upper body strengthening exercises to allow increased use of UE  on walker for functioanl mobility ? ?Skilled Therapeutic Interventions/Progress Updates:  ?  Pt received supine with no c/o pain, agreeable to OT session. She completed bed mobility with (S) to EOB. Mod A to stand from EOB with the RW. Placed Ot's foot under her LLE and she declined adhering to weightbearing precautions, placing full weight through Ot's foot despite OT cueing/education and attempting to terminate transfer but pt continues to stand pivot. She transferred to the toilet with min A using the grab bar. She had no urine void- MD entering and affirming this is normal given new HD. She required mod A for toileting tasks overall. Pt completed grooming tasks at the sink with set up assist. Mod A for hair care d/t tangling in the back. Pt completed another stand pivot transfer back to the EOB as she was now  c/o fatigue and requesting to rest before next session. She was left supine with all needs met, wound vac plugged in. Bed alarm set.  ? ? ?NO ADHERENCE TO WEIGHTBEARING PRECAUTIONS DESPITE COPIOUS CUES/EDUCATION. She reports she will "be just fine walking at home".  ? ?Therapy Documentation ?Precautions:  ?Precautions ?Precautions:  Fall ?Precaution Comments: wound vac L LE ?Required Braces or Orthoses: Splint/Cast ?Splint/Cast - Date Prophylactic Dressing Applied (if applicable): 15/40/08 ?Restrictions ?Weight Bearing Restrictions: Yes ?RLE Weight Bearing: Weight bearing as tolerated ?LLE Weight Bearing: Non weight bearing ? ?Therapy/Group: Individual Therapy ? ?Curtis Sites ?09/27/2021, 6:13 AM ?

## 2021-09-27 NOTE — Progress Notes (Signed)
Patient ID: Jessica Mercado, female   DOB: 01-Dec-1960, 61 y.o.   MRN: [id number] ? ? [facility] ?Progress Note  ? ?Assessment/ Plan:   ?1. Falls/syncope/left trimalleolar ankle fracture: The fall and syncope was likely related to worsening uremia and she is undergoing evaluation by orthopedic surgery for management of her trimalleolar ankle fracture.  Ongoing rehabilitation efforts in CIR. ?2. 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  On schedule for hemodialysis today that we will continue on a MWF schedule (which she will continue upon discharge at [facility]). ?3. Anemia: Likely secondary to anemia of chronic disease, continue ongoing management with ESA.  Status post intravenous iron. 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 ?5. Nutrition: Continue renal diet with fluid restriction and ongoing oral nutritional supplements. ?6. Hypertension: Blood pressures controlled with dialysis and low-dose prazosin.  She appears to be close to her dry weight. ? ?Subjective:   ?Denies any acute events overnight and reports that therapy sessions earlier today went well.  ? ?Objective:   ?BP 137/74 (BP Location: Left Arm)   Pulse 65   Temp 98.3 ?F (36.8 ?C)   Resp 17   Ht 5\' 7"  (1.702 m)   Wt 93.6 kg   LMP  (LMP Unknown)   SpO2 96%   BMI 32.32 kg/m?  ? ?Physical Exam: ?Gen: Comfortably resting in bed ?CVS: Pulse regular rhythm, normal rate, S1 and S2 normal ?Resp: Clear to auscultation bilaterally, no rales/rhonchi.  Right IJ TDC ?Abd: Soft, flat, nontender, bowel sounds normal ?Ext: Trace right lower extremity edema palpable, left forefoot with wound vac ? ?Labs: ?BMET ?Recent Labs  ?Lab 09/23/21 ?0414 09/24/21 ?1217 09/25/21 ?1736  ?NA 134* 134* 135  ?K 3.4* 3.9 3.4*  ?CL 99 99 99  ?CO2 25  --  26  ?GLUCOSE 137* 168* 209*  ?BUN 39* 21*  10  ?CREATININE 4.92* 4.10* 2.31*  ?CALCIUM 8.5*  --  8.5*  ?PHOS 4.1  --  2.1*  ? ?CBC ?Recent Labs  ?Lab 09/23/21 ?0414 09/24/21 ?1217 09/25/21 ?7408 09/25/21 ?1736  ?WBC 12.4*  --  12.4* 13.5*  ?HGB 8.5* 8.8* 9.1* 9.1*  ?HCT 26.1* 26.0* 27.3* 26.9*  ?MCV 89.1  --  89.8 90.0  ?PLT 258  --  228 235  ? ? ?  ?Medications:   ? ? acetaminophen  500 mg Oral Q12H  ? atorvastatin  40 mg Oral Q supper  ? benztropine  1 mg Oral QPM  ? Chlorhexidine Gluconate Cloth  6 each Topical BID  ? [START ON 09/30/2021] darbepoetin (ARANESP) injection - DIALYSIS  40 mcg Intravenous Q Mon-HD  ? enoxaparin (LOVENOX) injection  30 mg Subcutaneous Q24H  ? FLUoxetine  60 mg Oral q morning  ? gabapentin  100 mg Oral Daily  ? insulin aspart  0-5 Units Subcutaneous QHS  ? insulin aspart  0-6 Units Subcutaneous TID WC  ? lurasidone  40 mg Oral QHS  ? metoprolol succinate  50 mg Oral Daily  ? multivitamin  1 tablet Oral QHS  ? prazosin  1 mg Oral QPM  ? protein supplement  1 Scoop Oral TID WC  ? Semaglutide (1 MG/DOSE)  1.3 mg Subcutaneous Q Mon  ? traZODone  300 mg Oral QHS  ? ?Elmarie Shiley, MD ?09/27/2021, 12:42 PM  ? ?

## 2021-09-27 NOTE — Progress Notes (Signed)
Initial Nutrition Assessment ? ?DOCUMENTATION CODES:  ? ?Obesity unspecified ? ?INTERVENTION:  ?- Liberalize diet from a renal/carb modified to a carb modified diet to provide widest variety of menu options to enhance nutritional adequacy ? ?- Beneprotein 1 scoop TID, each supplement provides 25 kcals and 6 grams of protein ? ?- 30 ml ProSource Plus TID, each supplement provides 100 kcals and 15 grams protein.  ? ?- Renal MVI with minerals daily ? ?- Provided pt with "Nutrition for People on Dialysis" handout  ? ?NUTRITION DIAGNOSIS:  ? ?Increased nutrient needs related to catabolic illness as evidenced by estimated needs. ? ?GOAL:  ? ?Patient will meet greater than or equal to 90% of their needs ? ?MONITOR:  ? ?PO intake, Supplement acceptance, Diet advancement, Labs, Weight trends ? ?REASON FOR ASSESSMENT:  ? ?Consult ?Diet education ? ?ASSESSMENT:  ? ?Pt admitted to CIR from hospital d/t fuctional declines d/t fall with L ankle dislocation s/p ORIF. During admission, pt progressed to ESRD with falls d/t uremia and subsequently started on HD. PMH significant for T2DM with retinopathy, bipolar disorder, CHF, HTN with nephropathy, R ankle fracture and 2-3 months of recurrent falls. ? ?Pt reports that during admission she has been eating poorly d/t dislike of hospital food. Per review of chart, her PO intake for most meals is 25%. She has been drinking Beneprotein mixed with water. Beneprotein provides 25kcal and 6g protein per serving. We discussed the benefits of the addition of other nutrition supplements include Ensure, Nepro and ProSource while PO intake is minimal. She is agreeable to the addition of ProSource to improve nutritional intake of protein. She does not like Ensure and Boost as these are "chalky." Will continue Beneprotein until able to determine if pt is tolerating ProSource supplements.  ? ?Reviewed pt's labs. Given her sodium is WNL, potassium and phosphorus are low and she continues with  inadequate PO intake, pt would benefit from a liberalized diet to enhance nutritional adequacy.  ? ?Pt aware she will be followed by RD at dialysis center who will be monitoring her labs and making nutrition recommendations based on lab levels and her PO intake. Provided handout and spoke with pt about nutrition on dialysis. She states that she stopped drinking dark sodas. Discussed limiting foods with added phosphorus, limiting salt and eating enough protein at each meal. ? ?Reviewed weight history. Pt noted to have weight fluctuations within the last 3 months. She has a h/o CHF and ESRD now on HD which could be attributing to some fluctuations. Current weight noted to be 93.6 kg and plans for dialysis this afternoon. Will continue to monitor throughout admission. ? ?Medications: SSI 0-5 units daily, SSI 0-6 units TID, Rena-vit, semaglutide 1.3mg  every Monday, IV ferric gluconate 125mg  MWF ? ?Labs (05/03): sodium 135 (WNL), potassium 3.4, Cr 2.31, phosphorus 2.1, GFR 24 ? ?NUTRITION - FOCUSED PHYSICAL EXAM: ? ?Flowsheet Row Most Recent Value  ?Orbital Region No depletion  ?Upper Arm Region No depletion  ?Thoracic and Lumbar Region No depletion  ?Buccal Region No depletion  ?Temple Region No depletion  ?Clavicle Bone Region No depletion  ?Clavicle and Acromion Bone Region No depletion  ?Scapular Bone Region No depletion  ?Patellar Region No depletion  ?Anterior Thigh Region No depletion  ?Posterior Calf Region Unable to assess  [L ankle in cast]  ?Edema (RD Assessment) Mild  ?Hair Reviewed  ?Eyes Reviewed  ?Mouth Reviewed  ?Skin Reviewed  ?Nails Reviewed  ? ?  ? ?Diet Order:   ?Diet Order   ? ?       ?  Diet Carb Modified Fluid consistency: Thin; Room service appropriate? Yes; Fluid restriction: 1200 mL Fluid  Diet effective now       ?  ? ?  ?  ? ?  ? ?EDUCATION NEEDS:  ? ?Education needs have been addressed ? ?Skin:  Skin Assessment: Skin Integrity Issues: ?Skin Integrity Issues:: Stage II, Incisions ?Stage II:  sacrum ?Incisions: R leg, R neck ? ?Last BM:  5/3 (type 1) ? ?Height:  ? ?Ht Readings from Last 1 Encounters:  ?09/25/21 5\' 7"  (1.702 m)  ? ? ?Weight:  ? ?Wt Readings from Last 1 Encounters:  ?09/27/21 93.6 kg  ? ? ?Ideal Body Weight:  61.4 kg ? ?BMI:  Body mass index is 32.32 kg/m?. ? ?Estimated Nutritional Needs:  ? ?Kcal:    ? ?Protein:    ? ?Fluid:    ? ?Clayborne Dana, RDN, LDN ?Clinical Nutrition ?

## 2021-09-27 NOTE — Progress Notes (Signed)
Physical Therapy Session Note ? ?Patient Details  ?Name: Jessica Mercado ?MRN: [id number] ?Date of Birth: [birth year] ? ?Today's Date: 09/27/2021 ?PT Individual Time: 3754-3606 ?PT Individual Time Calculation (min): 55 min  ? ?Short Term Goals: ?Week 1:  PT Short Term Goal 1 (Week 1): Patient will adhere to weight bearing precautions with all mobility at least 50% of the time ?PT Short Term Goal 2 (Week 1): Patient will transfer bed <> wc with LRAD and ModA ?PT Short Term Goal 3 (Week 1): Patient will complete sit <> stand with LRAD and CGA while maintaining wbng precautions ?PT Short Term Goal 4 (Week 1): Patient will initiate wc mob ? ?Skilled Therapeutic Interventions/Progress Updates:  ? ?Pt received supine in bed and agreeable to PT. Supine>sit transfer without assist and use of rails to pull to long sitting.  ? ?Stand pivot transfer to [facility] with RW and min assist. Pt demonstrating at least 50% WB on the LLE despite cues fore NWB and reports that she is not putting any weight through LLE.  ? ?WC mobility 80f, 720fand 10088fith supervision assist-min Assist with cues for posture in chair and improved BUE ROM to increase use of momentum between pushes.  ? ?Seated BLE therex: LAQ, hip flexion, hip abduction, hip extension from flexed position. Each performed x 10 BLE with manual resistance to all but LAQ. Cues for hold at end range and full ROM on the LLE.  ? ?Sit<>stand with PT foot under LLE to limit WB through foot with poor carry over x 3 bouts. Pt stating that there is no weight on LLE, but noted to have at least partial WB throughout  ? ?Pt returned to room and performed stand pivot transfer to bed with Min assist and RW continues to WB through LLE, despite instructions. Sit>supine completed with supervision assist for management of wound vac line. Pt  left supine in bed with call bell in reach and all needs met.  ? ?   ? ?Therapy Documentation ?Precautions:  ?Precautions ?Precautions: Fall ?Precaution Comments:  wound vac L LE ?Required Braces or Orthoses: Splint/Cast ?Splint/Cast - Date Prophylactic Dressing Applied (if applicable): 04/77/03/40estrictions ?Weight Bearing Restrictions: Yes ?RLE Weight Bearing: Weight bearing as tolerated ?LLE Weight Bearing: Non weight bearing ? ?  ?Pain: ? denies ? ? ?Therapy/Group: Individual Therapy ? ?AusLorie Phenix/09/2021, 2:02 PM  ?

## 2021-09-27 NOTE — Progress Notes (Signed)
Physical Therapy Session Note ? ?Patient Details  ?Name: Jessica Mercado ?MRN: [id number] ?Date of Birth: 02/14/1961 ? ?Today's Date: 09/27/2021 ?PT Individual Time: 4497-5300 ?PT Individual Time Calculation (min): 53 min  ? ?Short Term Goals: ?Week 1:  PT Short Term Goal 1 (Week 1): Patient will adhere to weight bearing precautions with all mobility at least 50% of the time ?PT Short Term Goal 2 (Week 1): Patient will transfer bed <> wc with LRAD and ModA ?PT Short Term Goal 3 (Week 1): Patient will complete sit <> stand with LRAD and CGA while maintaining wbng precautions ?PT Short Term Goal 4 (Week 1): Patient will initiate wc mob ? ?Skilled Therapeutic Interventions/Progress Updates:  ? Received pt sitting upright in bed. Upon entering pt stated "again? I just had therapy" - explained 3 hour rule and importance of participating in all of therapy while building trust/rapport with pt - pt denied any pain during session. Session with emphasis on functional mobility/transfers, generalized strengthening and endurance, and dynamic standing balance/coordination. Pt transferred long sitting<>sitting EOB with HOB elevated and use of bedrails with supervision and transferred bed<>WC stand<>pivot with RW and min A (heavy min A to stand) - pt with VERY poor adherence to LLE NWB precautions and actually appeared to be placing full weight on LLE. When therapist tried to cue pt, she became frustrated stating "I've been telling you guys, I can't do it"! Pt then performed WC mobility 35ft using BUE and supervision but transported remainder of way to dayroom dependently due to UE fatigue. Pt performed seated RLE strengthening on Kinetron at 30 cm/sec for 1 minute x 4 trials with therapist providing manual counter resistance with emphasis on glute/quad strength. Pt then performed the following UE exercises with supervision and verbal cues for technique: ?-bicep curls with 5.5lb dowel 2x8 ?-horizontal chest press at 90 degrees with 3lb  dowel 2x10 ?-overhead shoulder press with 3lb dowel 2x10 ?Pt required multiple rest breaks due to fatigue and requested to return to bed at end of session. WC<>bed stand<>pivot with RW and min A - again with continued poor adherence to LLE NWB precautions. Sit<>semi-reclined with supervision. Concluded session with pt semi-reclined in bed, needs within reach, and bed alarm on.  ? ?Therapy Documentation ?Precautions:  ?Precautions ?Precautions: Fall ?Precaution Comments: wound vac L LE ?Required Braces or Orthoses: Splint/Cast ?Splint/Cast - Date Prophylactic Dressing Applied (if applicable): 51/10/21 ?Restrictions ?Weight Bearing Restrictions: Yes ?RLE Weight Bearing: Weight bearing as tolerated ?LLE Weight Bearing: Non weight bearing ? ?Therapy/Group: Individual Therapy ?Blenda Nicely ?Becky Sax PT, DPT  ?09/27/2021, 7:27 AM  ?

## 2021-09-27 NOTE — Progress Notes (Signed)
?   09/27/21 1450  ?Clinical Encounter Type  ?Visited With Patient not available  ?Visit Type Initial;Other (Comment) ?(Advanced Directive)  ?Referral From Nurse  ?Consult/Referral To Chaplain  ? ?Chaplain responded to a spiritual consult for advanced directive education.  ?Patient was not in the room at the time of visit.  ? ?Danice Goltz  ?Chaplain  ?[facility] ?[phone] ?

## 2021-09-28 DIAGNOSIS — Z992 Dependence on renal dialysis: Secondary | ICD-10-CM

## 2021-09-28 DIAGNOSIS — N186 End stage renal disease: Secondary | ICD-10-CM

## 2021-09-28 DIAGNOSIS — E669 Obesity, unspecified: Secondary | ICD-10-CM

## 2021-09-28 DIAGNOSIS — E1169 Type 2 diabetes mellitus with other specified complication: Secondary | ICD-10-CM

## 2021-09-28 LAB — GLUCOSE, CAPILLARY
Glucose-Capillary: 171 mg/dL — ABNORMAL HIGH (ref 70–99)
Glucose-Capillary: 219 mg/dL — ABNORMAL HIGH (ref 70–99)

## 2021-09-28 NOTE — Progress Notes (Signed)
Physical Therapy Session Note ? ?Patient Details  ?Name: Jessica Mercado ?MRN: [id number] ?Date of Birth: [birth year] ? ?Today's Date: 09/28/2021 ?PT Individual Time: 0277-4128 ?PT Individual Time Calculation (min): 71 min  ? ?Short Term Goals: ?Week 1:  PT Short Term Goal 1 (Week 1): Patient will adhere to weight bearing precautions with all mobility at least 50% of the time ?PT Short Term Goal 2 (Week 1): Patient will transfer bed <> wc with LRAD and ModA ?PT Short Term Goal 3 (Week 1): Patient will complete sit <> stand with LRAD and CGA while maintaining wbng precautions ?PT Short Term Goal 4 (Week 1): Patient will initiate wc mob ? ?Skilled Therapeutic Interventions/Progress Updates:  ?Pt in bed on arrival to room. Agreed to PT at this time. No pain.  ? ?BED MOBILITY: ?Supervision with increased time to come up into long sitting and then bring LE's over off edge of bed. Pt then able to scoot self closer to edge of bed for feet to touch floor. Mod assist to don pants over bil LE's with pt then able to pull them up from mid calf to thighs.  ? ?TRANSFERS: ?Sit<>stand x 1 rep elevated bed<>RW with mod assist, PTA foot under pt's left LE to assist with NWB status. In standing at RW min assist for balance with total assist to pull pants up remainder of the way with continued need of PTA foot to maintain NWB on left LE.  ?Lateral scoot transfers: bed<>wheelchair x 1 rep each way, then mat table<>wheelchair x 1 rep each way. Cues needed on technique and weight shifting with PTA holding left LE between her legs to ensure NWB with the transfers with min assist each time.  ? ?WHEELCHAIR MOBILITY ?Pt initiated self propulsion from room out into hallway till the exit sign with several rest breaks needed, max distance of 5 feet before needed to rest UE's. PTA transitioned pt the rest of the way to the elevators and outside to the common area off 1st floor near [facility] and [facility] entrance for energy conservation and time  management. Once in the courtyard had pt resume propulsion of wheelchair around courtyard down around water fountain an back towards the entrance. Pt continued to need frequent rest breaks, every 5-8 feet with max distance of ~10 feet with a down hill area. Occasionally needed min assist due to getting caught on uneven paved surfaces or for directional assistance. Once back at the entrance PTA transported pt back to elevator and to rehab gym on 4th floor.  ? ?STRENGTHENING:  ?Supine on mat table working on left LE strengthening: short arc quads with leg over bolster, straight leg raises and hip abd/add for 2 sets of 10 reps active to active assist.  ? ?Pt left back in bed at end of session with all needs in reach and bed alarm set.  ? ?Therapy Documentation ?Precautions:  ?Precautions ?Precautions: Fall ?Precaution Comments: wound vac L LE ?Required Braces or Orthoses: Splint/Cast ?Splint/Cast - Date Prophylactic Dressing Applied (if applicable): 78/67/67 ?Restrictions ?Weight Bearing Restrictions: Yes ?RLE Weight Bearing: Weight bearing as tolerated ?LLE Weight Bearing: Non weight bearing ?General: ?  ?Vital Signs: ?Therapy Vitals ?Pulse Rate: 81 ?BP: 120/62 ?Pain: ?Pain Assessment ?Pain Scale: 0-10 ?Pain Score: 0-No pain ? ? ? ?Therapy/Group: Individual Therapy ? ?Willow Ora, PTA, CLT ?[place] ?09/28/21, 12:32 PM   ?

## 2021-09-28 NOTE — Progress Notes (Signed)
Occupational Therapy Session Note ? ?Patient Details  ?Name: Jessica Mercado ?MRN: [id number] ?Date of Birth: [birth year] ? ?Today's Date: 09/28/2021 ?OT Individual Time: 2202-5427(CW session) and 1356-1450 (PM session) ?OT Individual Time Calculation (min): 43 min  and 54 min and Today's Date: 09/28/2021 ?OT Missed Time: 17 Minutes (AM session) ?Missed Time Reason: Patient fatigue ? ? ?Short Term Goals: ?Week 1:  OT Short Term Goal 1 (Week 1): Patient will be able to transfer to [facility] with mod a and cues ?OT Short Term Goal 2 (Week 1): Patient will  demonstrate ability to stand long enough to pull up/down LB clothing with min assist ?OT Short Term Goal 3 (Week 1): Patient will maintain LLE NWB status with functional mobility tasks ?OT Short Term Goal 4 (Week 1): Patient will tolerate upper body strengthening exercises to allow increased use of UE  on walker for functioanl mobility ? ? ?Skilled Therapeutic Interventions/Progress Updates:  ?  Session 1: Pt greeted at time of scheduled OT session in the AM bed level sleeping, pt keeping eyes closed and very limited response with verbal/tactile cues and pt not wanting to wake up at this time. Returned approx 15 minutes later per request and pt bed level, just finishing up with MD making rounds. Pt agreeable to participate in OT session saying "Ill do what you guys want me to do" but stating she is very tired multiple times during session. Educated on importance of OT and getting OOB/increasing activity for recovery with limited carryover. Supine > sit MOD A and max encouragement to attempt and sequence legs over edge, sitting EOB educated on purpose of NWB status and importance of adherence. Set up wheelchair for transfer and pt showing limited initiation and decreased desire to get up in chair. Performed grooming/hygiene seated at EOB. Sit <> stands x3 attempts with MOD A with OT holding LLE off ground and cues for hand placement, difficulty performing with adhering to NWB  status. Pt scooting toward HOB with MIN A and sit > supine CGA. Max encouragement needed this session, declined other ADL tasks. Bed level resting alarm on call bell in reach.  ? ?Session 2: Pt greeted at time of session bed level resting agreeable to OT session with encouragement. No pain reported. Supine > sit CGA and squat/scoot pivot bed > wheelchair with MIN A and cues for NWB status. Self propel approx 40-50 feet in hall with frequent rest breaks and max encouragement to continue as pt requested to be pushed. Transported remaining distance to gym and pt set up on SCIFIT on level 3 for 5  minutes, needing a break every 45 sec/1 min intervals with encouragement to keep going. Switched to UB there ex with 3# ball for overhead press but pt stating this is too difficulty 2/2 neuropathy and switched to 2# bar for 2x10 chest press, overhead press, and FWD circles. Discussion with the pt regarding home set up, DME, and supports with pt stating she already has [facility] and son in law with assist with ADLs. Transported back to room with pt reporting fatigue and nursing checking vitals. Pt performing squat/scoot transfer to bed with MIN A and set up with nursing staff. ? ?Therapy Documentation ?Precautions:  ?Precautions ?Precautions: Fall ?Precaution Comments: wound vac L LE ?Required Braces or Orthoses: Splint/Cast ?Splint/Cast - Date Prophylactic Dressing Applied (if applicable): 23/76/28 ?Restrictions ?Weight Bearing Restrictions: Yes ?RLE Weight Bearing: Weight bearing as tolerated ?LLE Weight Bearing: Non weight bearing ? ? ? ?Therapy/Group: Individual Therapy ? ?Betti Cruz  Nol[place] Denis ?09/28/2021, 7:12 AM ?

## 2021-09-28 NOTE — Progress Notes (Signed)
No complications noted during this shift. Handoff given to night nurse.  ? 09/28/21 1927  ?Vitals  ?Temp 98.3 ?F (36.8 ?C)  ?BP (!) 107/57  ?MAP (mmHg) 73  ?BP Location Right Arm  ?BP Method Automatic  ?Patient Position (if appropriate) Lying  ?Pulse Rate 77  ?Pulse Rate Source Monitor  ?Resp 18  ?MEWS COLOR  ?MEWS Score Color Green  ?Oxygen Therapy  ?SpO2 97 %  ?O2 Device Room Air  ?MEWS Score  ?MEWS Temp 0  ?MEWS Systolic 0  ?MEWS Pulse 0  ?MEWS RR 0  ?MEWS LOC 0  ?MEWS Score 0  ? ? ?

## 2021-09-28 NOTE — Progress Notes (Signed)
Incorrect medication given to patient. MD Naaman Plummer notified, patient notified. Vitals obtained. No complications noted at this time. BP soft. Fluids encouraged. Will continue to monitor. ?Sheela Stack, LPN  ?

## 2021-09-28 NOTE — Progress Notes (Signed)
Pt resting comfortably with eyes closed. No complications noted at this time.  ? 09/28/21 1631  ?Vitals  ?Temp 98.2 ?F (36.8 ?C)  ?Temp Source Oral  ?BP 135/75  ?MAP (mmHg) 92  ?BP Location Left Arm  ?BP Method Automatic  ?Patient Position (if appropriate) Lying  ?Pulse Rate 73  ?Pulse Rate Source Monitor  ?Resp 18  ?MEWS COLOR  ?MEWS Score Color Green  ?Oxygen Therapy  ?SpO2 94 %  ?O2 Device Room Air  ?MEWS Score  ?MEWS Temp 0  ?MEWS Systolic 0  ?MEWS Pulse 0  ?MEWS RR 0  ?MEWS LOC 0  ?MEWS Score 0  ? ? ?

## 2021-09-28 NOTE — Progress Notes (Signed)
?                                                       PROGRESS NOTE ? ? ?Subjective/Complaints: ?Patient indicates that she was restless last night.  A bit anxious at times. ? ?ROS: Patient denies fever, rash, sore throat, blurred vision, dizziness, nausea, vomiting, diarrhea, cough, shortness of breath or chest pain,  back/neck pain, headache, or mood change.  ? ?Objective: ?  ?No results found. ? ?Recent Labs  ?  09/25/21 ?1736 09/27/21 ?1621  ?WBC 13.5* 13.4*  ?HGB 9.1* 8.9*  ?HCT 26.9* 27.6*  ?PLT 235 195  ? ?Recent Labs  ?  09/25/21 ?1736 09/27/21 ?9211  ?NA 135 136  ?K 3.4* 3.7  ?CL 99 98  ?CO2 26 28  ?GLUCOSE 209* 155*  ?BUN 10 10  ?CREATININE 2.31* 2.26*  ?CALCIUM 8.5* 8.1*  ? ? ?Intake/Output Summary (Last 24 hours) at 09/28/2021 1104 ?Last data filed at 09/28/2021 0900 ?Gross per 24 hour  ?Intake 445.74 ml  ?Output 1500 ml  ?Net -1054.26 ml  ?  ? ?Pressure Injury 09/25/21 Sacrum Medial;Upper Stage 2 -  Partial thickness loss of dermis presenting as a shallow open injury with a red, pink wound bed without slough. 1 mm x 37mm (Active)  ?09/25/21 2249  ?Location: Sacrum  ?Location Orientation: Medial;Upper  ?Staging: Stage 2 -  Partial thickness loss of dermis presenting as a shallow open injury with a red, pink wound bed without slough.  ?Wound Description (Comments): 1 mm x 29mm (Staged by DARA, RN)  ?Present on Admission: Yes  ? ? ?Physical Exam: ?Vital Signs ?Blood pressure 120/62, pulse 81, temperature 98.2 ?F (36.8 ?C), temperature source Oral, resp. rate 17, height 5\' 7"  (1.702 m), weight 92.2 kg, SpO2 96 %. ?Constitutional: No distress . Vital signs reviewed. ?HEENT: NCAT, EOMI, oral membranes moist ?Neck: supple ?Cardiovascular: RRR without murmur. No JVD    ?Respiratory/Chest: CTA Bilaterally without wheezes or rales. Normal effort    ?GI/Abdomen: BS +, non-tender, non-distended ?Ext: no clubbing, cyanosis,  a ?Psych: flat but cooperative  ?Skin: right IJ TDC, left foot with vac ?Neuro/MSK: Alert. Lef  foot in ace bandage with wound vac. Impaired problem solving, delayed in general.  ? ?Assessment/Plan: ?1. Functional deficits which require 3+ hours per day of interdisciplinary therapy in a comprehensive inpatient rehab setting. ?Physiatrist is providing close team supervision and 24 hour management of active medical problems listed below. ?Physiatrist and rehab team continue to assess barriers to discharge/monitor patient progress toward functional and medical goals ? ?Care Tool: ? ?Bathing ?   ?Body parts bathed by patient: Right arm, Left arm, Chest, Abdomen, Front perineal area, Right upper leg, Left upper leg  ? Body parts bathed by helper: Buttocks, Right lower leg ?  ?  ?Bathing assist Assist Level: Moderate Assistance - Patient 50 - 74% ?  ?  ?Upper Body Dressing/Undressing ?Upper body dressing   ?What is the patient wearing?: Pull over shirt ?   ?Upper body assist Assist Level: Set up assist ?   ?Lower Body Dressing/Undressing ?Lower body dressing ? ? ? Lower body dressing activity did not occur: Safety/medical concerns (Wound vac temp disconnected by nursing staff) ?What is the patient wearing?: Underwear/pull up, Pants ? ?  ? ?Lower body assist Assist for lower body dressing: Moderate  Assistance - Patient 50 - 74% ?   ? ?Toileting ?Toileting    ?Toileting assist Assist for toileting: Maximal Assistance - Patient 25 - 49% ?  ?  ?Transfers ?Chair/bed transfer ? ?Transfers assist ? Chair/bed transfer activity did not occur: Refused ? ?Chair/bed transfer assist level: Minimal Assistance - Patient > 75% ?  ?  ?Locomotion ?Ambulation ? ? ?Ambulation assist ? ? Ambulation activity did not occur: Refused ? ?  ?  ?   ? ?Walk 10 feet activity ? ? ?Assist ? Walk 10 feet activity did not occur: Refused ? ?  ?   ? ?Walk 50 feet activity ? ? ?Assist Walk 50 feet with 2 turns activity did not occur: Safety/medical concerns ? ?  ?   ? ? ?Walk 150 feet activity ? ? ?Assist Walk 150 feet activity did not occur:  Safety/medical concerns ? ?  ?  ?  ? ?Walk 10 feet on uneven surface  ?activity ? ? ?Assist Walk 10 feet on uneven surfaces activity did not occur: Safety/medical concerns ? ? ?  ?   ? ?Wheelchair ? ? ? ? ?Assist Is the patient using a wheelchair?: Yes ?Type of Wheelchair: Manual ?Wheelchair activity did not occur: Refused ? ?Wheelchair assist level: Supervision/Verbal cueing ?Max wheelchair distance: 74ft  ? ? ?Wheelchair 50 feet with 2 turns activity ? ? ? ?Assist ? ?  ?Wheelchair 50 feet with 2 turns activity did not occur: Refused ? ? ?   ? ?Wheelchair 150 feet activity  ? ? ? ?Assist ? Wheelchair 150 feet activity did not occur: Refused ? ? ?   ? ?Blood pressure 120/62, pulse 81, temperature 98.2 ?F (36.8 ?C), temperature source Oral, resp. rate 17, height 5\' 7"  (1.702 m), weight 92.2 kg, SpO2 96 %. ? ? ? Medical Problem List and Plan: ?1. Functional deficits secondary to left trimalleolar ankle fracture ?            -patient may not shower until wound vac removal. ?            -ELOS/Goals: 14-16 days modI ?           -Continue CIR therapies including PT, OT  ?2.  Impaired mobility: transition from heparin to Lovenox.  ?3. Postoperative pain: Tylenol BID. Decrease oxycodone to 5mg  6H prn.  ?4. Mood/sleep: LCSW to follow for evaluation and support.  ?            -antipsychotic agents: Latuda ? -pt also on high dose trazodone at bedtime ?5. Neuropsych: This patient is capable of making decisions on her own behalf. ?6. Left lower extremity trimalleolar fracture: wound avc to be removed Wednesday 5/10 ?7. Obesity: BMI 32.32 provide dietary education. Restart Semaglutide. 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 HTN: Monitor BP TID ?11. T2DM: Hgb A1C- 5.7 (down from 6.6  a couple of  months ago)--was on glucotrol and ozempic PTA ?            --CBG (last 3)  ?Recent Labs  ?  09/27/21 ?1825 09/27/21 ?2053 09/28/21 ?7893  ?GLUCAP [place] with ssi ? -restart  Semaglutide.--first dose 09/30/21  ?12. Bipolar disorder: On Latuda, Trazodone and Prozac.  ?--Parkinsonism/EPS  managed with benztropine.  ?-might benefit from neuro-psych eval ?13. Anemia of chronic disease: continue to monitor Hgb with HD labs.  ?14. Screening for vitamin D deficiency: Vitamin D, 25 hydroxyl-: Level high normal @ 93.97. Does not need  supplementation ?15. Hyponatremia: continue to monitor Na with HD labs ?16. Anuric: discussed relation to uremia, hemodialysis fluid restriction ? -no pvr's, making intermittent urine ?LOS: ?3 days ?A FACE TO FACE EVALUATION WAS PERFORMED ? ?Meredith Staggers ?09/28/2021, 11:04 AM  ? ?  ?

## 2021-09-28 NOTE — Progress Notes (Signed)
Voicemail left for bill regarding pt semaglutide.  ?Sheela Stack, LPN  ?

## 2021-09-29 LAB — GLUCOSE, CAPILLARY
Glucose-Capillary: 191 mg/dL — ABNORMAL HIGH (ref 70–99)
Glucose-Capillary: 191 mg/dL — ABNORMAL HIGH (ref 70–99)
Glucose-Capillary: 197 mg/dL — ABNORMAL HIGH (ref 70–99)

## 2021-09-29 MED ORDER — CHLORHEXIDINE GLUCONATE CLOTH 2 % EX PADS
6.0000 | MEDICATED_PAD | Freq: Two times a day (BID) | CUTANEOUS | Status: DC
  Administered 2021-09-29 – 2021-10-06 (×14): 6 via TOPICAL

## 2021-09-29 NOTE — Progress Notes (Addendum)
?                                                       PROGRESS NOTE ? ? ?Subjective/Complaints: ?Patient up in bed. Doesn't like food, won't eat it. Accidentally received 2mg  requip yesterday. No ill effects ? ?ROS: Patient denies fever, rash, sore throat, blurred vision, dizziness, nausea, vomiting, diarrhea, cough, shortness of breath or chest pain, joint or back/neck pain, headache, or mood change.  ? ?Objective: ?  ?No results found. ? ?Recent Labs  ?  09/27/21 ?1621  ?WBC 13.4*  ?HGB 8.9*  ?HCT 27.6*  ?PLT 195  ? ?Recent Labs  ?  09/27/21 ?6237  ?NA 136  ?K 3.7  ?CL 98  ?CO2 28  ?GLUCOSE 155*  ?BUN 10  ?CREATININE 2.26*  ?CALCIUM 8.1*  ? ? ?Intake/Output Summary (Last 24 hours) at 09/29/2021 1028 ?Last data filed at 09/29/2021 0901 ?Gross per 24 hour  ?Intake 436 ml  ?Output 0 ml  ?Net 436 ml  ?  ? ?Pressure Injury 09/25/21 Sacrum Medial;Upper Stage 2 -  Partial thickness loss of dermis presenting as a shallow open injury with a red, pink wound bed without slough. 1 mm x 10mm (Active)  ?09/25/21 2249  ?Location: Sacrum  ?Location Orientation: Medial;Upper  ?Staging: Stage 2 -  Partial thickness loss of dermis presenting as a shallow open injury with a red, pink wound bed without slough.  ?Wound Description (Comments): 1 mm x 57mm (Staged by DARA, RN)  ?Present on Admission: Yes  ? ? ?Physical Exam: ?Vital Signs ?Blood pressure 114/69, pulse 75, temperature 98.1 ?F (36.7 ?C), temperature source Oral, resp. rate 18, height 5\' 7"  (1.702 m), weight 89.6 kg, SpO2 96 %. ?Constitutional: No distress . Vital signs reviewed. ?HEENT: NCAT, EOMI, oral membranes moist ?Neck: supple ?Cardiovascular: RRR without murmur. No JVD    ?Respiratory/Chest: CTA Bilaterally without wheezes or rales. Normal effort    ?GI/Abdomen: BS +, non-tender, non-distended ?Ext: no clubbing, cyanosis, or edema ?Psych: flat but cooperative  ?Skin: right IJ TDC, left foot with vac, sacral wound ?Neuro/MSK: Alert. Lef foot in ace bandage with wound  vac. Impaired problem solving, delayed in general.  ? ?Assessment/Plan: ?1. Functional deficits which require 3+ hours per day of interdisciplinary therapy in a comprehensive inpatient rehab setting. ?Physiatrist is providing close team supervision and 24 hour management of active medical problems listed below. ?Physiatrist and rehab team continue to assess barriers to discharge/monitor patient progress toward functional and medical goals ? ?Care Tool: ? ?Bathing ?   ?Body parts bathed by patient: Right arm, Left arm, Chest, Abdomen, Front perineal area, Right upper leg, Left upper leg  ? Body parts bathed by helper: Buttocks, Right lower leg ?  ?  ?Bathing assist Assist Level: Moderate Assistance - Patient 50 - 74% ?  ?  ?Upper Body Dressing/Undressing ?Upper body dressing   ?What is the patient wearing?: Pull over shirt ?   ?Upper body assist Assist Level: Set up assist ?   ?Lower Body Dressing/Undressing ?Lower body dressing ? ? ? Lower body dressing activity did not occur: Safety/medical concerns (Wound vac temp disconnected by nursing staff) ?What is the patient wearing?: Underwear/pull up, Pants ? ?  ? ?Lower body assist Assist for lower body dressing: Moderate Assistance - Patient 50 - 74% ?   ? ?  Toileting ?Toileting    ?Toileting assist Assist for toileting: Maximal Assistance - Patient 25 - 49% ?  ?  ?Transfers ?Chair/bed transfer ? ?Transfers assist ? Chair/bed transfer activity did not occur: Refused ? ?Chair/bed transfer assist level: Minimal Assistance - Patient > 75% ?  ?  ?Locomotion ?Ambulation ? ? ?Ambulation assist ? ? Ambulation activity did not occur: Refused ? ?  ?  ?   ? ?Walk 10 feet activity ? ? ?Assist ? Walk 10 feet activity did not occur: Refused ? ?  ?   ? ?Walk 50 feet activity ? ? ?Assist Walk 50 feet with 2 turns activity did not occur: Safety/medical concerns ? ?  ?   ? ? ?Walk 150 feet activity ? ? ?Assist Walk 150 feet activity did not occur: Safety/medical concerns ? ?  ?  ?   ? ?Walk 10 feet on uneven surface  ?activity ? ? ?Assist Walk 10 feet on uneven surfaces activity did not occur: Safety/medical concerns ? ? ?  ?   ? ?Wheelchair ? ? ? ? ?Assist Is the patient using a wheelchair?: Yes ?Type of Wheelchair: Manual ?Wheelchair activity did not occur: Refused ? ?Wheelchair assist level: Supervision/Verbal cueing ?Max wheelchair distance: 25 09/27/21 indoors, 10 feet 09/28/21 outdoor paved surfaces  ? ? ?Wheelchair 50 feet with 2 turns activity ? ? ? ?Assist ? ?  ?Wheelchair 50 feet with 2 turns activity did not occur: Refused ? ? ?   ? ?Wheelchair 150 feet activity  ? ? ? ?Assist ? Wheelchair 150 feet activity did not occur: Refused ? ? ?   ? ?Blood pressure 114/69, pulse 75, temperature 98.1 ?F (36.7 ?C), temperature source Oral, resp. rate 18, height 5\' 7"  (1.702 m), weight 89.6 kg, SpO2 96 %. ? ? ? Medical Problem List and Plan: ?1. Functional deficits secondary to left trimalleolar ankle fracture ?            -patient may not shower until wound vac removal. ?            -ELOS/Goals: 14-16 days mod I ?           -Continue CIR therapies including PT, OT  ? -erroneously received requip yesterday. Pt had no issues whatsoever. Pt aware of the  mistake.  ?2.  Impaired mobility:  Lovenox.  ?3. Postoperative pain: Tylenol BID. Decrease oxycodone to 5mg  6H prn.  ?4. Mood/sleep: team providing ego support.  ?            -antipsychotic agents: Latuda ? -pt also on high dose trazodone at bedtime ?5. Neuropsych: This patient is capable of making decisions on her own behalf. ?6. Left lower extremity trimalleolar fracture: wound avc to be removed Wednesday 5/10 ?7. Obesity: BMI 32.32 provide dietary education. Restart Semaglutide. 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 HTN: Monitor BP TID ?11. T2DM: Hgb A1C- 5.7 (down from 6.6  a couple of  months ago)--was on glucotrol and ozempic PTA ?            --CBG (last 3)  ?Recent Labs  ?  09/28/21 ?0608 09/28/21 ?1211  09/29/21 ?0607  ?GLUCAP 171* 219* 191*  ?   -  ssi ? 5/7-restart Semaglutide.--first dose 09/30/21 from home ? -cover with ssi for now ?12. Bipolar disorder: On Latuda, Trazodone and Prozac.  ?--Parkinsonism/EPS  managed with benztropine.  ?-might benefit from neuro-psych eval ?13. Anemia of chronic disease: continue to monitor Hgb with HD labs.  ?14.  Screening for vitamin D deficiency: Vitamin D, 25 hydroxyl-: Level high normal @ 93.97. Does not need supplementation ?15. Hyponatremia: continue to monitor Na with HD labs ?16. Anuric: discussed relation to uremia, hemodialysis fluid restriction ? -no pvr's, making intermittent urine only ?17. FEN: poor po intake ? 5/7 -reached out nephro. [place] for reg CM diet ? ? ?LOS: ?4 days ?A FACE TO FACE EVALUATION WAS PERFORMED ? ?Meredith Staggers ?09/29/2021, 10:28 AM  ? ?  ?

## 2021-09-29 NOTE — Progress Notes (Signed)
Reached out to bill, pt contact to bring semaglutide. Pt contact states he cannot bring in semaglutide today (5/7) due to transportation issues. Can bring tomorrow. Pharmacy notified ?Sheela Stack, LPN  ?

## 2021-09-29 NOTE — Progress Notes (Signed)
Patient ID: Jessica Mercado, female   DOB: 1961-05-08, 61 y.o.   MRN: [id number] ? ?[facility] ?Progress Note  ? ?Assessment/ Plan:   ?1. Falls/syncope/left trimalleolar ankle fracture: The fall and syncope was likely related to worsening uremia and she is undergoing evaluation by orthopedic surgery for management of her trimalleolar ankle fracture.  Admitted to inpatient rehabilitation for continued strengthening to facilitate discharge home. ?2. 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  I have put in orders for hemodialysis tomorrow that we will continue on a MWF schedule (which she will continue upon discharge at [facility]). ?3. Anemia: Likely secondary to anemia of chronic disease, continue ongoing management with ESA.  Status post intravenous iron. 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 ?5. Nutrition: Continue renal diet with fluid restriction and ongoing oral nutritional supplements. ?6. Hypertension: Blood pressures controlled with dialysis and low-dose prazosin.  Appears to be at her dry weight. ? ?Subjective:   ?Without any acute overnight events, reports that she is breathing well and denies any chest pain.  ? ?Objective:   ?BP 134/69 (BP Location: Right Arm)   Pulse 71   Temp 98.1 ?F (36.7 ?C) (Oral)   Resp 18   Ht 5\' 7"  (1.702 m)   Wt 89.6 kg   LMP  (LMP Unknown)   SpO2 96%   BMI 30.94 kg/m?  ? ?Physical Exam: ?Gen: Comfortably resting in bed ?CVS: Pulse regular rhythm, normal rate, S1 and S2 normal ?Resp: Clear to auscultation bilaterally, no rales/rhonchi.  Right IJ TDC ?Abd: Soft, flat, nontender, bowel sounds normal  ?Ext: No right lower extremity edema palpable, left forefoot with wound vac with trace associated edema ? ?Labs: ?BMET ?Recent Labs  ?Lab 09/23/21 ?0414 09/24/21 ?1217 09/25/21 ?1736  09/27/21 ?1621  ?NA 134* 134* 135 136  ?K 3.4* 3.9 3.4* 3.7  ?CL 99 99 99 98  ?CO2 25  --  26 28  ?GLUCOSE 137* 168* 209* 155*  ?BUN 39* 21* 10 10  ?CREATININE 4.92* 4.10* 2.31* 2.26*  ?CALCIUM 8.5*  --  8.5* 8.1*  ?PHOS 4.1  --  2.1* 1.5*  ? ?CBC ?Recent Labs  ?Lab 09/23/21 ?0414 09/24/21 ?1217 09/25/21 ?4709 09/25/21 ?1736 09/27/21 ?1621  ?WBC 12.4*  --  12.4* 13.5* 13.4*  ?HGB 8.5* 8.8* 9.1* 9.1* 8.9*  ?HCT 26.1* 26.0* 27.3* 26.9* 27.6*  ?MCV 89.1  --  89.8 90.0 92.6  ?PLT 258  --  228 235 195  ? ? ?  ?Medications:   ? ? (feeding supplement) PROSource Plus  30 mL Oral BID BM  ? acetaminophen  500 mg Oral Q12H  ? atorvastatin  40 mg Oral Q supper  ? benztropine  1 mg Oral QPM  ? Chlorhexidine Gluconate Cloth  6 each Topical BID  ? [START ON 09/30/2021] darbepoetin (ARANESP) injection - DIALYSIS  40 mcg Intravenous Q Mon-HD  ? enoxaparin (LOVENOX) injection  30 mg Subcutaneous Q24H  ? FLUoxetine  60 mg Oral q morning  ? gabapentin  100 mg Oral Daily  ? insulin aspart  0-5 Units Subcutaneous QHS  ? insulin aspart  0-6 Units Subcutaneous TID WC  ? lurasidone  40 mg Oral QHS  ? metoprolol succinate  50 mg Oral Daily  ? multivitamin  1 tablet Oral QHS  ? prazosin  1 mg Oral QPM  ? protein supplement  1 Scoop Oral TID WC  ? Semaglutide (1 MG/DOSE)  1.3 mg Subcutaneous Q Mon  ? traZODone  300 mg Oral QHS  ? ?Elmarie Shiley, MD ?09/29/2021, 9:20 AM  ? ?

## 2021-09-30 LAB — CBC
HCT: 28.7 % — ABNORMAL LOW (ref 36.0–46.0)
Hemoglobin: 9.1 g/dL — ABNORMAL LOW (ref 12.0–15.0)
MCH: 30.1 pg (ref 26.0–34.0)
MCHC: 31.7 g/dL (ref 30.0–36.0)
MCV: 95 fL (ref 80.0–100.0)
Platelets: 192 10*3/uL (ref 150–400)
RBC: 3.02 MIL/uL — ABNORMAL LOW (ref 3.87–5.11)
RDW: 15.6 % — ABNORMAL HIGH (ref 11.5–15.5)
WBC: 8.4 10*3/uL (ref 4.0–10.5)
nRBC: 0 % (ref 0.0–0.2)

## 2021-09-30 LAB — RENAL FUNCTION PANEL
Albumin: 2.8 g/dL — ABNORMAL LOW (ref 3.5–5.0)
Anion gap: 10 (ref 5–15)
BUN: 43 mg/dL — ABNORMAL HIGH (ref 6–20)
CO2: 24 mmol/L (ref 22–32)
Calcium: 8.3 mg/dL — ABNORMAL LOW (ref 8.9–10.3)
Chloride: 98 mmol/L (ref 98–111)
Creatinine, Ser: 6.4 mg/dL — ABNORMAL HIGH (ref 0.44–1.00)
GFR, Estimated: 7 mL/min — ABNORMAL LOW (ref >60)
Glucose, Bld: 268 mg/dL — ABNORMAL HIGH (ref 70–99)
Phosphorus: 5.7 mg/dL — ABNORMAL HIGH (ref 2.5–4.6)
Potassium: 4 mmol/L (ref 3.5–5.1)
Sodium: 132 mmol/L — ABNORMAL LOW (ref 135–145)

## 2021-09-30 LAB — GLUCOSE, CAPILLARY
Glucose-Capillary: 215 mg/dL — ABNORMAL HIGH (ref 70–99)
Glucose-Capillary: 211 mg/dL — ABNORMAL HIGH (ref 70–99)
Glucose-Capillary: 195 mg/dL — ABNORMAL HIGH (ref 70–99)
Glucose-Capillary: 285 mg/dL — ABNORMAL HIGH (ref 70–99)
Glucose-Capillary: 163 mg/dL — ABNORMAL HIGH (ref 70–99)
Glucose-Capillary: 214 mg/dL — ABNORMAL HIGH (ref 70–99)

## 2021-09-30 MED ORDER — DOCUSATE SODIUM 100 MG PO CAPS
100.0000 mg | ORAL_CAPSULE | Freq: Every day | ORAL | Status: DC
  Administered 2021-09-30 – 2021-10-10 (×10): 100 mg via ORAL
  Filled 2021-09-30 (×10): qty 1

## 2021-09-30 MED ORDER — ALTEPLASE 2 MG IJ SOLR: 2.0000 mg | Freq: Once | INTRAMUSCULAR | Status: DC | PRN

## 2021-09-30 MED ORDER — HEPARIN SODIUM (PORCINE) 1000 UNIT/ML DIALYSIS
40.0000 [IU]/kg | INTRAMUSCULAR | Status: DC | PRN
  Filled 2021-09-30: qty 4

## 2021-09-30 MED ORDER — HEPARIN SODIUM (PORCINE) 1000 UNIT/ML DIALYSIS
1000.0000 [IU] | INTRAMUSCULAR | Status: DC | PRN
  Filled 2021-09-30: qty 1

## 2021-09-30 MED ORDER — DARBEPOETIN ALFA 60 MCG/0.3ML IJ SOSY
60.0000 ug | PREFILLED_SYRINGE | INTRAMUSCULAR | Status: DC
  Administered 2021-09-30: 60 ug via INTRAVENOUS
  Filled 2021-09-30 (×2): qty 0.3

## 2021-09-30 MED ORDER — PROSOURCE PLUS PO LIQD
30.0000 mL | Freq: Two times a day (BID) | ORAL | Status: DC
  Administered 2021-09-30 – 2021-10-05 (×4): 30 mL via ORAL
  Filled 2021-09-30 (×9): qty 30

## 2021-09-30 MED ORDER — SODIUM CHLORIDE 0.9 % IV SOLN: 100.0000 mL | INTRAVENOUS | Status: DC | PRN

## 2021-09-30 NOTE — Progress Notes (Signed)
?                                                       PROGRESS NOTE ? ? ?Subjective/Complaints: ?No new complaints this morning ?When asked, she notes continued constipation. She would be interested in stool softener ? ?ROS: Patient denies fever, rash, sore throat, blurred vision, dizziness, nausea, vomiting, diarrhea, cough, shortness of breath or chest pain, joint or back/neck pain, headache, or mood change. +constipation ? ?Objective: ?  ?No results found. ? ?Recent Labs  ?  09/27/21 ?1621  ?WBC 13.4*  ?HGB 8.9*  ?HCT 27.6*  ?PLT 195  ? ?Recent Labs  ?  09/27/21 ?4196  ?NA 136  ?K 3.7  ?CL 98  ?CO2 28  ?GLUCOSE 155*  ?BUN 10  ?CREATININE 2.26*  ?CALCIUM 8.1*  ? ? ?Intake/Output Summary (Last 24 hours) at 09/30/2021 0923 ?Last data filed at 09/30/2021 0800 ?Gross per 24 hour  ?Intake 594 ml  ?Output 0 ml  ?Net 594 ml  ?  ? ?Pressure Injury 09/25/21 Sacrum Medial;Upper Stage 2 -  Partial thickness loss of dermis presenting as a shallow open injury with a red, pink wound bed without slough. 1 mm x 29mm (Active)  ?09/25/21 2249  ?Location: Sacrum  ?Location Orientation: Medial;Upper  ?Staging: Stage 2 -  Partial thickness loss of dermis presenting as a shallow open injury with a red, pink wound bed without slough.  ?Wound Description (Comments): 1 mm x 44mm (Staged by DARA, RN)  ?Present on Admission: Yes  ? ? ?Physical Exam: ?Vital Signs ?Blood pressure 118/69, pulse 66, temperature 98 ?F (36.7 ?C), temperature source Oral, resp. rate 17, height 5\' 7"  (1.702 m), weight 93.9 kg, SpO2 95 %. ?Constitutional: No distress . Vital signs reviewed. BMI 32.42 ?HEENT: NCAT, EOMI, oral membranes moist ?Neck: supple ?Cardiovascular: RRR without murmur. No JVD    ?Respiratory/Chest: CTA Bilaterally without wheezes or rales. Normal effort    ?GI/Abdomen: BS +, non-tender, non-distended ?Ext: no clubbing, cyanosis, or edema ?Psych: flat but cooperative  ?Skin: right IJ TDC, left foot with vac, sacral wound ?Neuro/MSK: Alert. Lef foot  in ace bandage with wound vac. Impaired problem solving, delayed in general.  ? ?Assessment/Plan: ?1. Functional deficits which require 3+ hours per day of interdisciplinary therapy in a comprehensive inpatient rehab setting. ?Physiatrist is providing close team supervision and 24 hour management of active medical problems listed below. ?Physiatrist and rehab team continue to assess barriers to discharge/monitor patient progress toward functional and medical goals ? ?Care Tool: ? ?Bathing ?   ?Body parts bathed by patient: Right arm, Left arm, Chest, Abdomen, Front perineal area, Right upper leg, Left upper leg  ? Body parts bathed by helper: Buttocks, Right lower leg ?  ?  ?Bathing assist Assist Level: Moderate Assistance - Patient 50 - 74% ?  ?  ?Upper Body Dressing/Undressing ?Upper body dressing   ?What is the patient wearing?: Pull over shirt ?   ?Upper body assist Assist Level: Supervision/Verbal cueing ?   ?Lower Body Dressing/Undressing ?Lower body dressing ? ? ? Lower body dressing activity did not occur: Safety/medical concerns (Wound vac temp disconnected by nursing staff) ?What is the patient wearing?: Pants ? ?  ? ?Lower body assist Assist for lower body dressing: Moderate Assistance - Patient 50 - 74% ?   ? ?  Toileting ?Toileting    ?Toileting assist Assist for toileting: Maximal Assistance - Patient 25 - 49% ?  ?  ?Transfers ?Chair/bed transfer ? ?Transfers assist ? Chair/bed transfer activity did not occur: Refused ? ?Chair/bed transfer assist level: Minimal Assistance - Patient > 75% ?  ?  ?Locomotion ?Ambulation ? ? ?Ambulation assist ? ? Ambulation activity did not occur: Refused ? ?  ?  ?   ? ?Walk 10 feet activity ? ? ?Assist ? Walk 10 feet activity did not occur: Refused ? ?  ?   ? ?Walk 50 feet activity ? ? ?Assist Walk 50 feet with 2 turns activity did not occur: Safety/medical concerns ? ?  ?   ? ? ?Walk 150 feet activity ? ? ?Assist Walk 150 feet activity did not occur: Safety/medical  concerns ? ?  ?  ?  ? ?Walk 10 feet on uneven surface  ?activity ? ? ?Assist Walk 10 feet on uneven surfaces activity did not occur: Safety/medical concerns ? ? ?  ?   ? ?Wheelchair ? ? ? ? ?Assist Is the patient using a wheelchair?: Yes ?Type of Wheelchair: Manual ?Wheelchair activity did not occur: Refused ? ?Wheelchair assist level: Supervision/Verbal cueing ?Max wheelchair distance: 25 09/27/21 indoors, 10 feet 09/28/21 outdoor paved surfaces  ? ? ?Wheelchair 50 feet with 2 turns activity ? ? ? ?Assist ? ?  ?Wheelchair 50 feet with 2 turns activity did not occur: Refused ? ? ?   ? ?Wheelchair 150 feet activity  ? ? ? ?Assist ? Wheelchair 150 feet activity did not occur: Refused ? ? ?   ? ?Blood pressure 118/69, pulse 66, temperature 98 ?F (36.7 ?C), temperature source Oral, resp. rate 17, height 5\' 7"  (1.702 m), weight 93.9 kg, SpO2 95 %. ? ? ? Medical Problem List and Plan: ?1. Functional deficits secondary to left trimalleolar ankle fracture ?            -patient may not shower until wound vac removal. ?            -ELOS/Goals: 14-16 days mod I ?           -Continue CIR therapies including PT, OT  ?2.  Impaired mobility:  Lovenox.  ?3. Postoperative pain: Tylenol BID. Decrease oxycodone to 5mg  6H prn.  ?4. Mood/sleep: team providing ego support.  ?            -antipsychotic agents: Latuda ? -pt also on high dose trazodone at bedtime ?5. Neuropsych: This patient is capable of making decisions on her own behalf. ?6. Left lower extremity trimalleolar fracture: wound avc to be removed Wednesday 5/10 ?7. Obesity: BMI 32.32 provide dietary education. Restart Semaglutide. 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 HTN: Monitor BP TID ?11. T2DM: Hgb A1C- 5.7 (down from 6.6  a couple of  months ago)--was on glucotrol and ozempic PTA ?            --CBG (last 3)  ?Recent Labs  ?  09/29/21 ?1655 09/29/21 ?2057 09/30/21 ?0601  ?GLUCAP 191* [place] ? 5/7-restart Semaglutide.--first dose  09/30/21 from home ? -cover with ssi for now ? -change prosource to with meals.  ?12. Bipolar disorder: On Latuda, Trazodone and Prozac.  ?--Parkinsonism/EPS  managed with benztropine.  ?-might benefit from neuro-psych eval ?13. Anemia of chronic disease: continue to monitor Hgb with HD labs.  ?14. Screening for vitamin D deficiency: Vitamin D, 25 hydroxyl-: Level  high normal @ 93.97. Does not need supplementation ?15. Hyponatremia: normalized, continue to monitor Na with HD labs ?16. Anuric: discussed relation to uremia, hemodialysis fluid restriction ? -no pvr's, making intermittent urine only ?17. Poor po intake ? 5/7 -reached out nephro. [place] for reg CM diet ?18. Constipation: add colace daily.  ?19. Obesity BMI 32.42: change prosource to with meals.  ? ?LOS: ?5 days ?A FACE TO FACE EVALUATION WAS PERFORMED ? ?Martha Clan P Idabelle Mcpeters ?09/30/2021, 9:23 AM  ? ?  ?

## 2021-09-30 NOTE — Progress Notes (Signed)
Patient ID: Jessica Mercado, female   DOB: 1961/05/05, 61 y.o.   MRN: [id number] ? ?[facility] ?Progress Note  ? ?Assessment/ Plan:   ?1. Falls/syncope/left trimalleolar ankle fracture: The fall and syncope was likely related to worsening uremia and she is undergoing evaluation by orthopedic surgery for management of her trimalleolar ankle fracture.  Admitted to inpatient rehabilitation for continued strengthening to facilitate eventual discharge home. ? ?2. 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  ?- HD today per MWF schedule  ?- she will have a MWF schedule upon discharge at [place] ? ?3. Anemia: Likely secondary to anemia of chronic disease, continue ongoing management with ESA.  Increase aranesp to 60 mcg weekly on mondays.  Status post intravenous iron. ? 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 ? ?5. Hypertension: Blood pressures controlled with dialysis and low-dose prazosin.  Appears to be at her dry weight. ? ?Dispo - spoke with HD SW - meetings are on Tuesdays and she will let us know if updates.  ? ? ?Subjective:   ?Last HD on 5/5 with 1.5 kg UF.  Strict ins/outs not available.  She states that she is not sure of discharge plans.  States HD is going ok.  ? ?Review of systems: ?Denies n/v ?Denies chest pain or shortness of breath  ? ?Objective:   ?BP 118/69 (BP Location: Left Arm)   Pulse 66   Temp 98 ?F (36.7 ?C) (Oral)   Resp 17   Ht 5\' 7"  (1.702 m)   Wt 93.9 kg   LMP  (LMP Unknown)   SpO2 95%   BMI 32.42 kg/m?  ? ?Physical Exam:  ?General adult female in bed in no acute distress ?HEENT normocephalic atraumatic extraocular movements intact sclera anicteric ?Neck supple trachea midline ?Lungs clear to auscultation bilaterally normal work of breathing at rest  ?Heart S1S2 no rub ?Abdomen soft nontender  nondistended ?Extremities no edema; left foot in boot ?Psych normal mood and affect ?Access tunneled HD catheter in place ? ?Labs: ?BMET ?Recent Labs  ?Lab 09/24/21 ?1217 09/25/21 ?1736 09/27/21 ?1621 09/30/21 ?1231  ?NA 134* 135 136 132*  ?K 3.9 3.4* 3.7 4.0  ?CL 99 99 98 98  ?CO2  --  26 28 24   ?GLUCOSE 168* 209* 155* 268*  ?BUN 21* 10 10 43*  ?CREATININE 4.10* 2.31* 2.26* 6.40*  ?CALCIUM  --  8.5* 8.1* 8.3*  ?PHOS  --  2.1* 1.5* 5.7*  ? ?CBC ?Recent Labs  ?Lab 09/25/21 ?7322 09/25/21 ?1736 09/27/21 ?1621 09/30/21 ?1231  ?WBC 12.4* 13.5* 13.4* 8.4  ?HGB 9.1* 9.1* 8.9* 9.1*  ?HCT 27.3* 26.9* 27.6* 28.7*  ?MCV 89.8 90.0 92.6 95.0  ?PLT 228 235 195 192  ? ? ?  ?Medications:   ? ? (feeding supplement) PROSource Plus  30 mL Oral BID WC  ? acetaminophen  500 mg Oral Q12H  ? atorvastatin  40 mg Oral Q supper  ? benztropine  1 mg Oral QPM  ? Chlorhexidine Gluconate Cloth  6 each Topical BID  ? darbepoetin (ARANESP) injection - DIALYSIS  40 mcg Intravenous Q Mon-HD  ? docusate sodium  100 mg Oral Daily  ? enoxaparin (LOVENOX) injection  30 mg Subcutaneous Q24H  ? FLUoxetine  60 mg Oral q morning  ? gabapentin  100 mg Oral Daily  ? insulin aspart  0-5 Units Subcutaneous QHS  ?  insulin aspart  0-6 Units Subcutaneous TID WC  ? lurasidone  40 mg Oral QHS  ? metoprolol succinate  50 mg Oral Daily  ? multivitamin  1 tablet Oral QHS  ? prazosin  1 mg Oral QPM  ? protein supplement  1 Scoop Oral TID WC  ? Semaglutide (1 MG/DOSE)  1.3 mg Subcutaneous Q Mon  ? traZODone  300 mg Oral QHS  ? ?Claudia Desanctis, MD ?09/30/2021, 1:49 PM ? ? ?

## 2021-09-30 NOTE — Progress Notes (Signed)
Occupational Therapy Session Note ? ?Patient Details  ?Name: Jessica Mercado ?MRN: [id number] ?Date of Birth: January 16, 1961 ? ?Today's Date: 09/30/2021 ?OT Individual Time: 3329-5188 ?OT Individual Time Calculation (min): 70 min  ? ? ?Short Term Goals: ?Week 1:  OT Short Term Goal 1 (Week 1): Patient will be able to transfer to [facility] with mod a and cues ?OT Short Term Goal 2 (Week 1): Patient will  demonstrate ability to stand long enough to pull up/down LB clothing with min assist ?OT Short Term Goal 3 (Week 1): Patient will maintain LLE NWB status with functional mobility tasks ?OT Short Term Goal 4 (Week 1): Patient will tolerate upper body strengthening exercises to allow increased use of UE  on walker for functioanl mobility ? ?Skilled Therapeutic Interventions/Progress Updates:  ?Skilled OT intervention completed with focus on ADL retraining, activity tolerance, functional transfers. Pt received upright in bed, asleep, requiring multimodal stimuli to waken. Denied pain. Completed bed mobility with supervision with increased time, then pt preferring to laterally scoot to w/c > L side with Min A and cues needed for boosting over slight incline. Poor adherence to NWB on LLE, with consistent tactile cues needed. Education provided regarding NWB status with pt stating "it's habit to put weight on that side since I fractured the R ankle" with therapist encouraging pt to keep weight off LLE and reasons why.  ? ?Backed up to sink, therapist washed pt's hair with total A with use of shower tray 2/2 wound vac/HD port. Significant matting in pt's hair found, with time spent with pt actively involved in de--tangling hair. Towel dry and hair grooming with set up A. During hair washing, discussed pt's home set up and assist available with son in law as the primary caregiver. Completed UB dress with supervision, then mod A for LB dressing with assist needed for threading over wound vac and at the sit > stand level with RW and min A,  total A donning of pants over hips while therapist blocking LLE for NWB adherence. Pt agreeable to stay up in w/c for prompt PT session. Pt was left seated in w/c, with BLE elevated, covered with a blanket per request, chair alarm on and all needs in reach at end of session. ?  ? ?Therapy Documentation ?Precautions:  ?Precautions ?Precautions: Fall ?Precaution Comments: wound vac L LE ?Required Braces or Orthoses: Splint/Cast ?Splint/Cast - Date Prophylactic Dressing Applied (if applicable): 41/66/06 ?Restrictions ?Weight Bearing Restrictions: Yes ?LLE Weight Bearing: Non weight bearing ? ? ? ? ? ?Therapy/Group: Individual Therapy ? ?Jerzy Crotteau E Lucius Wise ?09/30/2021, 7:24 AM ?

## 2021-09-30 NOTE — Procedures (Signed)
Seen and examined on dialysis.  Procedure supervised.  Blood pressure 94/50 and HR 69.  RIJ tunn catheter in use.  Tolerating goal.   ? ?Stop prazosin.  ? ?Claudia Desanctis, MD ?09/30/2021 4:37 PM ? ? ? ?

## 2021-09-30 NOTE — Progress Notes (Addendum)
Physical Therapy Session Note ? ?Patient Details  ?Name: Jessica Mercado ?MRN: [id number] ?Date of Birth: 1961/01/18 ? ?Today's Date: 09/30/2021 ?PT Individual Time: 5009-3818 and 2993-7169 ?PT Individual Time Calculation (min): 54 min and 39 min ? ?Short Term Goals: ?Week 1:  PT Short Term Goal 1 (Week 1): Patient will adhere to weight bearing precautions with all mobility at least 50% of the time ?PT Short Term Goal 2 (Week 1): Patient will transfer bed <> wc with LRAD and ModA ?PT Short Term Goal 3 (Week 1): Patient will complete sit <> stand with LRAD and CGA while maintaining wbng precautions ?PT Short Term Goal 4 (Week 1): Patient will initiate wc mob ? ?Skilled Therapeutic Interventions/Progress Updates:  ? Treatment Session 1 ?Received pt sitting in WC, pt agreeable to PT treatment, and denied any pain during session. MD arrived for morning rounds. Session with emphasis on functional mobility/transfers, generalized strengthening and endurance, and dynamic standing balance/coordination. Pt performed WC mobility 43ft using BUE and supervision with maximal encouragement, then transported remainder of way to dayroom dependently due to fatigue. Pt transferred WC<>mat stand<>pivot with RW and min A with therapist's foot underneath pt's L foot, however pt still placing >50% weight on LLE despite cues. Worked on blocked practice sit<>stands with RW and min A with special attention to adhering to LLE NWB precautions: ?-Trials 1&2 with therapist's foot under pt's L foot but pt still putting >75% weight through L leg - but was able to lift LLE in air for 2-3 seconds prior to sitting. Pt then reported increased pain in LLE - explained this was most likely due to pt placing too much weight on LLE. ?-Trial 3&4 with gait belt around LLE and therapist holding it in air - pt able to remain standing ~ 5 seconds prior to sitting due to fatigue/difficulty ?Pt then refused any further standing activities. Attempted to work on seated  problem solving/sequencing on pegboard, but pt reported not being able to see and not having her glasses here. Therefore, transitioned to dynamic sitting balance playing horseshoes (tossing with RUE) and supervision x 3 trials. Worked on dynamic sitting balance, core strengthening, and UE strength batting beach ball with 1lb dowel 3x15 to fatigue - of note, pt not giving 100% effort with activity, barely tapping ball. Pt then transferred mat<>WC squat<>pivot with min A with therapist holding LLE in air. Pt transported back to room in [facility] dependently. Concluded session with pt sitting in WC, needs within reach, and chair pad alarm on.  ? ?Treatment Session 2 ?Received pt sitting in WC asleep, upon wakening pt agreeable to PT treatment, and denied any pain during session, just fatigue. Session with emphasis on functional mobility/transfers and generalized strengthening and endurance. Pt transported to/from room in [facility] dependently for time management and energy conservation purposes. Pt performed seated BUE strengthening on UBE at level 1 alternating 1 minute forwards and 1 minutes backwards for a total of 4 minutes. Pt then performed the following seated exercises with emphasis on LE strength. Of note, pt with increased difficulty counting, requiring cues for focused attention: ?-LAQ 2x10 bilaterally with 2.5lb ankle weight on RLE ?-hip flexion 2x10 bilaterally with 2.5lb ankle weight on RLE ?-hip adduction ball squeezes 2x10 ?Returned to room and pt transferred WC<>bed squat<>pivot to R with min A with therapist holding LLE in air. Performed lateral scoots to [facility] - pt placing full weight on LLE despite cues for NWB. Sit<>supine with supervision and scooted to [facility] pulling on headboard with min  A and performed 2x10 SLR bilaterally. Concluded session with pt semi-reclined in bed, needs within reach, and bed alarm on. NT present at bedside checking blood glucose.  ? ?Therapy Documentation ?Precautions:   ?Precautions ?Precautions: Fall ?Precaution Comments: wound vac L LE ?Required Braces or Orthoses: Splint/Cast ?Splint/Cast - Date Prophylactic Dressing Applied (if applicable): 24/49/75 ?Restrictions ?Weight Bearing Restrictions: Yes ?RLE Weight Bearing: Weight bearing as tolerated ?LLE Weight Bearing: Non weight bearing ? ?Therapy/Group: Individual Therapy ?Blenda Nicely ?Becky Sax PT, DPT  ?09/30/2021, 7:23 AM  ?

## 2021-09-30 NOTE — Procedures (Signed)
Arrived to unit with BP of 138/78, Permcath dressing intact and occlusive. Each limb is easily drawn and flushed.  Pt had some hypotension during her treatment that was easily resolved by turning off her UF and allowing her to naturally refill.  Otherwise she tolerated her treatment and her final BP was 114/56 and we were able to remove 1025 mls UF ?

## 2021-10-01 DIAGNOSIS — N186 End stage renal disease: Secondary | ICD-10-CM

## 2021-10-01 DIAGNOSIS — Z992 Dependence on renal dialysis: Secondary | ICD-10-CM

## 2021-10-01 LAB — HEPATITIS B SURFACE ANTIGEN: Hepatitis B Surface Ag: NONREACTIVE

## 2021-10-01 LAB — GLUCOSE, CAPILLARY
Glucose-Capillary: 186 mg/dL — ABNORMAL HIGH (ref 70–99)
Glucose-Capillary: 225 mg/dL — ABNORMAL HIGH (ref 70–99)
Glucose-Capillary: 247 mg/dL — ABNORMAL HIGH (ref 70–99)
Glucose-Capillary: 206 mg/dL — ABNORMAL HIGH (ref 70–99)

## 2021-10-01 LAB — HEPATITIS B SURFACE ANTIBODY,QUALITATIVE: Hep B S Ab: NONREACTIVE

## 2021-10-01 MED ORDER — SEMAGLUTIDE (1 MG/DOSE) 4 MG/3ML ~~LOC~~ SOPN
1.0000 mg | PEN_INJECTOR | SUBCUTANEOUS | Status: DC
  Administered 2021-10-01: 1 mg via SUBCUTANEOUS
  Filled 2021-10-01: qty 0.75

## 2021-10-01 MED ORDER — CHLORHEXIDINE GLUCONATE CLOTH 2 % EX PADS
6.0000 | MEDICATED_PAD | Freq: Every day | CUTANEOUS | Status: DC
  Administered 2021-10-03: 6 via TOPICAL

## 2021-10-01 NOTE — Plan of Care (Signed)
?  Problem: RH Balance ?Goal: LTG Patient will maintain dynamic standing balance (PT) ?Description: LTG:  Patient will maintain dynamic standing balance with assistance during mobility activities (PT) ?Outcome: Not Applicable ?Flowsheets (Taken 10/01/2021 1255) ?LTG: Pt will maintain dynamic standing balance during mobility activities with:: (D/C due to poor adherance to WB precautions) -- ?Note: D/C due to poor adherance to WB precautions ?  ?Problem: Sit to Stand ?Goal: LTG:  Patient will perform sit to stand with assistance level (PT) ?Description: LTG:  Patient will perform sit to stand with assistance level (PT) ?Outcome: Not Applicable ?Flowsheets (Taken 10/01/2021 1255) ?LTG: PT will perform sit to stand in preparation for functional mobility with assistance level: (D/C due to poor adherance to WB precautions) -- ?Note: D/C due to poor adherance to WB precautions ?  ?Problem: RH Ambulation ?Goal: LTG Patient will ambulate in home environment (PT) ?Description: LTG: Patient will ambulate in home environment, # of feet with assistance (PT). ?Outcome: Not Applicable ?Flowsheets (Taken 10/01/2021 1255) ?LTG: Pt will ambulate in home environ  assist needed:: (D/C due to poor adherance to WB precautions) -- ?Note: D/C due to poor adherance to WB precautions ?  ?Problem: RH Bed Mobility ?Goal: LTG Patient will perform bed mobility with assist (PT) ?Description: LTG: Patient will perform bed mobility with assistance, with/without cues (PT). ?Flowsheets (Taken 10/01/2021 1255) ?LTG: Pt will perform bed mobility with assistance level of: (downgraded due to weakness) Independent with assistive device  ?Note: downgraded due to weakness ?  ?Problem: RH Wheelchair Mobility ?Goal: LTG Patient will propel w/c in controlled environment (PT) ?Description: LTG: Patient will propel wheelchair in controlled environment, # of feet with assist (PT) ?Flowsheets (Taken 10/01/2021 1255) ?LTG: Pt will propel w/c in controlled environ  assist  needed:: (downgraded due to weakness/fatigue) Supervision/Verbal cueing ?LTG: Propel w/c distance in controlled environment: 3ft ?Note: downgraded due to weakness/fatigue ?Goal: LTG Patient will propel w/c in home environment (PT) ?Description: LTG: Patient will propel wheelchair in home environment, # of feet with assistance (PT). ?Flowsheets (Taken 10/01/2021 1255) ?LTG: Pt will propel w/c in home environ  assist needed:: (downgraded due to weakness/fatigue) Supervision/Verbal cueing ?LTG: Propel w/c distance in home environment: 77ft ?Note: downgraded due to weakness/fatigue ?  ?Problem: RH Furniture Transfers ?Goal: LTG Patient will perform furniture transfers w/assist (OT/PT) ?Description: LTG: Patient will perform furniture transfers  with assistance (OT/PT). ?Flowsheets (Taken 10/01/2021 1255) ?LTG: Pt will perform furniture transfers with assist:: (downgraded due to weakness and poor adherance to WB precautions) Supervision/Verbal cueing ?Note: downgraded due to weakness and poor adherance to WB precautions ?  ?

## 2021-10-01 NOTE — Progress Notes (Signed)
Physical Therapy Session Note ? ?Patient Details  ?Name: Jessica Mercado ?MRN: [id number] ?Date of Birth: 1960-08-14 ? ?Today's Date: 10/01/2021 ?PT Individual Time: 1100-1153 ?PT Individual Time Calculation (min): 53 min  ? ?Short Term Goals: ?Week 1:  PT Short Term Goal 1 (Week 1): Patient will adhere to weight bearing precautions with all mobility at least 50% of the time ?PT Short Term Goal 2 (Week 1): Patient will transfer bed <> wc with LRAD and ModA ?PT Short Term Goal 3 (Week 1): Patient will complete sit <> stand with LRAD and CGA while maintaining wbng precautions ?PT Short Term Goal 4 (Week 1): Patient will initiate wc mob ? ?Skilled Therapeutic Interventions/Progress Updates:  ? Received pt sitting in [facility] thinking it was time for lunch - oriented pt to current time of 11am and informed pt that lunch would not be until after 12. Pt agreeable to PT treatment and denied any pain during session. Discussed with pt CLOF and discharge plan and explained that pt currently requires min A and per her son-in-law, he is unable to provide any physical assist (on disability). Therefore, pt needs to be as independent as possible upon D/C and would benefit from a longer stay in rehab - pt verbalized understanding. Session with emphasis on functional mobility/transfers, generalized strengthening and endurance, and WC mobility. Pt performed WC mobility 8ft using BUE and supervision with increased time - limited by UE fatigue and transported remainder of way to main gym dependently. Introduced Teacher, early years/pre and educated pt on purpose as well as set up/technique. Pt transferred WC<>mat via slideboard to R with min A overall via multiple scoots (total A to place board) with LLE supported on therapist's foot, however pt still placing ~25% weight through LLE. Pt required cues for anterior weight shifting, head/hips relationship, and hand placement - poor carry over with education. Nephrology MD present for brief assessment to discuss  fistula placement. Discussed equipment for D/C, and pt reported having manual WC and transport chair at home. Then discussed transportation, and pt reported that her son in law does not have a car and that she uses a transportation service to get to/from appointments. Pt reported that there is WC accessibility options for transportation and discussed having pt use those options until WB restirctions have been lifted. Of note, with discussion and education had during entire session, pt with limited recall of information and with overall poor health literacy. Returned to room and concluded session with pt sitting in WC, needs within reach, and chair pad alarm on.  ? ?Therapy Documentation ?Precautions:  ?Precautions ?Precautions: Fall ?Precaution Comments: wound vac L LE ?Required Braces or Orthoses: Splint/Cast ?Splint/Cast - Date Prophylactic Dressing Applied (if applicable): 36/62/94 ?Restrictions ?Weight Bearing Restrictions: Yes ?RLE Weight Bearing: Weight bearing as tolerated ?LLE Weight Bearing: Non weight bearing ? ?Therapy/Group: Individual Therapy ?Blenda Nicely ?Becky Sax PT, DPT  ?10/01/2021, 7:36 AM  ?

## 2021-10-01 NOTE — Progress Notes (Signed)
Pt sitting up in bed, dosing off, but refusing to go to sleep. Pt was encouraged to turn lights and tv off to aid in sleeping but insisted they stay on. Pt denies pain at this time.  ?

## 2021-10-01 NOTE — Progress Notes (Signed)
Patient ID: Jessica Mercado, female   DOB: February 16, 1961, 61 y.o.   MRN: [id number] ? ?This SW covering for primary SW, Erlene Quan. ? ?Per MD, pt is eager to discharge and recommends family education.  ? ?SW called patient son Jessica Mercado to discuss scheduling family education. Reports he does not have transportation to get here, and unable to find a way to get here since his roommate lives in [place] and works from 630am-5:30p/6p and he is unable to leave his dog for a long period. He states he is educated on how to care for people from working and trained in nursing. He does report he is disabled and supports she will need to be able to do more for herself.   ? ?SW updated medical team.  ? ?Jessica Mercado, MSW, LCSWA ?Office: [phone] ?Cell: [phone] ?Fax: [phone]  ?

## 2021-10-01 NOTE — Progress Notes (Signed)
?                                                       PROGRESS NOTE ? ? ?Subjective/Complaints: ?Wants to go home. Says she has a wheelchair and hospital bed at home and that son is comfortable caring for her. Discussed wound vac to be removed tomorrow.  ? ?ROS: Patient denies fever, rash, sore throat, blurred vision, dizziness, nausea, vomiting, diarrhea, cough, shortness of breath or chest pain, joint or back/neck pain, headache, or mood change, pain. +constipation ? ?Objective: ?  ?No results found. ? ?Recent Labs  ?  09/30/21 ?1231  ?WBC 8.4  ?HGB 9.1*  ?HCT 28.7*  ?PLT 192  ? ?Recent Labs  ?  09/30/21 ?1231  ?NA 132*  ?K 4.0  ?CL 98  ?CO2 24  ?GLUCOSE 268*  ?BUN 43*  ?CREATININE 6.40*  ?CALCIUM 8.3*  ? ? ?Intake/Output Summary (Last 24 hours) at 10/01/2021 1005 ?Last data filed at 10/01/2021 2633 ?Gross per 24 hour  ?Intake 350 ml  ?Output 1025 ml  ?Net -675 ml  ?  ? ?Pressure Injury 09/25/21 Sacrum Medial;Upper Stage 2 -  Partial thickness loss of dermis presenting as a shallow open injury with a red, pink wound bed without slough. 1 mm x 76mm (Active)  ?09/25/21 2249  ?Location: Sacrum  ?Location Orientation: Medial;Upper  ?Staging: Stage 2 -  Partial thickness loss of dermis presenting as a shallow open injury with a red, pink wound bed without slough.  ?Wound Description (Comments): 1 mm x 3mm (Staged by DARA, RN)  ?Present on Admission: Yes  ? ? ?Physical Exam: ?Vital Signs ?Blood pressure 122/65, pulse 73, temperature 98.7 ?F (37.1 ?C), temperature source Oral, resp. rate 17, height 5\' 7"  (1.702 m), weight 88.3 kg, SpO2 96 %. ?Constitutional: No distress . Vital signs reviewed. BMI 32.42 ?HEENT: NCAT, EOMI, oral membranes moist ?Neck: supple ?Cardiovascular: RRR without murmur. No JVD    ?Respiratory/Chest: CTA Bilaterally without wheezes or rales. Normal effort    ?GI/Abdomen: BS +, non-tender, non-distended ?Ext: no clubbing, cyanosis, or edema ?Psych: flat but cooperative  ?Skin: right IJ TDC, left foot  with vac, sacral wound stage 2 ?Neuro/MSK: Alert. Lef foot in ace bandage with wound vac. Impaired problem solving, delayed in general.  ? ?Assessment/Plan: ?1. Functional deficits which require 3+ hours per day of interdisciplinary therapy in a comprehensive inpatient rehab setting. ?Physiatrist is providing close team supervision and 24 hour management of active medical problems listed below. ?Physiatrist and rehab team continue to assess barriers to discharge/monitor patient progress toward functional and medical goals ? ?Care Tool: ? ?Bathing ?   ?Body parts bathed by patient: Right arm, Left arm, Chest, Abdomen, Right upper leg, Left upper leg, Right lower leg, Face  ? Body parts bathed by helper: Buttocks, Right lower leg ?Body parts n/a: Left lower leg (cast) ?  ?Bathing assist Assist Level: Supervision/Verbal cueing ?  ?  ?Upper Body Dressing/Undressing ?Upper body dressing   ?What is the patient wearing?: Pull over shirt ?   ?Upper body assist Assist Level: Supervision/Verbal cueing ?   ?Lower Body Dressing/Undressing ?Lower body dressing ? ? ? Lower body dressing activity did not occur: Safety/medical concerns (Wound vac temp disconnected by nursing staff) ?What is the patient wearing?: Pants ? ?  ? ?Lower body assist  Assist for lower body dressing: Moderate Assistance - Patient 50 - 74% ?   ? ?Toileting ?Toileting    ?Toileting assist Assist for toileting: Maximal Assistance - Patient 25 - 49% ?  ?  ?Transfers ?Chair/bed transfer ? ?Transfers assist ? Chair/bed transfer activity did not occur: Refused ? ?Chair/bed transfer assist level: Minimal Assistance - Patient > 75% ?  ?  ?Locomotion ?Ambulation ? ? ?Ambulation assist ? ? Ambulation activity did not occur: Refused ? ?  ?  ?   ? ?Walk 10 feet activity ? ? ?Assist ? Walk 10 feet activity did not occur: Refused ? ?  ?   ? ?Walk 50 feet activity ? ? ?Assist Walk 50 feet with 2 turns activity did not occur: Safety/medical concerns ? ?  ?   ? ? ?Walk 150  feet activity ? ? ?Assist Walk 150 feet activity did not occur: Safety/medical concerns ? ?  ?  ?  ? ?Walk 10 feet on uneven surface  ?activity ? ? ?Assist Walk 10 feet on uneven surfaces activity did not occur: Safety/medical concerns ? ? ?  ?   ? ?Wheelchair ? ? ? ? ?Assist Is the patient using a wheelchair?: Yes ?Type of Wheelchair: Manual ?Wheelchair activity did not occur: Refused ? ?Wheelchair assist level: Supervision/Verbal cueing ?Max wheelchair distance: 69ft  ? ? ?Wheelchair 50 feet with 2 turns activity ? ? ? ?Assist ? ?  ?Wheelchair 50 feet with 2 turns activity did not occur: Refused ? ? ?   ? ?Wheelchair 150 feet activity  ? ? ? ?Assist ? Wheelchair 150 feet activity did not occur: Refused ? ? ?   ? ?Blood pressure 122/65, pulse 73, temperature 98.7 ?F (37.1 ?C), temperature source Oral, resp. rate 17, height 5\' 7"  (1.702 m), weight 88.3 kg, SpO2 96 %. ? ? ? Medical Problem List and Plan: ?1. Functional deficits secondary to left trimalleolar ankle fracture ?            -patient may not shower until wound vac removal. ?            -ELOS/Goals: 14-16 days mod I ?           -Continue CIR therapies including PT, OT  ? -messaged SW to schedule caregiver training with son.  ?2.  Impaired mobility, nonambulatory:  continue Lovenox.  ?3. Postoperative pain: Tylenol BID. Discontinue oxycodone.  ?4. Bipolar disorder?: continue Latuda ?5. Neuropsych: This patient is capable of making decisions on her own behalf. ?6. Left lower extremity trimalleolar fracture: wound avc to be removed Wednesday 5/10 ?7. Obesity: BMI 32.32 provide dietary education. Restart Semaglutide. 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 HTN: Monitor BP TID ?11. T2DM: Hgb A1C- 5.7 (down from 6.6  a couple of  months ago)--was on glucotrol and ozempic PTA ?            --CBG (last 3)  ?Recent Labs  ?  09/30/21 ?1858 09/30/21 ?5638 10/01/21 ?7564  ?GLUCAP 163* 214* 186*  ?   -  ssi ? 5/7-restart Semaglutide.--first  dose 09/30/21 from home ? -cover with ssi for now ? -change prosource to with meals.  ?12. Bipolar disorder: On Latuda, Trazodone and Prozac.  ?--Parkinsonism/EPS  managed with benztropine.  ?-might benefit from neuro-psych eval ?13. Anemia of chronic disease: continue to monitor Hgb with HD labs.  ?14. Screening for vitamin D deficiency: Vitamin D, 25 hydroxyl-: Level high normal @ 93.97. Does not need supplementation ?15. Hyponatremia:  normalized, continue to monitor Na with HD labs ?16. Anuric: discussed relation to uremia, hemodialysis fluid restriction ? -no pvr's, making intermittent urine only ?17. Poor po intake ? 5/7 -reached out nephro. [place] for reg CM diet ?18. Constipation: add colace daily.  ?19. Obesity BMI 32.42: change prosource to with meals.  ?20. Insomnia: continue trazodone ? ?LOS: ?6 days ?A FACE TO FACE EVALUATION WAS PERFORMED ? ?Saman Umstead P Ibrahim Mcpheeters ?10/01/2021, 10:05 AM  ? ?  ?

## 2021-10-01 NOTE — Progress Notes (Signed)
Nutrition Follow-up ? ?DOCUMENTATION CODES:  ? ?Obesity unspecified ? ?INTERVENTION:  ? ?Beneprotein 1 scoop TID, each supplement provides 25 kcals and 6 grams of protein ?30 ml ProSource Plus TID, each supplement provides 100 kcals and 15 grams protein.  ?Renal MVI with minerals daily ? ?NUTRITION DIAGNOSIS:  ? ?Increased nutrient needs related to catabolic illness as evidenced by estimated needs. - Ongoing ? ?GOAL:  ? ?Patient will meet greater than or equal to 90% of their needs - Progressing  ? ?MONITOR:  ? ?PO intake, Supplement acceptance, Diet advancement, Labs, Weight trends ? ?REASON FOR ASSESSMENT:  ? ?Consult ?Diet education ? ?ASSESSMENT:  ? ?Pt admitted to CIR from hospital d/t fuctional declines d/t fall with L ankle dislocation s/p ORIF. During admission, pt progressed to ESRD with falls d/t uremia and subsequently started on HD. PMH significant for T2DM with retinopathy, bipolar disorder, CHF, HTN with nephropathy, R ankle fracture and 2-3 months of recurrent falls. ? ?Pt sitting up in wheelchair at time of RD visit; no family at bedside. RD observed 100% of lunch tray sitting in room. ? ?Pt reports that since her diet has been liberalized she has been eating much better. Denies any nausea or vomiting with dialysis treatment.  ?Reports hat she would like to continue the ProSource at this time even thought her PO intake has improved.  ?Per EMR, pt intake includes: ?5/06: Breakfast 25%, Lunch 100%, Dinner 75% ?5/07: Breakfast 50%, Lunch 75%, Dinner 25% ?5/08: Breakfast 100%, Lunch 100% ? ?This RD reviewed renal/dialysis diet education with pt that was provided by previous RD. Pt expressed good understanding. Discussed with pt that she will be followed by an RD at the outpatient HD facility.  ? ?Plan for AV fistula or graft on Thursday with Vascular Surgery team. ? ?Medications reviewed and include: Colace, SSI 0-5 units daily + 0-6 TID, Rena-Vit, Beneprotein, IV ferric gluconate ?Labs reviewed: Sodium  132, Phosphorus 5.7, 24 CBG 163-284 ? ?HD on 5/8 ?Net UF: 1025 mL ?Post-Dialysis Weight: 88.9 kg ? ?Diet Order:   ?Diet Order   ? ?       ?  Diet Carb Modified Fluid consistency: Thin; Room service appropriate? Yes; Fluid restriction: 1200 mL Fluid  Diet effective now       ?  ? ?  ?  ? ?  ? ?EDUCATION NEEDS:  ? ?Education needs have been addressed ? ?Skin:  Skin Assessment: Skin Integrity Issues: ?Skin Integrity Issues:: Stage II, Incisions ?Stage II: sacrum ?Incisions: R leg, R neck ? ?Last BM:  5/8 - Type 7 ? ?Height:  ? ?Ht Readings from Last 1 Encounters:  ?09/25/21 5\' 7"  (1.702 m)  ? ? ?Weight:  ? ?Wt Readings from Last 1 Encounters:  ?10/01/21 88.3 kg  ? ? ?Ideal Body Weight:  61.4 kg ? ?BMI:  Body mass index is 30.49 kg/m?. ? ?Estimated Nutritional Needs:  ? ?Kcal:  1900-2100 ? ?Protein:  95-110 grams ? ?Fluid:  UOP + 1L ? ?Jessica Mercado RD, LDN ?Clinical Dietitian ?See AMiON for contact information.  ? ?

## 2021-10-01 NOTE — Progress Notes (Signed)
Patient ID: Jessica Mercado, female   DOB: March 15, 1961, 61 y.o.   MRN: [id number] ? ?[facility] ?Progress Note  ? ?Assessment/ Plan:   ?1. Falls/syncope/left trimalleolar ankle fracture: The fall and syncope was likely related to worsening uremia and she is undergoing evaluation by orthopedic surgery for management of her trimalleolar ankle fracture.  Admitted to inpatient rehabilitation for continued strengthening to facilitate eventual discharge home. ? ?2. 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  ?- HD per MWF schedule  ?- renal panel in AM ?- Note she will have a MWF schedule upon discharge at [facility] ?- s/p vein mapping ?- consult VVS for AVF for new ESRD patient - previously was planned for outpatient follow-up per charting however was just discharged to rehab, not to home.  Per rehab, her mobility is still questionable as is transportation so would recommend going ahead with permanent access placement while in-house ? ?3. 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  Increased aranesp to 60 mcg weekly on mondays.  Status post intravenous iron. ? 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 ? ?5. Hypertension: Blood pressures controlled on current regimen.now off of prazosin as BP was dropping a bit with HD.    ? ?Dispo - spoke with HD SW - dispo meetings are on Wednesdays and she will let us know if there are any updates.  Spoke with rehab team today and they are fine with [place] getting an AVF while here.  ? ? ?Subjective:   ?Last HD on 5/8 with 1.0 kg UF.  She had 1 unmeasured urine void over 5/8.  Per team and PT she has poor health literacy.  Her son in law isn't coming in for teaching as difficulty with transportation.  They are worried about her needing to be downgraded to a  wheelchair status.   ? ?Review of systems:   ?Denies n/v ?Denies chest pain or shortness of breath  ? ?Objective:   ?BP 122/65 (BP Location: Left Arm)   Pulse 73   Temp 98.7 ?F (37.1 ?C) (Oral)   Resp 17   Ht 5\' 7"  (1.702 m)   Wt 88.3 kg   LMP  (LMP Unknown)   SpO2 96%   BMI 30.49 kg/m?  ? ?Physical Exam:   ?General adult female in bed in no acute distress ?HEENT normocephalic atraumatic extraocular movements intact sclera anicteric ?Neck supple trachea midline ?Lungs clear to auscultation bilaterally normal work of breathing at rest  ?Heart S1S2 no rub ?Abdomen soft nontender nondistended ?Extremities no edema; left foot in boot ?Psych normal mood and affect ?Access tunneled HD catheter in place ? ?Labs: ?BMET ?Recent Labs  ?Lab 09/24/21 ?1217 09/25/21 ?1736 09/27/21 ?1621 09/30/21 ?1231  ?NA 134* 135 136 132*  ?K 3.9 3.4* 3.7 4.0  ?CL 99 99 98 98  ?CO2  --  26 28 24   ?GLUCOSE 168* 209* 155* 268*  ?BUN 21* 10 10 43*  ?CREATININE 4.10* 2.31* 2.26* 6.40*  ?CALCIUM  --  8.5* 8.1* 8.3*  ?PHOS  --  2.1* 1.5* 5.7*  ? ?CBC ?Recent Labs  ?Lab 09/25/21 ?1601 09/25/21 ?1736 09/27/21 ?1621 09/30/21 ?1231  ?WBC 12.4* 13.5* 13.4* 8.4  ?HGB 9.1* 9.1* 8.9* 9.1*  ?HCT 27.3* 26.9* 27.6* 28.7*  ?MCV 89.8 90.0 92.6 95.0  ?PLT 228 235 195 192  ? ? ?  ?  Medications:   ? ? (feeding supplement) PROSource Plus  30 mL Oral BID WC  ? acetaminophen  500 mg Oral Q12H  ? atorvastatin  40 mg Oral Q supper  ? benztropine  1 mg Oral QPM  ? Chlorhexidine Gluconate Cloth  6 each Topical BID  ? darbepoetin (ARANESP) injection - DIALYSIS  60 mcg Intravenous Q Mon-HD  ? docusate sodium  100 mg Oral Daily  ? enoxaparin (LOVENOX) injection  30 mg Subcutaneous Q24H  ? FLUoxetine  60 mg Oral q morning  ? gabapentin  100 mg Oral Daily  ? insulin aspart  0-5 Units Subcutaneous QHS  ? insulin aspart  0-6 Units Subcutaneous TID WC  ? lurasidone  40 mg Oral QHS  ? metoprolol succinate  50 mg Oral Daily  ? multivitamin  1 tablet Oral QHS  ? protein  supplement  1 Scoop Oral TID WC  ? Semaglutide (1 MG/DOSE)  1.3 mg Subcutaneous Q Mon  ? traZODone  300 mg Oral QHS  ? ?Claudia Desanctis, MD ?10/01/2021, 11:46 AM ? ? ?

## 2021-10-01 NOTE — Plan of Care (Signed)
?  Problem: RH Balance ?Goal: LTG Patient will maintain dynamic standing with ADLs (OT) ?Description: LTG:  Patient will maintain dynamic standing balance with assist during activities of daily living (OT)  ?Outcome: Not Applicable ?Flowsheets (Taken 10/01/2021 1525) ?LTG: Pt will maintain dynamic standing balance during ADLs with: (d/c) -- ?Note: D/c goal 2/2 pt planning to be at w/c and seated level for functional transfers and tasks as a result of increased pain and poor WB adherence ?  ?Problem: Sit to Stand ?Goal: LTG:  Patient will perform sit to stand in prep for activites of daily living with assistance level (OT) ?Description: LTG:  Patient will perform sit to stand in prep for activites of daily living with assistance level (OT) ?Outcome: Not Applicable ?Flowsheets (Taken 10/01/2021 1525) ?LTG: PT will perform sit to stand in prep for activites of daily living with assistance level: (d/c) -- ?Note: D/c goal 2/2 pt planning to be at w/c and seated level for functional transfers and tasks as a result of increased pain and poor WB adherence ?  ?Problem: RH Dressing ?Goal: LTG Patient will perform lower body dressing w/assist (OT) ?Description: LTG: Patient will perform lower body dressing with assist, with/without cues in positioning using equipment (OT) ?Flowsheets (Taken 10/01/2021 1525) ?LTG: Pt will perform lower body dressing with assistance level of: (downgraded) Supervision/Verbal cueing ?Note: Downgraded 2/2 poor WB adherence and plan to d/c pt at w/c level for seated ADLs and transfers ?  ?Problem: RH Toileting ?Goal: LTG Patient will perform toileting task (3/3 steps) with assistance level (OT) ?Description: LTG: Patient will perform toileting task (3/3 steps) with assistance level (OT)  ?Flowsheets (Taken 10/01/2021 1525) ?LTG: Pt will perform toileting task (3/3 steps) with assistance level: (downgraded) Supervision/Verbal cueing ?Note: Downgraded 2/2 poor WB adherence and plan to d/c pt at w/c level for  seated ADLs and transfers ?  ?Problem: RH Simple Meal Prep ?Goal: LTG Patient will perform simple meal prep w/assist (OT) ?Description: LTG: Patient will perform simple meal prep with assistance, with/without cues (OT). ?Flowsheets (Taken 10/01/2021 1525) ?LTG: Pt will perform simple meal prep with assistance level of: (downgraded) Supervision/Verbal cueing ?LTG: Pt will perform simple meal prep w/level of: Wheelchair level ?Note: Downgraded 2/2 poor WB adherence and plan to d/c pt at w/c level for seated ADLs and transfers ?  ?Problem: RH Toilet Transfers ?Goal: LTG Patient will perform toilet transfers w/assist (OT) ?Description: LTG: Patient will perform toilet transfers with assist, with/without cues using equipment (OT) ?Flowsheets (Taken 10/01/2021 1525) ?LTG: Pt will perform toilet transfers with assistance level of: (downgraded) Contact Guard/Touching assist ?Note: Downgraded 2/2 poor WB adherence and plan to d/c pt at w/c level for seated ADLs and transfers ?  ?Problem: RH Tub/Shower Transfers ?Goal: LTG Patient will perform tub/shower transfers w/assist (OT) ?Description: LTG: Patient will perform tub/shower transfers with assist, with/without cues using equipment (OT) ?Flowsheets (Taken 10/01/2021 1525) ?LTG: Pt will perform tub/shower stall transfers with assistance level of: (downgraded) Contact Guard/Touching assist ?Note: Downgraded 2/2 poor WB adherence and plan to d/c pt at w/c level for seated ADLs and transfers ?  ?

## 2021-10-01 NOTE — Progress Notes (Signed)
Occupational Therapy Session Note ? ?Patient Details  ?Name: Jessica Mercado ?MRN: [id number] ?Date of Birth: 02-11-1961 ? ?Today's Date: 10/01/2021 ?OT Individual Time: 0904-1000 & 1400-1505 ?OT Individual Time Calculation (min): 56 min  & 50 min ?Missed OT time: 25 min ?Missed time reason: fatigue ? ? ?Short Term Goals: ?Week 1:  OT Short Term Goal 1 (Week 1): Patient will be able to transfer to [facility] with mod a and cues ?OT Short Term Goal 2 (Week 1): Patient will  demonstrate ability to stand long enough to pull up/down LB clothing with min assist ?OT Short Term Goal 3 (Week 1): Patient will maintain LLE NWB status with functional mobility tasks ?OT Short Term Goal 4 (Week 1): Patient will tolerate upper body strengthening exercises to allow increased use of UE  on walker for functioanl mobility ? ?Skilled Therapeutic Interventions/Progress Updates:  ?Session 1 ?Skilled OT intervention completed with focus on ADL retraining, activity tolerance, WB adherence. Pt received upright in bed, no c/o pain, agreeable to session. Completed bed mobility with supervision and use of bed rails. Max A squat pivot/lateral scoot > L with safety cues needed as pt panics halfway through stating "I'm gonna fall" and freezes with max encouragement needed to continue. Poor adherence to WB precautions throughout all transfers with physical assist needed to lift leg off ground (blocking the heel does not help). Consistent cues and education needed throughout session for safety and motor planning. MD in room for rounds with therapist engaging in conversation about pt's CLOF and goals, with plan to modify goals to w/c level 2/2 poor WB adherence, however advocating for family education session prior to d/c early to ensure that family can assist at her 56. Pt with decreased insight to her deficits. ? ?Seated at sink, pt was able to wash/dress UB with supervision/cues for initiation, and wash LB with LH sponge with supervision excluding  pericare. LB dressing completed with use of reacher, with min A needed for technique to thread over BLEs and for wound vac management. Sit > stand from w/c with mod A using RW with pt needing 3 trials to officially come up. One step commands needed for safety and minimizing fear with being in stance. Pt demonstrating self-limiting behaviors. Total A needed for donning pants over hips. Pt was left seated in w/c with chair alarm on, BLE elevated and all needs in reach at end of session. ? ?Session 2 ?Skilled OT intervention completed with focus on toilet transfers, and toileting steps. Pt received seated in w/c, c/o unrated pain in buttocks from sitting for extended period of time- provided repositioning and lateral leans for pressure/pain/relief. With education provided for technique, pt was able to slideboard transfer > R side to DABBSC with min A and cues for safety, positioning and LLE WB adherence. While seated on commode, pt was able to demonstrate retrieving 6 wash cloths that were tucked into her pants waist and under her bottom via lateral leans to simulate seated pericare needed for toileting. Education provided on task however pt stating "I know how to wipe my butt!" however with poor insight to deficits as she had difficulty with retrieving wash cloths that were at buttock region. Would benefit from further lateral lean strategies. Slideboard transfer with total A for board placement > L in w/c with mod A and safety cues, then slideboard > R back to bed at min A and same cues needed. Pt reported fatigue with request to lay down. Retrieved gown vs shirt per request,  with pt able to doff/donn with supervision. Bed mobility completed with supervision and increased time, with HOB flat. Elevated BLEs for edema management, with pt left upright in bed, with bed alarm on and all needs in reach at end of session. ? ? ? ?Therapy Documentation ?Precautions:  ?Precautions ?Precautions: Fall ?Precaution Comments: wound  vac L LE ?Required Braces or Orthoses: Splint/Cast ?Splint/Cast - Date Prophylactic Dressing Applied (if applicable): 37/90/24 ?Restrictions ?Weight Bearing Restrictions: Yes ?RLE Weight Bearing: Weight bearing as tolerated ?LLE Weight Bearing: Non weight bearing ? ? ? ? ?Therapy/Group: Individual Therapy ? ?Joselyn Edling E Dasie Chancellor ?10/01/2021, 7:30 AM ?

## 2021-10-01 NOTE — Consult Note (Addendum)
[facility] ? ?VASCULAR SURGERY ASSESSMENT & PLAN:  ? ?END-STAGE RENAL DISEASE: She has a functioning tunneled dialysis catheter.  We have been asked to place new access.  Her vein map shows that her only option for a fistula on the left would be a basilic vein transposition that would likely be done in 2 stages.  She has an IV in her left arm and I will have the move that to her right arm.  She dialyzes Monday Wednesdays and Fridays.  I am going to try to get her onto the schedule for Thursday for a left AV fistula or AV graft. ? ?Gae Gallop, MD ?2:46 PM ? ? ?HPI: 61 y/o female with history of CKD now on HD via [place].  Dr. Rich Reining did an exchange [facility] 09/24/21.  We are now asked to provide permanent  access.  Vein mapping was done on 09/17/21.  She is right hand dominant and has no chest implants other than current right TDC. ? ?Subjective  - In Rehab s/p ankle ORIF for fracture. ? ?Objective ?122/65 ?73 ?98.7 ?F (37.1 ?C) (Oral) ?17 ?96% ? ?Intake/Output Summary (Last 24 hours) at 10/01/2021 1153 ?Last data filed at 10/01/2021 3716 ?Gross per 24 hour  ?Intake 350 ml  ?Output 1025 ml  ?Net -675 ml  ? ?B LE with palpable radial pulses ?Lungs non labored breathing ?Left LE non weight bearing ?Heart RRR ? ?VEIN MAP: I have reviewed her bilateral upper extremity vein map that was done on 09/17/2021. ? ?On the right side the upper arm cephalic vein looks reasonable in size however it narrows down to 1.7 mm in the upper arm.  The basilic vein does not appear to be adequate on the right. ? ?On the left side, diameters of the cephalic vein ranged from 1.1-2.4 mm.  This does not appear to be adequate for an AV fistula.  The diameters of the basilic vein on the left range from 2.6-4.5 mm.  She might be a candidate for a basilic vein transposition. ? ?Assessment/Planning: ?ESRD on HD MWF via right TDC exchanged by Dr. Scot Dock  ? ?Pending vein mapping plan for left UE AV fistula verse graft when  schedule permits this week. ?Dr. Scot Dock will see the patient later today. ? ?Roxy Horseman ?10/01/2021 ?11:53 AM ?-- ? ?Laboratory ?Lab Results: ?Recent Labs  ?  09/30/21 ?1231  ?WBC 8.4  ?HGB 9.1*  ?HCT 28.7*  ?PLT 192  ? ?BMET ?Recent Labs  ?  09/30/21 ?1231  ?NA 132*  ?K 4.0  ?CL 98  ?CO2 24  ?GLUCOSE 268*  ?BUN 43*  ?CREATININE 6.40*  ?CALCIUM 8.3*  ? ? ?COAG ?Lab Results  ?Component Value Date  ? INR 1.1 09/16/2021  ? ?No results found for: PTT ? ? ? ?

## 2021-10-02 LAB — RENAL FUNCTION PANEL
Albumin: 2.5 g/dL — ABNORMAL LOW (ref 3.5–5.0)
Anion gap: 5 (ref 5–15)
BUN: 26 mg/dL — ABNORMAL HIGH (ref 6–20)
CO2: 26 mmol/L (ref 22–32)
Calcium: 8.1 mg/dL — ABNORMAL LOW (ref 8.9–10.3)
Chloride: 100 mmol/L (ref 98–111)
Creatinine, Ser: 4.66 mg/dL — ABNORMAL HIGH (ref 0.44–1.00)
GFR, Estimated: 10 mL/min — ABNORMAL LOW (ref >60)
Glucose, Bld: 147 mg/dL — ABNORMAL HIGH (ref 70–99)
Phosphorus: 4.2 mg/dL (ref 2.5–4.6)
Potassium: 3.8 mmol/L (ref 3.5–5.1)
Sodium: 131 mmol/L — ABNORMAL LOW (ref 135–145)

## 2021-10-02 LAB — HEPATITIS B SURFACE ANTIBODY, QUANTITATIVE: Hep B S AB Quant (Post): <3.1 m[IU]/mL — ABNORMAL LOW (ref >9.9)

## 2021-10-02 LAB — GLUCOSE, CAPILLARY
Glucose-Capillary: 141 mg/dL — ABNORMAL HIGH (ref 70–99)
Glucose-Capillary: 248 mg/dL — ABNORMAL HIGH (ref 70–99)
Glucose-Capillary: 173 mg/dL — ABNORMAL HIGH (ref 70–99)
Glucose-Capillary: 217 mg/dL — ABNORMAL HIGH (ref 70–99)

## 2021-10-02 MED ORDER — SEMAGLUTIDE (1 MG/DOSE) 4 MG/3ML ~~LOC~~ SOPN
1.2500 mg | PEN_INJECTOR | SUBCUTANEOUS | Status: DC
Start: 2021-10-08 — End: 2021-10-02

## 2021-10-02 MED ORDER — HEPARIN SODIUM (PORCINE) 1000 UNIT/ML IJ SOLN
INTRAMUSCULAR | Status: AC
Start: 2021-10-02 — End: 2021-10-03
  Filled 2021-10-02: qty 5

## 2021-10-02 MED ORDER — SEMAGLUTIDE (1 MG/DOSE) 4 MG/3ML ~~LOC~~ SOPN: 1.0000 mg | PEN_INJECTOR | SUBCUTANEOUS | Status: DC

## 2021-10-02 MED ORDER — PRAZOSIN HCL 1 MG PO CAPS
1.0000 mg | ORAL_CAPSULE | Freq: Every day | ORAL | Status: DC
  Administered 2021-10-02 – 2021-10-09 (×8): 1 mg via ORAL
  Filled 2021-10-02 (×9): qty 1

## 2021-10-02 NOTE — Progress Notes (Signed)
Occupational Therapy Session Note ? ?Patient Details  ?Name: Jessica Mercado ?MRN: [id number] ?Date of Birth: 11-Jan-1961 ? ? ?Today's Date: 10/02/2021 ?OT Individual Time: 4315-4008 ?OT Individual Time Calculation (min): 68 min  ? ?Short Term Goals: ?Week 1:  OT Short Term Goal 1 (Week 1): Patient will be able to transfer to [facility] with mod a and cues ?OT Short Term Goal 2 (Week 1): Patient will  demonstrate ability to stand long enough to pull up/down LB clothing with min assist ?OT Short Term Goal 3 (Week 1): Patient will maintain LLE NWB status with functional mobility tasks ?OT Short Term Goal 4 (Week 1): Patient will tolerate upper body strengthening exercises to allow increased use of UE  on walker for functioanl mobility ? ?Skilled Therapeutic Interventions/Progress Updates:  ?Skilled OT intervention completed with focus on slideboard transfers, lateral leans in prep for toileting and pericare. Pt received seated in w/c, no c/o pain, agreeable to session. Self-propelled in w/c with supervision about 20 ft with max encouragement to prep for w/c mobility needed for home management. Cues needed for spacing and avoiding obstacles. Slide board transfer > R with mod A and physical assist for NWB on LLE. Cues needed throughout for safety and positioning.  ? ?Seated EOM, with block under BLE for support 2/2 body habitus, pt was able to demonstrate donning/doffing theraband around waist/under bottom to promote lateral leans and technique needed for seated toileting. CGA needed for leans, tactile cues for WB onto elbows for increased access of bottom, and up to min A needed for lifting each leg 2/2 self-liming behaviors, up to min A for pulling band when doffing as well. Education provided on purpose of activity as pt stated "I'll just stand to pull pants up." Reinforced w/c level at d/c goals, with safety implications and inadequate assist for that method at home. Pt required semi-supine rest break with c/o fatigue stating  "I'm laying back I'm dizzy." Back support from bolster and pillows provided, with resolved dizziness upon rest, with pt reporting she feels this way normally when she's tired. ? ?Seated EOM, pt completed the following BUE exercises x15, to promote strength needed for functional tasks: ?4 pound dowel  ?2x15 bicep flexion ?2x15 chest presses ? ?Max encouragement needed for participation as pt reported fatigue. Cues needed for form. Slideboard transfer > R from EOM to w/c with use of foot block under R foot and therapist holding LLE from ground, with pt demonstrating improved light CGA transfer to w/c. Transported pt with total A back to room 2/2 fatigue, with pt left upright in w/c, chair alarm on and all needs met at end of session. ? ? ?Therapy Documentation ?Precautions:  ?Precautions ?Precautions: Fall ?Precaution Comments: wound vac L LE ?Required Braces or Orthoses: Splint/Cast ?Splint/Cast - Date Prophylactic Dressing Applied (if applicable): 67/61/95 ?Restrictions ?Weight Bearing Restrictions: Yes ?RLE Weight Bearing: Weight bearing as tolerated ?LLE Weight Bearing: Non weight bearing ? ? ? ? ? ?Therapy/Group: Individual Therapy ? ?[place] ?10/02/2021, 7:34 AM ?

## 2021-10-02 NOTE — Progress Notes (Signed)
?                                                       PROGRESS NOTE ? ? ?Subjective/Complaints: ?No new complaints this morning ?Asks when wound vac will be removed, discussed with nursing that wound vac will be removed by ortho today ? ?ROS: Patient denies fever, rash, sore throat, blurred vision, dizziness, nausea, vomiting, diarrhea, cough, shortness of breath or chest pain, joint or back/neck pain, headache, or mood change, pain. +constipation, +anuria ? ?Objective: ?  ?No results found. ? ?Recent Labs  ?  09/30/21 ?1231  ?WBC 8.4  ?HGB 9.1*  ?HCT 28.7*  ?PLT 192  ? ?Recent Labs  ?  09/30/21 ?1231 10/02/21 ?1610  ?NA 132* 131*  ?K 4.0 3.8  ?CL 98 100  ?CO2 24 26  ?GLUCOSE 268* 147*  ?BUN 43* 26*  ?CREATININE 6.40* 4.66*  ?CALCIUM 8.3* 8.1*  ? ? ?Intake/Output Summary (Last 24 hours) at 10/02/2021 1005 ?Last data filed at 10/02/2021 0802 ?Gross per 24 hour  ?Intake 240 ml  ?Output --  ?Net 240 ml  ?  ? ?Pressure Injury 09/25/21 Sacrum Medial;Upper Stage 2 -  Partial thickness loss of dermis presenting as a shallow open injury with a red, pink wound bed without slough. 1 mm x 86mm (Active)  ?09/25/21 2249  ?Location: Sacrum  ?Location Orientation: Medial;Upper  ?Staging: Stage 2 -  Partial thickness loss of dermis presenting as a shallow open injury with a red, pink wound bed without slough.  ?Wound Description (Comments): 1 mm x 72mm (Staged by DARA, RN)  ?Present on Admission: Yes  ? ? ?Physical Exam: ?Vital Signs ?Blood pressure (!) 154/82, pulse 66, temperature 97.6 ?F (36.4 ?C), resp. rate 17, height 5\' 7"  (1.702 m), weight 94.1 kg, SpO2 98 %. ?Constitutional: No distress . Vital signs reviewed. BMI 32.49 ?HEENT: NCAT, EOMI, oral membranes moist ?Neck: supple ?Cardiovascular: RRR without murmur. No JVD    ?Respiratory/Chest: CTA Bilaterally without wheezes or rales. Normal effort    ?GI/Abdomen: BS +, non-tender, non-distended ?Ext: no clubbing, cyanosis, or edema ?Psych: flat but cooperative  ?Skin: right IJ  TDC, left foot with vac, sacral wound stage 2 ?Neuro/MSK: Alert. Lef foot in ace bandage with wound vac- strength not tested given nonweightbearing status. Impaired problem solving, delayed in general.  ? ?Assessment/Plan: ?1. Functional deficits which require 3+ hours per day of interdisciplinary therapy in a comprehensive inpatient rehab setting. ?Physiatrist is providing close team supervision and 24 hour management of active medical problems listed below. ?Physiatrist and rehab team continue to assess barriers to discharge/monitor patient progress toward functional and medical goals ? ?Care Tool: ? ?Bathing ?   ?Body parts bathed by patient: Right arm, Left arm, Chest, Abdomen, Right upper leg, Left upper leg, Right lower leg, Face  ? Body parts bathed by helper: Buttocks, Right lower leg ?Body parts n/a: Left lower leg (cast) ?  ?Bathing assist Assist Level: Supervision/Verbal cueing ?  ?  ?Upper Body Dressing/Undressing ?Upper body dressing   ?What is the patient wearing?: Pull over shirt ?   ?Upper body assist Assist Level: Supervision/Verbal cueing ?   ?Lower Body Dressing/Undressing ?Lower body dressing ? ? ? Lower body dressing activity did not occur: Safety/medical concerns (Wound vac temp disconnected by nursing staff) ?What is the patient wearing?:  Pants ? ?  ? ?Lower body assist Assist for lower body dressing: Moderate Assistance - Patient 50 - 74% ?   ? ?Toileting ?Toileting    ?Toileting assist Assist for toileting: Maximal Assistance - Patient 25 - 49% ?  ?  ?Transfers ?Chair/bed transfer ? ?Transfers assist ? Chair/bed transfer activity did not occur: Refused ? ?Chair/bed transfer assist level: Minimal Assistance - Patient > 75% (slideboard) ?  ?  ?Locomotion ?Ambulation ? ? ?Ambulation assist ? ? Ambulation activity did not occur: Refused ? ?  ?  ?   ? ?Walk 10 feet activity ? ? ?Assist ? Walk 10 feet activity did not occur: Refused ? ?  ?   ? ?Walk 50 feet activity ? ? ?Assist Walk 50 feet with 2  turns activity did not occur: Safety/medical concerns ? ?  ?   ? ? ?Walk 150 feet activity ? ? ?Assist Walk 150 feet activity did not occur: Safety/medical concerns ? ?  ?  ?  ? ?Walk 10 feet on uneven surface  ?activity ? ? ?Assist Walk 10 feet on uneven surfaces activity did not occur: Safety/medical concerns ? ? ?  ?   ? ?Wheelchair ? ? ? ? ?Assist Is the patient using a wheelchair?: Yes ?Type of Wheelchair: Manual ?Wheelchair activity did not occur: Refused ? ?Wheelchair assist level: Supervision/Verbal cueing ?Max wheelchair distance: 108ft  ? ? ?Wheelchair 50 feet with 2 turns activity ? ? ? ?Assist ? ?  ?Wheelchair 50 feet with 2 turns activity did not occur: Refused ? ? ?   ? ?Wheelchair 150 feet activity  ? ? ? ?Assist ? Wheelchair 150 feet activity did not occur: Refused ? ? ?   ? ?Blood pressure (!) 154/82, pulse 66, temperature 97.6 ?F (36.4 ?C), resp. rate 17, height 5\' 7"  (1.702 m), weight 94.1 kg, SpO2 98 %. ? ? ? Medical Problem List and Plan: ?1. Functional deficits secondary to left trimalleolar ankle fracture ?            -patient may not shower until wound vac removal. ?            -ELOS/Goals: 14-16 days mod I ?           -Continue CIR therapies including PT, OT  ? -messaged SW to schedule caregiver training with son.  ? -Interdisciplinary Team Conference today   ?2.  Impaired mobility, nonambulatory:  continue Lovenox.  ?3. Postoperative pain: Tylenol BID. Discontinue oxycodone. Discussed with patient. ?4. Bipolar disorder?: continue Latuda ?5. Neuropsych: This patient is capable of making decisions on her own behalf. ?6. Left lower extremity trimalleolar fracture: wound avc to be removed Wednesday 5/10 by ortho, discussed with nursing and patient ?7. Obesity: BMI 32.32 provide dietary education. Restart Semaglutide. 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 HTN: Monitor BP TID ?11. T2DM: Hgb A1C- 5.7 (down from 6.6  a couple of  months ago)--was on glucotrol and ozempic  PTA ?            --CBG (last 3)  ?Recent Labs  ?  10/01/21 ?1621 10/01/21 ?2109 10/02/21 ?6789  ?GLUCAP 247* 206* 141*  ?   -  ssi ? 5/7-restart Semaglutide.--first dose 09/30/21 from home. Increase Semglutide to 1.25mg  ? -cover with ssi for now ? -change prosource to with meals.  ?12. Bipolar disorder: On Latuda, Trazodone and Prozac.  ?--Parkinsonism/EPS  managed with benztropine.  ?-might benefit from neuro-psych eval ?13. Anemia of chronic disease: continue to  monitor Hgb with HD labs.  ?14. Screening for vitamin D deficiency: Vitamin D, 25 hydroxyl-: Level high normal @ 93.97. Does not need supplementation ?15. Hyponatremia: normalized, continue to monitor Na with HD labs ?16. Anuric: discussed relation to uremia, hemodialysis fluid restriction ? -no pvr's, making intermittent urine only ?17. Poor po intake ? 5/7 -reached out nephro. [place] for reg CM diet ?18. Constipation: add colace daily.  ?19. Obesity BMI 32.42: change prosource to with meals.  ?20. Insomnia: continue trazodone ? ?LOS: ?7 days ?A FACE TO FACE EVALUATION WAS PERFORMED ? ?Martha Clan P Kale Rondeau ?10/02/2021, 10:05 AM  ? ?  ?

## 2021-10-02 NOTE — Progress Notes (Signed)
Patient ID: Jessica Mercado, female   DOB: [birth year], 61 y.o.   MRN: [id number] ? ?[facility] ?Progress Note  ? ?Assessment/ Plan:   ?1. Falls/syncope/left trimalleolar ankle fracture: The fall and syncope was likely related to worsening uremia and she is undergoing evaluation by orthopedic surgery for management of her trimalleolar ankle fracture.  Admitted to inpatient rehabilitation for continued strengthening to facilitate eventual discharge home. ? ?2. 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  ?- HD per MWF schedule  ?- Note she will have a MWF schedule upon discharge at [facility] ?- s/p vein mapping ?- consulted [facility] for AVF - this is being placed on 5/11.  (We have no plans for HD on 5/11) ? ?3. 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  Increased aranesp to 60 mcg weekly on mondays.  Status post intravenous iron. ? 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 No PTH on file. Will check.  ? ?5. Hypertension: add back prazosin  ? ?Dispo - spoke with HD SW - she will let us know if there are any updates.   ? ? ?Subjective:   ?Seen and examined on dialysis.  Procedure supervised.  Blood pressure 161/74 and HR 71.  RIJ tunn catheter in use.  Tolerating goal.    ? ?Review of systems:   ?Denies n/v ?Denies chest pain or shortness of breath  ? ?Objective:   ?BP (!) 161/74   Pulse 66   Temp 97.6 ?F (36.4 ?C)   Resp 18   Ht 5\' 7"  (1.702 m)   Wt 87.2 kg   LMP  (LMP Unknown)   SpO2 98%   BMI 30.11 kg/m?  ? ?Physical Exam:   ?General adult female in bed in no acute distress ?HEENT normocephalic atraumatic extraocular movements intact sclera anicteric ?Neck supple trachea midline ?Lungs clear to auscultation bilaterally normal work of breathing at rest  ?Heart S1S2 no rub ?Abdomen soft nontender nondistended ?Extremities  trace edema; left foot in boot ?Psych normal mood and affect ?Access tunneled HD catheter in place ? ?Labs: ?BMET ?Recent Labs  ?Lab 09/25/21 ?1736 09/27/21 ?1621 09/30/21 ?1231 10/02/21 ?2992  ?NA 135 136 132* 131*  ?K 3.4* 3.7 4.0 3.8  ?CL 99 98 98 100  ?CO2 26 28 24 26   ?GLUCOSE 209* 155* 268* 147*  ?BUN 10 10 43* 26*  ?CREATININE 2.31* 2.26* 6.40* 4.66*  ?CALCIUM 8.5* 8.1* 8.3* 8.1*  ?PHOS 2.1* 1.5* 5.7* 4.2  ? ?CBC ?Recent Labs  ?Lab 09/25/21 ?1736 09/27/21 ?1621 09/30/21 ?1231  ?WBC 13.5* 13.4* 8.4  ?HGB 9.1* 8.9* 9.1*  ?HCT 26.9* 27.6* 28.7*  ?MCV 90.0 92.6 95.0  ?PLT 235 195 192  ? ? ?  ?Medications:   ? ? (feeding supplement) PROSource Plus  30 mL Oral BID WC  ? acetaminophen  500 mg Oral Q12H  ? atorvastatin  40 mg Oral Q supper  ? benztropine  1 mg Oral QPM  ? Chlorhexidine Gluconate Cloth  6 each Topical BID  ? Chlorhexidine Gluconate Cloth  6 each Topical Q0600  ? darbepoetin (ARANESP) injection - DIALYSIS  60 mcg Intravenous Q Mon-HD  ? docusate sodium  100 mg Oral Daily  ? enoxaparin (LOVENOX) injection  30 mg Subcutaneous Q24H  ? FLUoxetine  60 mg Oral q morning  ? gabapentin  100 mg Oral Daily  ? insulin aspart  0-5 Units Subcutaneous QHS  ? insulin aspart  0-6 Units Subcutaneous TID WC  ? lurasidone  40 mg Oral QHS  ? metoprolol succinate  50 mg Oral Daily  ? multivitamin  1 tablet Oral QHS  ? protein supplement  1 Scoop Oral TID WC  ? [START ON 10/08/2021] Semaglutide (1 MG/DOSE)  1 mg Subcutaneous Weekly  ? traZODone  300 mg Oral QHS  ? ?Claudia Desanctis, MD ?10/02/2021, 1:41 PM ? ? ? ? ?

## 2021-10-02 NOTE — Progress Notes (Signed)
Physical Therapy Session Note ? ?Patient Details  ?Name: Jessica Mercado ?MRN: [id number] ?Date of Birth: May 04, 1961 ? ?Today's Date: 10/02/2021 ?PT Individual Time: 9794-8016 + 5537-4827 ?PT Individual Time Calculation (min): 73 min  + 24 min ? ?Short Term Goals: ?Week 1:  PT Short Term Goal 1 (Week 1): Patient will adhere to weight bearing precautions with all mobility at least 50% of the time ?PT Short Term Goal 2 (Week 1): Patient will transfer bed <> wc with LRAD and ModA ?PT Short Term Goal 3 (Week 1): Patient will complete sit <> stand with LRAD and CGA while maintaining wbng precautions ?PT Short Term Goal 4 (Week 1): Patient will initiate wc mob ? ?Skilled Therapeutic Interventions/Progress Updates:  ?   ? ?1st session: ?Pt presenting supine in bed - agreeable to PT tx. Reports 0/10 pain. ? ? Donned athletic shorts bed level with assist for threading. Pt pulling shorts over hips via bridging technique but would place weight through her LLE despite instruction not to. Supine<>sitting EOB with supervision with HOB minimally elevated. Sitting balance with distant supervision. SB transfer completed with minA for scooting and totalA for board placement. Pt continuing to place weight through her L heel during SB transfer despite instruction not to. Pt reporting "I know i'm not supposed to put weight through my leg, but it's hard not too." Educated her on reasoning behind NWB restrictions to ensure bone healing post surgery. Pt wheeled sinkside to complete oral care. Retrieved mouthrinse and soft brush as pt does not have her dentures in the hospital. Educated on importance of oral hygiene to prevent infection. Oral care completed with setupA.  ? ?Instructed in w/c propulsion using BUE to propel. She propelled herself from her room to main rehab gym, totaling 152ft - supervision for safety and x3 rest breaks required. Speed slowed with decreased efficiency, limited shoulder extension in propelling. ? ?Completed  blocked practice sliding board transfers on level surfaces from w/c to mat table. Placed 4inch platform under her LLE to assist with pushing. She required totalA for SB placement. Completed x5 total SB transfers and 4/5 transfers she was compliant with NWB status with therapist holding her LLE elevated. Otherwise, pt was able to complete without physical assist going both L/R directions. Pt reports her son can provide this level of assist at home. ? ?Instructed in w/c propulsion back to her room, able to complete ~53ft prior to fatigue. MD arriving for morning rounding and we discussed functional progress.  ? ?Transported remaining distance back to her room. Concluded session seated in w/c with wound vac plugged in, call bell in lap, all needs met.  ? ?2nd session: ?Pt received sitting in w/c - agreeable to PT tx. Denies pain. Transported in w/c to ortho rehab gym for time. ? ? Setup on UE ergometer with resistance set to 4.5. Completed 3 minutes forward + 3 minutes backwards with 2 minute rest break b/w sets. UE ergometer targeting cardiovascular endurance and BUE strengthening to assist with carryover into functional SB transfers.  ? ?Returned to her room and assisted back to bed via SB transfer - totalA for board placement and CGA for transfer - pt compliant with NWB status to her LLE during transfer, utilizing UE more consistently to push her hips up during transfer. Bed mobility completed mod I without hospital bed features. Concluded session supine in bed with bed alarm on, call bell in lap, all needs met.   ? ?Therapy Documentation ?Precautions:  ?Precautions ?Precautions: Fall ?Precaution  Comments: wound vac L LE ?Required Braces or Orthoses: Splint/Cast ?Splint/Cast - Date Prophylactic Dressing Applied (if applicable): 63/33/54 ?Restrictions ?Weight Bearing Restrictions: Yes ?RLE Weight Bearing: Weight bearing as tolerated ?LLE Weight Bearing: Non weight bearing ?General: ?  ? ?Therapy/Group: Individual  Therapy ? ?Alger Simons PT ?10/02/2021, 7:38 AM  ?

## 2021-10-02 NOTE — Progress Notes (Signed)
? ?  VASCULAR SURGERY ASSESSMENT & PLAN:  ? ?END-STAGE RENAL DISEASE: Patient is scheduled for access in the left arm tomorrow.  She is due for dialysis today so it looks like her dialysis schedule not interfere with surgery.  Her IV has not yet been removed from the left arm and I will reorder this. ? ?RENAL: Please do not dialyze the patient in the morning as she is scheduled for surgery Thursday morning.  Thank you. ? ?I have written preop orders. ? ? ?SUBJECTIVE:  ? ?No complaints ? ?PHYSICAL EXAM:  ? ?Vitals:  ? 10/01/21 0447 10/01/21 1427 10/01/21 1931 10/02/21 0441  ?BP: 122/65 116/74 139/73 (!) 154/82  ?Pulse: 73 71 73 66  ?Resp: 17 16 18 17   ?Temp: 98.7 ?F (37.1 ?C) 98.3 ?F (36.8 ?C) 98.1 ?F (36.7 ?C) 97.6 ?F (36.4 ?C)  ?TempSrc: Oral Oral    ?SpO2: 96% 100% 98% 98%  ?Weight: 88.3 kg   94.1 kg  ?Height:      ? ?Palpable left radial pulse. ?She still has an IV in her left arm. ? ?LABS:  ? ?Lab Results  ?Component Value Date  ? WBC 8.4 09/30/2021  ? HGB 9.1 (L) 09/30/2021  ? HCT 28.7 (L) 09/30/2021  ? MCV 95.0 09/30/2021  ? PLT 192 09/30/2021  ? ?Lab Results  ?Component Value Date  ? CREATININE 4.66 (H) 10/02/2021  ? ?Lab Results  ?Component Value Date  ? INR 1.1 09/16/2021  ? ?CBG (last 3)  ?Recent Labs  ?  10/01/21 ?1621 10/01/21 ?2109 10/02/21 ?8144  ?GLUCAP 247* 206* 141*  ? ? ?PROBLEM LIST:   ? ?Principal Problem: ?  Ankle fracture, bimalleolar, closed, left, sequela ?Active Problems: ?  Pressure injury of skin ? ? ?CURRENT MEDS:  ? ? (feeding supplement) PROSource Plus  30 mL Oral BID WC  ? acetaminophen  500 mg Oral Q12H  ? atorvastatin  40 mg Oral Q supper  ? benztropine  1 mg Oral QPM  ? Chlorhexidine Gluconate Cloth  6 each Topical BID  ? Chlorhexidine Gluconate Cloth  6 each Topical Q0600  ? darbepoetin (ARANESP) injection - DIALYSIS  60 mcg Intravenous Q Mon-HD  ? docusate sodium  100 mg Oral Daily  ? enoxaparin (LOVENOX) injection  30 mg Subcutaneous Q24H  ? FLUoxetine  60 mg Oral q morning  ?  gabapentin  100 mg Oral Daily  ? insulin aspart  0-5 Units Subcutaneous QHS  ? insulin aspart  0-6 Units Subcutaneous TID WC  ? lurasidone  40 mg Oral QHS  ? metoprolol succinate  50 mg Oral Daily  ? multivitamin  1 tablet Oral QHS  ? protein supplement  1 Scoop Oral TID WC  ? [START ON 10/08/2021] Semaglutide (1 MG/DOSE)  1 mg Subcutaneous Weekly  ? traZODone  300 mg Oral QHS  ? ? ?[name] ?Office: [phone] ?10/02/2021 ? ?

## 2021-10-02 NOTE — Progress Notes (Signed)
Team Conference Report to Patient/Family ? ?Team Conference discussion was reviewed with the patient and caregiver, including goals, any changes in plan of care and target discharge date.  Patient and caregiver express understanding and are in agreement.  The patient has a target discharge date of 10/10/21. ? ?Sw spoke with patient SIL and provided team conference updates. Patient SIL will not be physically present for education due to him feeling prepared. Patient SIL reports that we can give him a call by phone to complete education. No additional questions or concerns. ? ?Dyanne Iha ?10/02/2021, 2:30 PM  ?

## 2021-10-03 ENCOUNTER — Inpatient Hospital Stay (HOSPITAL_COMMUNITY): Admission: RE | Admit: 2021-10-03 | Payer: Medicaid Other | Source: Ambulatory Visit | Admitting: Vascular Surgery

## 2021-10-03 ENCOUNTER — Encounter (HOSPITAL_COMMUNITY)
Admission: RE | Disposition: A | Discharge status: Home / Self Care | Payer: Self-pay | Source: Intra-hospital | Attending: Physical Medicine and Rehabilitation

## 2021-10-03 ENCOUNTER — Inpatient Hospital Stay (HOSPITAL_COMMUNITY): Payer: Medicaid Other

## 2021-10-03 ENCOUNTER — Inpatient Hospital Stay (HOSPITAL_COMMUNITY): Payer: Medicaid Other | Admitting: Certified Registered Nurse Anesthetist

## 2021-10-03 DIAGNOSIS — D631 Anemia in chronic kidney disease: Secondary | ICD-10-CM

## 2021-10-03 DIAGNOSIS — I120 Hypertensive chronic kidney disease with stage 5 chronic kidney disease or end stage renal disease: Secondary | ICD-10-CM

## 2021-10-03 DIAGNOSIS — N186 End stage renal disease: Secondary | ICD-10-CM

## 2021-10-03 DIAGNOSIS — E1122 Type 2 diabetes mellitus with diabetic chronic kidney disease: Secondary | ICD-10-CM

## 2021-10-03 DIAGNOSIS — Z992 Dependence on renal dialysis: Secondary | ICD-10-CM

## 2021-10-03 HISTORY — PX: AV FISTULA PLACEMENT: SHX1204

## 2021-10-03 LAB — GLUCOSE, CAPILLARY
Glucose-Capillary: 175 mg/dL — ABNORMAL HIGH (ref 70–99)
Glucose-Capillary: 230 mg/dL — ABNORMAL HIGH (ref 70–99)
Glucose-Capillary: 325 mg/dL — ABNORMAL HIGH (ref 70–99)
Glucose-Capillary: 343 mg/dL — ABNORMAL HIGH (ref 70–99)
Glucose-Capillary: 247 mg/dL — ABNORMAL HIGH (ref 70–99)

## 2021-10-03 SURGERY — ARTERIOVENOUS (AV) FISTULA CREATION
Anesthesia: Monitor Anesthesia Care | Site: Arm Upper | Laterality: Left

## 2021-10-03 SURGERY — ARTERIOVENOUS (AV) FISTULA CREATION
Anesthesia: Choice | Laterality: Left

## 2021-10-03 MED ORDER — FENTANYL CITRATE (PF) 250 MCG/5ML IJ SOLN
INTRAMUSCULAR | Status: AC
  Filled 2021-10-03: qty 5

## 2021-10-03 MED ORDER — DEXAMETHASONE SODIUM PHOSPHATE 10 MG/ML IJ SOLN
INTRAMUSCULAR | Status: DC | PRN
  Administered 2021-10-03: 4 mg via INTRAVENOUS

## 2021-10-03 MED ORDER — SUCCINYLCHOLINE CHLORIDE 200 MG/10ML IV SOSY
PREFILLED_SYRINGE | INTRAVENOUS | Status: DC | PRN
  Administered 2021-10-03: 140 mg via INTRAVENOUS

## 2021-10-03 MED ORDER — PROPOFOL 1000 MG/100ML IV EMUL
INTRAVENOUS | Status: AC
  Filled 2021-10-03: qty 100

## 2021-10-03 MED ORDER — PROPOFOL 10 MG/ML IV BOLUS
INTRAVENOUS | Status: DC | PRN
  Administered 2021-10-03: 100 ug/kg/min via INTRAVENOUS

## 2021-10-03 MED ORDER — ONDANSETRON HCL 4 MG/2ML IJ SOLN
INTRAMUSCULAR | Status: DC | PRN
Start: 2021-10-03 — End: 2021-10-03
  Administered 2021-10-03: 4 mg via INTRAVENOUS

## 2021-10-03 MED ORDER — STERILE WATER FOR IRRIGATION IR SOLN
Status: DC | PRN
  Administered 2021-10-03: 1000 mL

## 2021-10-03 MED ORDER — ONDANSETRON HCL 4 MG/2ML IJ SOLN
INTRAMUSCULAR | Status: AC
  Filled 2021-10-03: qty 2

## 2021-10-03 MED ORDER — HEPARIN 6000 UNIT IRRIGATION SOLUTION
Status: AC
  Filled 2021-10-03: qty 500

## 2021-10-03 MED ORDER — LIDOCAINE 2% (20 MG/ML) 5 ML SYRINGE
INTRAMUSCULAR | Status: AC
  Filled 2021-10-03: qty 5

## 2021-10-03 MED ORDER — PHENYLEPHRINE HCL-NACL 20-0.9 MG/250ML-% IV SOLN
INTRAVENOUS | Status: DC | PRN
Start: 2021-10-03 — End: 2021-10-03
  Administered 2021-10-03: 50 ug/min via INTRAVENOUS

## 2021-10-03 MED ORDER — SUGAMMADEX SODIUM 200 MG/2ML IV SOLN
INTRAVENOUS | Status: DC | PRN
  Administered 2021-10-03: 200 mg via INTRAVENOUS

## 2021-10-03 MED ORDER — ROCURONIUM BROMIDE 10 MG/ML (PF) SYRINGE
PREFILLED_SYRINGE | INTRAVENOUS | Status: DC | PRN
  Administered 2021-10-03: 30 mg via INTRAVENOUS

## 2021-10-03 MED ORDER — FENTANYL CITRATE (PF) 250 MCG/5ML IJ SOLN
INTRAMUSCULAR | Status: DC | PRN
  Administered 2021-10-03: 50 ug via INTRAVENOUS

## 2021-10-03 MED ORDER — ONDANSETRON HCL 4 MG/2ML IJ SOLN: 4.0000 mg | Freq: Once | INTRAMUSCULAR | Status: DC | PRN

## 2021-10-03 MED ORDER — MIDAZOLAM HCL 2 MG/2ML IJ SOLN
INTRAMUSCULAR | Status: AC
  Filled 2021-10-03: qty 2

## 2021-10-03 MED ORDER — CHLORHEXIDINE GLUCONATE CLOTH 2 % EX PADS
6.0000 | MEDICATED_PAD | Freq: Every day | CUTANEOUS | Status: DC
  Administered 2021-10-04 – 2021-10-06 (×2): 6 via TOPICAL

## 2021-10-03 MED ORDER — ROCURONIUM BROMIDE 10 MG/ML (PF) SYRINGE
PREFILLED_SYRINGE | INTRAVENOUS | Status: AC
  Filled 2021-10-03: qty 10

## 2021-10-03 MED ORDER — OXYCODONE HCL 5 MG PO TABS: 5.0000 mg | ORAL_TABLET | Freq: Once | ORAL | Status: DC | PRN

## 2021-10-03 MED ORDER — EPHEDRINE SULFATE-NACL 50-0.9 MG/10ML-% IV SOSY
PREFILLED_SYRINGE | INTRAVENOUS | Status: DC | PRN
  Administered 2021-10-03: 10 mg via INTRAVENOUS

## 2021-10-03 MED ORDER — ROPIVACAINE HCL 5 MG/ML IJ SOLN
INTRAMUSCULAR | Status: DC | PRN
  Administered 2021-10-03: 15 mL via PERINEURAL

## 2021-10-03 MED ORDER — HEPARIN 6000 UNIT IRRIGATION SOLUTION
Status: DC | PRN
Start: 2021-10-03 — End: 2021-10-03
  Administered 2021-10-03: 1

## 2021-10-03 MED ORDER — HEPARIN SODIUM (PORCINE) 1000 UNIT/ML IJ SOLN
1600.0000 [IU] | Freq: Once | INTRAMUSCULAR | Status: AC
  Administered 2021-10-03: 1600 [IU] via INTRAVENOUS

## 2021-10-03 MED ORDER — LIDOCAINE-EPINEPHRINE (PF) 1.5 %-1:200000 IJ SOLN
INTRAMUSCULAR | Status: DC | PRN
  Administered 2021-10-03: 15 mL via PERINEURAL

## 2021-10-03 MED ORDER — LIDOCAINE 2% (20 MG/ML) 5 ML SYRINGE
INTRAMUSCULAR | Status: DC | PRN
Start: 2021-10-03 — End: 2021-10-03
  Administered 2021-10-03: 60 mg via INTRAVENOUS

## 2021-10-03 MED ORDER — DEXAMETHASONE SODIUM PHOSPHATE 10 MG/ML IJ SOLN
INTRAMUSCULAR | Status: AC
  Filled 2021-10-03: qty 1

## 2021-10-03 MED ORDER — FENTANYL CITRATE (PF) 100 MCG/2ML IJ SOLN: 25.0000 ug | INTRAMUSCULAR | Status: DC | PRN

## 2021-10-03 MED ORDER — EPHEDRINE 5 MG/ML INJ
INTRAVENOUS | Status: AC
  Filled 2021-10-03: qty 5

## 2021-10-03 MED ORDER — SODIUM CHLORIDE 0.9 % IV SOLN: INTRAVENOUS | Status: DC | PRN

## 2021-10-03 MED ORDER — HEPARIN SODIUM (PORCINE) 1000 UNIT/ML IJ SOLN
INTRAMUSCULAR | Status: AC
  Filled 2021-10-03: qty 10

## 2021-10-03 MED ORDER — PHENYLEPHRINE 80 MCG/ML (10ML) SYRINGE FOR IV PUSH (FOR BLOOD PRESSURE SUPPORT)
PREFILLED_SYRINGE | INTRAVENOUS | Status: DC | PRN
  Administered 2021-10-03: 240 ug via INTRAVENOUS
  Administered 2021-10-03: 80 ug via INTRAVENOUS

## 2021-10-03 MED ORDER — ACETAMINOPHEN 500 MG PO TABS
500.0000 mg | ORAL_TABLET | Freq: Two times a day (BID) | ORAL | Status: DC | PRN
Start: 2021-10-03 — End: 2021-10-10
  Administered 2021-10-07: 500 mg via ORAL
  Filled 2021-10-03 (×2): qty 1

## 2021-10-03 MED ORDER — MIDAZOLAM HCL 2 MG/2ML IJ SOLN
INTRAMUSCULAR | Status: DC | PRN
  Administered 2021-10-03: 1 mg via INTRAVENOUS

## 2021-10-03 MED ORDER — 0.9 % SODIUM CHLORIDE (POUR BTL) OPTIME
TOPICAL | Status: DC | PRN
  Administered 2021-10-03: 1000 mL

## 2021-10-03 MED ORDER — OXYCODONE HCL 5 MG/5ML PO SOLN: 5.0000 mg | Freq: Once | ORAL | Status: DC | PRN

## 2021-10-03 SURGICAL SUPPLY — 36 items
ARMBAND PINK RESTRICT EXTREMIT (MISCELLANEOUS) ×2 IMPLANT
BAG COUNTER SPONGE SURGICOUNT (BAG) ×2 IMPLANT
CANISTER SUCT 3000ML PPV (MISCELLANEOUS) ×2 IMPLANT
CLIP LIGATING EXTRA MED SLVR (CLIP) ×2 IMPLANT
CLIP LIGATING EXTRA SM BLUE (MISCELLANEOUS) ×2 IMPLANT
COVER PROBE W GEL 5X96 (DRAPES) IMPLANT
COVER PROBE W/GEL STERILE (IV SETS) ×1 IMPLANT
DERMABOND ADVANCED (GAUZE/BANDAGES/DRESSINGS) ×1
DERMABOND ADVANCED .7 DNX12 (GAUZE/BANDAGES/DRESSINGS) ×1 IMPLANT
ELECT REM PT RETURN 9FT ADLT (ELECTROSURGICAL) ×2
ELECTRODE REM PT RTRN 9FT ADLT (ELECTROSURGICAL) ×1 IMPLANT
GAUZE SPONGE 4X4 12PLY STRL (GAUZE/BANDAGES/DRESSINGS) ×1 IMPLANT
GLOVE BIO SURGEON STRL SZ 6.5 (GLOVE) ×1 IMPLANT
GLOVE BIO SURGEON STRL SZ7.5 (GLOVE) ×2 IMPLANT
GLOVE BIOGEL PI IND STRL 7.0 (GLOVE) IMPLANT
GLOVE BIOGEL PI INDICATOR 7.0 (GLOVE) ×1
GOWN STRL REUS W/ TWL LRG LVL3 (GOWN DISPOSABLE) ×2 IMPLANT
GOWN STRL REUS W/ TWL XL LVL3 (GOWN DISPOSABLE) ×1 IMPLANT
GOWN STRL REUS W/TWL LRG LVL3 (GOWN DISPOSABLE) ×2
GOWN STRL REUS W/TWL XL LVL3 (GOWN DISPOSABLE) ×1
INSERT FOGARTY SM (MISCELLANEOUS) IMPLANT
KIT BASIN OR (CUSTOM PROCEDURE TRAY) ×2 IMPLANT
KIT TURNOVER KIT B (KITS) ×2 IMPLANT
NS IRRIG 1000ML POUR BTL (IV SOLUTION) ×2 IMPLANT
PACK CV ACCESS (CUSTOM PROCEDURE TRAY) ×2 IMPLANT
PAD ARMBOARD 7.5X6 YLW CONV (MISCELLANEOUS) ×4 IMPLANT
SLING ARM FOAM STRAP LRG (SOFTGOODS) IMPLANT
SLING ARM FOAM STRAP MED (SOFTGOODS) ×1 IMPLANT
SPONGE T-LAP 18X18 ~~LOC~~+RFID (SPONGE) ×1 IMPLANT
SUT MNCRL AB 4-0 PS2 18 (SUTURE) ×2 IMPLANT
SUT PROLENE 6 0 BV (SUTURE) ×2 IMPLANT
SUT VIC AB 3-0 SH 27 (SUTURE) ×1
SUT VIC AB 3-0 SH 27X BRD (SUTURE) ×1 IMPLANT
TOWEL GREEN STERILE (TOWEL DISPOSABLE) ×2 IMPLANT
UNDERPAD 30X36 HEAVY ABSORB (UNDERPADS AND DIAPERS) ×2 IMPLANT
WATER STERILE IRR 1000ML POUR (IV SOLUTION) ×2 IMPLANT

## 2021-10-03 NOTE — Anesthesia Procedure Notes (Signed)
Anesthesia Regional Block: Supraclavicular block  ? ?Pre-Anesthetic Checklist: , timeout performed,  Correct Patient, Correct Site, Correct Laterality,  Correct Procedure, Correct Position, site marked,  Risks and benefits discussed,  Surgical consent,  Pre-op evaluation,  At surgeon's request and post-op pain management ? ?Laterality: Left ? ?Prep: chloraprep     ?  ?Needles:  ?Injection technique: Single-shot ? ?Needle Type: Echogenic Needle   ? ? ?Needle Length: 9cm  ? ? ? ? ?Additional Needles: ? ? ?Procedures:,,,, ultrasound used (permanent image in chart),,    ?Narrative:  ?Start time: 10/03/2021 6:40 AM ?End time: 10/03/2021 6:50 AM ?Injection made incrementally with aspirations every 5 mL. ? ?Performed by: Personally  ?Anesthesiologist: Myrtie Soman, MD ? ?Additional Notes: ?Patient tolerated the procedure well without complications ? ? ? ?

## 2021-10-03 NOTE — Op Note (Signed)
? ? ?  Patient name: Jessica Mercado MRN: [id number] DOB: 1961/04/03 Sex: female ? ?10/03/2021 ?Pre-operative Diagnosis: End-stage renal disease ?Post-operative diagnosis:  Same ?Surgeon:  Eda Paschal. Donzetta Matters, MD ?Assistant: Paulo Fruit, PA ?Procedure Performed:  Left arm brachial artery to basilic vein AV fistula creation ? ?Indications: 61 year old female now with a functional tunneled dialysis catheter in need of permanent access.  She is right-hand dominant.  She appears to have suitable basilic vein on the right for fistula creation. ? ?Findings: Basilic vein above the antecubital measured over 4 mm.  There were multiple branches that were divided between ties.  At completion there was a very strong thrill confirmed with Doppler and a radial artery signal at the wrist there is no palpable radial artery pulse the wrist but the signal was very strong. ?  ?Procedure:  The patient was identified in the holding area and taken to the operating room where she is placed upon upper table and initially MAC anesthesia was induced as a preoperative block of been placed.  Unfortunately patient vomited she was converted to general anesthesia.  After this she was sterilely prepped and draped in the left upper extremity usual fashion, antibiotics were minister timeout was called.  Ultrasound was used to identify what appeared to be a very suitable basilic vein above the antecubitum only.  The brachial artery at this level also appeared suitable in size.  A transverse incision was made between the 2.  We dissected down to the vein it appeared suitable for access creation.  Multiple branches were divided between ties.  We dissected through the deep fascia the brachial artery placed Vesseloops around this.  The vein was then clamped distally transected after being marked for orientation and then flushed with heparinized saline and spatulated and clamped.  The artery was clamped distally proximally opened longitudinally flushed with  heparinized saline a distal direction.  The vein was then sewn end-to-side with 6-0 Prolene suture.  Prior completion without flushing all directions.  Upon completion there is a strong thrill in the fistula.  We freed up some of the soft tissue and there were multiple branches right above the anastomosis which we then divided between ties to a total of about 4 and this did channel of the blood flow into the basilic vein much stronger now had a much stronger signal.  There is also a strong signal at the radial artery at the wrist that actually did not augment with compression of the fistula.  The wound was irrigated hemostasis obtained we closed in layers of Vicryl and Monocryl.  Dermabond is placed at the skin level.  She was awakened from anesthesia having tolerated procedure without any complication.  All counts were correct at completion. ? ?Given the complexity of the case,  the assistant was necessary in order to expedient the procedure and safely perform the technical aspects of the operation.  The assistant provided traction and countertraction to assist with exposure of the artery and vein.  They also assisted with suture ligation of multiple venous branches.  They played a critical role in the anastomosis. These skills, especially following the Prolene suture for the anastomosis, could not have been adequately performed by a scrub tech assistant.  ? ? ?EBL: 20 cc ? ? ? ? ?Shavon Zenz C. Donzetta Matters, MD ?[facility] ?Office: [phone] ?Pager: [phone] ? ? ?

## 2021-10-03 NOTE — Progress Notes (Signed)
Occupational Therapy Session Note ? ?Patient Details  ?Name: Jessica Mercado ?MRN: [id number] ?Date of Birth: 09/16/1960 ? ?Today's Date: 10/03/2021 ?OT Individual Time: 8366-2947 ?OT Individual Time Calculation (min): 25 min  and Today's Date: 10/03/2021 ?OT Missed Time: 35 Minutes ?Missed Time Reason: Patient fatigue;Unavailable (comment) (just had fistula placed, with LUE numb and pt unable to participate in OOB therapy 2/2 medicine and surgical procedure effects) ? ? ?Short Term Goals: ?Week 1:  OT Short Term Goal 1 (Week 1): Patient will be able to transfer to [facility] with mod a and cues ?OT Short Term Goal 2 (Week 1): Patient will  demonstrate ability to stand long enough to pull up/down LB clothing with min assist ?OT Short Term Goal 3 (Week 1): Patient will maintain LLE NWB status with functional mobility tasks ?OT Short Term Goal 4 (Week 1): Patient will tolerate upper body strengthening exercises to allow increased use of UE  on walker for functioanl mobility ? ?Skilled Therapeutic Interventions/Progress Updates:  ?Skilled OT intervention completed with focus on fistula education, bed level exercises. Pt received upright in bed, with nursing disconnecting pt's HD port from IV, as pt had just had procedure for fistula placement. Pt asking "I don't see that thing in my arm." Therapist educating pt on fistula mechanism and visibly showing pt her incision where it was placed. Fatigue and grogginess effecting pt's ability to participate in OOB therapy at this time, with pt agreeable to complete bed level exercises to promote functional strength needed for transfers.  ? ?With use of RUE only 2/2 LUE being numb from procedure, pt completed the following: ?(With yellow theraband anchored on bed rail) 2x10 reps ?Bicep flexion ?Chest presses ?Shoulder external/internal rotation ? ?Nausea expressed by pt with staff bringing breakfast tray after NPO status cleared. Pt was unable to prepare her meal 2/2 numb LUE with therapist  setting up tray, cutting food/opening containers and repositioning pt to upright in bed for functional and safe meal consumption. Pt was left upright eating meal, with bed alarm on and all needs in reach at end of session. ? ? ?Therapy Documentation ?Precautions:  ?Precautions ?Precautions: Fall ?Precaution Comments: wound vac L LE ?Required Braces or Orthoses: Splint/Cast ?Splint/Cast - Date Prophylactic Dressing Applied (if applicable): 65/46/50 ?Restrictions ?Weight Bearing Restrictions: Yes ?RLE Weight Bearing: Weight bearing as tolerated ?LLE Weight Bearing: Non weight bearing ? ? ? ?Therapy/Group: Individual Therapy ? ?Pepper Pike ?10/03/2021, 10:15 AM ?

## 2021-10-03 NOTE — Transfer of Care (Signed)
Immediate Anesthesia Transfer of Care Note ? ?Patient: Jessica Mercado ? ?Procedure(s) Performed: BRACHIOCEPAHLIC ARTERIOVENOUS FISTULA CREATION (Left: Arm Upper) ? ?Patient Location: PACU ? ?Anesthesia Type:GA combined with regional for post-op pain ? ?Level of Consciousness: awake, drowsy and patient cooperative ? ?Airway & Oxygen Therapy: Patient connected to nasal cannula oxygen ? ?Post-op Assessment: Report given to RN and Post -op Vital signs reviewed and stable ? ?Post vital signs: Reviewed and stable ? ?Last Vitals:  ?Vitals Value Taken Time  ?BP 134/61 10/03/21 0836  ?Temp    ?Pulse 68 10/03/21 0836  ?Resp 13 10/03/21 0836  ?SpO2 95 % 10/03/21 0836  ?Vitals shown include unvalidated device data. ? ?Last Pain:  ?Vitals:  ? 10/03/21 0504  ?TempSrc: Oral  ?PainSc:   ?   ? ?  ? ?Complications: No notable events documented. ?

## 2021-10-03 NOTE — Progress Notes (Signed)
Noted pt's target d/c date of 10/10/21 per CSW note. Contacted [facility] to make clinic aware that pt will likely start on 5/19. Will assist as needed. ? ?Melven Sartorius ?Renal Navigator ?[phone] ?

## 2021-10-03 NOTE — Anesthesia Postprocedure Evaluation (Signed)
Anesthesia Post Note ? ?Patient: Jessica Mercado ? ?Procedure(s) Performed: BRACHIOCEPAHLIC ARTERIOVENOUS FISTULA CREATION (Left: Arm Upper) ? ?  ? ?Patient location during evaluation: PACU ?Anesthesia Type: General ?Level of consciousness: awake and alert ?Pain management: pain level controlled ?Vital Signs Assessment: post-procedure vital signs reviewed and stable ?Respiratory status: spontaneous breathing, nonlabored ventilation, respiratory function stable and patient connected to nasal cannula oxygen ?Cardiovascular status: blood pressure returned to baseline and stable ?Postop Assessment: no apparent nausea or vomiting ?Anesthetic complications: no ?Comments: Patient vomited soon after sedation had been started in the OR. Intubated, no vomitus near glottic opening ?CXR obtained, patient has no respiratory symptoms in the pACU ? ? ?No notable events documented. ? ?Last Vitals:  ?Vitals:  ? 10/03/21 0906 10/03/21 0926  ?BP: 135/64 (!) 131/59  ?Pulse: 68 70  ?Resp: 20 17  ?Temp: 36.4 ?C (!) 36.3 ?C  ?SpO2: 92% 91%  ?  ?Last Pain:  ?Vitals:  ? 10/03/21 0906  ?TempSrc:   ?PainSc: 0-No pain  ? ? ?  ?  ?  ?  ?  ?  ? ?Makael Stein S ? ? ? ? ?

## 2021-10-03 NOTE — Anesthesia Preprocedure Evaluation (Signed)
Anesthesia Evaluation  ?Patient identified by MRN, date of birth, ID band ?Patient awake ? ? ? ?Reviewed: ?Allergy & Precautions, NPO status , Patient's Chart, lab work & pertinent test results ? ?Airway ?Mallampati: II ? ?TM Distance: >3 FB ?Neck ROM: Full ? ? ? Dental ?no notable dental hx. ? ?  ?Pulmonary ?sleep apnea ,  ?  ?Pulmonary exam normal ?breath sounds clear to auscultation ? ? ? ? ? ? Cardiovascular ?hypertension, Pt. on medications ?Normal cardiovascular exam ?Rhythm:Regular Rate:Normal ? ? ?  ?Neuro/Psych ?negative neurological ROS ? negative psych ROS  ? GI/Hepatic ?negative GI ROS, Neg liver ROS,   ?Endo/Other  ?diabetes ? Renal/GU ?ESRFRenal disease  ?negative genitourinary ?  ?Musculoskeletal ?negative musculoskeletal ROS ?(+)  ? Abdominal ?  ?Peds ?negative pediatric ROS ?(+)  Hematology ? ?(+) Blood dyscrasia, anemia ,   ?Anesthesia Other Findings ? ? Reproductive/Obstetrics ?negative OB ROS ? ?  ? ? ? ? ? ? ? ? ? ? ? ? ? ?  ?  ? ? ? ? ? ? ? ? ?Anesthesia Physical ?Anesthesia Plan ? ?ASA: 4 ? ?Anesthesia Plan: MAC  ? ?Post-op Pain Management: Minimal or no pain anticipated and Regional block*  ? ?Induction: Intravenous ? ?PONV Risk Score and Plan: 2 and Propofol infusion and Treatment may vary due to age or medical condition ? ?Airway Management Planned: Simple Face Mask ? ?Additional Equipment:  ? ?Intra-op Plan:  ? ?Post-operative Plan:  ? ?Informed Consent: I have reviewed the patients History and Physical, chart, labs and discussed the procedure including the risks, benefits and alternatives for the proposed anesthesia with the patient or authorized representative who has indicated his/her understanding and acceptance.  ? ? ? ?Dental advisory given ? ?Plan Discussed with: CRNA and Surgeon ? ?Anesthesia Plan Comments:   ? ? ? ? ? ? ?Anesthesia Quick Evaluation ? ?

## 2021-10-03 NOTE — Progress Notes (Signed)
?                                                       PROGRESS NOTE ? ? ?Subjective/Complaints: ?Back from OR, diet restarted.  ?Denies pain ?Has left arm numbness ?No new concerns ? ?ROS: Patient denies fever, rash, sore throat, blurred vision, dizziness, nausea, vomiting, diarrhea, cough, shortness of breath or chest pain, joint or back/neck pain, headache, or mood change, pain. +constipation, +anuria, + left arm numbness ? ?Objective: ?  ?No results found. ? ?Recent Labs  ?  09/30/21 ?1231  ?WBC 8.4  ?HGB 9.1*  ?HCT 28.7*  ?PLT 192  ? ?Recent Labs  ?  09/30/21 ?1231 10/02/21 ?2951  ?NA 132* 131*  ?K 4.0 3.8  ?CL 98 100  ?CO2 24 26  ?GLUCOSE 268* 147*  ?BUN 43* 26*  ?CREATININE 6.40* 4.66*  ?CALCIUM 8.3* 8.1*  ? ? ?Intake/Output Summary (Last 24 hours) at 10/03/2021 1024 ?Last data filed at 10/03/2021 8841 ?Gross per 24 hour  ?Intake 680 ml  ?Output 10 ml  ?Net 670 ml  ?  ? ?Pressure Injury 09/25/21 Sacrum Medial;Upper Stage 2 -  Partial thickness loss of dermis presenting as a shallow open injury with a red, pink wound bed without slough. 1 mm x 58mm (Active)  ?09/25/21 2249  ?Location: Sacrum  ?Location Orientation: Medial;Upper  ?Staging: Stage 2 -  Partial thickness loss of dermis presenting as a shallow open injury with a red, pink wound bed without slough.  ?Wound Description (Comments): 1 mm x 39mm (Staged by DARA, RN)  ?Present on Admission: Yes  ? ? ?Physical Exam: ?Vital Signs ?Blood pressure (!) 131/59, pulse 70, temperature (!) 97.3 ?F (36.3 ?C), resp. rate 17, height 5\' 7"  (1.702 m), weight 89 kg, SpO2 91 %. ?Constitutional: No distress . Vital signs reviewed. BMI 32.49 ?HEENT: NCAT, EOMI, oral membranes moist ?Neck: supple ?Cardiovascular: RRR without murmur. No JVD    ?Respiratory/Chest: CTA Bilaterally without wheezes or rales. Normal effort    ?GI/Abdomen: BS +, non-tender, non-distended ?Ext: no clubbing, cyanosis, or edema ?Psych: flat but cooperative  ?Skin: right IJ TDC, left foot with vac,  sacral wound stage 2 ?Neuro/MSK: Alert. Lef foot in ace bandage with wound vac- strength not tested given nonweightbearing status. Impaired problem solving, delayed in general. Decreased sensation in left arm.  ? ?Assessment/Plan: ?1. Functional deficits which require 3+ hours per day of interdisciplinary therapy in a comprehensive inpatient rehab setting. ?Physiatrist is providing close team supervision and 24 hour management of active medical problems listed below. ?Physiatrist and rehab team continue to assess barriers to discharge/monitor patient progress toward functional and medical goals ? ?Care Tool: ? ?Bathing ?   ?Body parts bathed by patient: Right arm, Left arm, Chest, Abdomen, Right upper leg, Left upper leg, Right lower leg, Face  ? Body parts bathed by helper: Buttocks, Right lower leg ?Body parts n/a: Left lower leg (cast) ?  ?Bathing assist Assist Level: Supervision/Verbal cueing ?  ?  ?Upper Body Dressing/Undressing ?Upper body dressing   ?What is the patient wearing?: Pull over shirt ?   ?Upper body assist Assist Level: Supervision/Verbal cueing ?   ?Lower Body Dressing/Undressing ?Lower body dressing ? ? ? Lower body dressing activity did not occur: Safety/medical concerns (Wound vac temp disconnected by nursing staff) ?What is the  patient wearing?: Pants ? ?  ? ?Lower body assist Assist for lower body dressing: Moderate Assistance - Patient 50 - 74% ?   ? ?Toileting ?Toileting    ?Toileting assist Assist for toileting: Maximal Assistance - Patient 25 - 49% ?  ?  ?Transfers ?Chair/bed transfer ? ?Transfers assist ? Chair/bed transfer activity did not occur: Refused ? ?Chair/bed transfer assist level: Minimal Assistance - Patient > 75% (slideboard) ?  ?  ?Locomotion ?Ambulation ? ? ?Ambulation assist ? ? Ambulation activity did not occur: Refused ? ?  ?  ?   ? ?Walk 10 feet activity ? ? ?Assist ? Walk 10 feet activity did not occur: Refused ? ?  ?   ? ?Walk 50 feet activity ? ? ?Assist Walk 50  feet with 2 turns activity did not occur: Safety/medical concerns ? ?  ?   ? ? ?Walk 150 feet activity ? ? ?Assist Walk 150 feet activity did not occur: Safety/medical concerns ? ?  ?  ?  ? ?Walk 10 feet on uneven surface  ?activity ? ? ?Assist Walk 10 feet on uneven surfaces activity did not occur: Safety/medical concerns ? ? ?  ?   ? ?Wheelchair ? ? ? ? ?Assist Is the patient using a wheelchair?: Yes ?Type of Wheelchair: Manual ?Wheelchair activity did not occur: Refused ? ?Wheelchair assist level: Supervision/Verbal cueing ?Max wheelchair distance: 25ft  ? ? ?Wheelchair 50 feet with 2 turns activity ? ? ? ?Assist ? ?  ?Wheelchair 50 feet with 2 turns activity did not occur: Refused ? ? ?   ? ?Wheelchair 150 feet activity  ? ? ? ?Assist ? Wheelchair 150 feet activity did not occur: Refused ? ? ?   ? ?Blood pressure (!) 131/59, pulse 70, temperature (!) 97.3 ?F (36.3 ?C), resp. rate 17, height 5\' 7"  (1.702 m), weight 89 kg, SpO2 91 %. ? ? ? Medical Problem List and Plan: ?1. Functional deficits secondary to left trimalleolar ankle fracture ?            -patient may not shower until wound vac removal. ?            -ELOS/Goals: 14-16 days mod I ?           -Continue CIR therapies including PT, OT  ? -messaged SW to schedule caregiver training with son.  ?2.  Impaired mobility, nonambulatory:  continue Lovenox.  ?3. Postoperative pain: Change Tylenol BID to prn. Discontinue oxycodone. Discussed with patient. ?4. Bipolar disorder?: continue Latuda ?5. Neuropsych: This patient is capable of making decisions on her own behalf. ?6. Left lower extremity trimalleolar fracture: wound avc to be removed Wednesday 5/10 by ortho, discussed with nursing and patient ?7. Obesity: BMI 32.32 provide dietary education. Restart Semaglutide. 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 HTN: Monitor BP TID ?11. T2DM: Hgb A1C- 5.7 (down from 6.6  a couple of  months ago)--was on glucotrol and ozempic PTA ?             --CBG (last 3)  ?Recent Labs  ?  10/02/21 ?2102 10/03/21 ?0300 10/03/21 ?0836  ?GLUCAP 217* 175* 230*  ?   -  ssi ? 5/7-restart Semaglutide.--first dose 09/30/21 from home. Increase Semglutide to 1.25mg  ? -cover with ssi for now ? -change prosource to with meals.  ?12. Bipolar disorder: On Latuda, Trazodone and Prozac.  ?--Parkinsonism/EPS  managed with benztropine.  ?-might benefit from neuro-psych eval ?13. Anemia of chronic disease: continue to monitor  Hgb with HD labs.  ?14. Screening for vitamin D deficiency: Vitamin D, 25 hydroxyl-: Level high normal @ 93.97. Does not need supplementation ?15. Hyponatremia: normalized, continue to monitor Na with HD labs ?16. Anuric: discussed relation to uremia, hemodialysis fluid restriction ? -no pvr's, making intermittent urine only ?17. Poor po intake ? 5/7 -reached out nephro. [place] for reg CM diet ?18. Constipation: continue colace daily.  ?19. Obesity BMI 32.42: change prosource to with meals.  ?20. Insomnia: continue trazodone ?21. Left arm numbness: discussed that nerve block should wear off today ? ?LOS: ?8 days ?A FACE TO FACE EVALUATION WAS PERFORMED ? ?Jessica Mercado ?10/03/2021, 10:24 AM  ? ?  ?

## 2021-10-03 NOTE — Progress Notes (Signed)
Patient arrived back from surgery, vital signs obtained and patient reporting 0 out of 10 pain. Reporting L arm numbness, PACU nurse stated nerve block was given for L arm.  ?

## 2021-10-03 NOTE — Progress Notes (Signed)
?  Progress Note ? ? ? ?10/03/2021 ?7:13 AM ?Day of Surgery ? ?Subjective:  no current issues ? ?Vitals:  ? 10/02/21 1955 10/03/21 0504  ?BP: 113/79 123/72  ?Pulse: 76 64  ?Resp: 18 18  ?Temp: 98.2 ?F (36.8 ?C) 97.7 ?F (36.5 ?C)  ?SpO2: 98% 98%  ? ? ?Physical Exam: ?Aaox3 ?Palpable left radial pulse ?No iv's in left arm ? ?CBC ?   ?Component Value Date/Time  ? WBC 8.4 09/30/2021 1231  ? RBC 3.02 (L) 09/30/2021 1231  ? HGB 9.1 (L) 09/30/2021 1231  ? HCT 28.7 (L) 09/30/2021 1231  ? PLT 192 09/30/2021 1231  ? MCV 95.0 09/30/2021 1231  ? MCH 30.1 09/30/2021 1231  ? MCHC 31.7 09/30/2021 1231  ? RDW 15.6 (H) 09/30/2021 1231  ? LYMPHSABS 1.6 09/16/2021 0928  ? MONOABS 0.6 09/16/2021 0928  ? EOSABS 0.1 09/16/2021 0928  ? BASOSABS 0.1 09/16/2021 0928  ? ? ?BMET ?   ?Component Value Date/Time  ? NA 131 (L) 10/02/2021 0523  ? K 3.8 10/02/2021 0523  ? CL 100 10/02/2021 0523  ? CO2 26 10/02/2021 0523  ? GLUCOSE 147 (H) 10/02/2021 0523  ? BUN 26 (H) 10/02/2021 0523  ? CREATININE 4.66 (H) 10/02/2021 0523  ? CALCIUM 8.1 (L) 10/02/2021 0523  ? GFRNONAA 10 (L) 10/02/2021 0523  ? GFRAA 47 (L) 01/07/2018 1551  ? ? ?INR ?   ?Component Value Date/Time  ? INR 1.1 09/16/2021 1306  ? ? ? ?Intake/Output Summary (Last 24 hours) at 10/03/2021 0713 ?Last data filed at 10/02/2021 2217 ?Gross per 24 hour  ?Intake 600 ml  ?Output --  ?Net 600 ml  ? ? ? ?Assessment:  61 y.o. female is s/p TDC ? ? ?Plan: ?Left arm avf vs avg today in OR ? ? ?Kamyrah Feeser C. Donzetta Matters, MD ?[facility] ?Office: [phone] ?Pager: [phone] ? ?10/03/2021 ?7:13 AM ? ?

## 2021-10-03 NOTE — Anesthesia Procedure Notes (Signed)
Procedure Name: Intubation ?Date/Time: 10/03/2021 7:35 AM ?Performed by: Lavell Luster, CRNA ?Pre-anesthesia Checklist: Patient identified, Emergency Drugs available, Patient being monitored, Suction available and Timeout performed ?Patient Re-evaluated:Patient Re-evaluated prior to induction ?Oxygen Delivery Method: Circle system utilized ?Preoxygenation: Pre-oxygenation with 100% oxygen ?Induction Type: IV induction and Rapid sequence ?Laryngoscope Size: Mac and 3 ?Grade View: Grade I ?Tube type: Oral ?Tube size: 7.5 mm ?Number of attempts: 1 ?Airway Equipment and Method: Stylet ?Placement Confirmation: ETT inserted through vocal cords under direct vision, positive ETCO2 and breath sounds checked- equal and bilateral ?Secured at: 21 cm ?Tube secured with: Tape ?Dental Injury: Teeth and Oropharynx as per pre-operative assessment  ? ? ? ? ?

## 2021-10-03 NOTE — Anesthesia Procedure Notes (Signed)
Anesthesia Procedure Image    

## 2021-10-03 NOTE — Progress Notes (Signed)
Physical Therapy Session Note ? ?Patient Details  ?Name: Jessica Mercado ?MRN: [id number] ?Date of Birth: Nov 30, 1960 ? ?Today's Date: 10/03/2021 ?PT Individual Time: 5732-2025 ?PT Individual Time Calculation: 56 minutes ?PT Missed Time: 37 Minutes ?Missed Time Reason: Other (Comment) (off unit for procedure) ? ?Short Term Goals: ?Week 1:  PT Short Term Goal 1 (Week 1): Patient will adhere to weight bearing precautions with all mobility at least 50% of the time ?PT Short Term Goal 2 (Week 1): Patient will transfer bed <> wc with LRAD and ModA ?PT Short Term Goal 3 (Week 1): Patient will complete sit <> stand with LRAD and CGA while maintaining wbng precautions ?PT Short Term Goal 4 (Week 1): Patient will initiate wc mob ? ?Skilled Therapeutic Interventions/Progress Updates:  ? Treatment Session 1 ?Per RN, pt off unit for procedure to get fistula placed and will return sometime this morning. Will attempt to make up time as schedule allows. 75 minutes missed of skilled physical therapy due to being off unit for procedure.  ? ?Treatment Session 2 ?Received pt semi-reclined in bed reporting "not doing good" stating "I thought you all would leave me alone since I had my procedure today". Pt reported pain and numbness rated 9/10 in L arm from fistula placement - RN notified and present to administer pain medication. Pt initially refusing therapy requesting to "lay here" but with encouragement agreed to work on exercises. Pt transferred semi-reclined<>sitting EOB with HOB elevated and use of bedrails with min A (due to increased pain and decreased ability to use LUE). Pt performed the following exercises sitting EOB: ?-LAQ 2x10 bilaterally but complaining stating "I cant do no more" ?-hip flexion x8 on RLE and x1 on LLE - moaning and stating "no, I can't do it" ?Pt requested to lie back down and transferred sit<>supine with min A for LLE management and scooted to [facility] with mod A and use of Trendelenburg bed position - cues to  avoid putting weight through LLE. Pt continued with the following exercises with emphasis on LE strength/ROM: ?-hip abduction 2x10 bilaterally AAROM on LLE ?-SLR x8 bilaterally AAROM on LLE ?-hip adduction pillow squeezes 2x15 ?Attempted to call pt's son, Jessica Mercado, to schedule family education, however area code would not go through hospital line. Located pt's cell phone and set up to charge. Pt agreeable to therapist providing pt with HEP as she reports she and her son will be moving to [place] and a printout of exercises would be beneficial for him to be able to go through with her. Educated pt on frequency/duration/technique for the following exercises: ?- Small Range Straight Leg Raise  - 1 x daily - 7 x weekly - 2 sets - 10 reps ?- Supine Straight Leg Hip Adduction and Quad Set with Ball  - 1 x daily - 7 x weekly - 3 sets - 10 reps ?- Supine Hip Abduction  - 1 x daily - 7 x weekly - 2 sets - 10 reps ?- Seated Long Arc Quad  - 1 x daily - 7 x weekly - 3 sets - 10 reps ?- Seated March  - 1 x daily - 7 x weekly - 3 sets - 10 reps ?- Supine Ankle Circles  - 1 x daily - 7 x weekly - 3 sets - 10 reps ?Concluded session with pt semi-reclined in bed, needs within reach, and bed alarm on. NT at bedside checking vitals.  ? ?Therapy Documentation ?Precautions:  ?Precautions ?Precautions: Fall ?Precaution Comments: wound vac L LE ?  Required Braces or Orthoses: Splint/Cast ?Splint/Cast - Date Prophylactic Dressing Applied (if applicable): 24/46/95 ?Restrictions ?Weight Bearing Restrictions: Yes ?RLE Weight Bearing: Weight bearing as tolerated ?LLE Weight Bearing: Non weight bearing ? ?Therapy/Group: Individual Therapy ?Blenda Nicely ?Becky Sax PT, DPT  ?10/03/2021, 9:36 AM  ?

## 2021-10-03 NOTE — Progress Notes (Signed)
Pt pre op check list, CHG, and consent form completed. Pt denies questions or concerns regarding procedure scheduled for 5/11. Pt resting in bed at this time. Call light in reach.  ?

## 2021-10-03 NOTE — Progress Notes (Signed)
Pt leaving unit with OR tech.  ?

## 2021-10-03 NOTE — Progress Notes (Signed)
Patient ID: Jessica Mercado, female   DOB: 08/18/1960, 61 y.o.   MRN: [id number] ? ?[facility] ?Progress Note  ? ?Assessment/ Plan:   ?1. Falls/syncope/left trimalleolar ankle fracture: The fall and syncope was likely related to worsening uremia and she is undergoing evaluation by orthopedic surgery for management of her trimalleolar ankle fracture.  Admitted to inpatient rehabilitation for continued strengthening to facilitate eventual discharge home. ? ?2. 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  She is s/p left arm brachial artery to basilic vein AVF on 2/33 with Dr. Donzetta Matters ?- HD per MWF schedule  ?- Note she will have a MWF schedule upon discharge at [facility] ?- renal panel in am  ?- s/p vein mapping ? ?3. 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  Increased aranesp to 60 mcg weekly on mondays.  Status post intravenous iron. ? 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 PTH is pending.  ? ?5. Hypertension: added back prazosin  ? ?Dispo - spoke with HD SW - she will let us know if there are any updates.   ? ? ?Subjective:   ?She underwent left arm brachial artery to basilic vein AVF creation this am with Dr. Donzetta Matters.  Last HD on 5/10 without any charting as to her final UF.   We discussed plans for HD via tunn catheter until AVF heals. ? ?Review of systems:   ?Denies n/v ?Denies chest pain or shortness of breath  ? ?Objective:   ?BP (!) 148/71 (BP Location: Right Arm)   Pulse 83   Temp 99.2 ?F (37.3 ?C) (Oral)   Resp 17   Ht 5\' 7"  (1.702 m)   Wt 89 kg   LMP  (LMP Unknown)   SpO2 98%   BMI 30.73 kg/m?  ? ?Physical Exam:    ?General adult female in bed in no acute distress ?HEENT normocephalic atraumatic extraocular movements intact sclera anicteric ?Neck supple trachea midline ?Lungs clear to auscultation  bilaterally normal work of breathing at rest  ?Heart S1S2 no rub ?Abdomen soft nontender nondistended ?Extremities trace edema; left foot in boot ?Psych normal mood and affect ?Access tunneled HD catheter in place; LUE AVF with bruit no thrill but swollen  ? ?Labs: ?BMET ?Recent Labs  ?Lab 09/27/21 ?1621 09/30/21 ?1231 10/02/21 ?0076  ?NA 136 132* 131*  ?K 3.7 4.0 3.8  ?CL 98 98 100  ?CO2 28 24 26   ?GLUCOSE 155* 268* 147*  ?BUN 10 43* 26*  ?CREATININE 2.26* 6.40* 4.66*  ?CALCIUM 8.1* 8.3* 8.1*  ?PHOS 1.5* 5.7* 4.2  ? ?CBC ?Recent Labs  ?Lab 09/27/21 ?1621 09/30/21 ?1231  ?WBC 13.4* 8.4  ?HGB 8.9* 9.1*  ?HCT 27.6* 28.7*  ?MCV 92.6 95.0  ?PLT 195 192  ? ? ?  ?Medications:   ? ? (feeding supplement) PROSource Plus  30 mL Oral BID WC  ? atorvastatin  40 mg Oral Q supper  ? benztropine  1 mg Oral QPM  ? Chlorhexidine Gluconate Cloth  6 each Topical BID  ? Chlorhexidine Gluconate Cloth  6 each Topical Q0600  ? darbepoetin (ARANESP) injection - DIALYSIS  60 mcg Intravenous Q Mon-HD  ? docusate sodium  100 mg Oral Daily  ? enoxaparin (LOVENOX) injection  30 mg Subcutaneous Q24H  ? FLUoxetine  60 mg Oral q morning  ? gabapentin  100 mg Oral Daily  ?  insulin aspart  0-5 Units Subcutaneous QHS  ? insulin aspart  0-6 Units Subcutaneous TID WC  ? lurasidone  40 mg Oral QHS  ? metoprolol succinate  50 mg Oral Daily  ? multivitamin  1 tablet Oral QHS  ? prazosin  1 mg Oral QHS  ? protein supplement  1 Scoop Oral TID WC  ? [START ON 10/08/2021] Semaglutide (1 MG/DOSE)  1 mg Subcutaneous Weekly  ? traZODone  300 mg Oral QHS  ? ?Claudia Desanctis, MD ?10/03/2021, 4:24 PM ? ? ? ? ? ?

## 2021-10-04 ENCOUNTER — Encounter (HOSPITAL_COMMUNITY): Payer: Self-pay | Admitting: Vascular Surgery

## 2021-10-04 ENCOUNTER — Inpatient Hospital Stay (HOSPITAL_COMMUNITY): Payer: Medicaid Other

## 2021-10-04 LAB — GLUCOSE, CAPILLARY
Glucose-Capillary: 200 mg/dL — ABNORMAL HIGH (ref 70–99)
Glucose-Capillary: 237 mg/dL — ABNORMAL HIGH (ref 70–99)
Glucose-Capillary: 168 mg/dL — ABNORMAL HIGH (ref 70–99)
Glucose-Capillary: 179 mg/dL — ABNORMAL HIGH (ref 70–99)
Glucose-Capillary: 223 mg/dL — ABNORMAL HIGH (ref 70–99)

## 2021-10-04 LAB — RENAL FUNCTION PANEL
Albumin: 2.8 g/dL — ABNORMAL LOW (ref 3.5–5.0)
Anion gap: 10 (ref 5–15)
BUN: 24 mg/dL — ABNORMAL HIGH (ref 6–20)
CO2: 27 mmol/L (ref 22–32)
Calcium: 8.6 mg/dL — ABNORMAL LOW (ref 8.9–10.3)
Chloride: 92 mmol/L — ABNORMAL LOW (ref 98–111)
Creatinine, Ser: 4.87 mg/dL — ABNORMAL HIGH (ref 0.44–1.00)
GFR, Estimated: 10 mL/min — ABNORMAL LOW (ref >60)
Glucose, Bld: 191 mg/dL — ABNORMAL HIGH (ref 70–99)
Phosphorus: 4.2 mg/dL (ref 2.5–4.6)
Potassium: 4 mmol/L (ref 3.5–5.1)
Sodium: 129 mmol/L — ABNORMAL LOW (ref 135–145)

## 2021-10-04 LAB — PARATHYROID HORMONE, INTACT (NO CA): PTH: 74 pg/mL — ABNORMAL HIGH (ref 15–65)

## 2021-10-04 MED ORDER — SEMAGLUTIDE (1 MG/DOSE) 4 MG/3ML ~~LOC~~ SOPN
2.0000 mg | PEN_INJECTOR | SUBCUTANEOUS | Status: DC
  Administered 2021-10-08: 2 mg via SUBCUTANEOUS
  Filled 2021-10-04: qty 1.5

## 2021-10-04 MED ORDER — HEPARIN SODIUM (PORCINE) 1000 UNIT/ML IJ SOLN
INTRAMUSCULAR | Status: AC
  Filled 2021-10-04: qty 4

## 2021-10-04 MED ORDER — DARBEPOETIN ALFA 100 MCG/0.5ML IJ SOSY
100.0000 ug | PREFILLED_SYRINGE | INTRAMUSCULAR | Status: DC
  Administered 2021-10-07: 100 ug via INTRAVENOUS
  Filled 2021-10-04: qty 0.5

## 2021-10-04 NOTE — Progress Notes (Signed)
Orthopedic Tech Progress Note ?Patient Details:  ?Jessica Mercado ?02/28/1961 ?[id number] ? ?Casting ?Type of Cast: Short leg cast ?Cast Location: left ?Cast Intervention: Application ? ?Post Interventions ?Patient Tolerated: Well ?Instructions Provided: Care of device ? ? ?[name] ?10/04/2021, 11:30 AM ? ?

## 2021-10-04 NOTE — Progress Notes (Signed)
Patient complains of vomiting all day and has not been able to eat dinner tray.  Compazine given by dayshift nurse.  States she has had some relief, but afraid to eat anything.  FSBS taken and was 223.   ?

## 2021-10-04 NOTE — Progress Notes (Signed)
Occupational Therapy Weekly Progress Note ? ?Patient Details  ?Name: Jessica Mercado ?MRN: [id number] ?Date of Birth: 1960/09/26 ? ?Beginning of progress report period: Sep 26, 2021 ?End of progress report period: Oct 04, 2021 ? ? ?Patient has met 2 of 4 short term goals.  Pt is making slow progress towards LTGs. Pt has progressed functional transfers from Max A stand pivot to slideboard transfers with min A, is able to bathe at an overall min A level, dress at an overall mod A level and requires max assist for toileting tasks. Pt continues to demonstrate poor NWB adherence in LLE, therefore downgraded/discontinued functional transfers/self-care goals to w/c level focus only for safety reasons. Even with slideboard transfers, pt still requires physical assist or foot block under RLE to prevent WB on LLE, with cues demonstrating to be minimally effective. Would benefit from continued practice with seated level self-care tasks to maximize pt's safety, independence and decrease caregiver burden. Have advised that pt's son-in-law attend a series of family education sessions prior to d/c, as pt will require physical hands on assist and 24 hour supervision, however have had difficulty with compliance of pt's caregiver after several attempts from CSW to schedule family education.  ? ?Patient continues to demonstrate the following deficits: muscle weakness, decreased cardiorespiratoy endurance, decreased coordination and decreased motor planning, decreased attention, decreased awareness, decreased problem solving, and decreased safety awareness, and difficulty maintaining precautions and therefore will continue to benefit from skilled OT intervention to enhance overall performance with BADL and Reduce care partner burden. ? ?Patient progressing toward long term goals..  Continue plan of care. ? ?OT Short Term Goals ?Week 1:  OT Short Term Goal 1 (Week 1): Patient will be able to transfer to [facility] with mod a and cues ?OT Short  Term Goal 1 - Progress (Week 1): Met ?OT Short Term Goal 2 (Week 1): Patient will  demonstrate ability to stand long enough to pull up/down LB clothing with min assist ?OT Short Term Goal 2 - Progress (Week 1): Discontinued (comment) (d/c standing goals 2/2 poor NWB adherence during standing transfers) ?OT Short Term Goal 3 (Week 1): Patient will maintain LLE NWB status with functional mobility tasks ?OT Short Term Goal 3 - Progress (Week 1): Progressing toward goal ?OT Short Term Goal 4 (Week 1): Patient will tolerate upper body strengthening exercises to allow increased use of UE  on walker for functioanl mobility ?OT Short Term Goal 4 - Progress (Week 1): Met ?Week 2:  OT Short Term Goal 1 (Week 2): STG = LTG 2/2 ELOS ? ? ? ?[place] ?10/04/2021, 7:48 AM  ?

## 2021-10-04 NOTE — Progress Notes (Signed)
Patient ID: Jessica Mercado, female   DOB: 1961-01-26, 61 y.o.   MRN: [id number] ? ?[facility] ?Progress Note  ? ?Assessment/ Plan:   ?1. Falls/syncope/left trimalleolar ankle fracture: The fall and syncope was likely related to worsening uremia and she is undergoing evaluation by orthopedic surgery for management of her trimalleolar ankle fracture.  Admitted to inpatient rehabilitation for continued strengthening to facilitate eventual discharge home. ? ?2. 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  She is s/p left arm brachial artery to basilic vein AVF on 4/66 with Dr. Donzetta Matters ?- HD per MWF schedule  ?- Note she will have a MWF schedule upon discharge at [place] ? ?3. 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  Increased aranesp to 100 mcg weekly on mondays.  Status post intravenous iron. ? 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 PTH low at 74.   ? ?5. Hypertension: added back prazosin  ? ?Dispo - spoke with HD SW - she is potentially for d/c on 5/18.  HD SW has alerted clinic to a potential start date of 5/19  ? ? ? ?Subjective:   ?Last HD on 5/10 without any charting as to her final UF.  Seen and examined on dialysis.  Blood pressure 121/76 and HR 70.  Tolerating goal.   RIJ tunn catheter.  She has had a little less swelling.  ? ?Review of systems:    ?Denies n/v ?Denies chest pain or shortness of breath  ? ?Objective:   ?BP (!) 129/91   Pulse 73   Temp 98.6 ?F (37 ?C) (Oral)   Resp 18   Ht 5\' 7"  (1.702 m)   Wt 86.6 kg   LMP  (LMP Unknown)   SpO2 90% Comment: giving 2L via nasal cannula  BMI 29.90 kg/m?  ? ?Physical Exam:    ?General adult female in bed in no acute distress ?HEENT normocephalic atraumatic extraocular movements intact sclera anicteric ?Neck supple trachea midline ?Lungs clear to auscultation  bilaterally normal work of breathing at rest  ?Heart S1S2 no rub ?Abdomen soft nontender nondistended ?Extremities no edema; left foot in cast ?Psych normal mood and affect ?Access tunneled HD catheter in place; LUE AVF with bruit weak thrill - new and swelling improved ? ?Labs: ?BMET ?Recent Labs  ?Lab 09/27/21 ?1621 09/30/21 ?1231 10/02/21 ?5993 10/04/21 ?5701  ?NA 136 132* 131* 129*  ?K 3.7 4.0 3.8 4.0  ?CL 98 98 100 92*  ?CO2 28 24 26 27   ?GLUCOSE 155* 268* 147* 191*  ?BUN 10 43* 26* 24*  ?CREATININE 2.26* 6.40* 4.66* 4.87*  ?CALCIUM 8.1* 8.3* 8.1* 8.6*  ?PHOS 1.5* 5.7* 4.2 4.2  ? ?CBC ?Recent Labs  ?Lab 09/27/21 ?1621 09/30/21 ?1231  ?WBC 13.4* 8.4  ?HGB 8.9* 9.1*  ?HCT 27.6* 28.7*  ?MCV 92.6 95.0  ?PLT 195 192  ? ? ?  ?Medications:   ? ? (feeding supplement) PROSource Plus  30 mL Oral BID WC  ? atorvastatin  40 mg Oral Q supper  ? benztropine  1 mg Oral QPM  ? Chlorhexidine Gluconate Cloth  6 each Topical BID  ? Chlorhexidine Gluconate Cloth  6 each Topical Q0600  ? darbepoetin (ARANESP) injection - DIALYSIS  60 mcg Intravenous Q Mon-HD  ? docusate sodium  100 mg Oral Daily  ? enoxaparin (LOVENOX) injection  30 mg Subcutaneous Q24H  ?  FLUoxetine  60 mg Oral q morning  ? gabapentin  100 mg Oral Daily  ? insulin aspart  0-5 Units Subcutaneous QHS  ? insulin aspart  0-6 Units Subcutaneous TID WC  ? lurasidone  40 mg Oral QHS  ? metoprolol succinate  50 mg Oral Daily  ? multivitamin  1 tablet Oral QHS  ? prazosin  1 mg Oral QHS  ? protein supplement  1 Scoop Oral TID WC  ? [START ON 10/08/2021] Semaglutide (1 MG/DOSE)  2 mg Subcutaneous Weekly  ? traZODone  300 mg Oral QHS  ? ?Claudia Desanctis, MD ?10/04/2021, 4:31 PM ? ? ? ? ? ?

## 2021-10-04 NOTE — Progress Notes (Signed)
Occupational Therapy Session Note ? ?Patient Details  ?Name: Jessica Mercado ?MRN: [id number] ?Date of Birth: 1960-09-19 ? ?Today's Date: 10/04/2021 ?OT Individual Time: 5465-6812 ?OT Individual Time Calculation (min): 58 min  ? ? ?Short Term Goals: ?Week 1:  OT Short Term Goal 1 (Week 1): Patient will be able to transfer to [facility] with mod a and cues ?OT Short Term Goal 2 (Week 1): Patient will  demonstrate ability to stand long enough to pull up/down LB clothing with min assist ?OT Short Term Goal 3 (Week 1): Patient will maintain LLE NWB status with functional mobility tasks ?OT Short Term Goal 4 (Week 1): Patient will tolerate upper body strengthening exercises to allow increased use of UE  on walker for functioanl mobility ? ?Skilled Therapeutic Interventions/Progress Updates:  ?Skilled OT intervention completed with focus on family education via phone call, toileting. Pt received upright in bed, no c/o pain, however expressed that she had been vomiting earlier but reported the nausea had subsided. Pt agreeable to contact pt's son-in-law during session to provide family education 2/2 transportation and physical limitations with caregiver being unable to come in for training at this time. ? ?Discussed in length with son-in-law "Bill", pt's requirement of at least min A for slide board transfers at this time, and up to max A for self-care tasks. Education provided about slideboard method, purpose, NWB precautions and pt's non-compliance. Bill reported having limitations himself, but feels prepared to assist her, with plan for him to attempt to have roommate assist him to hospital for hands on training. Reported that hospital bed, BSC and w/c are all ready at home. Attempted to re-enforce that pt will need physical assist at d/c and it is vital for hands on training vs video chat however [name] not very receptive. ? ?Pt completed bed mobility with min A for LLE management, with increased time. Attempted to total A place  board, then slide however pt reporting her brief felt snagged and upon checking was found to be completely saturated of urine only. Pt expressing "I'm not wet" when asked. Advised pt to return to bed for toileting. Completed bed mobility with mod A back to bed, repositioned in bed with max A with bed in trendelenburg position. Rolled R<>L with min A and Max A doffing/donning of brief. MD in room for rounds, with therapist updating on current status with Bill, and therapist strongly advising that pt keep current d/c date to progress and limit physical assist 2/2 caregiver limitations as well as to allow for hands on training  to maximize pt safety at home. Pt was left upright in bed, with bed alarm on and all needs in reach at end of session. ? ? ?Therapy Documentation ?Precautions:  ?Precautions ?Precautions: Fall ?Precaution Comments: wound vac L LE ?Required Braces or Orthoses: Splint/Cast ?Splint/Cast - Date Prophylactic Dressing Applied (if applicable): 75/17/00 ?Restrictions ?Weight Bearing Restrictions: Yes ?RLE Weight Bearing: Weight bearing as tolerated ?LLE Weight Bearing: Non weight bearing ? ? ? ? ?Therapy/Group: Individual Therapy ? ?[place] ?10/04/2021, 7:37 AM ?

## 2021-10-04 NOTE — Progress Notes (Signed)
?                                                       PROGRESS NOTE ? ? ?Subjective/Complaints: ?Wound vac to be removed today and short leg cast applied, discussed with patient ?Incontinent of urine ?Discussed hope for son-in-law to come in with roommate for caregiver training ? ?ROS: Patient denies fever, rash, sore throat, blurred vision, dizziness, nausea, vomiting, diarrhea, cough, shortness of breath or chest pain, joint or back/neck pain, headache, or mood change, pain. +constipation, +anuria, + left arm numbness ? ?Objective: ?  ?DG Ankle Complete Left ? ?Result Date: 10/04/2021 ?CLINICAL DATA:  Postoperative ankle ORIF EXAM: LEFT ANKLE COMPLETE - 3+ VIEW COMPARISON:  Left ankle radiographs 09/17/2021 FINDINGS: Redemonstration of overlying cast material which limits evaluation fine bony detail. Redemonstration of single long screw fixation of comminuted medial malleolar fracture. Unchanged mild diastasis of the fracture components. Redemonstration of distal fibular plate and screw fixation. Redemonstration of 2 pins from plantar approach traversing the posterior and mid subtalar joints and tibiotalar joint. Unchanged mildly displaced posterior malleolar fracture. Small plantar calcaneal heel spur. Mild spurring at the Achilles insertion on the calcaneus. IMPRESSION: 1. No significant change in alignment of trimalleolar fractures. 2. Status post medial and lateral malleolar ORIF and tibiotalar and subtalar pin fixation. Electronically Signed   By: Yvonne Kendall M.D.   On: 10/04/2021 10:18  ? ?DG CHEST PORT 1 VIEW ? ?Result Date: 10/03/2021 ?CLINICAL DATA:  Status post arteriovenous fistula creation. EXAM: PORTABLE CHEST 1 VIEW COMPARISON:  Sep 24, 2021. FINDINGS: Stable cardiomegaly. Right internal jugular dialysis catheter is unchanged in position. Stable elevated left hemidiaphragm is noted. Lungs are clear. Bony thorax is unremarkable. IMPRESSION: No active disease. Electronically Signed   By: Marijo Conception M.D.   On: 10/03/2021 10:46   ? ?No results for input(s): WBC, HGB, HCT, PLT in the last 72 hours. ? ?Recent Labs  ?  10/02/21 ?6568 10/04/21 ?1275  ?NA 131* 129*  ?K 3.8 4.0  ?CL 100 92*  ?CO2 26 27  ?GLUCOSE 147* 191*  ?BUN 26* 24*  ?CREATININE 4.66* 4.87*  ?CALCIUM 8.1* 8.6*  ? ? ?Intake/Output Summary (Last 24 hours) at 10/04/2021 1132 ?Last data filed at 10/03/2021 1900 ?Gross per 24 hour  ?Intake 80 ml  ?Output --  ?Net 80 ml  ?  ? ?Pressure Injury 09/25/21 Sacrum Medial;Upper Stage 2 -  Partial thickness loss of dermis presenting as a shallow open injury with a red, pink wound bed without slough. 1 mm x 41mm (Active)  ?09/25/21 2249  ?Location: Sacrum  ?Location Orientation: Medial;Upper  ?Staging: Stage 2 -  Partial thickness loss of dermis presenting as a shallow open injury with a red, pink wound bed without slough.  ?Wound Description (Comments): 1 mm x 48mm (Staged by DARA, RN)  ?Present on Admission: Yes  ? ? ?Physical Exam: ?Vital Signs ?Blood pressure 125/65, pulse 75, temperature 98.2 ?F (36.8 ?C), temperature source Oral, resp. rate 14, height 5\' 7"  (1.702 m), weight 89 kg, SpO2 94 %. ?Constitutional: No distress . Vital signs reviewed. BMI 32.49 ?HEENT: NCAT, EOMI, oral membranes moist ?Neck: supple ?Cardiovascular: RRR without murmur. No JVD    ?Respiratory/Chest: CTA Bilaterally without wheezes or rales. Normal effort    ?GI/Abdomen: BS +, non-tender, non-distended ?Ext:  no clubbing, cyanosis, or edema ?Psych: flat but cooperative  ?Skin: right IJ TDC, left foot with vac, sacral wound stage 2 ?Neuro/MSK: Alert. Lef foot in ace bandage with wound vac- strength not tested given nonweightbearing status. Impaired problem solving, delayed in general. Decreased sensation in left arm.  ?GU: incontinent of urine ? ?Assessment/Plan: ?1. Functional deficits which require 3+ hours per day of interdisciplinary therapy in a comprehensive inpatient rehab setting. ?Physiatrist is providing close team supervision  and 24 hour management of active medical problems listed below. ?Physiatrist and rehab team continue to assess barriers to discharge/monitor patient progress toward functional and medical goals ? ?Care Tool: ? ?Bathing ?   ?Body parts bathed by patient: Right arm, Left arm, Chest, Abdomen, Right upper leg, Left upper leg, Right lower leg, Face  ? Body parts bathed by helper: Buttocks, Right lower leg ?Body parts n/a: Left lower leg (cast) ?  ?Bathing assist Assist Level: Supervision/Verbal cueing ?  ?  ?Upper Body Dressing/Undressing ?Upper body dressing   ?What is the patient wearing?: Pull over shirt ?   ?Upper body assist Assist Level: Supervision/Verbal cueing ?   ?Lower Body Dressing/Undressing ?Lower body dressing ? ? ? Lower body dressing activity did not occur: Safety/medical concerns (Wound vac temp disconnected by nursing staff) ?What is the patient wearing?: Pants ? ?  ? ?Lower body assist Assist for lower body dressing: Moderate Assistance - Patient 50 - 74% ?   ? ?Toileting ?Toileting    ?Toileting assist Assist for toileting: Maximal Assistance - Patient 25 - 49% ?  ?  ?Transfers ?Chair/bed transfer ? ?Transfers assist ? Chair/bed transfer activity did not occur: Refused ? ?Chair/bed transfer assist level: Minimal Assistance - Patient > 75% (slideboard) ?  ?  ?Locomotion ?Ambulation ? ? ?Ambulation assist ? ? Ambulation activity did not occur: Refused ? ?  ?  ?   ? ?Walk 10 feet activity ? ? ?Assist ? Walk 10 feet activity did not occur: Refused ? ?  ?   ? ?Walk 50 feet activity ? ? ?Assist Walk 50 feet with 2 turns activity did not occur: Safety/medical concerns ? ?  ?   ? ? ?Walk 150 feet activity ? ? ?Assist Walk 150 feet activity did not occur: Safety/medical concerns ? ?  ?  ?  ? ?Walk 10 feet on uneven surface  ?activity ? ? ?Assist Walk 10 feet on uneven surfaces activity did not occur: Safety/medical concerns ? ? ?  ?   ? ?Wheelchair ? ? ? ? ?Assist Is the patient using a wheelchair?: Yes ?Type  of Wheelchair: Manual ?Wheelchair activity did not occur: Refused ? ?Wheelchair assist level: Supervision/Verbal cueing ?Max wheelchair distance: 39ft  ? ? ?Wheelchair 50 feet with 2 turns activity ? ? ? ?Assist ? ?  ?Wheelchair 50 feet with 2 turns activity did not occur: Refused ? ? ?   ? ?Wheelchair 150 feet activity  ? ? ? ?Assist ? Wheelchair 150 feet activity did not occur: Refused ? ? ?   ? ?Blood pressure 125/65, pulse 75, temperature 98.2 ?F (36.8 ?C), temperature source Oral, resp. rate 14, height 5\' 7"  (1.702 m), weight 89 kg, SpO2 94 %. ? ? ? Medical Problem List and Plan: ?1. Functional deficits secondary to left trimalleolar ankle fracture ?            -patient may not shower until wound vac removal. ?            -ELOS/Goals: 14-16 days  mod I ?           -Continue CIR therapies including PT, OT  ? -messaged SW to schedule caregiver training with son.  ? -XR reviewed and stable. Remove wound vac today and apply splint.  ?2.  Impaired mobility, nonambulatory:  continue Lovenox.  ?3. Postoperative pain: Change Tylenol BID to prn. Discontinue oxycodone. Discussed with patient. ?4. Bipolar disorder?: continue Latuda ?5. Neuropsych: This patient is capable of making decisions on her own behalf. ?6. Left lower extremity trimalleolar fracture: wound avc to be removed Wednesday 5/10 by ortho, discussed with nursing and patient ?7. Obesity: BMI 32.32 provide dietary education. Restart Semaglutide. 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 HTN: Monitor BP TID ?11. T2DM: Hgb A1C- 5.7 (down from 6.6  a couple of  months ago)--was on glucotrol and ozempic PTA ?            --CBG (last 3)  ?Recent Labs  ?  10/03/21 ?1726 10/03/21 ?2150 10/04/21 ?0647  ?GLUCAP 343* 247* 200*  ?   -  ssi ? Restart Semaglutide.--first dose 09/30/21 from home. Increase Semglutide to 2mg  ? -cover with ssi for now ? -change prosource to with meals.  ?12. Bipolar disorder: On Latuda, Trazodone and Prozac.   ?--Parkinsonism/EPS  managed with benztropine.  ?-might benefit from neuro-psych eval ?13. Anemia of chronic disease: continue to monitor Hgb with HD labs.  ?14. Screening for vitamin D deficiency: Vitamin D,

## 2021-10-04 NOTE — Progress Notes (Signed)
? ?  VASCULAR SURGERY ASSESSMENT & PLAN:  ? ?POD 1 FOR STAGE LEFT BASILIC VEIN TRANSPOSITION: Her fistula has an excellent thrill.  She has a palpable left radial pulse.  Her incision looks fine. ? ?Follow-up has been arranged in our office. ? ?Vascular surgery will be available as needed. ? ? ?SUBJECTIVE:  ? ?No complaints this morning. ? ?PHYSICAL EXAM:  ? ?Vitals:  ? 10/03/21 0926 10/03/21 1356 10/03/21 1936 10/04/21 0522  ?BP: (!) 131/59 (!) 148/71 127/86 125/65  ?Pulse: 70 83 85 75  ?Resp: 17 17 14 14   ?Temp: (!) 97.3 ?F (36.3 ?C) 99.2 ?F (37.3 ?C) 98.4 ?F (36.9 ?C) 98.2 ?F (36.8 ?C)  ?TempSrc:  Oral Oral Oral  ?SpO2: 91% 98% 94% 94%  ?Weight:      ?Height:      ? ?Her fistula has a good thrill. ?Her incision looks fine. ?She has a palpable left radial pulse. ? ?LABS:  ? ?CBG (last 3)  ?Recent Labs  ?  10/03/21 ?1726 10/03/21 ?2150 10/04/21 ?0647  ?GLUCAP 343* 247* 200*  ? ? ?PROBLEM LIST:   ? ?Principal Problem: ?  Ankle fracture, bimalleolar, closed, left, sequela ?Active Problems: ?  Pressure injury of skin ? ? ?CURRENT MEDS:  ? ? (feeding supplement) PROSource Plus  30 mL Oral BID WC  ? atorvastatin  40 mg Oral Q supper  ? benztropine  1 mg Oral QPM  ? Chlorhexidine Gluconate Cloth  6 each Topical BID  ? Chlorhexidine Gluconate Cloth  6 each Topical Q0600  ? darbepoetin (ARANESP) injection - DIALYSIS  60 mcg Intravenous Q Mon-HD  ? docusate sodium  100 mg Oral Daily  ? enoxaparin (LOVENOX) injection  30 mg Subcutaneous Q24H  ? FLUoxetine  60 mg Oral q morning  ? gabapentin  100 mg Oral Daily  ? insulin aspart  0-5 Units Subcutaneous QHS  ? insulin aspart  0-6 Units Subcutaneous TID WC  ? lurasidone  40 mg Oral QHS  ? metoprolol succinate  50 mg Oral Daily  ? multivitamin  1 tablet Oral QHS  ? prazosin  1 mg Oral QHS  ? protein supplement  1 Scoop Oral TID WC  ? [START ON 10/08/2021] Semaglutide (1 MG/DOSE)  1 mg Subcutaneous Weekly  ? traZODone  300 mg Oral QHS  ? ? ?Jessica Mercado ?Office:  [phone] ?10/04/2021 ? ?

## 2021-10-04 NOTE — Progress Notes (Signed)
Physical Therapy Session Note ? ?Patient Details  ?Name: Jessica Mercado ?MRN: [id number] ?Date of Birth: [birth year] ? ?Today's Date: 10/04/2021 ?PT Individual Time: 0805-0901 and 1100-1200 ?PT Individual Time Calculation (min): 56 min and 60 min ?And (Session 2) ?PT Amount of Missed Time(min): 15 min ?PT Missed Treatment Reason: Wound care; Other (Fiberglass hard cast application from [employer]) ? ?Short Term Goals: ?Week 1:  PT Short Term Goal 1 (Week 1): Patient will adhere to weight bearing precautions with all mobility at least 50% of the time ?PT Short Term Goal 2 (Week 1): Patient will transfer bed <> wc with LRAD and ModA ?PT Short Term Goal 3 (Week 1): Patient will complete sit <> stand with LRAD and CGA while maintaining wbng precautions ?PT Short Term Goal 4 (Week 1): Patient will initiate wc mob ? ?Skilled Therapeutic Interventions/Progress Updates:  ?Session 1: ?Patient supine in bed on entrance to room. Patient alert but not initially agreeable to PT session since she has been vomiting this morning. Pt complains that "therapy will come even when you're sick". Reminded pt every pt is "sick" and "that is the whole point of therapy - to help you get through your time of sickness and be stronger after recovery". ? ?Patient with abdominal muscle pain complaint throughout session 2/2 vomiting prior to session. Also with unrated LE pain. ? ?Therapeutic Activity: ?Education with pt re: need to participate in every session and adhere to the prescribed weight bearing limitations in order to decrease overall pain, promote proper healing. Requires education throughout therex for difference between traumatic/ harmful pain and healing pain. Abdominal pain noted with bed rolling requiring maxA.  ? ?Therapeutic Exercise: ?Patient performed supine therex with vc/ tc for proper technique. ?See below for exercises. All performed concentrically with hold in end range, then slow eccentric return. Pt requires brief rest  between sets of 10 reps. 2x10 throughout for pain and strengthening.  ?- ankle circles CW, CCW ?- heel slides ?- SL hip abd/ add (RLE min resistance; LLE AROM) ? ?Patient supine  in bed at end of session with brakes locked, bed alarm set, and all needs within reach. ? ?Session 2: ?Patient supine in bed on entrance to room. On initial entrance, ortho techs in room and removing soft cast to place hard cast. RN present and related pt's illness with emesis this morning. Patient alert and agreeable to PT session following placement of cast. Return 15 min later, and pt agreeable to therapy.  ? ?Patient with abdominal muscle soreness complaint throughout session. ? ?Therapeutic Activity: ?Bed Mobility: Pt complains that new cast is stuck to bed sheets and she cannot lift leg from bed. Patient performed supine --> sit with supervision requiring extra time and several efforts to fully complete. VC/ tc required for effort d/t pt c/o abdominal muscle soreness. For return to supine, verbal instructions provided for sit-->sidelying. Requires time to initiate and CGA for BLE to bed surface.  ? ?Sheets are wet from water application to cast and require changing to prevent cast from fully drying and deforming. Pt agreeable to use SB to leave bed for linen change so, SB transfer attempted. With pt unable to complete, she returns to supine and is assisted with Mod/ MaxA to sidelying in order to change sheets.  ? ?Transfers: Attempted slide board transfer with MaxA for placement. Pt initiating slide but is unable to complete d/t abdominal soreness after 2 attempts and relates she will not be sliding across board today.  ? ?Therapeutic  Exercise: ?Patient performed the following exercises while seated EOB with vc/ tc for proper technique. ?- marches 2x10 ?- LAQs with 5sec hold and slow, eccentric return 2x10 ?- RLE heel/ toe raises 2x10 ?Supine: ?- RLE bridging x5 ?- LLE SLR with AAROM ? ?Pt missed 15 min of skilled therapy due to  application of fiberglass hard cast. Will re-attempt as schedule and pt availability permits.  ? ?Patient supine  in bed at end of session with brakes locked, bed alarm set, and all needs within reach. ? ? ?Therapy Documentation ?Precautions:  ?Precautions ?Precautions: Fall ?Precaution Comments: wound vac removed 5/12 and hard cast applied ?Required Braces or Orthoses: Splint/Cast ?Splint/Cast: hard fiberglass cast ?Splint/Cast - Date Prophylactic Dressing Applied (if applicable): 15/94/58 ?Restrictions ?Weight Bearing Restrictions: Yes ?RLE Weight Bearing: Weight bearing as tolerated ?LLE Weight Bearing: Non weight bearing ?General: ?  ?Vital Signs: ?Therapy Vitals ?Temp: 98.2 ?F (36.8 ?C) ?Temp Source: Oral ?Pulse Rate: 75 ?Resp: 14 ?BP: 125/65 ?Patient Position (if appropriate): Lying ?Oxygen Therapy ?SpO2: 94 % ?O2 Device: Room Air ?Pain: ? Abdominal muscle soreness ?Exercises: ?General Exercises - Lower Extremity ?Ankle Circles/Pumps: 20 reps (RLE within range: CW/ CCW) ?Heel Slides: 20 reps ?Hip ABduction/ADduction: 20 reps ?Other Exercises ?Other Exercises: 20 reps each LE (resisted SL IR, AROM ER) ?Other Treatments: ?  ? ?Therapy/Group: Individual Therapy ? ?Alger Simons PT, DPT ?10/04/2021, 5:38 AM  ?

## 2021-10-04 NOTE — Plan of Care (Signed)
?  Problem: Consults ?Goal: RH GENERAL PATIENT EDUCATION ?Description: See Patient Education module for education specifics. ?Outcome: Progressing ?  ?Problem: RH BOWEL ELIMINATION ?Goal: RH STG MANAGE BOWEL WITH ASSISTANCE ?Description: STG Manage Bowel with mod I Assistance. ?Outcome: Progressing ?Goal: RH STG MANAGE BOWEL W/MEDICATION W/ASSISTANCE ?Description: STG Manage Bowel with Medication with mod I Assistance. ?Outcome: Progressing ?  ?Problem: RH BLADDER ELIMINATION ?Goal: RH STG MANAGE BLADDER WITH ASSISTANCE ?Description: STG Manage Bladder With cues Assistance ?Outcome: Progressing ?  ?Problem: RH SKIN INTEGRITY ?Goal: RH STG MAINTAIN SKIN INTEGRITY WITH ASSISTANCE ?Description: STG Maintain Skin Integrity With min Assistance. ?Outcome: Progressing ?Goal: RH STG ABLE TO PERFORM INCISION/WOUND CARE W/ASSISTANCE ?Description: STG Able To Perform Incision/Wound Care With min Assistance. ?Outcome: Progressing ?  ?Problem: RH SAFETY ?Goal: RH STG ADHERE TO SAFETY PRECAUTIONS W/ASSISTANCE/DEVICE ?Description: STG Adhere to Safety Precautions With cues Assistance/Device. ?Outcome: Progressing ?  ?Problem: RH PAIN MANAGEMENT ?Goal: RH STG PAIN MANAGED AT OR BELOW [name] PAIN GOAL ?Description: At or below level 4 with prns ?Outcome: Progressing ?  ?Problem: RH KNOWLEDGE DEFICIT GENERAL ?Goal: RH STG INCREASE KNOWLEDGE OF SELF CARE AFTER HOSPITALIZATION ?Description: Patient and son I law will be able to manage care at discharge using handouts and educational resources independently ?Outcome: Progressing ?  ?Problem: RH KNOWLEDGE DEFICIT ?Goal: RH STG INCREASE KNOWLEDGE OF DIABETES ?Description: Patient and SIL will be able to manage DM with medications and dietary modifications using handouts and educational resources independently ?Outcome: Progressing ?Goal: RH STG INCREASE KNOWLEDGE OF HYPERTENSION ?Description: Patient and SIL will be able to manage HTN with medications and dietary modifications using handouts  and educational resources independently ?Outcome: Progressing ?Goal: RH STG INCREASE KNOWLEGDE OF HYPERLIPIDEMIA ?Description: Patient and SIL will be able to manage HLD with medications and dietary modifications using handouts and educational resources independently ?Outcome: Progressing ?  ?

## 2021-10-04 NOTE — Progress Notes (Signed)
Orthopaedic Trauma Service  ? ?S: ?Pt seen in dialysis  ?Short leg cast applied to L leg  ?Pt did not recall that she broke her ankle or had surgery  ? ?O: ?BP (!) 147/83   Pulse 77   Temp 98.6 ?F (37 ?C) (Oral)   Resp 16   Ht 5\' 7"  (1.702 m)   Wt 86.6 kg   LMP  (LMP Unknown)   SpO2 90% Comment: giving 2L via nasal cannula  BMI 29.90 kg/m?  ? ?Gen: in bed, on HD ?Left leg: ? SLC looks great ? Well padded ? Good cuff proximally and distally ? Cast is not too tight ? Moves toes without difficulty  ? Ext warm  ? ? ?Xrays: 3 view L ankle shows stable alignment of ankle fracture. All hardware is stable including tibiotalocalcanueal pins x 2 ? ?A/P ? ?61 y/o female s/p ground level fall with L trimalleolar ankle fracture dislocation  ? ?Now 16 days pos op  ? ?  ?Weightbearing: NWB LLE x 6 more weeks ?ROM: unrestricted L knee ROM, no ankle ROM as pt casted  ?Insicional and dressing care: prevena incisional vac removed today and SLC applied.  Cast to remain on for 2-3 weeks. Ok to shower/bathe but DO NOT get cast wet.  Can use garbage bag secured with tape or purchase a cast cover at a medical supply store/amazon/drug store such as [facility]  ?Impediments to Fracture Healing: renal disease.  Clinically very  poor bone quality  ?Bone Health/Optimization: address renal disease.  Will need DEXA as outpt  ?  ?VTE prophylaxis: continue with lovenox.  Upon dc from CIR can be discharged without pharmacologic prophylaxis as she would have received appropriate duration of coverage from her acute hospital stay and CIR stay  ?  ?Dispo: Ortho issues stable ?           ?            Discussed with great detail with the pt the importance of maintaining strict nonweightbearing on her leg for the next 8 weeks.  Premature weightbearing can lead to catastrophic hardware failure and loss of reduction  ?  ?  ?Follow - up plan: 2 weeks ?  ?Contact information:  [place] MD, Ainsley Spinner PA-C ? ? ?Patient ID: Jessica Mercado, female   DOB:  08-12-1960, 61 y.o.   MRN: [id number] ? ?

## 2021-10-05 LAB — GLUCOSE, CAPILLARY
Glucose-Capillary: 199 mg/dL — ABNORMAL HIGH (ref 70–99)
Glucose-Capillary: 209 mg/dL — ABNORMAL HIGH (ref 70–99)
Glucose-Capillary: 202 mg/dL — ABNORMAL HIGH (ref 70–99)
Glucose-Capillary: 205 mg/dL — ABNORMAL HIGH (ref 70–99)

## 2021-10-05 MED ORDER — SORBITOL 70 % SOLN
30.0000 mL | Freq: Every day | Status: DC | PRN
  Administered 2021-10-05: 30 mL via ORAL
  Filled 2021-10-05: qty 30

## 2021-10-05 MED ORDER — POLYETHYLENE GLYCOL 3350 17 G PO PACK
17.0000 g | PACK | Freq: Two times a day (BID) | ORAL | Status: DC | PRN
  Administered 2021-10-05: 17 g via ORAL
  Filled 2021-10-05: qty 1

## 2021-10-05 NOTE — Progress Notes (Signed)
Patient ID: Jessica Mercado, female   DOB: February 03, 1961, 61 y.o.   MRN: [id number] ? ?[facility] ?Progress Note  ? ?Assessment/ Plan:   ?1. Falls/syncope/left trimalleolar ankle fracture: The fall and syncope was likely related to worsening uremia and she is undergoing evaluation by orthopedic surgery for management of her trimalleolar ankle fracture.  Admitted to inpatient rehabilitation for continued strengthening to facilitate eventual discharge home. ? ?2. 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  She is s/p left arm brachial artery to basilic vein AVF on 5/27 with Dr. Donzetta Matters ?- HD per MWF schedule  ?- Note she will have a MWF schedule upon discharge at [facility] ? ?3. 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  Increased aranesp to 100 mcg weekly on mondays.  Status post intravenous iron. ? 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 PTH low at 74.   ? ?5. Hypertension: added back prazosin  ? ?Dispo - spoke with HD SW - she is potentially for d/c on 5/18.  HD SW has alerted clinic to a potential start date of 5/19  ? ? ? ?Subjective:   ?Last HD on 5/12 with 1.9 liters UF.  Feels ok today.  Excited to be hopefully headed home on 5/18.   ? ?Review of systems:    ?Denies n/v ?Denies chest pain or shortness of breath ?Eating ok  ? ?Objective:   ?BP 140/77 (BP Location: Right Arm)   Pulse 90   Temp 99 ?F (37.2 ?C) (Oral)   Resp 14   Ht 5\' 7"  (1.702 m)   Wt 83.5 kg   LMP  (LMP Unknown)   SpO2 94%   BMI 28.83 kg/m?  ? ?Physical Exam:     ?General adult female in bed in no acute distress ?HEENT normocephalic atraumatic extraocular movements intact sclera anicteric ?Neck supple trachea midline ?Lungs clear to auscultation bilaterally normal work of breathing at rest  ?Heart S1S2 no rub ?Abdomen soft nontender  nondistended ?Extremities no edema; left foot in cast ?Psych normal mood and affect ?Access tunneled HD catheter in place; LUE AVF with bruit - swelling improved ?Labs: ?BMET ?Recent Labs  ?Lab 09/30/21 ?1231 10/02/21 ?7824 10/04/21 ?2353  ?NA 132* 131* 129*  ?K 4.0 3.8 4.0  ?CL 98 100 92*  ?CO2 24 26 27   ?GLUCOSE 268* 147* 191*  ?BUN 43* 26* 24*  ?CREATININE 6.40* 4.66* 4.87*  ?CALCIUM 8.3* 8.1* 8.6*  ?PHOS 5.7* 4.2 4.2  ? ?CBC ?Recent Labs  ?Lab 09/30/21 ?1231  ?WBC 8.4  ?HGB 9.1*  ?HCT 28.7*  ?MCV 95.0  ?PLT 192  ? ? ?  ?Medications:   ? ? (feeding supplement) PROSource Plus  30 mL Oral BID WC  ? atorvastatin  40 mg Oral Q supper  ? benztropine  1 mg Oral QPM  ? Chlorhexidine Gluconate Cloth  6 each Topical BID  ? Chlorhexidine Gluconate Cloth  6 each Topical Q0600  ? [START ON 10/07/2021] darbepoetin (ARANESP) injection - DIALYSIS  100 mcg Intravenous Q Mon-HD  ? docusate sodium  100 mg Oral Daily  ? enoxaparin (LOVENOX) injection  30 mg Subcutaneous Q24H  ? FLUoxetine  60 mg Oral q morning  ? gabapentin  100 mg Oral Daily  ? insulin aspart  0-5 Units Subcutaneous QHS  ? insulin aspart  0-6 Units Subcutaneous TID WC  ? lurasidone  40 mg Oral QHS  ? metoprolol succinate  50 mg Oral Daily  ? multivitamin  1 tablet Oral QHS  ? prazosin  1 mg Oral QHS  ? protein supplement  1 Scoop Oral TID WC  ? [START ON 10/08/2021] Semaglutide (1 MG/DOSE)  2 mg Subcutaneous Weekly  ? traZODone  300 mg Oral QHS  ? ?Claudia Desanctis, MD ?10/05/2021, 1:17 PM ? ? ? ? ? ? ?

## 2021-10-05 NOTE — Progress Notes (Signed)
Physical Therapy Session Note ? ?Patient Details  ?Name: Jessica Mercado ?MRN: [id number] ?Date of Birth: [birth year] ? ?Today's Date: 10/05/2021 ?PT Individual Time: 1601-0932 ?PT Individual Time Calculation (min): 54 min  ? ?Short Term Goals: ?Week 1:  PT Short Term Goal 1 (Week 1): Patient will adhere to weight bearing precautions with all mobility at least 50% of the time ?PT Short Term Goal 2 (Week 1): Patient will transfer bed <> wc with LRAD and ModA ?PT Short Term Goal 3 (Week 1): Patient will complete sit <> stand with LRAD and CGA while maintaining wbng precautions ?PT Short Term Goal 4 (Week 1): Patient will initiate wc mob ?Week 2:    ? ?Skilled Therapeutic Interventions/Progress Updates:  ?Patient supine in bed on entrance to room. RLE hanging off EOB. Patient alert and agreeable to PT session with discussion and time. Pt initially disoriented to time and not understanding why PT was present in evening. Oriented pt to 8am vs 8pm and upcoming 11am appointment with OT. Requires time to orient.  ? ?During session, NP from PM&R entered room to round on pt. Related that she would return after therapy session.  ? ?Patient with no pain complaint at start of session. But does complain re: nausea from earlier in morning that she does not feel at time of start of session.  ? ?Therapeutic Activity: ?Bed Mobility: Patient performed supine --> sit with light CGA. VC/ tc required for technique. Pointed out to pt that she was able to perform with no assist to reach change in position in order to bring awareness. At end of session, pt requires light MinA to bring BLE to bed surface and rolls to back despite vc to come to sidelying. MinA for positioning in bed. Pt is able to perform scoot toward [facility] with bridging technique and ModA to prevent use of LLE.  ?Transfers: Patient performed SB transfer bed <> w/c with MaxA for placement and MinA for scoot. Provided verbal/ visual cues for continuing effort, completion of transfer/  positioning on board compared to goal position, maintaining WB compliance, and technique. ? ?While seated in w/c, guided pt in 2x10 w/c push-ups with guard to LLE for NWB compliance. Performed to advance strengthening for future sit<>stands. ? ?Pt relates desire to go outside and this PT related need to start to perform sit<>stand to RW, need for shoes and warmer clothing in order to enjoy time outside, but would relate desire to treating therapist.  ? ?Patient supine  in bed at end of session with brakes locked, bed alarm set, and all needs within reach. Oriented to time and time of next therapy session. Reminded of return of PM&R NP at any time.  ? ? ?Therapy Documentation ?Precautions:  ?Precautions ?Precautions: Fall ?Precaution Comments: wound vac removed 5/12 and hard cast applied ?Required Braces or Orthoses: Splint/Cast ?Splint/Cast: hard fiberglass cast ?Splint/Cast - Date Prophylactic Dressing Applied (if applicable): 35/57/32 ?Restrictions ?Weight Bearing Restrictions: Yes ?RLE Weight Bearing: Weight bearing as tolerated ?LLE Weight Bearing: Non weight bearing ?General: ?  ?Vital Signs: ?Therapy Vitals ?Temp: 99 ?F (37.2 ?C) ?Temp Source: Oral ?Pulse Rate: 90 ?Resp: 14 ?BP: 140/77 ?Patient Position (if appropriate): Lying ?Oxygen Therapy ?SpO2: 94 % ?O2 Device: Room Air ?Pain: ? Mild abdominal muscle soreness related during SB transfers with no other complaint during session.  ? ?Therapy/Group: Individual Therapy ? ?Alger Simons PT, DPT ?10/05/2021, 8:49 AM  ?

## 2021-10-05 NOTE — Progress Notes (Signed)
Occupational Therapy Session Note ? ?Patient Details  ?Name: Jessica Mercado ?MRN: [id number] ?Date of Birth: 1961/03/29 ? ?Today's Date: 10/05/2021 ?OT Individual Time: 1101-1200 ?OT Individual Time Calculation (min): 59 min  ? ? ?Short Term Goals: ?Week 1:  OT Short Term Goal 1 (Week 1): Patient will be able to transfer to [facility] with mod a and cues ?OT Short Term Goal 1 - Progress (Week 1): Met ?OT Short Term Goal 2 (Week 1): Patient will  demonstrate ability to stand long enough to pull up/down LB clothing with min assist ?OT Short Term Goal 2 - Progress (Week 1): Discontinued (comment) (d/c standing goals 2/2 poor NWB adherence during standing transfers) ?OT Short Term Goal 3 (Week 1): Patient will maintain LLE NWB status with functional mobility tasks ?OT Short Term Goal 3 - Progress (Week 1): Progressing toward goal ?OT Short Term Goal 4 (Week 1): Patient will tolerate upper body strengthening exercises to allow increased use of UE  on walker for functioanl mobility ?OT Short Term Goal 4 - Progress (Week 1): Met ? ?Skilled Therapeutic Interventions/Progress Updates: Patient received resting in bed. Agreeable to OT treatment with focus on functional transfers in preparation for discharge at w/c level. Worked on steps for [employer] transfer. Patient reports not liking slideboard, but seemed to understand the need with NWB LE and the ability to use the board to bridge gaps between things like the w/c and car if family will be transporting to appointments. Able to get to EOB with SPV and vc's. Once EOB patient able to scoot to the left with therapist assist to maintain NWB. When not holding patients LE she had difficulty not pressing through her left LE. Patient transferred with min assist over all. Once in w/c patient able to propel w/c appoximately 50 feet completing three turns. She required several rest breaks to complete path. Patient educated on home safety and set up to allow for independence at a w/c level.  She states she had a very supportive family member, [name], that will assist as needed. Patient will need continued education on NWB as she displayed difficulty maintaining NWB during transfers and repositioning, without therapist intervention.  Continue will skill OT intervention to address safety and independence with ADL's  and functional mobility. ?   ? ?Therapy Documentation ?Precautions:  ?Precautions ?Precautions: Fall ?Precaution Comments: wound vac removed 5/12 and hard cast applied ?Required Braces or Orthoses: Splint/Cast ?Splint/Cast: hard fiberglass cast ?Splint/Cast - Date Prophylactic Dressing Applied (if applicable): 50/09/38 ?Restrictions ?Weight Bearing Restrictions: Yes ?RLE Weight Bearing: Weight bearing as tolerated ?LLE Weight Bearing: Non weight bearing ? ?  ?Pain:No c/o pain ?  ?ADL: ?ADL ?Eating: Set up ?Where Assessed-Eating: Bed level ?Grooming: Setup ?Where Assessed-Grooming: Edge of bed ?Upper Body Bathing: Setup ?Where Assessed-Upper Body Bathing: Edge of bed ?Lower Body Bathing: Moderate assistance ?Where Assessed-Lower Body Bathing: Edge of bed ?Upper Body Dressing: Setup ?Where Assessed-Upper Body Dressing: Edge of bed ?Lower Body Dressing: Moderate cueing ?Where Assessed-Lower Body Dressing: Edge of bed ?Toileting: Not assessed ?Toilet Transfer: Not assessed ?Tub/Shower Transfer: Not assessed (Unable to shower - wound vac) ? ? ? ?Therapy/Group: Individual Therapy ? ?Hermina Barters ?10/05/2021, 12:02 PM ?

## 2021-10-05 NOTE — Progress Notes (Addendum)
PROGRESS NOTE   Subjective/Complaints: Wound vac discontinued yesterday, currently in short leg cast.  Had dialysis late yesterday, with be on a MWF schedule.  Patient is new to dialysis and has some nausea yesterday, possibly related to dialysis.  ROS: Patient denies fever, rash, sore throat, blurred vision, dizziness, diarrhea, cough chest pain, shortness of breath, joint back or neck pain headache or change in mood.  Positive for nausea, constipation, and left arm numbness which is improving.  Objective:   DG Ankle Complete Left  Result Date: 10/04/2021 CLINICAL DATA:  Postoperative ankle ORIF EXAM: LEFT ANKLE COMPLETE - 3+ VIEW COMPARISON:  Left ankle radiographs 09/17/2021 FINDINGS: Redemonstration of overlying cast material which limits evaluation fine bony detail. Redemonstration of single long screw fixation of comminuted medial malleolar fracture. Unchanged mild diastasis of the fracture components. Redemonstration of distal fibular plate and screw fixation. Redemonstration of 2 pins from plantar approach traversing the posterior and mid subtalar joints and tibiotalar joint. Unchanged mildly displaced posterior malleolar fracture. Small plantar calcaneal heel spur. Mild spurring at the Achilles insertion on the calcaneus. IMPRESSION: 1. No significant change in alignment of trimalleolar fractures. 2. Status post medial and lateral malleolar ORIF and tibiotalar and subtalar pin fixation. Electronically Signed   By: Neita Garnet M.D.   On: 10/04/2021 10:18    No results for input(s): WBC, HGB, HCT, PLT in the last 72 hours.  Recent Labs    10/04/21 0554  NA 129*  K 4.0  CL 92*  CO2 27  GLUCOSE 191*  BUN 24*  CREATININE 4.87*  CALCIUM 8.6*    Intake/Output Summary (Last 24 hours) at 10/05/2021 1617 Last data filed at 10/05/2021 1246 Gross per 24 hour  Intake 240 ml  Output 1871 ml  Net -1631 ml     Pressure Injury  09/25/21 Sacrum Medial;Upper Stage 2 -  Partial thickness loss of dermis presenting as a shallow open injury with a red, pink wound bed without slough. 1 mm x 2mm (Active)  09/25/21 2249  Location: Sacrum  Location Orientation: Medial;Upper  Staging: Stage 2 -  Partial thickness loss of dermis presenting as a shallow open injury with a red, pink wound bed without slough.  Wound Description (Comments): 1 mm x 2mm (Staged by DARA, RN)  Present on Admission: Yes    Physical Exam: Vital Signs Blood pressure 115/66, pulse 74, temperature 98 F (36.7 C), resp. rate 17, height  (1.702 m), weight 83.5 kg, SpO2 94 %. Constitutional: No distress . Vital signs reviewed. BMI 32.49 HEENT: NCAT, EOMI, oral membranes moist Neck: supple Cardiovascular: RRR without murmur. No JVD    Respiratory/Chest: CTA Bilaterally without wheezes or rales. Normal effort    GI/Abdomen: BS +, non-tender, non-distended Ext: no clubbing, cyanosis, or edema Psych: flat but cooperative  Skin: right IJ TDC, left foot with vac, sacral wound stage 2 Neuro/MSK: Alert. Left foot in short leg cast, nonweightbearing status. Impaired problem solving, delayed in general. Decreased sensation in left arm is improving with nerve block wearing off. GU: anuric, dialysis MWF  Assessment/Plan: 1. Functional deficits which require 3+ hours per day of interdisciplinary therapy in a comprehensive inpatient rehab setting. Physiatrist  is providing close team supervision and 24 hour management of active medical problems listed below. Physiatrist and rehab team continue to assess barriers to discharge/monitor patient progress toward functional and medical goals  Care Tool:  Bathing    Body parts bathed by patient: Right arm, Left arm, Chest, Abdomen, Right upper leg, Left upper leg, Right lower leg, Face   Body parts bathed by helper: Buttocks, Right lower leg Body parts n/a: Left lower leg (cast)   Bathing assist Assist Level:  Supervision/Verbal cueing     Upper Body Dressing/Undressing Upper body dressing   What is the patient wearing?: Pull over shirt    Upper body assist Assist Level: Supervision/Verbal cueing    Lower Body Dressing/Undressing Lower body dressing    Lower body dressing activity did not occur: Safety/medical concerns (Wound vac temp disconnected by nursing staff) What is the patient wearing?: Incontinence brief     Lower body assist Assist for lower body dressing: Maximal Assistance - Patient 25 - 49%     Toileting Toileting Toileting Activity did not occur (Clothing management and hygiene only): N/A (no void or bm)  Toileting assist Assist for toileting: Maximal Assistance - Patient 25 - 49%     Transfers Chair/bed transfer  Transfers assist  Chair/bed transfer activity did not occur: Refused  Chair/bed transfer assist level: Minimal Assistance - Patient > 75%     Locomotion Ambulation   Ambulation assist   Ambulation activity did not occur: Refused          Walk 10 feet activity   Assist  Walk 10 feet activity did not occur: Refused        Walk 50 feet activity   Assist Walk 50 feet with 2 turns activity did not occur: Safety/medical concerns         Walk 150 feet activity   Assist Walk 150 feet activity did not occur: Safety/medical concerns         Walk 10 feet on uneven surface  activity   Assist Walk 10 feet on uneven surfaces activity did not occur: Safety/medical concerns         Wheelchair     Assist Is the patient using a wheelchair?: Yes Type of Wheelchair: Manual Wheelchair activity did not occur: Refused  Wheelchair assist level: Supervision/Verbal cueing Max wheelchair distance: 44ft    Wheelchair 50 feet with 2 turns activity    Assist    Wheelchair 50 feet with 2 turns activity did not occur: Refused   Assist Level: Supervision/Verbal cueing   Wheelchair 150 feet activity     Assist   Wheelchair 150 feet activity did not occur: Refused       Blood pressure 115/66, pulse 74, temperature 98 F (36.7 C), resp. rate 17, height  (1.702 m), weight 83.5 kg, SpO2 94 %.    Medical Problem List and Plan: 1. Functional deficits secondary to left trimalleolar ankle fracture             -patient may not shower until wound vac removal.             -ELOS/Goals: 14-16 days mod I            -Continue CIR therapies including PT, OT   -messaged SW to schedule caregiver training with son.   -XR reviewed and stable. Remove wound vac today and apply splint.  -5/13 Wound vac d/c, now in short leg cast 2.  Impaired mobility, nonambulatory:  continue Lovenox.  -5/13 Continue  use of Lovenox 3. Postoperative pain: Change Tylenol BID to prn. Discontinue oxycodone. Discussed with patient. 4. Bipolar disorder?: continue Latuda 5. Neuropsych: This patient is capable of making decisions on her own behalf. 6. Left lower extremity trimalleolar fracture: wound avc to be removed Wednesday 5/10 by ortho, discussed with nursing and patient -5/13 Wound vac d/c, now in short leg cast 7. Obesity: BMI 32.32 provide dietary education. Restart Semaglutide. Discussed with patient 9. 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 HTN: Monitor BP TID 5/13 Prazosin added back per nephrology, 5/10/2, BP variable and can reahc 140-150's, monitor and trend    10/05/2021    1:49 PM 10/05/2021    5:35 AM 10/05/2021    4:36 AM  Vitals with BMI  Weight   184 lbs 1 oz  BMI   28.82  Systolic 115 140   Diastolic 66 77   Pulse 74 90     11. T2DM: Hgb A1C- 5.7 (down from 6.6  a couple of  months ago)--was on glucotrol and ozempic PTA             --CBG (last 3)  Recent Labs    10/04/21 1949 10/05/21 0626 10/05/21 1152  GLUCAP 223* 199* 209*     -  ssi  Restart Semaglutide.--first dose 09/30/21 from home. Increase Semglutide to 2mg   -cover with ssi for now  -change prosource to with meals.  5/13 Continue to  monitor CBG's 199-223 12. Bipolar disorder: On Latuda, Trazodone and Prozac.  --Parkinsonism/EPS  managed with benztropine.  -might benefit from neuro-psych eval 13. Anemia of chronic disease: continue to monitor Hgb with HD labs.  14. Screening for vitamin D deficiency: Vitamin D, 25 hydroxyl-: Level high normal @ 93.97. Does not need supplementation 15. Hyponatremia: normalized, continue to monitor Na with HD labs 16. Incontinent of urine: bladder program ordered.  17. Poor po intake  5/7 -reached out nephro. Ok for reg CM diet 18. Constipation: continue colace daily.  19. Obesity BMI 32.42: change prosource to with meals.  20. Insomnia: continue trazodone -5/13 sleeping well. 21. Left arm numbness: discussed that nerve block should wear off today -5/13 Improving today, barely noticeable 22. Nausea/vomiting 5/13 Pt had a bad 24-48 hrs with nausea and vomiting, possible due to adjusting to dialysis and anesthesia from new AV fistula.  Reports that this am still present bit better.  LOS: 10 days A FACE TO FACE EVALUATION WAS PERFORMED  Tressia Miners 10/05/2021, 4:17 PM

## 2021-10-06 LAB — CBC
HCT: 29.9 % — ABNORMAL LOW (ref 36.0–46.0)
Hemoglobin: 9.3 g/dL — ABNORMAL LOW (ref 12.0–15.0)
MCH: 29.6 pg (ref 26.0–34.0)
MCHC: 31.1 g/dL (ref 30.0–36.0)
MCV: 95.2 fL (ref 80.0–100.0)
Platelets: 218 10*3/uL (ref 150–400)
RBC: 3.14 MIL/uL — ABNORMAL LOW (ref 3.87–5.11)
RDW: 16.8 % — ABNORMAL HIGH (ref 11.5–15.5)
WBC: 10.5 10*3/uL (ref 4.0–10.5)
nRBC: 0 % (ref 0.0–0.2)

## 2021-10-06 LAB — RENAL FUNCTION PANEL
Albumin: 2.6 g/dL — ABNORMAL LOW (ref 3.5–5.0)
Anion gap: 10 (ref 5–15)
BUN: 25 mg/dL — ABNORMAL HIGH (ref 6–20)
CO2: 25 mmol/L (ref 22–32)
Calcium: 8.6 mg/dL — ABNORMAL LOW (ref 8.9–10.3)
Chloride: 94 mmol/L — ABNORMAL LOW (ref 98–111)
Creatinine, Ser: 4.74 mg/dL — ABNORMAL HIGH (ref 0.44–1.00)
GFR, Estimated: 10 mL/min — ABNORMAL LOW (ref >60)
Glucose, Bld: 179 mg/dL — ABNORMAL HIGH (ref 70–99)
Phosphorus: 3.9 mg/dL (ref 2.5–4.6)
Potassium: 4.3 mmol/L (ref 3.5–5.1)
Sodium: 129 mmol/L — ABNORMAL LOW (ref 135–145)

## 2021-10-06 LAB — GLUCOSE, CAPILLARY
Glucose-Capillary: 186 mg/dL — ABNORMAL HIGH (ref 70–99)
Glucose-Capillary: 211 mg/dL — ABNORMAL HIGH (ref 70–99)
Glucose-Capillary: 177 mg/dL — ABNORMAL HIGH (ref 70–99)
Glucose-Capillary: 225 mg/dL — ABNORMAL HIGH (ref 70–99)

## 2021-10-06 MED ORDER — CHLORHEXIDINE GLUCONATE CLOTH 2 % EX PADS
6.0000 | MEDICATED_PAD | Freq: Every day | CUTANEOUS | Status: DC
  Administered 2021-10-07 – 2021-10-10 (×4): 6 via TOPICAL

## 2021-10-06 NOTE — Progress Notes (Signed)
PROGRESS NOTE   Subjective/Complaints: Wound vac discontinued on 10/04/21, currently in short leg cast.  Had dialysis late on Friday, will be on a MWF schedule.  Patient is new to dialysis and has some nausea possibly related to dialysis.  Today, she reports no nausea.  ROS: Patient denies fever, rash, sore throat, blurred vision, dizziness, diarrhea, nausea, cough chest pain, shortness of breath, joint back or neck pain headache or change in mood. Positive for constipation, relieved with sorbitol. Objective:   No results found.  Recent Labs    10/06/21 0611  WBC 10.5  HGB 9.3*  HCT 29.9*  PLT 218    Recent Labs    10/04/21 0554 10/06/21 0611  NA 129* 129*  K 4.0 4.3  CL 92* 94*  CO2 27 25  GLUCOSE 191* 179*  BUN 24* 25*  CREATININE 4.87* 4.74*  CALCIUM 8.6* 8.6*    Intake/Output Summary (Last 24 hours) at 10/06/2021 1235 Last data filed at 10/06/2021 0700 Gross per 24 hour  Intake 395 ml  Output --  Net 395 ml     Pressure Injury 09/25/21 Sacrum Medial;Upper Stage 2 -  Partial thickness loss of dermis presenting as a shallow open injury with a red, pink wound bed without slough. 1 mm x 2mm (Active)  09/25/21 2249  Location: Sacrum  Location Orientation: Medial;Upper  Staging: Stage 2 -  Partial thickness loss of dermis presenting as a shallow open injury with a red, pink wound bed without slough.  Wound Description (Comments): 1 mm x 2mm (Staged by DARA, RN)  Present on Admission: Yes    Physical Exam: Vital Signs Blood pressure 134/71, pulse 72, temperature 98 F (36.7 C), temperature source Oral, resp. rate 17, height  (1.702 m), weight 83.5 kg, SpO2 95 %. Constitutional: No distress . Vital signs reviewed. BMI 32.49 HEENT: NCAT, EOMI, oral membranes moist Neck: supple Cardiovascular: RRR without murmur. No JVD    Respiratory/Chest: CTA Bilaterally without wheezes or rales. Normal effort     GI/Abdomen: BS +, non-tender, non-distended, LBM 10/05/21 Ext: no clubbing, cyanosis, or edema Psych: flat but cooperative  Skin: right IJ TDC, left foot with vac, sacral wound stage 2 Neuro/MSK: Alert. Left foot in short leg cast, nonweightbearing status. Impaired problem solving, delayed in general. Decreased sensation in left arm, resolved, likely from nerve block. GU: anuric, dialysis MWF  Assessment/Plan: 1. Functional deficits which require 3+ hours per day of interdisciplinary therapy in a comprehensive inpatient rehab setting. Physiatrist is providing close team supervision and 24 hour management of active medical problems listed below. Physiatrist and rehab team continue to assess barriers to discharge/monitor patient progress toward functional and medical goals  Care Tool:  Bathing    Body parts bathed by patient: Right arm, Left arm, Chest, Abdomen, Right upper leg, Left upper leg, Right lower leg, Face   Body parts bathed by helper: Buttocks, Right lower leg Body parts n/a: Left lower leg (cast)   Bathing assist Assist Level: Supervision/Verbal cueing     Upper Body Dressing/Undressing Upper body dressing   What is the patient wearing?: Pull over shirt    Upper body assist Assist Level: Supervision/Verbal cueing  Lower Body Dressing/Undressing Lower body dressing    Lower body dressing activity did not occur: Safety/medical concerns (Wound vac temp disconnected by nursing staff) What is the patient wearing?: Incontinence brief     Lower body assist Assist for lower body dressing: Maximal Assistance - Patient 25 - 49%     Toileting Toileting Toileting Activity did not occur (Clothing management and hygiene only): N/A (no void or bm)  Toileting assist Assist for toileting: Maximal Assistance - Patient 25 - 49%     Transfers Chair/bed transfer  Transfers assist  Chair/bed transfer activity did not occur: Refused  Chair/bed transfer assist level: Minimal  Assistance - Patient > 75%     Locomotion Ambulation   Ambulation assist   Ambulation activity did not occur: Refused          Walk 10 feet activity   Assist  Walk 10 feet activity did not occur: Refused        Walk 50 feet activity   Assist Walk 50 feet with 2 turns activity did not occur: Safety/medical concerns         Walk 150 feet activity   Assist Walk 150 feet activity did not occur: Safety/medical concerns         Walk 10 feet on uneven surface  activity   Assist Walk 10 feet on uneven surfaces activity did not occur: Safety/medical concerns         Wheelchair     Assist Is the patient using a wheelchair?: Yes Type of Wheelchair: Manual Wheelchair activity did not occur: Refused  Wheelchair assist level: Supervision/Verbal cueing Max wheelchair distance: 74ft    Wheelchair 50 feet with 2 turns activity    Assist    Wheelchair 50 feet with 2 turns activity did not occur: Refused   Assist Level: Supervision/Verbal cueing   Wheelchair 150 feet activity     Assist  Wheelchair 150 feet activity did not occur: Refused       Blood pressure 134/71, pulse 72, temperature 98 F (36.7 C), temperature source Oral, resp. rate 17, height  (1.702 m), weight 83.5 kg, SpO2 95 %.    Medical Problem List and Plan: 1. Functional deficits secondary to left trimalleolar ankle fracture             -patient may not shower until wound vac removal.             -ELOS/Goals: 14-16 days mod I            -Continue CIR therapies including PT, OT   -messaged SW to schedule caregiver training with son.   -XR reviewed and stable. Remove wound vac today and apply splint.  -5/13 Wound vac d/c, now in short leg cast 2.  Impaired mobility, nonambulatory:  continue Lovenox.  -5/13 Continue use of Lovenox 3. Postoperative pain: Change Tylenol BID to prn. Discontinue oxycodone. Discussed with patient. 4. Bipolar disorder?: continue Latuda 5.  Neuropsych: This patient is capable of making decisions on her own behalf. 6. Left lower extremity trimalleolar fracture: wound avc to be removed Wednesday 5/10 by ortho, discussed with nursing and patient -5/13 Wound vac d/c, now in short leg cast 5/14 doing well with short leg cast 7. Obesity: BMI 32.32 provide dietary education. Restart Semaglutide. Discussed with patient 9. 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 HTN: Monitor BP TID 5/13 Prazosin added back per nephrology, 10/02/21, BP variable and can reahc 140-150's, monitor and trend 5/14 SBP 130's-140's, CTM.    10/06/2021  4:23 AM 10/05/2021    8:10 PM 10/05/2021    1:49 PM  Vitals with BMI  Systolic 134 140 161  Diastolic 71 65 66  Pulse 72 68 74    11. T2DM: Hgb A1C- 5.7 (down from 6.6  a couple of  months ago)--was on glucotrol and ozempic PTA             --CBG (last 3)  Recent Labs    10/05/21 2109 10/06/21 0619 10/06/21 1155  GLUCAP 205* 186* 211*     -  ssi  Restart Semaglutide.--first dose 09/30/21 from home. Increase Semglutide to 2mg   -cover with ssi for now  -change prosource to with meals.  5/13 Continue to monitor CBG's 199-223 5/14 Continue 2 mg Semglutide and SSI 12. Bipolar disorder: On Latuda, Trazodone and Prozac.  --Parkinsonism/EPS  managed with benztropine.  -might benefit from neuro-psych eval 13. Anemia of chronic disease: continue to monitor Hgb with HD labs.  14. Screening for vitamin D deficiency: Vitamin D, 25 hydroxyl-: Level high normal @ 93.97. Does not need supplementation 15. Hyponatremia: normalized, continue to monitor Na with HD labs 16. Incontinent of urine: bladder program ordered.  17. Poor po intake  5/7 -reached out nephro. Ok for reg CM diet 18. Constipation: continue colace daily.  19. Obesity BMI 32.42: change prosource to with meals.  20. Insomnia: continue trazodone -5/13 sleeping well. 21. Left arm numbness: discussed that nerve block should wear off today -5/13 Improving  today, barely noticeable 5/14 left arm numbness resolved. 22. Nausea/vomiting 5/13 Pt had a bad 24-48 hrs with nausea and vomiting, possible due to adjusting to dialysis and anesthesia from new AV fistula.  Reports that this am still present bit better. 5/14 No issues with nausea or vomiting today, resolved. 23. Constipation -5/14 LBM  evening 10/05/21, relieved with 70 % sorbitol  LOS: 11 days A FACE TO FACE EVALUATION WAS PERFORMED  Tressia Miners 10/06/2021, 12:35 PM

## 2021-10-06 NOTE — Progress Notes (Signed)
Patient ID: Jessica Mercado, female   DOB: 07-10-1960, 61 y.o.   MRN: [id number] ? ?[facility] ?Progress Note  ? ?Assessment/ Plan:   ?1. Falls/syncope/left trimalleolar ankle fracture: The fall and syncope was likely related to worsening uremia and she is undergoing evaluation by orthopedic surgery for management of her trimalleolar ankle fracture.  Admitted to inpatient rehabilitation for continued strengthening to facilitate eventual discharge home. ? ?2. 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  She is s/p left arm brachial artery to basilic vein AVF on 7/93 with Dr. Donzetta Matters ?- HD per MWF schedule  ?- Note she will have a MWF schedule upon discharge at [facility] ? ?3. 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  Increased aranesp to 100 mcg weekly on mondays.  Status post intravenous iron. ? 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 PTH low at 74.   ? ?5. Hypertension: controlled on current regimen ? ?Dispo - spoke with HD SW - she is potentially for d/c on 5/18.  HD SW has alerted clinic to a potential start date of 5/19  ? ? ? ?Subjective:   ?Last HD on 5/12 with 1.9 liters UF.  There was no urine output charted over 5/13.  She is looking forward to getting home and we reviewed to not shower or immerse the tunneled dialysis catheter in water ? ?Review of systems:    ?Denies n/v ?Denies chest pain or shortness of breath  ? ?Objective:   ?BP 129/72 (BP Location: Right Arm)   Pulse 70   Temp 98.2 ?F (36.8 ?C)   Resp 18   Ht 5\' 7"  (1.702 m)   Wt 83.5 kg   LMP  (LMP Unknown)   SpO2 95%   BMI 28.83 kg/m?  ? ?Physical Exam:    ?General adult female in bed in no acute distress ?HEENT normocephalic atraumatic extraocular movements intact sclera anicteric ?Neck supple trachea midline ?Lungs clear to auscultation  bilaterally normal work of breathing at rest  ?Heart S1S2 no rub ?Abdomen soft nontender nondistended ?Extremities no edema; left foot in cast ?Psych normal mood and affect ?Access tunneled HD catheter in place; LUE AVF with bruit - swelling better; good hand grip; pulses intact; warm ? ?Labs: ?BMET ?Recent Labs  ?Lab 09/30/21 ?1231 10/02/21 ?9030 10/04/21 ?0923 10/06/21 ?3007  ?NA 132* 131* 129* 129*  ?K 4.0 3.8 4.0 4.3  ?CL 98 100 92* 94*  ?CO2 24 26 27 25   ?GLUCOSE 268* 147* 191* 179*  ?BUN 43* 26* 24* 25*  ?CREATININE 6.40* 4.66* 4.87* 4.74*  ?CALCIUM 8.3* 8.1* 8.6* 8.6*  ?PHOS 5.7* 4.2 4.2 3.9  ? ?CBC ?Recent Labs  ?Lab 09/30/21 ?1231 10/06/21 ?6226  ?WBC 8.4 10.5  ?HGB 9.1* 9.3*  ?HCT 28.7* 29.9*  ?MCV 95.0 95.2  ?PLT 192 218  ? ? ?  ?Medications:   ? ? (feeding supplement) PROSource Plus  30 mL Oral BID WC  ? atorvastatin  40 mg Oral Q supper  ? benztropine  1 mg Oral QPM  ? Chlorhexidine Gluconate Cloth  6 each Topical BID  ? Chlorhexidine Gluconate Cloth  6 each Topical Q0600  ? [START ON 10/07/2021] darbepoetin (ARANESP) injection - DIALYSIS  100 mcg Intravenous Q Mon-HD  ? docusate sodium  100 mg Oral Daily  ? enoxaparin (LOVENOX) injection  30 mg Subcutaneous Q24H  ? FLUoxetine  60 mg Oral q morning  ? gabapentin  100 mg Oral Daily  ? insulin aspart  0-5 Units Subcutaneous QHS  ? insulin aspart  0-6 Units Subcutaneous TID WC  ? lurasidone  40 mg Oral QHS  ? metoprolol succinate  50 mg Oral Daily  ? multivitamin  1 tablet Oral QHS  ? prazosin  1 mg Oral QHS  ? protein supplement  1 Scoop Oral TID WC  ? [START ON 10/08/2021] Semaglutide (1 MG/DOSE)  2 mg Subcutaneous Weekly  ? traZODone  300 mg Oral QHS  ? ?Claudia Desanctis, MD ?10/06/2021, 5:06 PM ? ? ? ? ? ? ? ?

## 2021-10-07 LAB — GLUCOSE, CAPILLARY
Glucose-Capillary: 205 mg/dL — ABNORMAL HIGH (ref 70–99)
Glucose-Capillary: 259 mg/dL — ABNORMAL HIGH (ref 70–99)
Glucose-Capillary: 132 mg/dL — ABNORMAL HIGH (ref 70–99)
Glucose-Capillary: 174 mg/dL — ABNORMAL HIGH (ref 70–99)

## 2021-10-07 MED ORDER — DARBEPOETIN ALFA 100 MCG/0.5ML IJ SOSY
PREFILLED_SYRINGE | INTRAMUSCULAR | Status: AC
  Filled 2021-10-07: qty 0.5

## 2021-10-07 NOTE — Progress Notes (Signed)
?                                                       PROGRESS NOTE ? ? ?Subjective/Complaints: ?No new complaints this morning ?Denies pain ?Voiding some on her own ?Excited for discharge home this week.  ? ?ROS: Patient denies fever, rash, sore throat, blurred vision, dizziness, diarrhea, nausea, cough chest pain, shortness of breath, joint back or neck pain headache or change in mood. Positive for constipation, relieved with sorbitol. ?Objective: ?  ?No results found. ? ?Recent Labs  ?  10/06/21 ?0611  ?WBC 10.5  ?HGB 9.3*  ?HCT 29.9*  ?PLT 218  ? ? ?Recent Labs  ?  10/06/21 ?0611  ?NA 129*  ?K 4.3  ?CL 94*  ?CO2 25  ?GLUCOSE 179*  ?BUN 25*  ?CREATININE 4.74*  ?CALCIUM 8.6*  ? ? ?Intake/Output Summary (Last 24 hours) at 10/07/2021 1522 ?Last data filed at 10/07/2021 7001 ?Gross per 24 hour  ?Intake 120 ml  ?Output --  ?Net 120 ml  ?  ? ?Pressure Injury 09/25/21 Sacrum Medial;Upper Stage 2 -  Partial thickness loss of dermis presenting as a shallow open injury with a red, pink wound bed without slough. 1 mm x 22mm (Active)  ?09/25/21 2249  ?Location: Sacrum  ?Location Orientation: Medial;Upper  ?Staging: Stage 2 -  Partial thickness loss of dermis presenting as a shallow open injury with a red, pink wound bed without slough.  ?Wound Description (Comments): 1 mm x 55mm (Staged by DARA, RN)  ?Present on Admission: Yes  ? ? ?Physical Exam: ?Vital Signs ?Blood pressure 105/67, pulse 76, temperature 98.7 ?F (37.1 ?C), temperature source Oral, resp. rate 14, height 5\' 7"  (1.702 m), weight 85.9 kg, SpO2 94 %. ?Constitutional: No distress . Vital signs reviewed. BMI 29.66 ?HEENT: NCAT, EOMI, oral membranes moist ?Neck: supple ?Cardiovascular: RRR without murmur. No JVD    ?Respiratory/Chest: CTA Bilaterally without wheezes or rales. Normal effort    ?GI/Abdomen: BS +, non-tender, non-distended, LBM 10/05/21 ?Ext: no clubbing, cyanosis, or edema ?Psych: flat but cooperative  ?Skin: right IJ TDC, left foot with vac, sacral  wound stage 2 ?Neuro/MSK: Alert. Left foot in short leg cast, nonweightbearing status. Impaired problem solving, delayed in general. Decreased sensation in left arm, resolved, likely from nerve block. ?GU: anuric, dialysis MWF ? ?Assessment/Plan: ?1. Functional deficits which require 3+ hours per day of interdisciplinary therapy in a comprehensive inpatient rehab setting. ?Physiatrist is providing close team supervision and 24 hour management of active medical problems listed below. ?Physiatrist and rehab team continue to assess barriers to discharge/monitor patient progress toward functional and medical goals ? ?Care Tool: ? ?Bathing ?   ?Body parts bathed by patient: Right arm, Left arm, Chest, Abdomen, Right upper leg, Left upper leg, Right lower leg, Face, Front perineal area  ? Body parts bathed by helper: Buttocks, Right lower leg ?Body parts n/a: Left lower leg (cast) ?  ?Bathing assist Assist Level: Supervision/Verbal cueing ?  ?  ?Upper Body Dressing/Undressing ?Upper body dressing   ?What is the patient wearing?: Pull over shirt ?   ?Upper body assist Assist Level: Supervision/Verbal cueing ?   ?Lower Body Dressing/Undressing ?Lower body dressing ? ? ? Lower body dressing activity did not occur: Safety/medical concerns (Wound vac temp disconnected by nursing staff) ?What is the  patient wearing?: Incontinence brief, Pants ? ?  ? ?Lower body assist Assist for lower body dressing: Moderate Assistance - Patient 50 - 74% ?   ? ?Toileting ?Toileting Toileting Activity did not occur (Probation officer and hygiene only): N/A (no void or bm)  ?Toileting assist Assist for toileting: Moderate Assistance - Patient 50 - 74% ?  ?  ?Transfers ?Chair/bed transfer ? ?Transfers assist ? Chair/bed transfer activity did not occur: Refused ? ?Chair/bed transfer assist level: Minimal Assistance - Patient > 75% ?  ?  ?Locomotion ?Ambulation ? ? ?Ambulation assist ? ? Ambulation activity did not occur: Refused ? ?  ?  ?    ? ?Walk 10 feet activity ? ? ?Assist ? Walk 10 feet activity did not occur: Refused ? ?  ?   ? ?Walk 50 feet activity ? ? ?Assist Walk 50 feet with 2 turns activity did not occur: Safety/medical concerns ? ?  ?   ? ? ?Walk 150 feet activity ? ? ?Assist Walk 150 feet activity did not occur: Safety/medical concerns ? ?  ?  ?  ? ?Walk 10 feet on uneven surface  ?activity ? ? ?Assist Walk 10 feet on uneven surfaces activity did not occur: Safety/medical concerns ? ? ?  ?   ? ?Wheelchair ? ? ? ? ?Assist Is the patient using a wheelchair?: Yes ?Type of Wheelchair: Manual ?Wheelchair activity did not occur: Refused ? ?Wheelchair assist level: Supervision/Verbal cueing ?Max wheelchair distance: 54ft  ? ? ?Wheelchair 50 feet with 2 turns activity ? ? ? ?Assist ? ?  ?Wheelchair 50 feet with 2 turns activity did not occur: Refused ? ? ?Assist Level: Supervision/Verbal cueing  ? ?Wheelchair 150 feet activity  ? ? ? ?Assist ? Wheelchair 150 feet activity did not occur: Refused ? ? ?   ? ?Blood pressure 105/67, pulse 76, temperature 98.7 ?F (37.1 ?C), temperature source Oral, resp. rate 14, height 5\' 7"  (1.702 m), weight 85.9 kg, SpO2 94 %. ? ? ? Medical Problem List and Plan: ?1. Functional deficits secondary to left trimalleolar ankle fracture ?            -patient may not shower until wound vac removal. ?            -ELOS/Goals: 14-16 days mod I ?           -Continue CIR therapies including PT, OT  ? -messaged SW to schedule caregiver training with son.  ? -XR reviewed and stable. Remove wound vac today and apply splint.  ?-5/13 Wound vac d/c, now in short leg cast ?2.  Impaired mobility, nonambulatory:  continue Lovenox.  ?3. Postoperative pain: N/A ?4. Bipolar disorder?: continue Latuda ?5. Neuropsych: This patient is capable of making decisions on her own behalf. ?6. Left lower extremity trimalleolar fracture: wound avc to be removed Wednesday 5/10 by ortho, discussed with nursing and patient ?-5/13 Wound vac d/c, now in  short leg cast ?5/14 doing well with short leg cast, discussed that leg is healing well.  ?7. Obesity: BMI 32.32 provide dietary education. Restart Semaglutide. 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 HTN: Monitor BP TID ?5/13 Prazosin added back per nephrology, 10/02/21, BP variable and can reahc 140-150's, monitor and trend ?5/14 SBP 130's-140's, CTM. ? ?  10/07/2021  ?  3:00 PM 10/07/2021  ?  2:30 PM 10/07/2021  ?  2:00 PM  ?Vitals with BMI  ?Systolic [phone]  ?Diastolic 67 67 62  ?Pulse 76  77 75  ?  ?11. T2DM: Hgb A1C- 5.7 (down from 6.6  a couple of  months ago)--was on glucotrol and ozempic PTA ?            --CBG (last 3)  ?Recent Labs  ?  10/06/21 ?2108 10/07/21 ?0610 10/07/21 ?1206  ?[place] ? Restart Semaglutide.--first dose 09/30/21 from home. Increase Semglutide to 2mg  ? -cover with ssi for now ? -change prosource to with meals.  ?5/13 Continue to monitor CBG's 199-223 ?5/14 Continue 2 mg Semglutide and SSI ?12. Bipolar disorder: On Latuda, Trazodone and Prozac.  ?--Parkinsonism/EPS  managed with benztropine.  ?-might benefit from neuro-psych eval ?13. Anemia of chronic disease: continue to monitor Hgb with HD labs.  ?14. Screening for vitamin D deficiency: Vitamin D, 25 hydroxyl-: Level high normal @ 93.97. Does not need supplementation ?15. Hyponatremia: normalized, continue to monitor Na with HD labs ?16. Incontinent of urine: bladder program ordered.  ?17. Poor po intake ? 5/7 -reached out nephro. [place] for reg CM diet ?18. Constipation: continue colace daily.  ?19. Obesity BMI 32.42: change prosource to with meals.  ?20. Insomnia: continue trazodone ?-5/13 sleeping well. ?21. Left arm numbness: discussed that nerve block should wear off today ?-5/13 Improving today, barely noticeable ?5/14 left arm numbness resolved. ?22. Nausea/vomiting ?5/13 Pt had a bad 24-48 hrs with nausea and vomiting, possible due to adjusting to dialysis and anesthesia from new  AV fistula.  Reports that this am still present bit better. ?5/14 No issues with nausea or vomiting today, resolved. ?23. Constipation ?-5/14 LBM  evening 10/05/21, relieved with 70 % sorbitol ? ?LOS: ?12 days

## 2021-10-07 NOTE — Progress Notes (Addendum)
Patient ID: Jessica Mercado, female   DOB: 03-10-1961, 61 y.o.   MRN: [id number]  [facility] referral sent to [facility]. Patient declined due to insurance -[facility] referral sent to [facility]. Patient declined due to insurance and wound -HH referral sent to [facility] due to insurance  -[facility] referral sent to [facility]. Patient declined due to new wound - HH referral sent to [facility]. Patient declined due to insurance.  [facility] referral sent to [facility] -[facility] referral sent to [facility]. Patient declined due to service area.  -HH referral sent to [facility]. Patient declined due to insurance

## 2021-10-07 NOTE — Progress Notes (Signed)
Occupational Therapy Session Note ? ?Patient Details  ?Name: Jessica Mercado ?MRN: [id number] ?Date of Birth: [birth year] ? ?Today's Date: 10/07/2021 ?OT Individual Time: 3335-4562 ?OT Individual Time Calculation (min): 70 min  ? ? ?Short Term Goals: ?Week 2:  OT Short Term Goal 1 (Week 2): STG = LTG 2/2 ELOS ? ?Skilled Therapeutic Interventions/Progress Updates:  ?Skilled OT intervention completed with focus on . Pt received upright in bed, with kidney MD in room assessing pt, no c/o pain, agreeable to session. Pt found to be slightly incontinent of urine in brief, unaware. For efficiency, toileting completed bed level with pt able to roll R<>L with supervision, with pt completing front pericare with set up A, and therapist donning brief with mod A. Transitioned supine with bed flat to sitting EOB with supervision, total A for board placement then slideboard transfer > L with CGA this session and consistent cues needed for NWB on LLE and minimal compliance except for when therapist blocked L heel with foot. ? ?Seated at sink 2/2 HD port, completed bathing exluding pericare, with supervision and cues needed to attend to all areas, with pt appearing to self-limit, per complaints of wanting therapist to "do this for me like my son-in-law will". Education provided about OT purpose and promoting independence with self-care, with discussion about continuing sponge bathing at sink vs pt report of "bed baths given by [name]" to promote functional endurance and maintain independence. Donned UB with supervision, and threading BLE into pants and R foot into sock with supervision using figure 4 position. Encouraged pt to laterally lean to get pants closer to hips, with pt able to do at supervision level, then min A needed for scooping over bottom with pt pushing up in w/c to clear bottom with supervision and therapist elevating L foot under heal to prevent WB. Education provided on this technique for use at home as well as vs  toileting method for pericare. Grooming completed with supervision. ? ?Seated in w/c, pt completed the following BUE exercises to promote strength needed for slideboard transfers and functional tasks: ?(With yellow theraband) ?Horizontal abduction 2x10  ?Self-anchored bicep flexion each arm x10 each arm ?Alternating chest presses  ?(Multimodal cues needed for form and technique throughout, with pt needing encouragement for full participation) ?Pt was left seated in w/c, with chair alarm on and all needs in reach at end of session. ? ? ? ?Therapy Documentation ?Precautions:  ?Precautions ?Precautions: Fall ?Precaution Comments: wound vac removed 5/12 and hard cast applied ?Required Braces or Orthoses: Splint/Cast ?Splint/Cast: hard fiberglass cast ?Splint/Cast - Date Prophylactic Dressing Applied (if applicable): 56/38/93 ?Restrictions ?Weight Bearing Restrictions: Yes ?RLE Weight Bearing: Weight bearing as tolerated ?LLE Weight Bearing: Non weight bearing ? ? ? ? ?Therapy/Group: Individual Therapy ? ?[place] ?10/07/2021, 7:41 AM ?

## 2021-10-07 NOTE — Progress Notes (Signed)
Physical Therapy Session Note ? ?Patient Details  ?Name: Jessica Mercado ?MRN: [id number] ?Date of Birth: [birth year] ? ?Today's Date: 10/07/2021 ?PT Individual Time: 1100-1200 [id number] ?PT Individual Time Calculation (min): 60 min and 53 min ? ?Short Term Goals: ?Week 1:  PT Short Term Goal 1 (Week 1): Patient will adhere to weight bearing precautions with all mobility at least 50% of the time ?PT Short Term Goal 2 (Week 1): Patient will transfer bed <> wc with LRAD and ModA ?PT Short Term Goal 3 (Week 1): Patient will complete sit <> stand with LRAD and CGA while maintaining wbng precautions ?PT Short Term Goal 4 (Week 1): Patient will initiate wc mob ?Week 2:    ?Week 3:    ?Week 4:    ? ?Skilled Therapeutic Interventions/Progress Updates:  ?  AM SESSION ?PAIN DENIES PAIN THIS AM ? ?Pt performedwith the following exercises with emphasis on LE strength/ROM: ?-hip abduction 2x10 bilaterally AAROM on LLE ?-SLR x10 bilaterall ?-hip adduction pillow squeezes 2x10 ?GLUT SQEEZES x 12 ?- Seated Long Arc Quad  - 1 x- 1 sets - 10 reps ? ?SUPINE scoot w/bed rails and cues to adhere to nwb LLE. ?supine to sit w/supervision and use of rail ?sliding board transfer w/total assist to position board, additional time and encouragement to perform transfer herself, supervision, use of folded floor mat under R foot to allow for boosing assist. ? ?WC PROPULSION 78ft w/cues to increase use of LUE due to veering L, additional time ?Wc pushups w/step under R foot 2 x 5 reps, pt c/o nausea and need to return to room. ? ?Transported to room. ?Wc to bed sliding board transfer w/total assist to place board, 3in step under R foot, pt transfers w/set up and supervision only w/level transfer.  Sit to supine w/cues only.  Declines further rx due to nausea.  7 min missed time due to nausea. ? ?PM SESSION ?PAIN denies pain ? ?Pt initially oob in wc. ?Transported to gym for session. ? ?Practiced sit to semistand/squat in parallel bars w/3in step under R  foot, therapist elevating L foot to prevent wbing.   ?Repeated sets of 3, maintains squat position x 5sec each rep., 3 sets.  Performed for functional strenthening. ? ?Completed blocked practice sliding board transfers on level surfaces from w/c to mat table. Placed 4inch platform under her RLE to assist with pushing. She was able to assist w/SB placement completing w/mod assist for securing adequately. Completed x6 total SB transfers and was compliant with NWB status with therapist holding her LLE elevated. Otherwise, pt was able to complete without physical assist going both L/R directions.  Requires extended periods of rest between efforts and encouragement to participate/maximize effort. ? ?Wc propulsion x 19ft w/verbal cues/encouragement to complete task, veers to L, cues to correct, slow propulsion, assit needed for backing. ?Pt left oob in wc w/alarm belt set and needs in reach ? ?Therapy Documentation ?Precautions:  ?Precautions ?Precautions: Fall ?Precaution Comments: wound vac removed 5/12 and hard cast applied ?Required Braces or Orthoses: Splint/Cast ?Splint/Cast: hard fiberglass cast ?Splint/Cast - Date Prophylactic Dressing Applied (if applicable): 06/03/30 ?Restrictions ?Weight Bearing Restrictions: Yes ?RLE Weight Bearing: Weight bearing as tolerated ?LLE Weight Bearing: Non weight bearing ? ? ?Therapy/Group: Individual Therapy ?Callie Fielding, PT ? ? ?Jessica Mercado ?10/07/2021, 12:04 PM  ?

## 2021-10-07 NOTE — Progress Notes (Signed)
Patient ID: Jessica Mercado, female   DOB: Jul 25, 1960, 61 y.o.   MRN: [id number] ? ?Bari Drop Arm and slide board ordered through [facility] ?

## 2021-10-07 NOTE — Progress Notes (Signed)
?[facility] ?Progress Note  ? ?Subjective:   Patient seen and examined at bedside.  Reports vomiting x 3 yesterday.  Denies current n/v.  Otherwise feeling ok.  Denies CP, abdominal pain, SOB and dizziness.  Tolerating dialysis well.  ? ?Objective ?Vitals:  ? 10/06/21 1438 10/06/21 2047 10/07/21 0517 10/07/21 0627  ?BP: 129/72 105/61 135/77   ?Pulse: 70 79 71   ?Resp: 18 14 14    ?Temp: 98.2 ?F (36.8 ?C) 98 ?F (36.7 ?C) 98.7 ?F (37.1 ?C)   ?TempSrc:  Oral    ?SpO2: 95% 96% 94%   ?Weight:    85.9 kg  ?Height:      ? ?Physical Exam ?General:chronically ill appearing female in NAD ?Heart:RRR, no mrg ?Lungs:CTAB, nml WOB on RA ?Abdomen:soft, NTND ?Extremities:no LE edema, hard cast on LLE ?Dialysis Access: TDC, LU AVF +b/t  ? ?Filed Weights  ? 10/04/21 1809 10/05/21 0436 10/07/21 0627  ?Weight: 84.5 kg 83.5 kg 85.9 kg  ? ? ?Intake/Output Summary (Last 24 hours) at 10/07/2021 1030 ?Last data filed at 10/07/2021 9390 ?Gross per 24 hour  ?Intake 240 ml  ?Output --  ?Net 240 ml  ? ? ?Additional Objective ?Labs: ?Basic Metabolic Panel: ?Recent Labs  ?Lab 10/02/21 ?3009 10/04/21 ?2330 10/06/21 ?0762  ?NA 131* 129* 129*  ?K 3.8 4.0 4.3  ?CL 100 92* 94*  ?CO2 26 27 25   ?GLUCOSE 147* 191* 179*  ?BUN 26* 24* 25*  ?CREATININE 4.66* 4.87* 4.74*  ?CALCIUM 8.1* 8.6* 8.6*  ?PHOS 4.2 4.2 3.9  ? ?Liver Function Tests: ?Recent Labs  ?Lab 10/02/21 ?2633 10/04/21 ?3545 10/06/21 ?6256  ?ALBUMIN 2.5* 2.8* 2.6*  ? ? ?CBC: ?Recent Labs  ?Lab 09/30/21 ?1231 10/06/21 ?3893  ?WBC 8.4 10.5  ?HGB 9.1* 9.3*  ?HCT 28.7* 29.9*  ?MCV 95.0 95.2  ?PLT 192 218  ? ?CBG: ?Recent Labs  ?Lab 10/06/21 ?7342 10/06/21 ?1155 10/06/21 ?1648 10/06/21 ?2108 10/07/21 ?8768  ?GLUCAP 186* 211* 177* 225* 205*  ? ? ?Medications: ? ferric gluconate (FERRLECIT) IVPB 125 mg (10/04/21 1658)  ? ? (feeding supplement) PROSource Plus  30 mL Oral BID WC  ? atorvastatin  40 mg Oral Q supper  ? benztropine  1 mg Oral QPM  ? Chlorhexidine Gluconate Cloth  6 each  Topical Q0600  ? darbepoetin (ARANESP) injection - DIALYSIS  100 mcg Intravenous Q Mon-HD  ? docusate sodium  100 mg Oral Daily  ? enoxaparin (LOVENOX) injection  30 mg Subcutaneous Q24H  ? FLUoxetine  60 mg Oral q morning  ? gabapentin  100 mg Oral Daily  ? insulin aspart  0-5 Units Subcutaneous QHS  ? insulin aspart  0-6 Units Subcutaneous TID WC  ? lurasidone  40 mg Oral QHS  ? metoprolol succinate  50 mg Oral Daily  ? multivitamin  1 tablet Oral QHS  ? prazosin  1 mg Oral QHS  ? protein supplement  1 Scoop Oral TID WC  ? [START ON 10/08/2021] Semaglutide (1 MG/DOSE)  2 mg Subcutaneous Weekly  ? traZODone  300 mg Oral QHS  ? ? ?Dialysis Orders: ?New start, establishing orders. To go to [facility] on d/c ? ?Assessment/Plan: ?Falls/syncope/left trimalleolar ankle fracture: The fall and syncope was likely related to worsening uremia. ORIF on L trimalleolar ankle Frx and pinning of dislocation on 4/25 by Dr. Marcelino Scot.  Wound vac d/c, with short leg cast placement on 5/13. Admitted to inpatient rehabilitation for continued strengthening to facilitate eventual discharge home. ?  ?2. ESRD: With  progressive CKD to ESRD, now dependent on hemodialysis.  She is s/p left arm brachial artery to basilic vein AVF on 3/34 with Dr. Donzetta Matters ?- HD per MWF schedule  ?- Note she will have a MWF schedule upon discharge at [facility] ?  ?3. Anemia CKD: Hgb 9.3. 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  Increased aranesp to 100 mcg weekly on mondays starting today. Last dose of iron course to be completed today.  ?  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 PTH low at 74.  Not on VDRA or binders.  ?  ?5. Hypertension: controlled on current regimen - Toprolol 50mg  QD ? ?6. Bipolar disorder :on latuda. Trazadone and prozac. ? ?7. DMT2 - on semglutide ?  ?6. Dispo - spoke with HD SW - she is potentially for d/c on 5/18.  HD SW has alerted clinic to a potential start date of 5/19  ? ?Jen Mow, PA-C ?[facility] ?10/07/2021,10:30 AM ? LOS: 12 days  ? ? ?

## 2021-10-08 LAB — GLUCOSE, CAPILLARY
Glucose-Capillary: 180 mg/dL — ABNORMAL HIGH (ref 70–99)
Glucose-Capillary: 210 mg/dL — ABNORMAL HIGH (ref 70–99)
Glucose-Capillary: 120 mg/dL — ABNORMAL HIGH (ref 70–99)
Glucose-Capillary: 126 mg/dL — ABNORMAL HIGH (ref 70–99)

## 2021-10-08 MED ORDER — ENSURE ENLIVE PO LIQD
237.0000 mL | Freq: Two times a day (BID) | ORAL | Status: DC
  Administered 2021-10-08 – 2021-10-09 (×2): 237 mL via ORAL

## 2021-10-08 NOTE — Progress Notes (Signed)
?                                                       PROGRESS NOTE ? ? ?Subjective/Complaints: ?Agreeable to PT today ?Patient's chart reviewed- No issues reported overnight ?Vitals signs stable  ? ? ?ROS: Patient denies fever, rash, sore throat, blurred vision, dizziness, diarrhea, nausea, cough chest pain, shortness of breath, joint back or neck pain headache or change in mood. Positive for constipation, relieved with sorbitol. ?Objective: ?  ?No results found. ? ?Recent Labs  ?  10/06/21 ?0611  ?WBC 10.5  ?HGB 9.3*  ?HCT 29.9*  ?PLT 218  ? ? ?Recent Labs  ?  10/06/21 ?0611  ?NA 129*  ?K 4.3  ?CL 94*  ?CO2 25  ?GLUCOSE 179*  ?BUN 25*  ?CREATININE 4.74*  ?CALCIUM 8.6*  ? ? ?Intake/Output Summary (Last 24 hours) at 10/08/2021 1127 ?Last data filed at 10/08/2021 0700 ?Gross per 24 hour  ?Intake 238 ml  ?Output 2000 ml  ?Net -1762 ml  ?  ? ?Pressure Injury 09/25/21 Sacrum Medial;Upper Stage 2 -  Partial thickness loss of dermis presenting as a shallow open injury with a red, pink wound bed without slough. 1 mm x 40mm (Active)  ?09/25/21 2249  ?Location: Sacrum  ?Location Orientation: Medial;Upper  ?Staging: Stage 2 -  Partial thickness loss of dermis presenting as a shallow open injury with a red, pink wound bed without slough.  ?Wound Description (Comments): 1 mm x 63mm (Staged by DARA, RN)  ?Present on Admission: Yes  ? ? ?Physical Exam: ?Vital Signs ?Blood pressure (!) 107/58, pulse 81, temperature 98.4 ?F (36.9 ?C), resp. rate 16, height 5\' 7"  (1.702 m), weight 83.5 kg, SpO2 93 %. ?Constitutional: No distress . Vital signs reviewed. BMI 29.66, Fatigue ?HEENT: NCAT, EOMI, oral membranes moist ?Neck: supple ?Cardiovascular: RRR without murmur. No JVD    ?Respiratory/Chest: CTA Bilaterally without wheezes or rales. Normal effort    ?GI/Abdomen: BS +, non-tender, non-distended, LBM 10/05/21 ?Ext: no clubbing, cyanosis, or edema ?Psych: flat but cooperative  ?Skin: right IJ TDC, left foot with vac, sacral wound stage  2 ?Neuro/MSK: Alert. Left foot in short leg cast, nonweightbearing status. Impaired problem solving, delayed in general. Decreased sensation in left arm, resolved, likely from nerve block. ?GU: anuric, dialysis MWF ? ?Assessment/Plan: ?1. Functional deficits which require 3+ hours per day of interdisciplinary therapy in a comprehensive inpatient rehab setting. ?Physiatrist is providing close team supervision and 24 hour management of active medical problems listed below. ?Physiatrist and rehab team continue to assess barriers to discharge/monitor patient progress toward functional and medical goals ? ?Care Tool: ? ?Bathing ?   ?Body parts bathed by patient: Right arm, Left arm, Chest, Abdomen, Right upper leg, Left upper leg, Right lower leg, Face, Front perineal area  ? Body parts bathed by helper: Buttocks, Right lower leg ?Body parts n/a: Left lower leg (cast) ?  ?Bathing assist Assist Level: Supervision/Verbal cueing ?  ?  ?Upper Body Dressing/Undressing ?Upper body dressing   ?What is the patient wearing?: Pull over shirt ?   ?Upper body assist Assist Level: Supervision/Verbal cueing ?   ?Lower Body Dressing/Undressing ?Lower body dressing ? ? ? Lower body dressing activity did not occur: Safety/medical concerns (Wound vac temp disconnected by nursing staff) ?What is the patient wearing?: Incontinence  brief, Pants ? ?  ? ?Lower body assist Assist for lower body dressing: Moderate Assistance - Patient 50 - 74% ?   ? ?Toileting ?Toileting Toileting Activity did not occur (Probation officer and hygiene only): N/A (no void or bm)  ?Toileting assist Assist for toileting: Moderate Assistance - Patient 50 - 74% ?  ?  ?Transfers ?Chair/bed transfer ? ?Transfers assist ? Chair/bed transfer activity did not occur: Refused ? ?Chair/bed transfer assist level: Minimal Assistance - Patient > 75% ?  ?  ?Locomotion ?Ambulation ? ? ?Ambulation assist ? ? Ambulation activity did not occur: Refused ? ?  ?  ?   ? ?Walk 10 feet  activity ? ? ?Assist ? Walk 10 feet activity did not occur: Refused ? ?  ?   ? ?Walk 50 feet activity ? ? ?Assist Walk 50 feet with 2 turns activity did not occur: Safety/medical concerns ? ?  ?   ? ? ?Walk 150 feet activity ? ? ?Assist Walk 150 feet activity did not occur: Safety/medical concerns ? ?  ?  ?  ? ?Walk 10 feet on uneven surface  ?activity ? ? ?Assist Walk 10 feet on uneven surfaces activity did not occur: Safety/medical concerns ? ? ?  ?   ? ?Wheelchair ? ? ? ? ?Assist Is the patient using a wheelchair?: Yes ?Type of Wheelchair: Manual ?Wheelchair activity did not occur: Refused ? ?Wheelchair assist level: Supervision/Verbal cueing ?Max wheelchair distance: 69ft  ? ? ?Wheelchair 50 feet with 2 turns activity ? ? ? ?Assist ? ?  ?Wheelchair 50 feet with 2 turns activity did not occur: Refused ? ? ?Assist Level: Supervision/Verbal cueing  ? ?Wheelchair 150 feet activity  ? ? ? ?Assist ? Wheelchair 150 feet activity did not occur: Refused ? ? ?   ? ?Blood pressure (!) 107/58, pulse 81, temperature 98.4 ?F (36.9 ?C), resp. rate 16, height 5\' 7"  (1.702 m), weight 83.5 kg, SpO2 93 %. ? ? ? Medical Problem List and Plan: ?1. Functional deficits secondary to left trimalleolar ankle fracture ?            -patient may not shower until wound vac removal. ?            -ELOS/Goals: 14-16 days mod I ?           -Continue CIR therapies including PT, OT  ? -messaged SW to schedule caregiver training with son.  ? -XR reviewed and stable. Remove wound vac today and apply splint.  ?-5/13 Wound vac d/c, now in short leg cast ?2.  Impaired mobility, nonambulatory:  continue Lovenox.  ?3. Postoperative pain: N/A ?4. Bipolar disorder?: continue Latuda ?5. Neuropsych: This patient is capable of making decisions on her own behalf. ?6. Left lower extremity trimalleolar fracture: wound avc to be removed Wednesday 5/10 by ortho, discussed with nursing and patient ?-5/13 Wound vac d/c, now in short leg cast ?5/14 doing well with  short leg cast, discussed that leg is healing well.  ?7. Obesity: BMI 32.32 provide dietary education.Continue Semaglutide. Discussed with patient, Increased dose.  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 HTN: Monitor BP TID ?5/13 Prazosin added back per nephrology, 10/02/21, BP variable and can reahc 140-150's, monitor and trend ?5/14 SBP 130's-140's, CTM. ? ?  10/08/2021  ?  5:00 AM 10/08/2021  ?  4:57 AM 10/07/2021  ?  7:39 PM  ?Vitals with BMI  ?Weight 184 lbs 1 oz    ?BMI 28.82    ?Systolic  107 127  ?Diastolic  58 71  ?Pulse  81 84  ?  ?11. T2DM: Hgb A1C- 5.7 (down from 6.6  a couple of  months ago)--was on glucotrol and ozempic PTA ?            --CBG (last 3)  ?Recent Labs  ?  10/07/21 ?2113 10/08/21 ?6734 10/08/21 ?1107  ?GLUCAP 174* 180* 210*  ?   -  ssi ? continueSemaglutide.--first dose 09/30/21 from home. Increase Semglutide to 2mg  ? -cover with ssi for now ? -change prosource to with meals.  ?5/13 Continue to monitor CBG's 199-223 ?5/14 Continue 2 mg Semglutide and SSI ?12. Bipolar disorder: On Latuda, Trazodone and Prozac.  ?--Parkinsonism/EPS  managed with benztropine.  ?-might benefit from neuro-psych eval ?13. Anemia of chronic disease: continue to monitor Hgb with HD labs.  ?14. Screening for vitamin D deficiency: Vitamin D, 25 hydroxyl-: Level high normal @ 93.97. Does not need supplementation ?15. Hyponatremia: normalized, continue to monitor Na with HD labs ?16. Incontinent of urine: bladder program ordered.  ?17. Poor po intake ? 5/7 -reached out nephro. [place] for reg CM diet ?18. Constipation: continue colace daily.  ?19. Obesity BMI 32.42: change prosource to with meals.  ?20. Insomnia: continue trazodone ?-5/13 sleeping well. ?21. Left arm numbness: discussed that nerve block should wear off today ?-5/13 Improving today, barely noticeable ?5/14 left arm numbness resolved. ?22. Nausea/vomiting ?5/13 Pt had a bad 24-48 hrs with nausea and vomiting, possible due to adjusting to dialysis and  anesthesia from new AV fistula.  Reports that this am still present bit better. ?5/14 No issues with nausea or vomiting today, resolved. ?23. Constipation ?-5/14 LBM  evening 10/05/21, relieved with 70 % sorbitol ?

## 2021-10-08 NOTE — Progress Notes (Signed)
Occupational Therapy Discharge Summary ? ?Patient Details  ?Name: Jessica Mercado ?MRN: [id number] ?Date of Birth: 11-13-1960 ? ? ?Patient has met 3 of 5 long term goals due to improved activity tolerance, postural control, and improved coordination.  Patient to discharge at [facility] Assist level.  Patient's care partner has physical limitations secondary to back pain/being on disability, and was encouraged by social work as well as by therapists to attend in person for hands on training to ensure that he could assist pt at her CLOF. CSW and therapist, on many accounts, attempted to call to educate pt's son in law on pt's assist level, how to assist with transfers and how to prepare pt and the caregiver for a safe d/c home, however unable to get pt's son in law to comply to coming in for hands on training. Did provide education over the phone on one account, about slideboard transfers, how to set them up, this therapist's recommendations for maximizing pt's self-care and prohibiting standing at this time secondary to NWB status on LLE with son in law expressing verbal understanding and expression that he feels confident assisting pt at her current level of function "without a concern about caring for her when she gets out." Therefore pt's son in law is deemed able to provide the necessary physical and cognitive assistance at discharge.   ? ?Reasons goals not met: several goals not met mutually all 2/2 self-limiting behaviors as well as poor carryover that effected maximum ADL completion at the supervision level vs at least min A for seated level tasks only ? ?Recommendation:  ?Patient will benefit from ongoing skilled OT services in home health setting to continue to advance functional skills in the area of BADL and Reduce care partner burden. ? ?Equipment: ?Drop arm bariatric BSC ? ?Reasons for discharge: treatment goals met and discharge from hospital ? ?Patient/family agrees with progress made and goals achieved:  Yes ? ?OT Discharge ?Precautions/Restrictions  ?Precautions ?Precautions: Fall ?Precaution Comments: wound vac removed 5/12 and hard cast applied ?Required Braces or Orthoses: Splint/Cast ?Splint/Cast: hard fiberglass cast ?Splint/Cast - Date Prophylactic Dressing Applied (if applicable): 10/93/23 ?Restrictions ?Weight Bearing Restrictions: Yes ?RLE Weight Bearing: Weight bearing as tolerated ?LLE Weight Bearing: Non weight bearing ?ADL ?ADL ?Equipment Provided: Long-handled shoe horn, Long-handled sponge ?Eating: Modified independent ?Where Assessed-Eating: Wheelchair ?Grooming: Modified independent ?Where Assessed-Grooming: Sitting at sink ?Upper Body Bathing: Setup ?Where Assessed-Upper Body Bathing: Sitting at sink ?Lower Body Bathing: Supervision/safety ?Where Assessed-Lower Body Bathing: Sitting at sink ?Upper Body Dressing: Setup ?Where Assessed-Upper Body Dressing: Sitting at sink ?Lower Body Dressing: Minimal assistance ?Where Assessed-Lower Body Dressing: Wheelchair ?Toileting: Moderate assistance ?Where Assessed-Toileting: Bedside Commode ?Toilet Transfer: Contact guard ?Toilet Transfer Method: Transfer board ?Science writer: Extra wide drop arm bedside commode ?Tub/Shower Transfer: Unable to assess ?Tub/Shower Transfer Method: Unable to assess ?Walk-In Shower Transfer: Unable to assess ?Walk-In Shower Transfer Method: Unable to assess ?Vision ?Baseline Vision/History: 1 Wears glasses ?Patient Visual Report: No change from baseline ?Vision Assessment?: No apparent visual deficits ?Perception  ?Perception: Within Functional Limits ?Praxis ?Praxis: Intact ?Cognition ?Cognition ?Overall Cognitive Status: History of cognitive impairments - at baseline ?Arousal/Alertness: Awake/alert ?Orientation Level: Person;Place;Situation ?Person: Oriented ?Place: Oriented ?Situation: Oriented ?Memory: Appears intact ?Awareness: Impaired ?Problem Solving: Impaired ?Safety/Judgment: Impaired ?Comments: Can state  NWB status for LLE but unable to carry over with functional mobility ?Brief Interview for Mental Status (BIMS) ?Repetition of Three Words (First Attempt): 1 ?Temporal Orientation: Year: Correct ?Temporal Orientation: Month: Accurate within 5 days ?Temporal  Orientation: Day: Incorrect ?Recall: "Sock": Yes, no cue required ?Recall: "Blue": Yes, no cue required ?Recall: "Bed": No, could not recall ?BIMS Summary Score: 10 ?Sensation ?Sensation ?Light Touch: Appears Intact ?Hot/Cold: Appears Intact ?Proprioception: Appears Intact ?Stereognosis: Appears Intact ?Motor  ?Motor ?Motor: Within Functional Limits ?Motor - Skilled Clinical Observations: generalized weakness/deconditioning and decreased balance strategies due to LLE NWB precautions ?Mobility  ?Bed Mobility ?Bed Mobility: Rolling Right;Rolling Left;Sit to Supine;Supine to Sit ?Rolling Right: Supervision/verbal cueing ?Rolling Left: Supervision/Verbal cueing ?Supine to Sit: Supervision/Verbal cueing ?Sit to Supine: Supervision/Verbal cueing  ?Trunk/Postural Assessment  ?Cervical Assessment ?Cervical Assessment: Within Functional Limits ?Thoracic Assessment ?Thoracic Assessment: Within Functional Limits ?Lumbar Assessment ?Lumbar Assessment: Exceptions to [facility] (posterior pelvic tilt in sitting) ?Postural Control ?Postural Control: Within Functional Limits  ?Balance ?Balance ?Balance Assessed: Yes ?Static Sitting Balance ?Static Sitting - Balance Support: No upper extremity supported;Feet supported ?Static Sitting - Level of Assistance: 6: Modified independent (Device/Increase time) ?Dynamic Sitting Balance ?Dynamic Sitting - Balance Support: No upper extremity supported;Feet supported;During functional activity ?Dynamic Sitting - Level of Assistance: 6: Modified independent (Device/Increase time) ?Extremity/Trunk Assessment ?RUE Assessment ?RUE Assessment: Within Functional Limits ?LUE Assessment ?LUE Assessment: Within Functional Limits ? ? ?[place] ?10/08/2021, 3:56 PM ?

## 2021-10-08 NOTE — Progress Notes (Signed)
Physical Therapy Session Note ? ?Patient Details  ?Name: Jessica Mercado ?MRN: [id number] ?Date of Birth: [birth year] ? ?Today's Date: 10/08/2021 ?PT Individual Time: 1000-1039 ?PT Individual Time Calculation (min): 39 min  ? ?Short Term Goals: ?Week 1:  PT Short Term Goal 1 (Week 1): Patient will adhere to weight bearing precautions with all mobility at least 50% of the time ?PT Short Term Goal 2 (Week 1): Patient will transfer bed <> wc with LRAD and ModA ?PT Short Term Goal 3 (Week 1): Patient will complete sit <> stand with LRAD and CGA while maintaining wbng precautions ?PT Short Term Goal 4 (Week 1): Patient will initiate wc mob ? ?Skilled Therapeutic Interventions/Progress Updates:  ? Received pt sitting in WC, pt agreeable to PT treatment, and denied any pain during session but reported fatigue from previous PT. Session with emphasis on functional mobility, WC mobility, and generalized strengthening and endurance. Pt performed WC mobility 20ft x 1 and 62ft x 1 using BUE and supervision with increased time. Pt transported remainder of way to/from dayroom dependently due to fatigue. Pt performed the following exercises with emphasis on LE/UE strength/ROM: ?-bicep curls with 2.5lb dowel 2x12 ?-overhead chest press with 2lb dowel 2x10 ?-horizontal chest press with 2lb dowel 2x10 ?-LAQ 2x10 bilaterally ?-hip flexion 2x10 bilaterally ?Pt moaning and complaining while exercising but stated she understands why it's important for her to do. Pt reports her son's friend will pick her up on Thursday and she has a WC at home already. Pt also reported new wound on buttocks (RN aware) - educated pt on lateral leans in [place] for pressure relief, however pt would benefit from continued education. Concluded session with pt sitting in WC, needs within reach, and chair pad alarm on. ? ?Therapy Documentation ?Precautions:  ?Precautions ?Precautions: Fall ?Precaution Comments: wound vac removed 5/12 and hard cast applied ?Required Braces  or Orthoses: Splint/Cast ?Splint/Cast: hard fiberglass cast ?Splint/Cast - Date Prophylactic Dressing Applied (if applicable): 01/65/53 ?Restrictions ?Weight Bearing Restrictions: Yes ?RLE Weight Bearing: Weight bearing as tolerated ?LLE Weight Bearing: Non weight bearing ? ? ?Therapy/Group: Individual Therapy ?Blenda Nicely ?Becky Sax PT, DPT  ?10/08/2021, 7:13 AM  ?

## 2021-10-08 NOTE — Progress Notes (Signed)
Occupational Therapy Session Note ? ?Patient Details  ?Name: Jessica Mercado ?MRN: [id number] ?Date of Birth: Sep 09, 1960 ? ?Today's Date: 10/08/2021 ?OT Individual Time: 9373-4287 & 6811-5726 ?OT Individual Time Calculation (min): 57 min & 57 min ? ? ?Short Term Goals: ?Week 2:  OT Short Term Goal 1 (Week 2): STG = LTG 2/2 ELOS ? ?Skilled Therapeutic Interventions/Progress Updates: ?Session 1 ?Skilled OT intervention completed with focus on assessing/promoting increased processing speed, w/c management, lateral leans, BUE endurance/strength. Pt received seated in w/c, no c/o pain, agreeable to session. Transported with total A in w/c <> gym for energy conservation. Initially asked pt if she knew how to remove leg rests, with pt responding "yes I know how to do that, I'm not an idiot" however when attempting to remove, pt needed mod cues and tactile assist for locating and sequencing how to remove. Education provided about w/c management, with improved ability to remove leg rest on R side with verbal cues only. Seated at [facility], pt completed speed dot assessment, with 7 sec reaction time on 1st trial, and 4.67 sec on 2nd trial with pt requiring an unreasonable amount of time. Pt continues to demonstrate self-limiting behaviors, with mod to max encouragement needed to maximize performance. Transitioned to copying on geoboard at [facility], with pt demonstrating min errors and difficulty with spatial orientation however improved ability with 100% accuracy on 2nd trial with more complex design. Pt laterally leaning during BITS to promote anterior/lateral weight shifts needed for toileting and LB dressing. Seated at [facility], completed the following intervals to promote cardiovascular and BUE endurance needed for function tasks/transfers: ? ?Level 3, forward propulsion, 3 mins x2 ?Level 3, backward propulsion, 2 min ?Required max encouragement as pt would state "this is mistreatment, [name] said to just punch the therapists when they're  hurting me." When therapist asked pt if she was in pain or what caused her to think therapist was mistreating her, pt stopped talking to therapist.  ? ?Transitioned to having pt donn BLE leg rests, with min to mod A needed for sequencing. Completed bicep curls and chest presses 3x10 each with 4 pound dowel to promote strength. Pt was left seated in w/c, with chair alarm on and all needs in reach at end of session. ? ?Session 2 ?Skilled OT intervention completed with focus on w/c mobility within a community setting/functional context, BUE strengthening, toileting. Pt received seated in w/c, agreeable to session, no c/o pain. Transported in w/c with total A from room <> gift shop to outside area. To promote cognitive strategies including memory, and to promote w/c propulsion, pt as able to self-propel in w/c with supervision about a total of 40 ft within gift shop to identify/retrieve 3 specified items. Min cues/guidance needed for locating and recalling specific items as pt was only able to recall 2/3 items during task. Min cues needed for navigating w/c as pt had 1 episode of bumping into furniture on L side. Transported pt with total A in w/c outside, for improved mood/arousal in the sunshine, with pt completing self-anchored bicep flexion on each arm, scapular retraction and shoulder abduction with yellow theraband 2x10 each. Back in room, pt was able to doff R leg rest with supervision/cues, then slideboard transfer > R with supervision this session whilst therapist's foot was under pt's R foot. Bed mobility with supervision. Maximized pt's participation in toileting ADLs by stepping away while pt was able to roll R<>L with supervision to doff pants/brief. Completed pericare with set up A for front,  then mod A donning of brief via rolling with supervision. Repositioned pt for comfort with use of bed features. Completed BLE AROM for edema management including 2x10 on each leg for the following: ?Heel slides, bent  leg raises, RLE ankle pumps. ?Pt was left upright in bed, set up on her cell phone per request to call her son-in-law, bed alarm on and all needs in reach at end of session. ?  ? ?Therapy Documentation ?Precautions:  ?Precautions ?Precautions: Fall ?Precaution Comments: wound vac removed 5/12 and hard cast applied ?Required Braces or Orthoses: Splint/Cast ?Splint/Cast: hard fiberglass cast ?Splint/Cast - Date Prophylactic Dressing Applied (if applicable): 21/03/12 ?Restrictions ?Weight Bearing Restrictions: Yes ?RLE Weight Bearing: Weight bearing as tolerated ?LLE Weight Bearing: Non weight bearing ? ? ? ?Therapy/Group: Individual Therapy ? ?[place] ?10/08/2021, 7:31 AM ?

## 2021-10-08 NOTE — Progress Notes (Signed)
Physical Therapy Session Note ? ?Patient Details  ?Name: Jessica Mercado ?MRN: [id number] ?Date of Birth: [birth year] ? ?Today's Date: 10/08/2021 ?PT Individual Time: 2409-7353 ?PT Individual Time Calculation (min): 56 min  ? ?Short Term Goals: ?Week 1:  PT Short Term Goal 1 (Week 1): Patient will adhere to weight bearing precautions with all mobility at least 50% of the time ?PT Short Term Goal 2 (Week 1): Patient will transfer bed <> wc with LRAD and ModA ?PT Short Term Goal 3 (Week 1): Patient will complete sit <> stand with LRAD and CGA while maintaining wbng precautions ?PT Short Term Goal 4 (Week 1): Patient will initiate wc mob ? ?Skilled Therapeutic Interventions/Progress Updates:  ?  Pt received supine in bed and agreeable to therapy session. Supine>sitting R EOB, HOB slightly elevated and bedrail available as pt reports she will be receiving a hospital bed with supervision. Sitting EOB, donned pants with min assist to thread over L LE - pt with poor problem solving inquiring how to get her pants on since she cannot stand while maintaining L LE NWBing - requires cuing to perform lateral leans vs return to supine to pull them up over hips. Pt requires significant encouragement to perform mobility tasks without assistance throughout session as she demonstrates minimal effort prior to requesting assistance - frequently pt will state "I can't" after one attempt at the task requiring encouragement and increased cuing to continue attempting the task with increased independence.  ? ?L lateral scoot transfer EOB>w/c using transfer board - requires visual demonstration and max cuing to lean R onto forearm and hike L hip in order to place board with min assist to set-up correct alignment of the board then, with max encouragement she is able to complete board placement without hands-on assistance - performed scooting with close supervision and therapist guarding to maintain L LE NWBing, demos good compliance. ? ?Therapist  reinforced education on w/c part management providing hand-over-hand assistance to place armrest and leg rests back on the chair. Pt reports she has been "donated" a wheelchair for use at D/C and is unable to provide additional details about the DME. ? ?B UE w/c propulsion ~71ft towards therapy gym with supervision until pt reports fatigue therefore transported the remainder of the distance to ortho gym for time management.  ? ?Simulated lateral scoot car transfer (sedan height - pt reports her friend is picking her up in [place] upon D/C) using transfer board with min assist for board placement and then close supervision/CGA for safety when scooting - therapist reinforces throughout the transfer on safe board placement and to ensure L LE NWBing with only 1x pt possibly pushing through L LE when scooting out of the car. Discussed that a real car transfer would have a wider gab between wheelchair seat and car seat. ? ?B UE w/c propulsion ~74ft + ~40ft with supervision working on navigating through apartment and turning around in tight spaces - with increased time pt able to problem solve how to manage w/c in smaller environment. ? ?Transported back to room in w/c.  Pt left seated in w/c with needs in reach and chair alarm on.  ? ?Therapy Documentation ?Precautions:  ?Precautions ?Precautions: Fall ?Precaution Comments: wound vac removed 5/12 and hard cast applied ?Required Braces or Orthoses: Splint/Cast ?Splint/Cast: hard fiberglass cast ?Splint/Cast - Date Prophylactic Dressing Applied (if applicable): 29/92/42 ?Restrictions ?Weight Bearing Restrictions: Yes ?RLE Weight Bearing: Weight bearing as tolerated ?LLE Weight Bearing: Non weight bearing ? ? ?Pain: ?  No complaints of pain throughout session. ? ? ?Therapy/Group: Individual Therapy ? ?Tawana Scale , PT, DPT, NCS, CSRS ?10/08/2021, 7:55 AM  ?

## 2021-10-08 NOTE — Progress Notes (Signed)
Nutrition Follow-up ? ?DOCUMENTATION CODES:  ? ?Obesity unspecified ? ?INTERVENTION:  ? ?Beneprotein 1 scoop TID, each supplement provides 25 kcals and 6 grams of protein ?Discontinue ProSource Plus  ?Ensure Enlive po BID, each supplement provides 350 kcal and 20 grams of protein. ?Renal MVI with minerals daily ? ?NUTRITION DIAGNOSIS:  ? ?Increased nutrient needs related to catabolic illness as evidenced by estimated needs. - Ongoing ? ?GOAL:  ? ?Patient will meet greater than or equal to 90% of their needs - Progressing  ? ?MONITOR:  ? ?PO intake, Supplement acceptance, Labs, I & O's ? ?REASON FOR ASSESSMENT:  ? ?Consult ?Diet education ? ?ASSESSMENT:  ? ?Pt admitted to CIR from hospital d/t fuctional declines d/t fall with L ankle dislocation s/p ORIF. During admission, pt progressed to ESRD with falls d/t uremia and subsequently started on HD. PMH significant for T2DM with retinopathy, bipolar disorder, CHF, HTN with nephropathy, R ankle fracture and 2-3 months of recurrent falls. ? ?5/11 - AV fistula placed ?5/12 - Wound VAC removed ? ?Pt sitting up in wheel chair at time of RD visit. Pt reports that her appetite is great and that she eats everything that is sent up to her. Pt reports that dialysis is going well. Denies any nausea or vomiting.  ?RD observed pt lunch tray 100% complete.  ?Per EMR, pt PO intake includes: ?5/13: Breakfast 10%, Lunch 75%, Dinner 30% ?5/14: Breakfast 30%, Lunch 100% ?5/15: Breakfast 50%, Lunch 75% ?5/16: Breakfast 20% ? ?Discussed ONS with pt, pt reports that she does not like the taste of ProSource. States that she has never tried Ensure, pt willing to try. Pt appropriate for Ensure due to increased nutrient needs related to HD and wound healing. ? ?Medications reviewed and include: Colace, SSI 0-5 units daily + 0-6 TID, Rena-Vit, Beneprotein, IV ferric gluconate ?Labs reviewed: Sodium 129, 24 CBG 132-259 ? ?HD on 5/15 ?Net UF: 2000 mL ?Post-Dialysis Weight: 84 kg ? ?Diet Order:    ?Diet Order   ? ?       ?  Diet Carb Modified Fluid consistency: Thin; Room service appropriate? Yes; Fluid restriction: 1200 mL Fluid  Diet effective now       ?  ? ?  ?  ? ?  ? ?EDUCATION NEEDS:  ? ?No education needs have been identified at this time ? ?Skin:  Skin Assessment: Skin Integrity Issues: ?Skin Integrity Issues:: Stage II, Incisions ?Stage II: sacrum ?Incisions: R leg, R neck ? ?Last BM:  5/14 ? ?Height:  ?Ht Readings from Last 1 Encounters:  ?09/25/21 5\' 7"  (1.702 m)  ? ?Weight:  ?Wt Readings from Last 1 Encounters:  ?10/08/21 83.5 kg  ? ?Ideal Body Weight:  61.4 kg ? ?BMI:  Body mass index is 28.83 kg/m?. ? ?Estimated Nutritional Needs:  ?Kcal:  1900-2100 ?Protein:  95-110 grams ?Fluid:  UOP + 1L ? ? ?Hermina Barters RD, LDN ?Clinical Dietitian ?See AMiON for contact information.  ?

## 2021-10-08 NOTE — Progress Notes (Signed)
?[facility] ?Progress Note  ? ?Subjective:   Patient seen and examined in room.  Sitting in wheelchair. No specific complaints.  Tolerated dialysis yesterday.  ? ?Objective ?Vitals:  ? 10/07/21 1859 10/07/21 1939 10/08/21 0457 10/08/21 0500  ?BP: (!) 142/88 127/71 (!) 107/58   ?Pulse: 83 84 81   ?Resp: 17 16 16    ?Temp: 98.7 ?F (37.1 ?C) 98.2 ?F (36.8 ?C) 98.4 ?F (36.9 ?C)   ?TempSrc:  Oral    ?SpO2: 100% 98% 93%   ?Weight:    83.5 kg  ?Height:      ? ?Physical Exam ?General:chronically ill appearing female in NAD ?Heart:RRR, no mrg ?Lungs:CTAB, nml WOB on RA ?Abdomen:soft, NTND ?Extremities:no LE edema, hard case on LLE ?Dialysis Access: TDC, LU AVF +b/t  ? ?Filed Weights  ? 10/07/21 0627 10/07/21 1720 10/08/21 0500  ?Weight: 85.9 kg 84 kg 83.5 kg  ? ? ?Intake/Output Summary (Last 24 hours) at 10/08/2021 1229 ?Last data filed at 10/08/2021 0700 ?Gross per 24 hour  ?Intake 238 ml  ?Output 2000 ml  ?Net -1762 ml  ? ? ?Additional Objective ?Labs: ?Basic Metabolic Panel: ?Recent Labs  ?Lab 10/02/21 ?6301 10/04/21 ?6010 10/06/21 ?9323  ?NA 131* 129* 129*  ?K 3.8 4.0 4.3  ?CL 100 92* 94*  ?CO2 26 27 25   ?GLUCOSE 147* 191* 179*  ?BUN 26* 24* 25*  ?CREATININE 4.66* 4.87* 4.74*  ?CALCIUM 8.1* 8.6* 8.6*  ?PHOS 4.2 4.2 3.9  ? ?Liver Function Tests: ?Recent Labs  ?Lab 10/02/21 ?5573 10/04/21 ?2202 10/06/21 ?5427  ?ALBUMIN 2.5* 2.8* 2.6*  ? ?CBC: ?Recent Labs  ?Lab 10/06/21 ?0611  ?WBC 10.5  ?HGB 9.3*  ?HCT 29.9*  ?MCV 95.2  ?PLT 218  ? ?CBG: ?Recent Labs  ?Lab 10/07/21 ?1206 10/07/21 ?1849 10/07/21 ?2113 10/08/21 ?0623 10/08/21 ?1107  ?GLUCAP 259* 132* 174* 180* 210*  ? ?Medications: ? ? (feeding supplement) PROSource Plus  30 mL Oral BID WC  ? atorvastatin  40 mg Oral Q supper  ? benztropine  1 mg Oral QPM  ? Chlorhexidine Gluconate Cloth  6 each Topical Q0600  ? darbepoetin (ARANESP) injection - DIALYSIS  100 mcg Intravenous Q Mon-HD  ? docusate sodium  100 mg Oral Daily  ? enoxaparin (LOVENOX) injection  30  mg Subcutaneous Q24H  ? FLUoxetine  60 mg Oral q morning  ? gabapentin  100 mg Oral Daily  ? insulin aspart  0-5 Units Subcutaneous QHS  ? insulin aspart  0-6 Units Subcutaneous TID WC  ? lurasidone  40 mg Oral QHS  ? metoprolol succinate  50 mg Oral Daily  ? multivitamin  1 tablet Oral QHS  ? prazosin  1 mg Oral QHS  ? protein supplement  1 Scoop Oral TID WC  ? Semaglutide (1 MG/DOSE)  2 mg Subcutaneous Weekly  ? traZODone  300 mg Oral QHS  ? ? ?Dialysis Orders: ?New start, establishing orders. To go to [facility] on d/c ?  ?Assessment/Plan: ?Falls/syncope/left trimalleolar ankle fracture: The fall and syncope was likely related to worsening uremia. ORIF on L trimalleolar ankle Frx and pinning of dislocation on 4/25 by Dr. Marcelino Scot.  Wound vac d/c, with short leg cast placement on 5/13. Admitted to inpatient rehabilitation for continued strengthening to facilitate eventual discharge home. ?  ?2. ESRD: With progressive CKD to ESRD, now dependent on hemodialysis.  She is s/p left arm brachial artery to basilic vein AVF on 7/62 with Dr. Donzetta Matters ?- EDW likely around 51 - 84kg ?-  HD per MWF schedule  ?- Note she will have a MWF schedule upon discharge at [facility] ?  ?3. Anemia CKD: Hgb 9.3. 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  Increased aranesp to 100 mcg weekly on mondays starting today. Last dose of iron course to be completed today.  ?  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 PTH low at 74.  Not on VDRA or binders.  ?  ?5. Hypertension: controlled on current regimen - Toprolol 50mg  QD ?  ?6. Bipolar disorder :on latuda. Trazadone and prozac. ?  ?7. DMT2 - on semglutide ?  ?6. Dispo - spoke with HD SW - she is potentially for d/c on 5/18.  HD SW has alerted clinic to a potential start date of 5/19  ? ?Jen Mow, PA-C ?[facility] ?10/08/2021,12:29 PM ? LOS: 13 days  ? ? ?

## 2021-10-09 ENCOUNTER — Other Ambulatory Visit (HOSPITAL_COMMUNITY): Payer: Self-pay

## 2021-10-09 LAB — CBC
HCT: 32 % — ABNORMAL LOW (ref 36.0–46.0)
Hemoglobin: 10.2 g/dL — ABNORMAL LOW (ref 12.0–15.0)
MCH: 30.1 pg (ref 26.0–34.0)
MCHC: 31.9 g/dL (ref 30.0–36.0)
MCV: 94.4 fL (ref 80.0–100.0)
Platelets: 248 10*3/uL (ref 150–400)
RBC: 3.39 MIL/uL — ABNORMAL LOW (ref 3.87–5.11)
RDW: 16.3 % — ABNORMAL HIGH (ref 11.5–15.5)
WBC: 8.9 10*3/uL (ref 4.0–10.5)
nRBC: 0 % (ref 0.0–0.2)

## 2021-10-09 LAB — RENAL FUNCTION PANEL
Albumin: 2.7 g/dL — ABNORMAL LOW (ref 3.5–5.0)
Anion gap: 10 (ref 5–15)
BUN: 28 mg/dL — ABNORMAL HIGH (ref 6–20)
CO2: 29 mmol/L (ref 22–32)
Calcium: 8.9 mg/dL (ref 8.9–10.3)
Chloride: 94 mmol/L — ABNORMAL LOW (ref 98–111)
Creatinine, Ser: 5.21 mg/dL — ABNORMAL HIGH (ref 0.44–1.00)
GFR, Estimated: 9 mL/min — ABNORMAL LOW (ref >60)
Glucose, Bld: 151 mg/dL — ABNORMAL HIGH (ref 70–99)
Phosphorus: 3.7 mg/dL (ref 2.5–4.6)
Potassium: 3.7 mmol/L (ref 3.5–5.1)
Sodium: 133 mmol/L — ABNORMAL LOW (ref 135–145)

## 2021-10-09 LAB — GLUCOSE, CAPILLARY
Glucose-Capillary: 137 mg/dL — ABNORMAL HIGH (ref 70–99)
Glucose-Capillary: 164 mg/dL — ABNORMAL HIGH (ref 70–99)
Glucose-Capillary: 108 mg/dL — ABNORMAL HIGH (ref 70–99)
Glucose-Capillary: 146 mg/dL — ABNORMAL HIGH (ref 70–99)

## 2021-10-09 MED ORDER — METOPROLOL SUCCINATE ER 50 MG PO TB24
50.0000 mg | ORAL_TABLET | Freq: Every day | ORAL | 0 refills | Status: AC
Start: 2021-10-09 — End: ?
  Filled 2021-10-09: qty 30, 30d supply, fill #0

## 2021-10-09 MED ORDER — GABAPENTIN 100 MG PO CAPS
100.0000 mg | ORAL_CAPSULE | Freq: Every day | ORAL | 0 refills | Status: AC
Start: 2021-10-09 — End: ?
  Filled 2021-10-09: qty 30, 30d supply, fill #0

## 2021-10-09 MED ORDER — ACETAMINOPHEN 500 MG PO TABS: 500.0000 mg | ORAL_TABLET | Freq: Two times a day (BID) | ORAL | 0 refills | Status: AC | PRN

## 2021-10-09 MED ORDER — DOCUSATE SODIUM 100 MG PO CAPS
100.0000 mg | ORAL_CAPSULE | Freq: Every day | ORAL | 0 refills | Status: AC
Start: 2021-10-10 — End: ?
  Filled 2021-10-09: qty 30, 30d supply, fill #0

## 2021-10-09 MED ORDER — SORBITOL 70 % SOLN
30.0000 mL | Freq: Every day | 0 refills | Status: AC | PRN
  Filled 2021-10-09: qty 473, 15d supply, fill #0

## 2021-10-09 MED ORDER — PRAZOSIN HCL 1 MG PO CAPS
1.0000 mg | ORAL_CAPSULE | Freq: Every evening | ORAL | 0 refills | Status: AC
  Filled 2021-10-09: qty 30, 30d supply, fill #0

## 2021-10-09 MED ORDER — OXYCODONE-ACETAMINOPHEN 5-325 MG PO TABS
1.0000 | ORAL_TABLET | Freq: Four times a day (QID) | ORAL | Status: DC | PRN
  Administered 2021-10-09: 1 via ORAL
  Filled 2021-10-09: qty 1

## 2021-10-09 MED ORDER — HEPARIN SODIUM (PORCINE) 1000 UNIT/ML IJ SOLN
INTRAMUSCULAR | Status: AC
  Administered 2021-10-09: 3200 [IU]
  Filled 2021-10-09: qty 4

## 2021-10-09 NOTE — Progress Notes (Signed)
Physical Therapy Discharge Summary ? ?Patient Details  ?Name: MARABELLE CUSHMAN ?MRN: [id number] ?Date of Birth: 03-Aug-1960 ? ?Patient has met 5 of 7 long term goals due to improved activity tolerance, improved balance, increased strength, ability to compensate for deficits, and improved coordination.  Patient to discharge at a wheelchair level Supervision. Patient's care partner is independent to provide the necessary physical and cognitive assistance at discharge. [name] son, [name], did not attend family education training despite encouragement, due to personal schedule and issues with transportation. However, [name] has verbalized confidence with all tasks to care for pt. [name] son's friend plans on picking up pt from hospital.  ? ?Reasons goals not met: Pt did not meet dynamic sitting goal of Independent, as pt is currently Mod I due to decreased trunk control and generalized weakness. Pt also did not meet bed mobility goal of Mod I as pt currently requires supervision due to need for cues to maintain LLE NWB precautions.  ? ?Recommendation:  ?Patient will benefit from ongoing skilled PT services in home health setting to continue to advance safe functional mobility, address ongoing impairments in transfers, generalized strengthening and endurance, dynamic standing balance/coordination, adherence to LLE NWB precautions, and to minimize fall risk. ? ?Equipment: ?Slideboard, pt reports already having WC ? ?Reasons for discharge: treatment goals met and discharge from hospital ? ?Patient/family agrees with progress made and goals achieved: Yes ? ?PT Discharge ?Precautions/Restrictions ?Precautions ?Precautions: Fall ?Precaution Comments: wound vac removed 5/12 and hard cast applied ?Required Braces or Orthoses: Splint/Cast ?Splint/Cast: hard fiberglass cast ?Splint/Cast - Date Prophylactic Dressing Applied (if applicable): 62/37/62 ?Restrictions ?Weight Bearing Restrictions: Yes ?RLE Weight Bearing: Weight bearing as  tolerated ?LLE Weight Bearing: Non weight bearing ?Pain Interference ?Pain Interference ?Pain Effect on Sleep: 0. Does not apply - I have not had any pain or hurting in the past 5 days ?Pain Interference with Therapy Activities: 0. Does not apply - I have not received rehabilitationtherapy in the past 5 days ?Pain Interference with Day-to-Day Activities: 1. Rarely or not at all ?Cognition ?Overall Cognitive Status: History of cognitive impairments - at baseline ?Arousal/Alertness: Awake/alert ?Orientation Level: Oriented X4 ?Memory: Appears intact ?Awareness: Impaired ?Problem Solving: Impaired ?Safety/Judgment: Impaired ?Comments: Can state NWB status for LLE but unable to carry over with functional mobility ?Sensation ?Sensation ?Light Touch: Impaired Detail ?Proprioception: Impaired by gross assessment ?Additional Comments: absent sensation along R medial and lateral ankle and along L great toe. Pt frequently opening eyes during sensation testing despite therapist asking her to close them. Pt reports numbness along R ankle at baseline. ?Coordination ?Gross Motor Movements are Fluid and Coordinated: No ?Fine Motor Movements are Fluid and Coordinated: No ?Coordination and Movement Description: generalized weakness/deconditioning and decreased balance strategies due to LLE NWB precautions ?Finger Nose Finger Test: slow bilaterally ?Heel Shin Test: impaired coordination bilaterally and decreased strength/ROM on LLE ?Motor  ?Motor ?Motor: Within Functional Limits ?Motor - Skilled Clinical Observations: generalized weakness/deconditioning and decreased balance strategies due to LLE NWB precautions  ?Mobility ?Bed Mobility ?Bed Mobility: Rolling Right;Rolling Left;Sit to Supine;Supine to Sit ?Rolling Right: Supervision/verbal cueing ?Rolling Left: Supervision/Verbal cueing ?Supine to Sit: Supervision/Verbal cueing ?Sit to Supine: Supervision/Verbal cueing ?Transfers ?Transfers: Lateral/Scoot Transfers ?Lateral/Scoot  Transfers: Supervision/Verbal cueing ?Transfer (Assistive device): Other (Comment) (slideboard) ?Locomotion  ?Gait ?Ambulation: No ?Gait ?Gait: No ?Stairs / Additional Locomotion ?Stairs: No ?Wheelchair Mobility ?Wheelchair Mobility: Yes ?Wheelchair Assistance: Supervision/Verbal cueing ?Wheelchair Propulsion: Both upper extremities ?Wheelchair Parts Management: Needs assistance ?Distance: 19ft  ?Trunk/Postural Assessment  ?Cervical Assessment ?Cervical  Assessment: Within Functional Limits ?Thoracic Assessment ?Thoracic Assessment: Within Functional Limits ?Lumbar Assessment ?Lumbar Assessment: Exceptions to [facility] (posterior pelvic tilt in sitting) ?Postural Control ?Postural Control: Within Functional Limits  ?Balance ?Balance ?Balance Assessed: Yes ?Static Sitting Balance ?Static Sitting - Balance Support: No upper extremity supported;Feet supported ?Static Sitting - Level of Assistance: 6: Modified independent (Device/Increase time) ?Dynamic Sitting Balance ?Dynamic Sitting - Balance Support: No upper extremity supported;Feet supported;During functional activity ?Dynamic Sitting - Level of Assistance: 6: Modified independent (Device/Increase time) ?Extremity Assessment  ?RLE Assessment ?RLE Assessment: Exceptions to [facility] ?RLE Strength ?Right Hip Flexion: 4-/5 ?Right Hip ABduction: 4-/5 ?Right Hip ADduction: 4-/5 ?Right Knee Flexion: 3+/5 ?Right Knee Extension: 3+/5 ?Right Ankle Dorsiflexion: 2+/5 ?Right Ankle Plantar Flexion: 2+/5 ?LLE Assessment ?LLE Assessment: Exceptions to [facility] ?LLE Strength ?Left Hip Flexion: 3-/5 ?Left Hip ABduction: 3+/5 ?Left Hip ADduction: 3+/5 ?Left Knee Flexion: 3-/5 ?Left Knee Extension: 3-/5 ? ? ?Blenda Nicely ?Becky Sax PT, DPT  ?10/09/2021, 6:54 AM ?

## 2021-10-09 NOTE — Progress Notes (Signed)
Inpatient Rehabilitation Discharge Medication Review by a Pharmacist  A complete drug regimen review was completed for this patient to identify any potential clinically significant medication issues.  High Risk Drug Classes Is patient taking? Indication by Medication  Antipsychotic Yes Latuda- bipolar  Anticoagulant No   Antibiotic No   Opioid No   Antiplatelet No   Hypoglycemics/insulin Yes Semaglutide- T2DM  Vasoactive Medication Yes Toprol, Minipress- hypertension  Chemotherapy No   Other Yes Cogentin- EPS  Lipitor- HLD Prozac- MDD Trazodone- sleep     Type of Medication Issue Identified Description of Issue Recommendation(s)  Drug Interaction(s) (clinically significant)     Duplicate Therapy     Allergy     No Medication Administration End Date     Incorrect Dose     Additional Drug Therapy Needed     Significant med changes from prior encounter (inform family/care partners about these prior to discharge).    Other  Aranesp Aranesp- anemia 2/2 ESRD HD dependant to be prescribed by Nephrology at hemodialysis    Clinically significant medication issues were identified that warrant physician communication and completion of prescribed/recommended actions by midnight of the next day:  No  Time spent performing this drug regimen review (minutes):  30   Lakeisha Waldrop BS, PharmD, BCPS Clinical Pharmacist 10/10/2021 8:06 AM  Contact: [phone] after 3 PM  "Be curious, not judgmental..." -Jamal Maes

## 2021-10-09 NOTE — Progress Notes (Signed)
?[facility] ?Progress Note  ? ?Subjective:   Patient seen and examined at bedside.  Eating lunch.  No specific complaints.  Episode of vomiting yesterday.  No n/v/d, CP, SOB or abdominal pain today.  ? ?Objective ?Vitals:  ? 10/08/21 0500 10/08/21 1323 10/08/21 1930 10/09/21 0400  ?BP:  103/63 106/66 124/72  ?Pulse:  84 77 95  ?Resp:  16 16 16   ?Temp:  (!) 97.4 ?F (36.3 ?C) 97.9 ?F (36.6 ?C) 98.6 ?F (37 ?C)  ?TempSrc:    Oral  ?SpO2:  99% 99% 95%  ?Weight: 83.5 kg   84.2 kg  ?Height:      ? ?Physical Exam ?General:Chronically ill appearing female in NAD ?Heart:RRR, no mrg ?Lungs:CTAB, nml WOB ?Abdomen:soft, NTND ?Extremities:no LE edema ?Dialysis Access: LU AVF maturing, TDC  ? ?Filed Weights  ? 10/07/21 1720 10/08/21 0500 10/09/21 0400  ?Weight: 84 kg 83.5 kg 84.2 kg  ? ? ?Intake/Output Summary (Last 24 hours) at 10/09/2021 1327 ?Last data filed at 10/09/2021 0700 ?Gross per 24 hour  ?Intake 236 ml  ?Output 300 ml  ?Net -64 ml  ? ? ?Additional Objective ?Labs: ?Basic Metabolic Panel: ?Recent Labs  ?Lab 10/04/21 ?7371 10/06/21 ?0626  ?NA 129* 129*  ?K 4.0 4.3  ?CL 92* 94*  ?CO2 27 25  ?GLUCOSE 191* 179*  ?BUN 24* 25*  ?CREATININE 4.87* 4.74*  ?CALCIUM 8.6* 8.6*  ?PHOS 4.2 3.9  ? ?Liver Function Tests: ?Recent Labs  ?Lab 10/04/21 ?9485 10/06/21 ?4627  ?ALBUMIN 2.8* 2.6*  ? ?CBC: ?Recent Labs  ?Lab 10/06/21 ?0611  ?WBC 10.5  ?HGB 9.3*  ?HCT 29.9*  ?MCV 95.2  ?PLT 218  ? ? ?Medications: ? ? atorvastatin  40 mg Oral Q supper  ? benztropine  1 mg Oral QPM  ? Chlorhexidine Gluconate Cloth  6 each Topical Q0600  ? darbepoetin (ARANESP) injection - DIALYSIS  100 mcg Intravenous Q Mon-HD  ? docusate sodium  100 mg Oral Daily  ? enoxaparin (LOVENOX) injection  30 mg Subcutaneous Q24H  ? feeding supplement  237 mL Oral BID BM  ? FLUoxetine  60 mg Oral q morning  ? gabapentin  100 mg Oral Daily  ? insulin aspart  0-5 Units Subcutaneous QHS  ? insulin aspart  0-6 Units Subcutaneous TID WC  ? lurasidone  40 mg Oral  QHS  ? metoprolol succinate  50 mg Oral Daily  ? multivitamin  1 tablet Oral QHS  ? prazosin  1 mg Oral QHS  ? protein supplement  1 Scoop Oral TID WC  ? Semaglutide (1 MG/DOSE)  2 mg Subcutaneous Weekly  ? traZODone  300 mg Oral QHS  ? ? ?Dialysis Orders: ?New start, establishing orders. To go to [facility] on d/c ?  ?Assessment/Plan: ?Falls/syncope/left trimalleolar ankle fracture: The fall and syncope was likely related to worsening uremia. ORIF on L trimalleolar ankle Frx and pinning of dislocation on 4/25 by Dr. Marcelino Scot.  Wound vac d/c, with short leg cast placement on 5/13. Admitted to inpatient rehabilitation for continued strengthening to facilitate eventual discharge home. ?  ?2. ESRD: With progressive CKD to ESRD, now dependent on hemodialysis.  She is s/p left arm brachial artery to basilic vein AVF on 0/35 with Dr. Donzetta Matters ?- EDW likely around 73 - 84kg ?- HD per MWF schedule  ?- Note she will have a MWF schedule upon discharge at [place] ?  ?3. Anemia CKD: last Hgb 9.3. Likely secondary to anemia of CKD, continue  ongoing management with ESA.  Increased aranesp to 100 mcg weekly on mondays starting today. Last dose of iron course to be completed today.  ?  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 PTH low at 74.  Not on VDRA or binders.  ?  ?5. Hypertension: controlled on current regimen - Toprolol 50mg  QD ?  ?6. Bipolar disorder :on latuda. Trazadone and prozac. ?  ?7. DMT2 - on semglutide ?  ?6. Dispo - spoke with HD SW - she is to d/c on 5/18.  HD SW has alerted clinic to a potential start date of 5/19 ? ?Jen Mow, PA-C ?[facility] ?10/09/2021,1:27 PM ? LOS: 14 days  ? ? ?

## 2021-10-09 NOTE — Progress Notes (Signed)
Physical Therapy Session Note ? ?Patient Details  ?Name: Jessica Mercado ?MRN: [id number] ?Date of Birth: 1961/01/30 ? ?Today's Date: 10/09/2021 ?PT Individual Time: 1700-1749 and 4496-7591  ?PT Individual Time Calculation (min): 57 min and 55 min  ? ?Short Term Goals: ?Week 1:  PT Short Term Goal 1 (Week 1): Patient will adhere to weight bearing precautions with all mobility at least 50% of the time ?PT Short Term Goal 2 (Week 1): Patient will transfer bed <> wc with LRAD and ModA ?PT Short Term Goal 3 (Week 1): Patient will complete sit <> stand with LRAD and CGA while maintaining wbng precautions ?PT Short Term Goal 4 (Week 1): Patient will initiate wc mob ? ?Skilled Therapeutic Interventions/Progress Updates:  ? Treatment Session 1 ?Received pt semi-reclined in bed, pt agreeable to PT treatment, and denied any pain during session but reported fatigue from not sleeping well. Session with emphasis on discharge planning, dressing, functional mobility/transfers, generalized strengthening and endurance. Pt reported having hospital bed at home and transferred semi-reclined<>sitting EOB with supervision and donned shorts sitting EOB with supervision up to thighs but then returned to supine to pull pants over hips - despite education on lateral leans. Pt slow to move this morning, groaning with movement and telling therapist multiple times "I just woke up before you got in the room". Pt transferred bed<>WC via slideboard with supervision with total A to place board. Did not place step under R foot with transfer (as pt will not have one at home to do so) and pt visibly placing weight on LLE despite telling therapist "I'm not putting any weight on it". Pt performed WC mobility 143ft using BUE and supervision with increased time to dayroom (pt required numerous rest breaks due to UE fatigue). Pt performed seated RLE strengthening on Kinetron at 20 cm/sec for 1 minute x 4 trials with therapist providing manual counter  resistance with emphasis on glute/quad strength. Educated pt on [employer] parts management (including locking/unlocking brakes, donning/doffing legrests, and removing armrests). Pt able to lock/unlock brakes with supervision and cues but required assist for legrest and armrest management. Pt reports "[name] will do it all for me" but emphasized importance of gaining independence and pt in agreement. Returned to room and pt reported urge to void. Pt transferred WC<>bedside commode via slideboard with CGA/light min A and doffed pants with mod A (due to urgency) via lateral leans. Pt continent of bladder and performed peri-care with supervision. Pt left in care of NT and RN due to time restrictions.  ? ?Treatment Session 2 ?Received pt sitting in WC, pt agreeable to PT treatment, and denied any pain during session. Session with emphasis on functional mobility/transfers, dynamic sitting balance/coordination, generalized strengthening and endurance. Pt performed WC mobility 15ft using BUE and supervision - stopped due to UE fatigue and transported remainder of way to/from therapy gym dependently. Pt transferred on/off mat via slideboard with supervision and min A to place board - therapist holding LLE to prevent bearing weight. Worked on dynamic sitting balance, core control/strengthening, and reaching outside BOS playing connect four x 3 trials without foot support and 1 UE support and mod I. Pet therapy dog, Daisy, briefly present to uplift mood/spirits. Returned to room and returned to bed in preparation for dialysis. Pt transferred WC<>bed via slideboard with total A to place board with supervision overall - pt actually held her LLE off ground and maintained NWB precautions with no cues or assist. Sit<>supine with supervision and concluded session with pt sitting in  WC, needs within reach, and chair pad alarm on.  ? ?Therapy Documentation ?Precautions:  ?Precautions ?Precautions: Fall ?Precaution Comments: wound vac removed  5/12 and hard cast applied ?Required Braces or Orthoses: Splint/Cast ?Splint/Cast: hard fiberglass cast ?Splint/Cast - Date Prophylactic Dressing Applied (if applicable): 26/71/24 ?Restrictions ?Weight Bearing Restrictions: Yes ?RLE Weight Bearing: Weight bearing as tolerated ?LLE Weight Bearing: Non weight bearing ? ?Therapy/Group: Individual Therapy ?Blenda Nicely ?Becky Sax PT, DPT  ?10/09/2021, 6:49 AM  ?

## 2021-10-09 NOTE — Progress Notes (Signed)
Contacted pt's son-in-law, [name], this morning to review out-pt HD arrangements. Bill aware pt will need to start at [facility] on Friday. Bill to contact medicaid transportation today to make appt for Friday. Bill reminded that pt will need to arrive at 10:10 on Friday to complete paperwork prior to 11:10 chair time. Contacted [facility] to confirm pt's planned d/c for tomorrow and advise clinic pt will start on Friday. [name] son-in-law had questions relating to DME at d/c. Secure chat sent to CSW with son-in-law's request to call him when possible.  ? ?Melven Sartorius ?Renal Navigator ?[phone] ?

## 2021-10-09 NOTE — Progress Notes (Signed)
Patient slept well after having vomited x1 last night. Compazine IM given with good affect. VS remains stable. Chlorhexidine  G bath done this AM. Due dialysis today. Mood stable and affect appropriate this shift. Safety maintained at all times. ?

## 2021-10-09 NOTE — Progress Notes (Signed)
?                                                       PROGRESS NOTE ? ? ?Subjective/Complaints: ?Washing hair with OT ?Vomited last night and received compazine- feeling well this mornning ?Excited for d/c tomorrow.  ? ? ?ROS: Patient denies fever, rash, sore throat, blurred vision, dizziness, diarrhea, nausea, cough chest pain, shortness of breath, joint back or neck pain headache or change in mood. Positive for constipation, relieved with sorbitol. ?Objective: ?  ?No results found. ? ?No results for input(s): WBC, HGB, HCT, PLT in the last 72 hours. ? ? ?No results for input(s): NA, K, CL, CO2, GLUCOSE, BUN, CREATININE, CALCIUM in the last 72 hours. ? ? ?Intake/Output Summary (Last 24 hours) at 10/09/2021 1054 ?Last data filed at 10/09/2021 0700 ?Gross per 24 hour  ?Intake 236 ml  ?Output 300 ml  ?Net -64 ml  ?  ? ?Pressure Injury 09/25/21 Sacrum Medial;Upper Stage 2 -  Partial thickness loss of dermis presenting as a shallow open injury with a red, pink wound bed without slough. 1 mm x 29mm (Active)  ?09/25/21 2249  ?Location: Sacrum  ?Location Orientation: Medial;Upper  ?Staging: Stage 2 -  Partial thickness loss of dermis presenting as a shallow open injury with a red, pink wound bed without slough.  ?Wound Description (Comments): 1 mm x 62mm (Staged by DARA, RN)  ?Present on Admission: Yes  ? ? ?Physical Exam: ?Vital Signs ?Blood pressure 124/72, pulse 95, temperature 98.6 ?F (37 ?C), temperature source Oral, resp. rate 16, height 5\' 7"  (1.702 m), weight 84.2 kg, SpO2 95 %. ?Constitutional: No distress . Vital signs reviewed. BMI 29.66, Fatigue ?HEENT: NCAT, EOMI, oral membranes moist ?Neck: supple ?Cardiovascular: RRR without murmur. No JVD    ?Respiratory/Chest: CTA Bilaterally without wheezes or rales. Normal effort    ?GI/Abdomen: BS +, non-tender, non-distended, LBM 10/05/21 ?Ext: no clubbing, cyanosis, or edema ?Psych: flat but cooperative  ?Skin: right IJ TDC, left foot in splint, sacral wound stage  2 ?Neuro/MSK: Alert. Left foot in short leg cast, nonweightbearing status. Impaired problem solving, delayed in general. Decreased sensation in left arm, resolved, likely from nerve block. ?GU: anuric, dialysis MWF ? ?Assessment/Plan: ?1. Functional deficits which require 3+ hours per day of interdisciplinary therapy in a comprehensive inpatient rehab setting. ?Physiatrist is providing close team supervision and 24 hour management of active medical problems listed below. ?Physiatrist and rehab team continue to assess barriers to discharge/monitor patient progress toward functional and medical goals ? ?Care Tool: ? ?Bathing ?   ?Body parts bathed by patient: Right arm, Left arm, Chest, Abdomen, Right upper leg, Left upper leg, Right lower leg, Face, Front perineal area  ? Body parts bathed by helper: Buttocks ?Body parts n/a: Left lower leg (cast) ?  ?Bathing assist Assist Level: Minimal Assistance - Patient > 75% ?  ?  ?Upper Body Dressing/Undressing ?Upper body dressing   ?What is the patient wearing?: Pull over shirt ?   ?Upper body assist Assist Level: Set up assist ?   ?Lower Body Dressing/Undressing ?Lower body dressing ? ? ? Lower body dressing activity did not occur: Safety/medical concerns (Wound vac temp disconnected by nursing staff) ?What is the patient wearing?: Incontinence brief, Pants ? ?  ? ?Lower body assist Assist for lower body dressing:  Minimal Assistance - Patient > 75% ?   ? ?Toileting ?Toileting Toileting Activity did not occur (Probation officer and hygiene only): N/A (no void or bm)  ?Toileting assist Assist for toileting: Moderate Assistance - Patient 50 - 74% ?  ?  ?Transfers ?Chair/bed transfer ? ?Transfers assist ? Chair/bed transfer activity did not occur: Refused ? ?Chair/bed transfer assist level: Supervision/Verbal cueing (slideboard) ?  ?  ?Locomotion ?Ambulation ? ? ?Ambulation assist ? ? Ambulation activity did not occur: Safety/medical concerns (inability to maintain WB  precautions) ? ?  ?  ?   ? ?Walk 10 feet activity ? ? ?Assist ? Walk 10 feet activity did not occur: Safety/medical concerns (inability to maintain WB precautions) ? ?  ?   ? ?Walk 50 feet activity ? ? ?Assist Walk 50 feet with 2 turns activity did not occur: Safety/medical concerns (inability to maintain WB precautions) ? ?  ?   ? ? ?Walk 150 feet activity ? ? ?Assist Walk 150 feet activity did not occur: Safety/medical concerns (inability to maintain WB precautions) ? ?  ?  ?  ? ?Walk 10 feet on uneven surface  ?activity ? ? ?Assist Walk 10 feet on uneven surfaces activity did not occur: Safety/medical concerns (inability to maintain WB precautions) ? ? ?  ?   ? ?Wheelchair ? ? ? ? ?Assist Is the patient using a wheelchair?: Yes ?Type of Wheelchair: Manual ?Wheelchair activity did not occur: Refused ? ?Wheelchair assist level: Supervision/Verbal cueing ?Max wheelchair distance: 64ft  ? ? ?Wheelchair 50 feet with 2 turns activity ? ? ? ?Assist ? ?  ?Wheelchair 50 feet with 2 turns activity did not occur: Refused ? ? ?Assist Level: Supervision/Verbal cueing  ? ?Wheelchair 150 feet activity  ? ? ? ?Assist ? Wheelchair 150 feet activity did not occur: Safety/medical concerns (fatigue and weakness/deconditioning) ? ? ?   ? ?Blood pressure 124/72, pulse 95, temperature 98.6 ?F (37 ?C), temperature source Oral, resp. rate 16, height 5\' 7"  (1.702 m), weight 84.2 kg, SpO2 95 %. ? ? ? Medical Problem List and Plan: ?1. Functional deficits secondary to left trimalleolar ankle fracture ?            -patient may not shower until wound vac removal. ?            -ELOS/Goals: 14-16 days mod I ?           -Continue CIR therapies including PT, OT  ? -messaged SW to schedule caregiver training with son.  ? -XR reviewed and stable. Remove wound vac today and apply splint.  ?-5/13 Wound vac d/c, now in short leg cast ?2.  Impaired mobility, nonambulatory:  continue Lovenox.  ?3. Postoperative pain: N/A ?4. Bipolar disorder?: continue  Latuda ?5. Neuropsych: This patient is capable of making decisions on her own behalf. ?6. Left lower extremity trimalleolar fracture: wound avc to be removed Wednesday 5/10 by ortho, discussed with nursing and patient ?-5/13 Wound vac d/c, now in short leg cast ?5/14 doing well with short leg cast, discussed that leg is healing well.  ?7. Obesity: BMI 32.32 provide dietary education.Continue Semaglutide. Discussed with patient, Increased dose.  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 HTN: Monitor BP TID ?5/13 Prazosin added back per nephrology, 10/02/21, BP variable and can reahc 140-150's, monitor and trend ?5/14 SBP 130's-140's, CTM. ? ?  10/09/2021  ?  4:00 AM 10/08/2021  ?  7:30 PM 10/08/2021  ?  1:23 PM  ?Vitals with BMI  ?  Weight 185 lbs 10 oz    ?BMI 29.07    ?Systolic 086 578 469  ?Diastolic 72 66 63  ?Pulse 95 77 84  ?  ?11. T2DM: Hgb A1C- 5.7 (down from 6.6  a couple of  months ago)--was on glucotrol and ozempic PTA ?            --CBG (last 3)  ?Recent Labs  ?  10/08/21 ?1705 10/08/21 ?2105 10/09/21 ?0611  ?GLUCAP 120* 126* 137*  ?   -  ssi ? continueSemaglutide.--first dose 09/30/21 from home. Increase Semglutide to 2mg , much better control, continue this dose at hom ? -cover with ssi for now ? -change prosource to with meals.  ?5/13 Continue to monitor CBG's 199-223 ?5/14 Continue 2 mg Semglutide and SSI ?12. Bipolar disorder: On Latuda, Trazodone and Prozac.  ?--Parkinsonism/EPS  managed with benztropine.  ?-might benefit from neuro-psych eval ?13. Anemia of chronic disease: continue to monitor Hgb with HD labs.  ?14. Screening for vitamin D deficiency: Vitamin D, 25 hydroxyl-: Level high normal @ 93.97. Does not need supplementation ?15. Hyponatremia: normalized, continue to monitor Na with HD labs ?16. Incontinent of urine: bladder program ordered.  ?17. Poor po intake ? 5/7 -reached out nephro. [place] for reg CM diet ?18. Constipation: continue colace daily.  ?19. Obesity BMI 32.42: change prosource to with  meals.  ?20. Insomnia: continue trazodone ?-5/13 sleeping well. ?21. Left arm numbness: discussed that nerve block should wear off today ?-5/13 Improving today, barely noticeable ?5/14 left arm numbness resolved

## 2021-10-09 NOTE — Progress Notes (Signed)
Occupational Therapy Session Note ? ?Patient Details  ?Name: Jessica Mercado ?MRN: [id number] ?Date of Birth: 1960/11/06 ? ?Today's Date: 10/09/2021 ?OT Individual Time: 8563-1497 ?OT Individual Time Calculation (min): 73 min  ? ? ?Short Term Goals: ?Week 2:  OT Short Term Goal 1 (Week 2): STG = LTG 2/2 ELOS ? ?Skilled Therapeutic Interventions/Progress Updates:  ?Skilled OT intervention completed with focus on ADL retraining, d/c planning, activity tolerance. Pt received upright in bed, no c/o pain, initially declining OOB/out of room therapy, however motivated to wash hair with therapist utilizing task at sink 2/2 HD port as way to encourage pt participation with functional transfers and BUE endurance. Completed bed mobility with supervision, then total A for board placement with pt able to verbalize 1st step correctly (leaning for board placement). Transferred with supervision this session, with therapist guarding LLE for NWB. Cues needed to continue advancing as pt frequently stops and c/o fatigue, demonstrating self-limiting behaviors as seen before.  ? ?Seated in w/c at sink with use of hair washing tray, pt able to wash hair with shampoo/conditioner with min A for getting all areas, then total A for rinsing to prevent water spillage. With encouragement for BUE endurance, pt dried hair and groomed it with supervision with cues needed for managing buttons and giving effort with task.  ? ?Min A needed for donning leg rests, with minimal carryover of w/c management education provided yesterday. Self-propelled in w/c to promote endurance with w/c mobility needed for home management, with supervision and max encouragement from room to 4w gym about 30 ft. Pt required cues to avoid walls, and to start prior to w/c mobility to unlock brakes. Seated in w/c, pt participated in cornhole game on each UE without w/c arm rest on parallel side to promote lateral leaning needed for toileting during task as well as light AROM and  endurance of BUE. Transported pt with total A back to room in w/c 2/2 fatigue, then was left seated in w/c with chair alarm on, NT present changing bed sheets at direct care handoff. ? ? ?Therapy Documentation ?Precautions:  ?Precautions ?Precautions: Fall ?Precaution Comments: wound vac removed 5/12 and hard cast applied ?Required Braces or Orthoses: Splint/Cast ?Splint/Cast: hard fiberglass cast ?Splint/Cast - Date Prophylactic Dressing Applied (if applicable): 02/63/78 ?Restrictions ?Weight Bearing Restrictions: Yes ?RLE Weight Bearing: Weight bearing as tolerated ?LLE Weight Bearing: Non weight bearing ? ? ? ?Therapy/Group: Individual Therapy ? ?[place] ?10/09/2021, 7:42 AM ?

## 2021-10-09 NOTE — Progress Notes (Signed)
New Dialysis Start  ? ?Patient identified as new dialysis start. Kidney Education packet assembled and given. Discussed the following items with patient:   ? ?Current medications and possible changes once started:  Discussed that patient's medications may change over time.  Ex; hypertension medications and diabetes medication.  Nephrologists will adjust as needed. ? ?Fluid restrictions reviewed:  32 oz daily goal:  All liquids count; soups, ice, jello  ? ?Phosphorus and potassium: Handout given showing high potassium and phosphorus foods.  Alternative food and drink options given. ? ?Family support:  Patient reports that she lives with her son in law and he will be available to provide assistance when needed, grocery shopping etc ? ?Outpatient Clinic Resources:  Discussed roles of Outpatient clinic  staff and advised to make a list of needs, if any, to talk with outpatient staff once she starts dialysis ? ?Care plan schedule: Informed patient  of Care Plans in outpatient setting and to participate in the care plan if possible.  An invitation would be given from outpatient clinic.  ? ?Dialysis Access Options:  Reviewed access options with patients. Discussed in detail about care at home with new AVG & AVF. Reviewed checking bruit and thrill. If dialysis catheter present, educated that patient could not take showers.  Catheter dressing changes were to be done by outpatient clinic staff only. ? ?Home therapy options:  Educated patient about home therapy options:  PD vs home hemo.  Patient stated she is not interested at this time in home therapies. ?  ?Patient verbalized understanding. Will continue to round on patient during admission.   ? ?Lilia Argue, RN   ?

## 2021-10-10 ENCOUNTER — Other Ambulatory Visit (HOSPITAL_COMMUNITY): Payer: Self-pay

## 2021-10-10 DIAGNOSIS — F319 Bipolar disorder, unspecified: Secondary | ICD-10-CM

## 2021-10-10 DIAGNOSIS — D689 Coagulation defect, unspecified: Secondary | ICD-10-CM | POA: Insufficient documentation

## 2021-10-10 DIAGNOSIS — E871 Hypo-osmolality and hyponatremia: Secondary | ICD-10-CM | POA: Insufficient documentation

## 2021-10-10 DIAGNOSIS — T7840XA Allergy, unspecified, initial encounter: Secondary | ICD-10-CM | POA: Insufficient documentation

## 2021-10-10 DIAGNOSIS — R52 Pain, unspecified: Secondary | ICD-10-CM | POA: Insufficient documentation

## 2021-10-10 LAB — GLUCOSE, CAPILLARY
Glucose-Capillary: 125 mg/dL — ABNORMAL HIGH (ref 70–99)
Glucose-Capillary: 180 mg/dL — ABNORMAL HIGH (ref 70–99)
Glucose-Capillary: 129 mg/dL — ABNORMAL HIGH (ref 70–99)

## 2021-10-10 NOTE — Progress Notes (Signed)
INPATIENT REHABILITATION DISCHARGE NOTE   Discharge instructions by: Jeannene Patella PA  Verbalized understanding:yes  Skin care/Wound care healing?cast lt lower leg  Pain:none  IV's:none  Tubes/Drains:none  O2:none  Safety instructions:done; NWB left leg  Patient belongings:went home  Discharged to: home via PTAR  Discharged NGE:XBMW  Notes: [place] fax number: [phone]  SW to send updated discharge summary

## 2021-10-10 NOTE — Patient Care Conference (Signed)
Inpatient RehabilitationTeam Conference and Plan of Care Update Date: 10/09/2021   Time: 11:36 AM    Patient Name: Jessica Mercado      Medical Record Number: [id number]  Date of Birth: [birth year] Sex: Female         Room/Bed: 4W20C/4W20C-01 Payor Info: Payor: MEDICAID [place] / Plan: MEDICAID OF [place] / Product Type: *No Product type* /    Admit Date/Time:  09/25/2021  4:46 PM  Primary Diagnosis:  Ankle fracture, bimalleolar, closed, left, sequela  Hospital Problems: Principal Problem:   Ankle fracture, bimalleolar, closed, left, sequela Active Problems:   Pressure injury of skin    Expected Discharge Date: Expected Discharge Date: 10/10/21  Team Members Present: Physician leading conference: Dr. Leeroy Cha Social Worker Present: Ovidio Kin, LCSW Nurse Present: Dorien Chihuahua, RN PT Present: Becky Sax, PT OT Present: Jennefer Bravo, OT PPS Coordinator present : Gunnar Fusi, SLP     Current Status/Progress Goal Weekly Team Focus  Bowel/Bladder   Continent / incontient of Bowel. Anuric (HD) Client.Marland Kitchen LBM 10/06/21  Regain continence  Assist with Tiolet needs   Swallow/Nutrition/ Hydration             ADL's   Can be min to mod A for LB self-care depending on pt participation 2/2 self-limiting behaviors, supervision UB, Max A toileting  downgraded goals to CGA/supervision  d/c planning, functional endurance, safety awareness, functional transfers   Mobility   bed mobility supervision, slideboard transfers min A, WC mobility 50ft supervision  supervision/CGA, D/C'd gait and standing goals due to poor adherance to WB precautions  functional mobility/transfers, generalized strengthening and endurance, D/C planning, family education, and adherance to WB precautions   Communication             Safety/Cognition/ Behavioral Observations            Pain   Deies pain  <3/10.  Asses Q shift and addess   Skin   Surgical incision (Rt leg, Left arm) , Stage 2 pressure injury to sacrum   Promote healing, Prevent new skin breakdown  Assess Q shift and prn     Discharge Planning:  Patient discharging home with SIL to provide assistance. SIL decline physical family education and edu was completed by phone. Patient uses Medicaid transport to HD   Team Discussion: Cast off; splint on, to be addressed at OP office. CBGs improved but nausea post HD.  Patient on target to meet rehab goals: no, patient is supervision overall. Needs min - mod assist for self care and supervision - set up for upper body care but mod assist for toileting.   *See Care Plan and progress notes for long and short-term goals.   Revisions to Treatment Plan:  Downgraded goals  Teaching Needs: Safety, transfers, medications, dietary modification, etc.   Current Barriers to Discharge: Decreased caregiver support, Home enviroment access/layout, and Hemodialysis  Possible Resolutions to Barriers: Family education; declined by son in law [place] follow up services DME: [facility], slideboard     Medical Summary Current Status: cognitive issues secondary to chronic uremia, left sided trimalleolar ankle fracture, tye 2 diabetes mellitus with hyperglycemia, nausea  Barriers to Discharge: Medical stability;Weight bearing restrictions;Wound care;Hemodialysis;Weight;Behavior  Barriers to Discharge Comments: cognitive issues secondary to chronic uremia, left sided trimalleolar ankle fracture, tye 2 diabetes mellitus with hyperglycemia, nausea Possible Resolutions to [employer] Focus: nonweightbearing to left lower exremity until cleared by ortho, wound vac removed and splinted, increased semglutide to 2mg , compazine for nausea   Continued Need  for Acute Rehabilitation Level of Care: The patient requires daily medical management by a physician with specialized training in physical medicine and rehabilitation for the following reasons: Direction of a multidisciplinary physical rehabilitation program to maximize  functional independence : Yes Medical management of patient stability for increased activity during participation in an intensive rehabilitation regime.: Yes Analysis of laboratory values and/or radiology reports with any subsequent need for medication adjustment and/or medical intervention. : Yes   I attest that I was present, lead the team conference, and concur with the assessment and plan of the team.   Dorien Chihuahua B 10/10/2021, 7:39 AM

## 2021-10-10 NOTE — Progress Notes (Signed)
Confirmed with clinic that pt will d/c today and will start at clinic tomorrow. Contacted renal PA yesterday regarding clinic's need for orders for Friday.   Melven Sartorius Renal Navigator [phone]

## 2021-10-10 NOTE — Progress Notes (Signed)
PROGRESS NOTE   Subjective/Complaints: No new complaints this morning Roommate to pick her up today Appreciate SW assistance in obtaining sliding board for her   ROS: Patient denies fever, rash, sore throat, blurred vision, dizziness, diarrhea, nausea, cough chest pain, shortness of breath, joint back or neck pain headache or change in mood. Positive for constipation, relieved with sorbitol.  Objective:   No results found.  Recent Labs    10/09/21 1345  WBC 8.9  HGB 10.2*  HCT 32.0*  PLT 248     Recent Labs    10/09/21 1345  NA 133*  K 3.7  CL 94*  CO2 29  GLUCOSE 151*  BUN 28*  CREATININE 5.21*  CALCIUM 8.9     Intake/Output Summary (Last 24 hours) at 10/10/2021 1221 Last data filed at 10/10/2021 0736 Gross per 24 hour  Intake 120 ml  Output 987 ml  Net -867 ml     Pressure Injury 09/25/21 Sacrum Medial;Upper Stage 2 -  Partial thickness loss of dermis presenting as a shallow open injury with a red, pink wound bed without slough. 1 mm x 95mm (Active)  09/25/21 2249  Location: Sacrum  Location Orientation: Medial;Upper  Staging: Stage 2 -  Partial thickness loss of dermis presenting as a shallow open injury with a red, pink wound bed without slough.  Wound Description (Comments): 1 mm x 26mm (Staged by DARA, RN)  Present on Admission: Yes    Physical Exam: Vital Signs Blood pressure 111/78, pulse 93, temperature 98.4 F (36.9 C), temperature source Oral, resp. rate 16, height 5\' 7"  (1.702 m), weight 84.6 kg, SpO2 93 %. Constitutional: No distress . Vital signs reviewed. BMI 29.21, Fatigue HEENT: NCAT, EOMI, oral membranes moist Neck: supple Cardiovascular: RRR without murmur. No JVD    Respiratory/Chest: CTA Bilaterally without wheezes or rales. Normal effort    GI/Abdomen: BS +, non-tender, non-distended, LBM 10/05/21 Ext: no clubbing, cyanosis, or edema Psych: flat but cooperative  Skin: right IJ  TDC, left foot in splint, sacral wound stage 2 Neuro/MSK: Alert. Left foot in short leg cast, nonweightbearing status. Impaired problem solving, delayed in general. Sensation present left arm GU: anuric  Assessment/Plan: 1. Functional deficits which require 3+ hours per day of interdisciplinary therapy in a comprehensive inpatient rehab setting. Physiatrist is providing close team supervision and 24 hour management of active medical problems listed below. Physiatrist and rehab team continue to assess barriers to discharge/monitor patient progress toward functional and medical goals  Care Tool:  Bathing    Body parts bathed by patient: Right arm, Left arm, Chest, Abdomen, Right upper leg, Left upper leg, Right lower leg, Face, Front perineal area   Body parts bathed by helper: Buttocks Body parts n/a: Left lower leg (cast)   Bathing assist Assist Level: Minimal Assistance - Patient > 75%     Upper Body Dressing/Undressing Upper body dressing   What is the patient wearing?: Pull over shirt    Upper body assist Assist Level: Set up assist    Lower Body Dressing/Undressing Lower body dressing    Lower body dressing activity did not occur: Safety/medical concerns (Wound vac temp disconnected by nursing staff) What is  the patient wearing?: Incontinence brief, Pants     Lower body assist Assist for lower body dressing: Minimal Assistance - Patient > 75%     Toileting Toileting Toileting Activity did not occur (Clothing management and hygiene only): N/A (no void or bm)  Toileting assist Assist for toileting: Moderate Assistance - Patient 50 - 74%     Transfers Chair/bed transfer  Transfers assist  Chair/bed transfer activity did not occur: Refused  Chair/bed transfer assist level: Supervision/Verbal cueing (slideboard)     Locomotion Ambulation   Ambulation assist   Ambulation activity did not occur: Safety/medical concerns (inability to maintain WB precautions)           Walk 10 feet activity   Assist  Walk 10 feet activity did not occur: Safety/medical concerns (inability to maintain WB precautions)        Walk 50 feet activity   Assist Walk 50 feet with 2 turns activity did not occur: Safety/medical concerns (inability to maintain WB precautions)         Walk 150 feet activity   Assist Walk 150 feet activity did not occur: Safety/medical concerns (inability to maintain WB precautions)         Walk 10 feet on uneven surface  activity   Assist Walk 10 feet on uneven surfaces activity did not occur: Safety/medical concerns (inability to maintain WB precautions)         Wheelchair     Assist Is the patient using a wheelchair?: Yes Type of Wheelchair: Manual Wheelchair activity did not occur: Refused  Wheelchair assist level: Supervision/Verbal cueing Max wheelchair distance: 22ft    Wheelchair 50 feet with 2 turns activity    Assist    Wheelchair 50 feet with 2 turns activity did not occur: Refused   Assist Level: Supervision/Verbal cueing   Wheelchair 150 feet activity     Assist  Wheelchair 150 feet activity did not occur: Safety/medical concerns (fatigue and weakness/deconditioning)       Blood pressure 111/78, pulse 93, temperature 98.4 F (36.9 C), temperature source Oral, resp. rate 16, height 5\' 7"  (1.702 m), weight 84.6 kg, SpO2 93 %.    Medical Problem List and Plan: 1. Functional deficits secondary to left trimalleolar ankle fracture             -patient may not shower until wound vac removal.             -ELOS/Goals: 14-16 days mod I            d/c home today  -messaged SW to schedule caregiver training with son.   -XR reviewed and stable. Remove wound vac today and apply splint.  -5/13 Wound vac d/c, now in short leg cast 2.  Impaired mobility, nonambulatory:  d/c Lovenox on discharge 3. Postoperative pain: N/A 4. Bipolar disorder?: continue Latuda 5. Neuropsych: This patient  is capable of making decisions on her own behalf. 6. Left lower extremity trimalleolar fracture: wound avc to be removed Wednesday 5/10 by ortho, discussed with nursing and patient -5/13 Wound vac d/c, now in short leg cast 5/14 doing well with short leg cast, discussed that leg is healing well. Discussed that splint would be removed outpatient by ortho 7. Obesity: BMI 32.32 provide dietary education.Continue Semaglutide. Discussed with patient, Increased dose.  9. 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 HTN: Monitor BP TID 5/13 Prazosin added back per nephrology, 10/02/21, BP variable and can reahc 140-150's, monitor and trend 5/14 SBP 130's-140's, CTM.  10/10/2021    5:55 AM 10/10/2021    5:00 AM 10/09/2021    7:28 PM  Vitals with BMI  Weight  186 lbs 8 oz   BMI  03.0   Systolic 092  330  Diastolic 78  74  Pulse 93  85    11. T2DM: Hgb A1C- 5.7 (down from 6.6  a couple of  months ago)--was on glucotrol and ozempic PTA             --CBG (last 3)  Recent Labs    10/09/21 2016 10/10/21 0559 10/10/21 1152  GLUCAP 146* 125* 180*     -  ssi  continueSemaglutide.--first dose 09/30/21 from home. Increase Semglutide to 2mg , much better control, continue this dose at hom  -cover with ssi for now  -change prosource to with meals.  5/13 Continue to monitor CBG's 199-223 5/14 Continue 2 mg Semglutide and SSI 12. Bipolar disorder: On Latuda, Trazodone and Prozac.  --Parkinsonism/EPS  managed with benztropine.  -might benefit from neuro-psych eval 13. Anemia of chronic disease: continue to monitor Hgb with HD labs.  14. Screening for vitamin D deficiency: Vitamin D, 25 hydroxyl-: Level high normal @ 93.97. Does not need supplementation 15. Hyponatremia: normalized, continue to monitor Na with HD labs 16. Incontinent of urine: bladder program ordered.  17. Poor po intake  5/7 -reached out nephro. [place] for reg CM diet 18. Constipation: continue colace daily.  19. Obesity BMI 32.42: change  prosource to with meals.  20. Insomnia: continue trazodone -5/13 sleeping well. 21. Left arm numbness: discussed that nerve block should wear off today -5/13 Improving today, barely noticeable 5/14 left arm numbness resolved. 22. Nausea/vomiting 5/13 Pt had a bad 24-48 hrs with nausea and vomiting, possible due to adjusting to dialysis and anesthesia from new AV fistula.  Reports that this am still present bit better. 5/14 No issues with nausea or vomiting today, resolved. 23. Constipation -5/14 LBM  evening 10/05/21, relieved with 70 % sorbitol  LOS: 15 days A FACE TO FACE EVALUATION WAS PERFORMED  Martha Clan P Lynsey Ange 10/10/2021, 12:21 PM

## 2021-10-10 NOTE — Progress Notes (Signed)
Inpatient Rehabilitation Care Coordinator Discharge Note   Patient Details  Name: Jessica Mercado MRN: [id number] Date of Birth: 28-Jul-1960   Discharge location: Home  Length of Stay: 15 Days  Discharge activity level: wheelchair level Supervision  Home/community participation: friend, SIL  Patient response IB:BCWUGQ Literacy - How often do you need to have someone help you when you read instructions, pamphlets, or other written material from your doctor or pharmacy?: Sometimes  Patient response BV:QXIHWT Isolation - How often do you feel lonely or isolated from those around you?: Never  Services provided included: SW, Neuropsych, Pharmacy, TR, CM, RN, SLP, PT, OT, RD, MD  Financial Services:  Financial Services Utilized: Medicaid    Choices offered to/list presented to: n/a  Follow-up services arranged:  [place]: TBD upon insurance approval         Patient response to transportation need: Is the patient able to respond to transportation needs?: Yes In the past 12 months, has lack of transportation kept you from medical appointments or from getting medications?: No In the past 12 months, has lack of transportation kept you from meetings, work, or from getting things needed for daily living?: No    Comments (or additional information):  Patient/Family verbalized understanding of follow-up arrangements:  Yes  Individual responsible for coordination of the follow-up plan: [name] [phone]  Confirmed correct DME delivered: Dyanne Iha 10/10/2021    Dyanne Iha

## 2021-10-10 NOTE — Progress Notes (Signed)
[facility] Progress Note   Subjective:   Patient seen and examined at bedside.  Happy to be going home today. Reminded of dialysis schedule, fluid restrictions and low K diet.  Denies CP, SOB, abdominal pain and n/v/d.  Objective Vitals:   10/09/21 1833 10/09/21 1928 10/10/21 0500 10/10/21 0555  BP: 101/65 119/74  111/78  Pulse: 91 85  93  Resp: 15 17  16   Temp: 98.4 F (36.9 C) 98.2 F (36.8 C)  98.4 F (36.9 C)  TempSrc: Oral Oral  Oral  SpO2: 99% 98%  93%  Weight:   84.6 kg   Height:       Physical Exam General:chronically ill appearing female in NAD Heart:RRR, no mrg Lungs:CTAB, nml WOB on RA Abdomen:soft, NTND Extremities:no LE edema Dialysis Access: TDC, LU AVF maturing   Filed Weights   10/09/21 0400 10/09/21 1325 10/10/21 0500  Weight: 84.2 kg 93.2 kg 84.6 kg    Intake/Output Summary (Last 24 hours) at 10/10/2021 1043 Last data filed at 10/10/2021 0736 Gross per 24 hour  Intake 120 ml  Output 987 ml  Net -867 ml    Additional Objective Labs: Basic Metabolic Panel: Recent Labs  Lab 10/04/21 0554 10/06/21 0611 10/09/21 1345  NA 129* 129* 133*  K 4.0 4.3 3.7  CL 92* 94* 94*  CO2 27 25 29   GLUCOSE 191* 179* 151*  BUN 24* 25* 28*  CREATININE 4.87* 4.74* 5.21*  CALCIUM 8.6* 8.6* 8.9  PHOS 4.2 3.9 3.7   Liver Function Tests: Recent Labs  Lab 10/04/21 0554 10/06/21 0611 10/09/21 1345  ALBUMIN 2.8* 2.6* 2.7*   CBC: Recent Labs  Lab 10/06/21 0611 10/09/21 1345  WBC 10.5 8.9  HGB 9.3* 10.2*  HCT 29.9* 32.0*  MCV 95.2 94.4  PLT 218 248   Medications:   atorvastatin  40 mg Oral Q supper   benztropine  1 mg Oral QPM   Chlorhexidine Gluconate Cloth  6 each Topical Q0600   darbepoetin (ARANESP) injection - DIALYSIS  100 mcg Intravenous Q Mon-HD   docusate sodium  100 mg Oral Daily   enoxaparin (LOVENOX) injection  30 mg Subcutaneous Q24H   feeding supplement  237 mL Oral BID BM   FLUoxetine  60 mg Oral q morning    gabapentin  100 mg Oral Daily   insulin aspart  0-5 Units Subcutaneous QHS   insulin aspart  0-6 Units Subcutaneous TID WC   lurasidone  40 mg Oral QHS   metoprolol succinate  50 mg Oral Daily   multivitamin  1 tablet Oral QHS   prazosin  1 mg Oral QHS   protein supplement  1 Scoop Oral TID WC   Semaglutide (1 MG/DOSE)  2 mg Subcutaneous Weekly   traZODone  300 mg Oral QHS    Dialysis Orders: New start, establishing orders. To go to [place] [facility] on d/c   Assessment/Plan: Falls/syncope/left trimalleolar ankle fracture: The fall and syncope was likely related to worsening uremia. ORIF on L trimalleolar ankle Frx and pinning of dislocation on 4/25 by Dr. Marcelino Scot.  Wound vac d/c, with short leg cast placement on 5/13. Admitted to inpatient rehabilitation for continued strengthening to facilitate eventual discharge home.   2. ESRD: With progressive CKD to ESRD, now dependent on hemodialysis.  She is s/p left arm brachial artery to basilic vein AVF on 8/52 with Dr. Donzetta Matters - EDW likely around 25 - 84kg - HD per MWF schedule  - Note she will have a MWF  schedule upon discharge at [place]   3. Anemia CKD: last Hgb 10.2. 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  Increased aranesp to 100 mcg weekly on mondays starting today. Last dose of iron course to be completed today.    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 PTH low at 74.  Not on VDRA or binders.    5. Hypertension: controlled on current regimen - Toprolol 50mg  QD   6. Bipolar disorder :on latuda. Trazadone and prozac.   7. DMT2 - on semglutide   6. Dispo - spoke with HD SW - she is to d/c on 5/18.  HD SW has alerted clinic to a potential start date of 5/19  Jen Mow, PA-C [place] 10/10/2021,10:43 AM  LOS: 15 days

## 2021-10-10 NOTE — Discharge Summary (Signed)
Physician Discharge Summary  Patient ID: Jessica Mercado MRN: [id number] DOB/AGE: [birth year] 61 y.o.  Admit date: 09/25/2021 Discharge date: 10/10/2021  Discharge Diagnoses:  Principal Problem:   Ankle fracture, bimalleolar, closed, left, sequela Active Problems:   Hypertension associated with chronic kidney disease due to type 2 diabetes mellitus ([place])   Diabetes mellitus type 2 with complications (HCC)   Anemia associated with chronic renal failure   Gait instability   ESRD (end stage renal disease) (HCC)   Pressure injury of skin   Bipolar disorder ([place])   Discharged Condition: good  Significant Diagnostic Studies: DG Ankle Complete Left  Result Date: 10/04/2021 CLINICAL DATA:  Postoperative ankle ORIF EXAM: LEFT ANKLE COMPLETE - 3+ VIEW COMPARISON:  Left ankle radiographs 09/17/2021 FINDINGS: Redemonstration of overlying cast material which limits evaluation fine bony detail. Redemonstration of single long screw fixation of comminuted medial malleolar fracture. Unchanged mild diastasis of the fracture components. Redemonstration of distal fibular plate and screw fixation. Redemonstration of 2 pins from plantar approach traversing the posterior and mid subtalar joints and tibiotalar joint. Unchanged mildly displaced posterior malleolar fracture. Small plantar calcaneal heel spur. Mild spurring at the Achilles insertion on the calcaneus. IMPRESSION: 1. No significant change in alignment of trimalleolar fractures. 2. Status post medial and lateral malleolar ORIF and tibiotalar and subtalar pin fixation. Electronically Signed   By: Yvonne Kendall M.D.   On: 10/04/2021 10:18   DG Ankle Complete Left  Result Date: 09/17/2021 CLINICAL DATA:  Postop.  ORIF of the left ankle. EXAM: LEFT ANKLE COMPLETE - 3+ VIEW COMPARISON:  Left ankle radiographs 09/16/2021 FINDINGS: Interval lateral plate and screw fixation of the prior distal fibular fracture. Normal alignment. There is a new long screw fixating  the previously seen medial malleolar fracture. There is persistent diastasis measuring up to approximately 7 mm at the superior aspect of the medial malleolar fracture. There are 2 K-wires that anterior inferiorly and traverse the calcaneus, talar body, and distal tibia fixating the tibiotalar and posterior subtalar joints. Small plantar calcaneal heel spur. Minimal chronic enthesopathic change at the Achilles insertion on the calcaneus. Mildly displaced posterior malleolar fracture is again seen. IMPRESSION: Status post distal fibular and medial malleolar ORIF. Normal alignment of the distal fibula with mild diastasis of the superomedial malleolar fracture similar to prior. Mildly displaced posterior malleolar fracture. Pin fixation of the talus through the calcaneus. Electronically Signed   By: Yvonne Kendall M.D.   On: 09/17/2021 14:40   DG Ankle Complete Left  Result Date: 09/17/2021 CLINICAL DATA:  Left ankle ORIF EXAM: LEFT ANKLE COMPLETE - 2 VIEW COMPARISON:  None. FINDINGS: Fluoroscopic images were obtained intraoperatively and submitted for post operative interpretation. Left ankle ORIF with hardware in expected position, 7 images were obtained with 37 seconds of fluoroscopy time and 1.4 mGy. Please see the performing provider's procedural report for further detail. IMPRESSION: As above. Electronically Signed   By: Yetta Glassman M.D.   On: 09/17/2021 13:05   DG Ankle Complete Left  Result Date: 09/16/2021 CLINICAL DATA:  Ankle pain after fall EXAM: LEFT ANKLE COMPLETE - 3+ VIEW COMPARISON:  None. FINDINGS: Oblique distal fibular fracture, at and above the ankle joint. More horizontal medial malleolus fracture. Posterior malleolus fracture with posterior displacement. The ankle is lateral and posteriorly subluxed. IMPRESSION: Displaced trimalleolar ankle fractures with subluxed ankle joint. Electronically Signed   By: Jorje Guild M.D.   On: 09/16/2021 07:33   CT Ankle Left Wo  Contrast  Result Date: 09/16/2021  CLINICAL DATA:  Ankle trauma, fracture, xray done (Age >= 5y) EXAM: CT OF THE LEFT ANKLE WITHOUT CONTRAST TECHNIQUE: Multidetector CT imaging of the left ankle was performed according to the standard protocol. Multiplanar CT image reconstructions were also generated. RADIATION DOSE REDUCTION: This exam was performed according to the departmental dose-optimization program which includes automated exposure control, adjustment of the mA and/or kV according to patient size and/or use of iterative reconstruction technique. COMPARISON:  There is a radiograph FINDINGS: Bones/Joint/Cartilage There is a displaced trimalleolar ankle fracture. Medial malleolar fracture at the base of the malleolus is angulated with up to 6 mm displacement and with significant comminution. Comminuted lateral malleolar fracture demonstrates mild posterolateral displacement and angulation, degree of displacement up to 6 mm. Posterior malleolar fracture is displaced up to 1.1 cm and involves approximately 25% of the articular surface. Additionally, there is a fracture of the anteromedial tibial plafond involving approximately 20% of the articular surface. Prominent os navicularis. There is mild talonavicular osteoarthritis. Os trigonum. Ligaments Suboptimally assessed by CT. Muscles and Tendons There is thickening and ossification along the mid Achilles consistent with chronic Achilles tendinosis. The posterior tibial tendon abuts the medial malleolar fracture fragments but there is no definite tendon entrapment. Soft tissues There is significant ankle soft tissue swelling. IMPRESSION: Displaced and comminuted trimalleolar fracture as described above, with posterior malleolar and anteromedial tibial plafond fractures involving a significant degree of the distal tibia articular surface. The posterior tibial tendon abuts medial malleolar fracture fragments, but there is no definite tendon entrapment. Findings of  chronic mid Achilles tendinosis. Electronically Signed   By: Maurine Simmering M.D.   On: 09/16/2021 11:06   DG CHEST PORT 1 VIEW  Result Date: 10/03/2021 CLINICAL DATA:  Status post arteriovenous fistula creation. EXAM: PORTABLE CHEST 1 VIEW COMPARISON:  Sep 24, 2021. FINDINGS: Stable cardiomegaly. Right internal jugular dialysis catheter is unchanged in position. Stable elevated left hemidiaphragm is noted. Lungs are clear. Bony thorax is unremarkable. IMPRESSION: No active disease. Electronically Signed   By: Marijo Conception M.D.   On: 10/03/2021 10:46   DG Chest Port 1 View  Result Date: 09/24/2021 CLINICAL DATA:  Status post procedure. EXAM: PORTABLE CHEST 1 VIEW COMPARISON:  September 17, 2021. FINDINGS: Stable cardiomegaly. Right internal jugular dialysis catheter is unchanged. No pneumothorax is noted. Right lung is clear. Mild left basilar subsegmental atelectasis is noted. Bony thorax is unremarkable. IMPRESSION: Stable mild left basilar subsegmental atelectasis. Electronically Signed   By: Marijo Conception M.D.   On: 09/24/2021 14:38   DG CHEST PORT 1 VIEW  Result Date: 09/17/2021 CLINICAL DATA:  Postop ORIF LEFT ankle EXAM: PORTABLE CHEST 1 VIEW COMPARISON:  07/18/2021 FINDINGS: Large-bore central venous line with tip in distal SVC. Anterior cervical and posterior cervical fusion. Stable enlarged cardiac silhouette. Interval improvement in aeration lung bases. Persistent low lung volumes. IMPRESSION: 1. Improved aeration lung bases. 2. Introduction of large-bore central venous line without complication Electronically Signed   By: Suzy Bouchard M.D.   On: 09/17/2021 14:35   DG Ankle Left Port  Result Date: 09/16/2021 CLINICAL DATA:  Post reduction. EXAM: PORTABLE LEFT ANKLE - 2 VIEW COMPARISON:  Earlier same day. FINDINGS: Two views study shows fine bony detail obscured by overlying fiberglass. Trimalleolar fracture again noted with interval improvement in bony alignment status post closed  reduction. IMPRESSION: Interval improvement in alignment status post reduction. Electronically Signed   By: Misty Stanley M.D.   On: 09/16/2021 09:02   DG C-Arm  1-60 Min-No Report  Result Date: 09/24/2021 Fluoroscopy was utilized by the requesting physician.  No radiographic interpretation.   DG C-Arm 1-60 Min-No Report  Result Date: 09/17/2021 Fluoroscopy was utilized by the requesting physician.  No radiographic interpretation.   DG C-Arm 1-60 Min-No Report  Result Date: 09/17/2021 Fluoroscopy was utilized by the requesting physician.  No radiographic interpretation.   VAS [place] UPPER EXT VEIN MAPPING (PRE-OP AVF)  Result Date: 09/17/2021 UPPER EXTREMITY VEIN MAPPING Patient Name:  ARLETHA MARSCHKE  Date of Exam:   09/17/2021 Medical Rec #: [id number]       Accession #:    [id number] Date of Birth: January 29, 1961       Patient Gender: F Patient Age:   38 years Exam Location[facility]  [facility] Procedure:      VAS [place] UPPER EXT VEIN MAPPING (PRE-OP AVF) Referring Phys: Pearson Grippe --------------------------------------------------------------------------------  Indications: Pre-access. Performing Technologist: Archie Patten RVS  Examination Guidelines: A complete evaluation includes B-mode imaging, spectral Doppler, color Doppler, and power Doppler as needed of all accessible portions of each vessel. Bilateral testing is considered an integral part of a complete examination. Limited examinations for reoccurring indications may be performed as noted. +-----------------+-------------+----------+--------+ Right Cephalic   Diameter (cm)Depth (cm)Findings +-----------------+-------------+----------+--------+ Shoulder             0.20        0.60            +-----------------+-------------+----------+--------+ Prox upper arm       0.17        0.56            +-----------------+-------------+----------+--------+ Mid upper arm        0.24        0.86             +-----------------+-------------+----------+--------+ Dist upper arm       0.25        0.61            +-----------------+-------------+----------+--------+ Antecubital fossa    0.46        0.35            +-----------------+-------------+----------+--------+ Prox forearm         0.21        0.63            +-----------------+-------------+----------+--------+ Mid forearm          0.21        0.60            +-----------------+-------------+----------+--------+ Dist forearm         0.11        0.60            +-----------------+-------------+----------+--------+ Wrist                0.10        0.48            +-----------------+-------------+----------+--------+ +-----------------+-------------+----------+--------------+ Right Basilic    Diameter (cm)Depth (cm)   Findings    +-----------------+-------------+----------+--------------+ Shoulder                                not visualized +-----------------+-------------+----------+--------------+ Prox upper arm                          not visualized +-----------------+-------------+----------+--------------+ Mid upper arm  not visualized +-----------------+-------------+----------+--------------+ Dist upper arm       0.23        1.77                  +-----------------+-------------+----------+--------------+ Antecubital fossa    0.24        1.48                  +-----------------+-------------+----------+--------------+ Prox forearm         0.12        0.72     branching    +-----------------+-------------+----------+--------------+ Mid forearm                             not visualized +-----------------+-------------+----------+--------------+ Distal forearm                          not visualized +-----------------+-------------+----------+--------------+ Elbow                                   not visualized  +-----------------+-------------+----------+--------------+ Wrist                                   not visualized +-----------------+-------------+----------+--------------+ +-----------------+-------------+----------+--------------+ Left Cephalic    Diameter (cm)Depth (cm)   Findings    +-----------------+-------------+----------+--------------+ Shoulder                                not visualized +-----------------+-------------+----------+--------------+ Prox upper arm       0.15        0.48                  +-----------------+-------------+----------+--------------+ Mid upper arm        0.11        0.40                  +-----------------+-------------+----------+--------------+ Dist upper arm       0.18        0.46                  +-----------------+-------------+----------+--------------+ Antecubital fossa    0.20        0.53                  +-----------------+-------------+----------+--------------+ Prox forearm         0.24        1.04                  +-----------------+-------------+----------+--------------+ Mid forearm          0.24        0.75                  +-----------------+-------------+----------+--------------+ Dist forearm         0.21        0.61                  +-----------------+-------------+----------+--------------+ Wrist                0.24        0.47                  +-----------------+-------------+----------+--------------+ +-----------------+-------------+----------+--------------+ Left Basilic     Diameter (cm)Depth (cm)   Findings    +-----------------+-------------+----------+--------------+  Prox upper arm       0.45        1.53                  +-----------------+-------------+----------+--------------+ Mid upper arm        0.35        1.52                  +-----------------+-------------+----------+--------------+ Dist upper arm       0.36        1.66                   +-----------------+-------------+----------+--------------+ Antecubital fossa    0.26        1.38                  +-----------------+-------------+----------+--------------+ Prox forearm         0.12        0.47     branching    +-----------------+-------------+----------+--------------+ Mid forearm                             not visualized +-----------------+-------------+----------+--------------+ Distal forearm                          not visualized +-----------------+-------------+----------+--------------+ Elbow                                   not visualized +-----------------+-------------+----------+--------------+ Wrist                                   not visualized +-----------------+-------------+----------+--------------+ *See table(s) above for measurements and observations.  Diagnosing physician: Servando Snare MD Electronically signed by Servando Snare MD on 09/17/2021 at 4:04:04 PM.    Final     Labs:  Basic Metabolic Panel: Recent Labs  Lab 10/04/21 0554 10/06/21 0611 10/09/21 1345  NA 129* 129* 133*  K 4.0 4.3 3.7  CL 92* 94* 94*  CO2 27 25 29   GLUCOSE 191* 179* 151*  BUN 24* 25* 28*  CREATININE 4.87* 4.74* 5.21*  CALCIUM 8.6* 8.6* 8.9  PHOS 4.2 3.9 3.7    CBC: Recent Labs  Lab 10/06/21 0611 10/09/21 1345  WBC 10.5 8.9  HGB 9.3* 10.2*  HCT 29.9* 32.0*  MCV 95.2 94.4  PLT 218 248    CBG: Recent Labs  Lab 10/09/21 0611 10/09/21 1145 10/09/21 1841 10/09/21 2016 10/10/21 0559  GLUCAP 137* 164* 108* 146* 125*    Brief HPI:   Jessica Mercado is a 61 y.o. female with history of T2DM with retinopathy and neuropathy, bipolar disorder, nephropathy, right ankle fracture in the past, 2 to 3 months of recurrent falls was admitted on 09/17/2021 after a fall with left ankle fracture dislocation.  She was also noted to have nausea vomiting with fatigue, tremors and increasing confusion secondary to uremia.  Nephrology was consulted for input  and felt that patient had progressed to ESRD and hemodialysis initiated.  She underwent ORIF right ankle by Dr. Marcelino Scot on 04/25 with recommendations for strict NWB x8 weeks.  Splint was placed with incisional Prevena x2 weeks with Eliquis x 30 days for DVT prophylaxis at discharge.  PT OT was working with patient and she was noted to have difficulty with nonweightbearing status, difficulty  processing information and required mod assist with ADLs and mobility.  She declined SNF due to history of prior trauma and was showing good motivation as well as some progress.  CIR was recommended due to functional decline.   Hospital Course: Jessica Mercado was admitted to rehab 09/25/2021 for inpatient therapies to consist of PT and OT at least three hours five days a week. Past admission physiatrist, therapy team and rehab RN have worked together to provide customized collaborative inpatient rehab.  Tylenol was scheduled twice daily for pain control in addition to oxycodone as needed.  Mood was stable on Latuda.  Her blood pressures were monitored on TID basis and prazosin was added for better control.   Blood sugars monitored on ACHS basis initially and were noted to be well controlled.  Subcu heparin was used for DVT prophylaxis.  Hemodialysis was ongoing on MWF schedule and left basilic AV fistula was placed on 05/11 by Dr. Donzetta Matters.  Stage 2 ulcer on sacrum has healed is clean, dry and intact. LUE incision is healing well and is C/D/I.   She continued to have difficulty maintaining nonweightbearing status therefore ambulation goals were discontinued did goals modified to sliding board transfers for safety.  Wound VAC was removed on 05/13 and short leg cast was placed. Follow up X rays were stable. Pain is controlled with tylenol prn.    Po intake was noted to be poor therefore diet was liberalized to regular per nephrology input. Her diabetes was monitored with ac/hs CBG checks and BS have improved with resumption of  semaglutide. Protein supplement was added to help promote wound healing. She was unable to meet goals set due to poor carryover and inconsistent participation. She requires CGA/supervision for safety at discharge.     Rehab course: During patient's stay in rehab weekly team conferences were held to monitor patient's progress, set goals and discuss barriers to discharge. At admission, patient required mod assist with ADL tasks and min assist with tranfers but poor adherence to NWB status. She  has had improvement in activity tolerance, balance, postural control as well as ability to compensate for deficits. She requires min assist with ADL tasks. She requires supervision for SB transfers and for wheelchair mobility. Family was unable to attend education.      Discharge disposition: 01-Home or Self Care  Diet: Carb modified/Renal diet w/1200 cc Fluid restriction.   Special Instructions: No weight on left leg. Use wheelchair till cleared by orthopedics No showers.   Allergies as of 10/10/2021       Reactions   Vancomycin Other (See Comments)   Red man syndrome        Medication List     STOP taking these medications    apixaban 2.5 MG Tabs tablet Commonly known as: ELIQUIS       TAKE these medications    acetaminophen 500 MG tablet Commonly known as: TYLENOL Take 1 tablet (500 mg total) by mouth 2 (two) times daily as needed. What changed:  when to take this reasons to take this   atorvastatin 40 MG tablet Commonly known as: LIPITOR Take 40 mg by mouth daily.   benztropine 1 MG tablet Commonly known as: COGENTIN Take 1 mg by mouth every evening.   docusate sodium 100 MG capsule Commonly known as: COLACE Take 1 capsule (100 mg total) by mouth daily.   FLUoxetine 20 MG capsule Commonly known as: PROZAC Take 60 mg by mouth every morning.   gabapentin 100 MG capsule  Commonly known as: NEURONTIN Take 1 capsule (100 mg total) by mouth daily.   Latuda 40 MG Tabs  tablet Generic drug: lurasidone Take 40 mg by mouth at bedtime.   metoprolol succinate 50 MG 24 hr tablet Commonly known as: TOPROL-XL Take 1 tablet (50 mg total) by mouth daily.   multivitamin Tabs tablet Take 1 tablet by mouth at bedtime.   prazosin 1 MG capsule Commonly known as: MINIPRESS Take 1 capsule (1 mg total) by mouth every evening.   Semaglutide (1 MG/DOSE) 4 MG/3ML Sopn Inject 1 mg into the skin every Monday.   sorbitol 70 % Soln Take 30 mLs by mouth daily as needed for moderate constipation.   traZODone 100 MG tablet Commonly known as: DESYREL Take 300 mg by mouth at bedtime.        Follow-up Information     Vassie Moment, MD Follow up.   Specialty: Family Medicine Why: call for follow up appointment Contact information: [place] [address] [phone]         Izora Ribas, MD Follow up.   Specialty: Physical Medicine and Rehabilitation Why: office will call you with follow up appointment Contact information: [address] [place] [address] [phone]         Altamese Olmito and Olmito, MD Follow up.   Specialty: Orthopedic Surgery Why: call for follow up appointment Contact information: [place] [address] [phone]         Waynetta Sandy, MD Follow up on 11/06/2021.   Specialties: Vascular Surgery, Cardiology Why: BE there at 8 am for post op appointment. Contact information: [address] [place] [address] [place], [facility]. Go on 10/11/2021.   Why: Schedule is Monday,Wednesday, Friday with 11:10 chair time.  Please arrive at 10:10 on Friday, May 19 to complete paperwork prior to treatment. Contact information: [address] [place] [address] [phone]                 Signed: Bary Leriche 10/10/2021, 9:50 AM

## 2021-10-10 NOTE — Progress Notes (Addendum)
Patient ID: Jessica Mercado, female   DOB: 08/19/1960, 61 y.o.   MRN: [id number]  Patient [facility] transport scheduled to arrive ASAP. D/c date left a nursing station.

## 2021-10-10 NOTE — Progress Notes (Signed)
Patient ID: Jessica Mercado, female   DOB: 1960/10/10, 61 y.o.   MRN: [id number]  Sw followed up with patient to informed patient that DME ordered through [employer] was canceled by SIL. Sw provided patient with the number for [employer] to proceed with the order if she prefers. SW will continue to attempt to reach patient SIL. Patient is unaware what time her transportation will arrive. SW informed patient if her family is not present or provided her d/c time medical transport will be scheduled. No additional questions or concerns.

## 2021-10-10 NOTE — Progress Notes (Signed)
Patient ID: Jessica Mercado, female   DOB: April 04, 1961, 61 y.o.   MRN: [id number]  SW spoke with patient SIL and he plans on moving forward with the transfer board. SIL would not like to purchase the [facility]. Sw will reach out to [employer] and have them move forward with the order and reach out to patient SIL again.

## 2021-10-12 ENCOUNTER — Telehealth: Payer: Self-pay | Admitting: Physician Assistant

## 2021-10-12 NOTE — Telephone Encounter (Signed)
Transition of care contact from inpatient facility  Date of Discharge: 10/10/21 Date of Contact: 10/12/21 Method of contact: Phone  Attempted to contact patient to discuss transition of care from inpatient admission. Patient did not answer the phone. Unable to leave message. Will follow up at outpatient HD.  Anice Paganini, PA-C 10/12/2021, 12:24 PM  [facility] Pager: [phone]

## 2021-10-14 ENCOUNTER — Encounter (HOSPITAL_COMMUNITY): Payer: Self-pay | Admitting: Vascular Surgery

## 2021-10-17 ENCOUNTER — Emergency Department (HOSPITAL_COMMUNITY)
Admission: EM | Admit: 2021-10-17 | Discharge: 2021-10-17 | Disposition: A | Discharge status: Home / Self Care | Payer: Medicaid Other | Attending: Emergency Medicine | Admitting: Emergency Medicine

## 2021-10-17 ENCOUNTER — Emergency Department (HOSPITAL_COMMUNITY): Payer: Medicaid Other

## 2021-10-17 DIAGNOSIS — A0472 Enterocolitis due to Clostridium difficile, not specified as recurrent: Secondary | ICD-10-CM | POA: Diagnosis not present

## 2021-10-17 DIAGNOSIS — R109 Unspecified abdominal pain: Secondary | ICD-10-CM | POA: Diagnosis present

## 2021-10-17 DIAGNOSIS — R55 Syncope and collapse: Secondary | ICD-10-CM | POA: Diagnosis not present

## 2021-10-17 DIAGNOSIS — Z79899 Other long term (current) drug therapy: Secondary | ICD-10-CM | POA: Insufficient documentation

## 2021-10-17 LAB — C DIFFICILE QUICK SCREEN W PCR REFLEX
C Diff antigen: POSITIVE — AB
C Diff toxin: NEGATIVE

## 2021-10-17 LAB — COMPREHENSIVE METABOLIC PANEL
ALT: 14 U/L (ref 0–44)
AST: 26 U/L (ref 15–41)
Albumin: 2.9 g/dL — ABNORMAL LOW (ref 3.5–5.0)
Alkaline Phosphatase: 138 U/L — ABNORMAL HIGH (ref 38–126)
Anion gap: 12 (ref 5–15)
BUN: 8 mg/dL (ref 6–20)
CO2: 28 mmol/L (ref 22–32)
Calcium: 8 mg/dL — ABNORMAL LOW (ref 8.9–10.3)
Chloride: 98 mmol/L (ref 98–111)
Creatinine, Ser: 3.5 mg/dL — ABNORMAL HIGH (ref 0.44–1.00)
GFR, Estimated: 14 mL/min — ABNORMAL LOW (ref >60)
Glucose, Bld: 148 mg/dL — ABNORMAL HIGH (ref 70–99)
Potassium: 3 mmol/L — ABNORMAL LOW (ref 3.5–5.1)
Sodium: 138 mmol/L (ref 135–145)
Total Bilirubin: 0.8 mg/dL (ref 0.3–1.2)
Total Protein: 6.3 g/dL — ABNORMAL LOW (ref 6.5–8.1)

## 2021-10-17 LAB — CBC WITH DIFFERENTIAL/PLATELET
Abs Immature Granulocytes: 0.08 10*3/uL — ABNORMAL HIGH (ref 0.00–0.07)
Basophils Absolute: 0.1 10*3/uL (ref 0.0–0.1)
Basophils Relative: 1 %
Eosinophils Absolute: 0.2 10*3/uL (ref 0.0–0.5)
Eosinophils Relative: 2 %
HCT: 36 % (ref 36.0–46.0)
Hemoglobin: 11.1 g/dL — ABNORMAL LOW (ref 12.0–15.0)
Immature Granulocytes: 1 %
Lymphocytes Relative: 11 %
Lymphs Abs: 1 10*3/uL (ref 0.7–4.0)
MCH: 30 pg (ref 26.0–34.0)
MCHC: 30.8 g/dL (ref 30.0–36.0)
MCV: 97.3 fL (ref 80.0–100.0)
Monocytes Absolute: 0.4 10*3/uL (ref 0.1–1.0)
Monocytes Relative: 4 %
Neutro Abs: 7.6 10*3/uL (ref 1.7–7.7)
Neutrophils Relative %: 81 %
Platelets: 195 10*3/uL (ref 150–400)
RBC: 3.7 MIL/uL — ABNORMAL LOW (ref 3.87–5.11)
RDW: 15.8 % — ABNORMAL HIGH (ref 11.5–15.5)
WBC: 9.3 10*3/uL (ref 4.0–10.5)
nRBC: 0 % (ref 0.0–0.2)

## 2021-10-17 LAB — GASTROINTESTINAL PANEL BY PCR, STOOL (REPLACES STOOL CULTURE)

## 2021-10-17 LAB — CLOSTRIDIUM DIFFICILE BY PCR, REFLEXED: Toxigenic C. Difficile by PCR: POSITIVE — AB

## 2021-10-17 LAB — LIPASE, BLOOD: Lipase: 26 U/L (ref 11–51)

## 2021-10-17 MED ORDER — VANCOMYCIN HCL 125 MG PO CAPS
125.0000 mg | ORAL_CAPSULE | Freq: Once | ORAL | Status: AC
  Administered 2021-10-17: 125 mg via ORAL
  Filled 2021-10-17: qty 1

## 2021-10-17 MED ORDER — VANCOMYCIN HCL 125 MG PO CAPS: 125.0000 mg | ORAL_CAPSULE | Freq: Four times a day (QID) | ORAL | 0 refills | Status: AC

## 2021-10-17 NOTE — ED Notes (Signed)
Patient called out states she has had another BM , large amt. Of stool semi-formed. Cleaned patient

## 2021-10-17 NOTE — ED Provider Notes (Signed)
[facility] EMERGENCY DEPARTMENT Provider Note   CSN: [id number] Arrival date & time: 10/17/21  0214     History  Chief Complaint  Patient presents with   Abdominal Pain    Jessica Mercado is a 61 y.o. female.  Multiple medical problems to include ESRD on dialysis, left leg fracture s/p repair here with an episode of near syncope associated with emesis and diarrhea. Apparently had been having diarrhea for a few days now and has had nausea since starting dialysis. Called EMS today and she was apparently hypotensive, cool, clammy without a palpable pulse. Received 1000 mL bolus and improved BP and mental status.   Abdominal Pain     Home Medications Prior to Admission medications   Medication Sig Start Date End Date Taking? Authorizing Provider  vancomycin (VANCOCIN) 125 MG capsule Take 1 capsule (125 mg total) by mouth 4 (four) times daily for 10 days. 10/17/21 10/27/21 Yes Dee Maday, Corene Cornea, MD  acetaminophen (TYLENOL) 500 MG tablet Take 1 tablet (500 mg total) by mouth 2 (two) times daily as needed. 10/09/21   Love, Ivan Anchors, PA-C  atorvastatin (LIPITOR) 40 MG tablet Take 40 mg by mouth daily. 12/21/17   [provider]  benztropine (COGENTIN) 1 MG tablet Take 1 mg by mouth every evening. 09/10/21   [provider]  docusate sodium (COLACE) 100 MG capsule Take 1 capsule (100 mg total) by mouth daily. 10/10/21   Love, Ivan Anchors, PA-C  FLUoxetine (PROZAC) 20 MG capsule Take 60 mg by mouth every morning. 08/09/20   [provider]  gabapentin (NEURONTIN) 100 MG capsule Take 1 capsule (100 mg total) by mouth daily. 10/09/21   Love, Ivan Anchors, PA-C  LATUDA 40 MG TABS tablet Take 40 mg by mouth at bedtime. 03/27/20   [provider]  metoprolol succinate (TOPROL-XL) 50 MG 24 hr tablet Take 1 tablet (50 mg total) by mouth daily. 10/09/21   Love, Ivan Anchors, PA-C  multivitamin (RENA-VIT) TABS tablet Take 1 tablet by mouth at bedtime. 09/20/21   Erskine Emery, MD  prazosin (MINIPRESS) 1 MG capsule Take 1 capsule (1 mg total) by mouth every evening. 10/09/21   Love, Ivan Anchors, PA-C  Semaglutide, 1 MG/DOSE, 4 MG/3ML SOPN Inject 1 mg into the skin every Monday.    [provider]  sorbitol 70 % SOLN Take 30 mLs by mouth daily as needed for moderate constipation. 10/09/21   Love, Ivan Anchors, PA-C  traZODone (DESYREL) 100 MG tablet Take 300 mg by mouth at bedtime.  12/21/17   [provider]      Allergies    Vancomycin    Review of Systems   Review of Systems  Gastrointestinal:  Positive for abdominal pain.   Physical Exam Updated Vital Signs BP 107/68 (BP Location: Left Arm)   Pulse 81   Temp 98.3 F (36.8 C) (Oral)   Resp 14   LMP  (LMP Unknown)   SpO2 98%  Physical Exam Vitals and nursing note reviewed.  Constitutional:      Appearance: She is well-developed.  HENT:     Head: Normocephalic and atraumatic.  Cardiovascular:     Rate and Rhythm: Normal rate and regular rhythm.  Pulmonary:     Effort: No respiratory distress.     Breath sounds: No stridor.  Abdominal:     General: There is no distension.  Musculoskeletal:     Cervical back: Normal range of motion.     Comments: Cast  on left leg  Skin:    General: Skin is warm and dry.  Neurological:     Mental Status: She is alert.    ED Results / Procedures / Treatments   Labs (all labs ordered are listed, but only abnormal results are displayed) Labs Reviewed  C DIFFICILE QUICK SCREEN W PCR REFLEX   - Abnormal; Notable for the following components:      Result Value   C Diff antigen POSITIVE (*)    All other components within normal limits  CLOSTRIDIUM DIFFICILE BY PCR, REFLEXED - Abnormal; Notable for the following components:   Toxigenic C. Difficile by PCR POSITIVE (*)    All other components within normal limits  CBC WITH DIFFERENTIAL/PLATELET - Abnormal; Notable for the following components:   RBC 3.70 (*)    Hemoglobin 11.1 (*)    RDW 15.8  (*)    Abs Immature Granulocytes 0.08 (*)    All other components within normal limits  COMPREHENSIVE METABOLIC PANEL - Abnormal; Notable for the following components:   Potassium 3.0 (*)    Glucose, Bld 148 (*)    Creatinine, Ser 3.50 (*)    Calcium 8.0 (*)    Total Protein 6.3 (*)    Albumin 2.9 (*)    Alkaline Phosphatase 138 (*)    GFR, Estimated 14 (*)    All other components within normal limits  GASTROINTESTINAL PANEL BY PCR, STOOL (REPLACES STOOL CULTURE)  LIPASE, BLOOD    EKG EKG Interpretation  Date/Time:  Thursday Oct 17 2021 02:53:27 EDT Ventricular Rate:  78 PR Interval:  161 QRS Duration: 98 QT Interval:  469 QTC Calculation: 535 R Axis:   20 Text Interpretation: Sinus rhythm Anteroseptal infarct, old Borderline T abnormalities, inferior leads Prolonged QT interval Confirmed by Merrily Pew [id number]) on 10/17/2021 5:12:06 AM  Radiology CT Renal Stone Study  Result Date: 10/17/2021 CLINICAL DATA:  Flank pain with kidney stone suspected. Found with presumed hypotension EXAM: CT ABDOMEN AND PELVIS WITHOUT CONTRAST TECHNIQUE: Multidetector CT imaging of the abdomen and pelvis was performed following the standard protocol without IV contrast. RADIATION DOSE REDUCTION: This exam was performed according to the departmental dose-optimization program which includes automated exposure control, adjustment of the mA and/or kV according to patient size and/or use of iterative reconstruction technique. COMPARISON:  09/08/2021 FINDINGS: Lower chest: Bands of opacity at the lower lobes which are stable and attributed to scarring. Coronary atherosclerosis. Partially covered right atrial catheter in this patient with history of end-stage renal disease Hepatobiliary: No focal liver abnormality.No evidence of biliary obstruction or stone. Pancreas: Unremarkable. Spleen: Unremarkable. Adrenals/Urinary Tract: Negative adrenals. No hydronephrosis or stone. Renal atrophy, asymmetrically worse on  the right at the lower pole, stable. Gas in the urinary bladder, also seen on prior. Stomach/Bowel: Confluent stool in the rectosigmoid colon. No bowel obstruction or visible inflammation. Small sliding hiatal hernia. Vascular/Lymphatic: No acute vascular abnormality. No mass or adenopathy. Reproductive:No pathologic findings. Other: No ascites or pneumoperitoneum. Musculoskeletal: No acute finding. Chronic endplate irregularity at L4-5 and degenerative left foraminal impingement at L5-S1. IMPRESSION: 1. Stable from CT last month. 2. Gas in the urinary bladder, please correlate with urinalysis. 3. Rectosigmoid stool distension. Electronically Signed   By: Jorje Guild M.D.   On: 10/17/2021 04:58    Procedures Procedures    Medications Ordered in ED Medications  vancomycin (VANCOCIN) capsule 125 mg (125 mg Oral Given 10/17/21 0534)    ED Course/ Medical Decision Making/ A&P  Medical Decision Making Amount and/or Complexity of Data Reviewed Labs: ordered. Radiology: ordered. ECG/medicine tests: ordered.  Risk Prescription drug management.   Vagal? Dehydration with new dialysis and this GI illness? Will eval for same.  Overall reassuring labs. Appears well. Stable vitals. C diff positive but not severe. Discussed with pharmacy. Will use 125 QID x 10 days of 125 vancomycin.    Final Clinical Impression(s) / ED Diagnoses Final diagnoses:  C. difficile diarrhea    Rx / DC Orders ED Discharge Orders          Ordered    vancomycin (VANCOCIN) 125 MG capsule  4 times daily        10/17/21 0526              [place] Langstaff, Corene Cornea, MD 10/17/21 [phone]

## 2021-10-17 NOTE — ED Notes (Signed)
Large formed bowel movement , patient was cleaned .

## 2021-10-17 NOTE — ED Notes (Signed)
[facility] here to transport home. Patient was incont of stool and cleaned patient . Patient states son-in-law is home and will let her in.

## 2021-10-17 NOTE — ED Triage Notes (Signed)
Pt via [facility] from home after she was found on bedside commode pale, cool, diaphoretic, unable to get BP, radial pulse. MWF dialysis, full tx Wednesday. After dialysis, pt endorsed periumbilical/epigastric abd pain. EMS noted sluggish, lethargy but pt A&O4.  HR 80's CBG 181 82/52 after 551mL, 110/60 after 1000 4mg  zofran, 1 episode emesis  18g R hand

## 2021-10-17 NOTE — ED Notes (Signed)
[facility] called for transport home. 

## 2021-10-18 ENCOUNTER — Encounter: Payer: Medicaid Other | Admitting: Vascular Surgery

## 2021-10-24 ENCOUNTER — Encounter: Payer: Medicaid Other | Attending: Registered Nurse | Admitting: Registered Nurse

## 2021-11-06 ENCOUNTER — Encounter (HOSPITAL_COMMUNITY): Payer: Medicaid Other

## 2021-11-06 ENCOUNTER — Encounter: Payer: Medicaid Other | Admitting: Vascular Surgery

## 2021-11-15 ENCOUNTER — Other Ambulatory Visit: Payer: Self-pay | Admitting: *Deleted

## 2021-11-15 DIAGNOSIS — N186 End stage renal disease: Secondary | ICD-10-CM

## 2021-11-28 ENCOUNTER — Encounter (HOSPITAL_COMMUNITY): Payer: Medicaid Other

## 2021-12-16 DIAGNOSIS — N2581 Secondary hyperparathyroidism of renal origin: Secondary | ICD-10-CM | POA: Insufficient documentation

## 2021-12-19 ENCOUNTER — Encounter: Payer: Self-pay | Admitting: Physician Assistant

## 2021-12-19 ENCOUNTER — Ambulatory Visit (HOSPITAL_COMMUNITY)
Admission: RE | Admit: 2021-12-19 | Discharge: 2021-12-19 | Disposition: A | Discharge status: Home / Self Care | Payer: Medicaid Other | Source: Ambulatory Visit | Attending: Physician Assistant | Admitting: Physician Assistant

## 2021-12-19 ENCOUNTER — Ambulatory Visit (INDEPENDENT_AMBULATORY_CARE_PROVIDER_SITE_OTHER): Payer: Medicaid Other | Admitting: Physician Assistant

## 2021-12-19 VITALS — BP 189/99 | HR 75 | Temp 97.4°F | Resp 16 | Ht 67.0 in | Wt 180.0 lb

## 2021-12-19 DIAGNOSIS — N186 End stage renal disease: Secondary | ICD-10-CM | POA: Diagnosis present

## 2021-12-19 NOTE — H&P (View-Only) (Signed)
Office Note     CC:  follow up Requesting Provider:  Vassie Moment, MD  HPI: Jessica Mercado is a 61 y.o. (1961/01/27) female who presents status post left first stage basilic vein fistula creation by Dr. Donzetta Matters on 10/03/2021.  She denies signs or symptoms of steal syndrome in her left hand.  She states the incision has completely healed.  She is dialyzing via right IJ [facility] on a Monday Wednesday Friday schedule.    Past Medical History:  Diagnosis Date   Acute kidney injury superimposed on CKD 3b(HCC) 07/20/2021   AKI (acute kidney injury) ([place]) 03/30/2020   Cataract    Mixed OU   Closed trimalleolar fracture of ankle, left, initial encounter 09/16/2021   Diabetes mellitus without complication ([place])    Diabetic retinopathy ([place])    NPDR OU   Elevated d-dimer 07/20/2021   Hyperlipemia    Hyperlipidemia 07/18/2021   Hypertension    Hypertensive retinopathy    OU   Hypertensive urgency 07/20/2021   Pneumonia 03/29/2020   Tendinopathy of left rotator cuff     Past Surgical History:  Procedure Laterality Date   ANKLE ARTHROSCOPY WITH OPEN REDUCTION INTERNAL FIXATION (ORIF)     AV FISTULA PLACEMENT Left 10/03/2021   Procedure: BRACHIOCEPAHLIC ARTERIOVENOUS FISTULA CREATION;  Surgeon: Waynetta Sandy, MD;  Location: [facility];  Service: Vascular;  Laterality: Left;   EXCHANGE OF A DIALYSIS CATHETER N/A 09/24/2021   Procedure: EXCHANGE OF A TEMPORARY DIALYSIS CATHETER TO PERMANENT DIALYSIS CATHETER;  Surgeon: Angelia Mould, MD;  Location: [facility] [place];  Service: Vascular;  Laterality: N/A;   INSERTION OF DIALYSIS CATHETER N/A 09/17/2021   Procedure: INSERTION OF TUNNELED DIALYSIS CATHETER;  Surgeon: Waynetta Sandy, MD;  Location: [facility];  Service: Vascular;  Laterality: N/A;   ORIF ANKLE FRACTURE Left 09/17/2021   Procedure: OPEN REDUCTION INTERNAL FIXATION (ORIF) ANKLE FRACTURE;  Surgeon: Altamese Jones Creek, MD;  Location: [facility];  Service: Orthopedics;  Laterality: Left;     Social History   Socioeconomic History   Marital status: Divorced    Spouse name: Not on file   Number of children: Not on file   Years of education: Not on file   Highest education level: Not on file  Occupational History   Not on file  Tobacco Use   Smoking status: Never   Smokeless tobacco: Never  Vaping Use   Vaping Use: Never used  Substance and Sexual Activity   Alcohol use: Not Currently   Drug use: Not Currently   Sexual activity: Not Currently  Other Topics Concern   Not on file  Social History Narrative   Not on file   Social Determinants of Health   Financial Resource Strain: Not on file  Food Insecurity: Not on file  Transportation Needs: Not on file  Physical Activity: Not on file  Stress: Not on file  Social Connections: Not on file  Intimate Partner Violence: Not on file    Family History  Problem Relation Age of Onset   Diabetes Mother    Diabetes Father    Diabetes Sister     Current Outpatient Medications  Medication Sig Dispense Refill   acetaminophen (TYLENOL) 500 MG tablet Take 1 tablet (500 mg total) by mouth 2 (two) times daily as needed. 10 tablet 0   atorvastatin (LIPITOR) 40 MG tablet Take 40 mg by mouth daily.  3   benztropine (COGENTIN) 1 MG tablet Take 1 mg by mouth every evening.  docusate sodium (COLACE) 100 MG capsule Take 1 capsule (100 mg total) by mouth daily. 30 capsule 0   FLUoxetine (PROZAC) 20 MG capsule Take 60 mg by mouth every morning.     gabapentin (NEURONTIN) 100 MG capsule Take 1 capsule (100 mg total) by mouth daily. 30 capsule 0   LATUDA 40 MG TABS tablet Take 40 mg by mouth at bedtime.     metoprolol succinate (TOPROL-XL) 50 MG 24 hr tablet Take 1 tablet (50 mg total) by mouth daily. 30 tablet 0   multivitamin (RENA-VIT) TABS tablet Take 1 tablet by mouth at bedtime. 30 tablet 0   prazosin (MINIPRESS) 1 MG capsule Take 1 capsule (1 mg total) by mouth every evening. 30 capsule 0   Semaglutide, 1 MG/DOSE, 4  MG/3ML SOPN Inject 1 mg into the skin every Monday.     sorbitol 70 % SOLN Take 30 mLs by mouth daily as needed for moderate constipation. 473 mL 0   traZODone (DESYREL) 100 MG tablet Take 300 mg by mouth at bedtime.   3   No current facility-administered medications for this visit.    Allergies  Allergen Reactions   Vancomycin Other (See Comments)    Red man syndrome     REVIEW OF SYSTEMS:   [X]  denotes positive finding, [ ]  denotes negative finding Cardiac  Comments:  Chest pain or chest pressure:    Shortness of breath upon exertion:    Short of breath when lying flat:    Irregular heart rhythm:        Vascular    Pain in calf, thigh, or hip brought on by ambulation:    Pain in feet at night that wakes you up from your sleep:     Blood clot in your veins:    Leg swelling:         Pulmonary    Oxygen at home:    Productive cough:     Wheezing:         Neurologic    Sudden weakness in arms or legs:     Sudden numbness in arms or legs:     Sudden onset of difficulty speaking or slurred speech:    Temporary loss of vision in one eye:     Problems with dizziness:         Gastrointestinal    Blood in stool:     Vomited blood:         Genitourinary    Burning when urinating:     Blood in urine:        Psychiatric    Major depression:         Hematologic    Bleeding problems:    Problems with blood clotting too easily:        Skin    Rashes or ulcers:        Constitutional    Fever or chills:      PHYSICAL EXAMINATION:  Vitals:   12/19/21 0908  BP: (!) 189/99  Pulse: 75  Resp: 16  Temp: (!) 97.4 F (36.3 C)  TempSrc: Temporal  SpO2: 100%  Weight: 180 lb (81.6 kg)  Height: 5\' 7"  (1.702 m)    General:  WDWN in NAD; vital signs documented above Gait: Not observed HENT: WNL, normocephalic Pulmonary: normal non-labored breathing , without Rales, rhonchi,  wheezing Cardiac: regular HR Abdomen: soft, NT, no masses Skin: without rashes Vascular  Exam/Pulses:  Right Left  Radial 2+ (normal) 1+ (weak)  Extremities: without ischemic changes, without Gangrene , without cellulitis; without open wounds; palpable fistula thrill near the anastomosis in the left arm Musculoskeletal: no muscle wasting or atrophy  Neurologic: A&O X 3;  No focal weakness or paresthesias are detected Psychiatric:  The pt has Normal affect.   Non-Invasive Vascular Imaging:   Patent left brachiobasilic fistula greater than 6 mm in diameter and greater than 6 mm in depth    ASSESSMENT/PLAN:: 61 y.o. female status post left arm for stage basilic vein fistula creation  -Left basilic vein fistula is patent-patient is without any signs or symptoms of steal syndrome in her left hand -Based on physical exam and duplex, fistula is ready for second stage transposition.  This surgery will be scheduled in the near future with Dr. Donzetta Matters.  The procedure was discussed in detail with the patient and all questions were answered.  She agrees to proceed.  It should be noted that she required general anesthesia for her first stage because she vomited intraoperatively.   Dagoberto Ligas, PA-C [facility] [phone]  Clinic MD:   Scot Dock

## 2021-12-19 NOTE — Progress Notes (Signed)
Office Note     CC:  follow up Requesting Provider:  Vassie Moment, MD  HPI: Jessica Mercado is a 61 y.o. (June 30, 1960) female who presents status post left first stage basilic vein fistula creation by Dr. Donzetta Matters on 10/03/2021.  She denies signs or symptoms of steal syndrome in her left hand.  She states the incision has completely healed.  She is dialyzing via right IJ [facility] on a Monday Wednesday Friday schedule.    Past Medical History:  Diagnosis Date   Acute kidney injury superimposed on CKD 3b(HCC) 07/20/2021   AKI (acute kidney injury) ([place]) 03/30/2020   Cataract    Mixed OU   Closed trimalleolar fracture of ankle, left, initial encounter 09/16/2021   Diabetes mellitus without complication ([place])    Diabetic retinopathy ([place])    NPDR OU   Elevated d-dimer 07/20/2021   Hyperlipemia    Hyperlipidemia 07/18/2021   Hypertension    Hypertensive retinopathy    OU   Hypertensive urgency 07/20/2021   Pneumonia 03/29/2020   Tendinopathy of left rotator cuff     Past Surgical History:  Procedure Laterality Date   ANKLE ARTHROSCOPY WITH OPEN REDUCTION INTERNAL FIXATION (ORIF)     AV FISTULA PLACEMENT Left 10/03/2021   Procedure: BRACHIOCEPAHLIC ARTERIOVENOUS FISTULA CREATION;  Surgeon: Waynetta Sandy, MD;  Location: [facility];  Service: Vascular;  Laterality: Left;   EXCHANGE OF A DIALYSIS CATHETER N/A 09/24/2021   Procedure: EXCHANGE OF A TEMPORARY DIALYSIS CATHETER TO PERMANENT DIALYSIS CATHETER;  Surgeon: Angelia Mould, MD;  Location: [place];  Service: Vascular;  Laterality: N/A;   INSERTION OF DIALYSIS CATHETER N/A 09/17/2021   Procedure: INSERTION OF TUNNELED DIALYSIS CATHETER;  Surgeon: Waynetta Sandy, MD;  Location: [place];  Service: Vascular;  Laterality: N/A;   ORIF ANKLE FRACTURE Left 09/17/2021   Procedure: OPEN REDUCTION INTERNAL FIXATION (ORIF) ANKLE FRACTURE;  Surgeon: Altamese Toftrees, MD;  Location: [facility];  Service: Orthopedics;  Laterality: Left;     Social History   Socioeconomic History   Marital status: Divorced    Spouse name: Not on file   Number of children: Not on file   Years of education: Not on file   Highest education level: Not on file  Occupational History   Not on file  Tobacco Use   Smoking status: Never   Smokeless tobacco: Never  Vaping Use   Vaping Use: Never used  Substance and Sexual Activity   Alcohol use: Not Currently   Drug use: Not Currently   Sexual activity: Not Currently  Other Topics Concern   Not on file  Social History Narrative   Not on file   Social Determinants of Health   Financial Resource Strain: Not on file  Food Insecurity: Not on file  Transportation Needs: Not on file  Physical Activity: Not on file  Stress: Not on file  Social Connections: Not on file  Intimate Partner Violence: Not on file    Family History  Problem Relation Age of Onset   Diabetes Mother    Diabetes Father    Diabetes Sister     Current Outpatient Medications  Medication Sig Dispense Refill   acetaminophen (TYLENOL) 500 MG tablet Take 1 tablet (500 mg total) by mouth 2 (two) times daily as needed. 10 tablet 0   atorvastatin (LIPITOR) 40 MG tablet Take 40 mg by mouth daily.  3   benztropine (COGENTIN) 1 MG tablet Take 1 mg by mouth every evening.  docusate sodium (COLACE) 100 MG capsule Take 1 capsule (100 mg total) by mouth daily. 30 capsule 0   FLUoxetine (PROZAC) 20 MG capsule Take 60 mg by mouth every morning.     gabapentin (NEURONTIN) 100 MG capsule Take 1 capsule (100 mg total) by mouth daily. 30 capsule 0   LATUDA 40 MG TABS tablet Take 40 mg by mouth at bedtime.     metoprolol succinate (TOPROL-XL) 50 MG 24 hr tablet Take 1 tablet (50 mg total) by mouth daily. 30 tablet 0   multivitamin (RENA-VIT) TABS tablet Take 1 tablet by mouth at bedtime. 30 tablet 0   prazosin (MINIPRESS) 1 MG capsule Take 1 capsule (1 mg total) by mouth every evening. 30 capsule 0   Semaglutide, 1 MG/DOSE, 4  MG/3ML SOPN Inject 1 mg into the skin every Monday.     sorbitol 70 % SOLN Take 30 mLs by mouth daily as needed for moderate constipation. 473 mL 0   traZODone (DESYREL) 100 MG tablet Take 300 mg by mouth at bedtime.   3   No current facility-administered medications for this visit.    Allergies  Allergen Reactions   Vancomycin Other (See Comments)    Red man syndrome     REVIEW OF SYSTEMS:   [X]  denotes positive finding, [ ]  denotes negative finding Cardiac  Comments:  Chest pain or chest pressure:    Shortness of breath upon exertion:    Short of breath when lying flat:    Irregular heart rhythm:        Vascular    Pain in calf, thigh, or hip brought on by ambulation:    Pain in feet at night that wakes you up from your sleep:     Blood clot in your veins:    Leg swelling:         Pulmonary    Oxygen at home:    Productive cough:     Wheezing:         Neurologic    Sudden weakness in arms or legs:     Sudden numbness in arms or legs:     Sudden onset of difficulty speaking or slurred speech:    Temporary loss of vision in one eye:     Problems with dizziness:         Gastrointestinal    Blood in stool:     Vomited blood:         Genitourinary    Burning when urinating:     Blood in urine:        Psychiatric    Major depression:         Hematologic    Bleeding problems:    Problems with blood clotting too easily:        Skin    Rashes or ulcers:        Constitutional    Fever or chills:      PHYSICAL EXAMINATION:  Vitals:   12/19/21 0908  BP: (!) 189/99  Pulse: 75  Resp: 16  Temp: (!) 97.4 F (36.3 C)  TempSrc: Temporal  SpO2: 100%  Weight: 180 lb (81.6 kg)  Height: 5\' 7"  (1.702 m)    General:  WDWN in NAD; vital signs documented above Gait: Not observed HENT: WNL, normocephalic Pulmonary: normal non-labored breathing , without Rales, rhonchi,  wheezing Cardiac: regular HR Abdomen: soft, NT, no masses Skin: without rashes Vascular  Exam/Pulses:  Right Left  Radial 2+ (normal) 1+ (weak)  Extremities: without ischemic changes, without Gangrene , without cellulitis; without open wounds; palpable fistula thrill near the anastomosis in the left arm Musculoskeletal: no muscle wasting or atrophy  Neurologic: A&O X 3;  No focal weakness or paresthesias are detected Psychiatric:  The pt has Normal affect.   Non-Invasive Vascular Imaging:   Patent left brachiobasilic fistula greater than 6 mm in diameter and greater than 6 mm in depth    ASSESSMENT/PLAN:: 61 y.o. female status post left arm for stage basilic vein fistula creation  -Left basilic vein fistula is patent-patient is without any signs or symptoms of steal syndrome in her left hand -Based on physical exam and duplex, fistula is ready for second stage transposition.  This surgery will be scheduled in the near future with Dr. Donzetta Matters.  The procedure was discussed in detail with the patient and all questions were answered.  She agrees to proceed.  It should be noted that she required general anesthesia for her first stage because she vomited intraoperatively.   Dagoberto Ligas, PA-C [facility] [phone]  Clinic MD:   Scot Dock

## 2021-12-24 DIAGNOSIS — M47812 Spondylosis without myelopathy or radiculopathy, cervical region: Secondary | ICD-10-CM | POA: Insufficient documentation

## 2021-12-24 DIAGNOSIS — M961 Postlaminectomy syndrome, not elsewhere classified: Secondary | ICD-10-CM | POA: Insufficient documentation

## 2021-12-24 DIAGNOSIS — M542 Cervicalgia: Secondary | ICD-10-CM | POA: Insufficient documentation

## 2022-01-07 ENCOUNTER — Other Ambulatory Visit: Payer: Self-pay

## 2022-01-07 DIAGNOSIS — N186 End stage renal disease: Secondary | ICD-10-CM

## 2022-01-13 ENCOUNTER — Encounter (HOSPITAL_COMMUNITY): Payer: Self-pay | Admitting: Vascular Surgery

## 2022-01-13 ENCOUNTER — Other Ambulatory Visit: Payer: Self-pay

## 2022-01-13 NOTE — Anesthesia Preprocedure Evaluation (Signed)
Anesthesia Evaluation  Patient identified by MRN, date of birth, ID band Patient awake    Reviewed: Allergy & Precautions, NPO status , Patient's Chart, lab work & pertinent test results, reviewed documented beta blocker date and time   History of Anesthesia Complications (+) PONV and history of anesthetic complications  Airway Mallampati: II  TM Distance: >3 FB Neck ROM: Full    Dental  (+) Dental Advisory Given, Edentulous Upper, Edentulous Lower   Pulmonary sleep apnea ,    Pulmonary exam normal breath sounds clear to auscultation       Cardiovascular hypertension, Pt. on home beta blockers +CHF  Normal cardiovascular exam Rhythm:Regular Rate:Normal     Neuro/Psych PSYCHIATRIC DISORDERS Bipolar Disorder  Neuromuscular disease    GI/Hepatic negative GI ROS, Neg liver ROS,   Endo/Other  diabetes, Type 2, Oral Hypoglycemic Agents  Renal/GU ESRF and DialysisRenal disease (K+ 3.7)     Musculoskeletal negative musculoskeletal ROS (+)   Abdominal   Peds  Hematology negative hematology ROS (+)   Anesthesia Other Findings Day of surgery medications reviewed with the patient.  Reproductive/Obstetrics                            Anesthesia Physical Anesthesia Plan  ASA: 3  Anesthesia Plan: Regional and MAC   Post-op Pain Management: Tylenol PO (pre-op)* and Regional block*   Induction: Intravenous  PONV Risk Score and Plan: 3 and Midazolam, Dexamethasone, Ondansetron and TIVA  Airway Management Planned: Natural Airway and Nasal Cannula  Additional Equipment:   Intra-op Plan:   Post-operative Plan:   Informed Consent: I have reviewed the patients History and Physical, chart, labs and discussed the procedure including the risks, benefits and alternatives for the proposed anesthesia with the patient or authorized representative who has indicated his/her understanding and acceptance.      Dental advisory given  Plan Discussed with: CRNA  Anesthesia Plan Comments:        Anesthesia Quick Evaluation

## 2022-01-13 NOTE — Progress Notes (Signed)
Spoke with patient's son in law [name]  PCP - Dr. Delphia Grates with [facility] in [place] (Will be seeing someone new soon) Cardiologist - Has seen in the past per patient's son in law [name]  PPM/ICD - Denies  Chest x-ray - NI EKG - 10/17/21 Stress Test - No ECHO - 07/19/21 Cardiac Cath - Denies  Sleep Study - Denies had study in past in [place] showing she had sleep apnea CPAP - No  DM - II CBG usually runs 150 after eating Checks Blood Sugar ___daily   Anesthesia review: No  Patient denies shortness of breath, fever, cough and chest pain at PAT appointment   All instructions explained to the patient, with a verbal understanding of the material. Patient agrees to go over the instructions while at home for a better understanding. The opportunity to ask questions was provided.

## 2022-01-14 ENCOUNTER — Other Ambulatory Visit: Payer: Self-pay

## 2022-01-14 ENCOUNTER — Ambulatory Visit (HOSPITAL_COMMUNITY): Payer: Medicaid Other | Admitting: Anesthesiology

## 2022-01-14 ENCOUNTER — Ambulatory Visit (HOSPITAL_BASED_OUTPATIENT_CLINIC_OR_DEPARTMENT_OTHER): Payer: Medicaid Other | Admitting: Anesthesiology

## 2022-01-14 ENCOUNTER — Encounter (HOSPITAL_COMMUNITY): Payer: Self-pay | Admitting: Vascular Surgery

## 2022-01-14 ENCOUNTER — Ambulatory Visit (HOSPITAL_COMMUNITY)
Admission: RE | Admit: 2022-01-14 | Discharge: 2022-01-14 | Disposition: A | Discharge status: Home / Self Care | Payer: Medicaid Other | Attending: Vascular Surgery | Admitting: Vascular Surgery

## 2022-01-14 ENCOUNTER — Encounter (HOSPITAL_COMMUNITY)
Admission: RE | Disposition: A | Discharge status: Home / Self Care | Payer: Self-pay | Source: Home / Self Care | Attending: Vascular Surgery

## 2022-01-14 DIAGNOSIS — N186 End stage renal disease: Secondary | ICD-10-CM | POA: Diagnosis not present

## 2022-01-14 DIAGNOSIS — N185 Chronic kidney disease, stage 5: Secondary | ICD-10-CM

## 2022-01-14 DIAGNOSIS — Z992 Dependence on renal dialysis: Secondary | ICD-10-CM

## 2022-01-14 DIAGNOSIS — I132 Hypertensive heart and chronic kidney disease with heart failure and with stage 5 chronic kidney disease, or end stage renal disease: Secondary | ICD-10-CM

## 2022-01-14 DIAGNOSIS — T829XXA Unspecified complication of cardiac and vascular prosthetic device, implant and graft, initial encounter: Secondary | ICD-10-CM

## 2022-01-14 DIAGNOSIS — E1122 Type 2 diabetes mellitus with diabetic chronic kidney disease: Secondary | ICD-10-CM | POA: Diagnosis not present

## 2022-01-14 DIAGNOSIS — Z7984 Long term (current) use of oral hypoglycemic drugs: Secondary | ICD-10-CM

## 2022-01-14 DIAGNOSIS — I120 Hypertensive chronic kidney disease with stage 5 chronic kidney disease or end stage renal disease: Secondary | ICD-10-CM | POA: Diagnosis present

## 2022-01-14 DIAGNOSIS — Z7985 Long-term (current) use of injectable non-insulin antidiabetic drugs: Secondary | ICD-10-CM | POA: Diagnosis not present

## 2022-01-14 DIAGNOSIS — I509 Heart failure, unspecified: Secondary | ICD-10-CM

## 2022-01-14 HISTORY — PX: BASCILIC VEIN TRANSPOSITION: SHX5742

## 2022-01-14 HISTORY — DX: Other specified postprocedural states: Z98.890

## 2022-01-14 HISTORY — DX: Family history of other specified conditions: Z84.89

## 2022-01-14 LAB — GLUCOSE, CAPILLARY
Glucose-Capillary: 142 mg/dL — ABNORMAL HIGH (ref 70–99)
Glucose-Capillary: 199 mg/dL — ABNORMAL HIGH (ref 70–99)

## 2022-01-14 LAB — POCT I-STAT, CHEM 8
BUN: 21 mg/dL — ABNORMAL HIGH (ref 6–20)
Calcium, Ion: 1.06 mmol/L — ABNORMAL LOW (ref 1.15–1.40)
Chloride: 95 mmol/L — ABNORMAL LOW (ref 98–111)
Creatinine, Ser: 3 mg/dL — ABNORMAL HIGH (ref 0.44–1.00)
Glucose, Bld: 144 mg/dL — ABNORMAL HIGH (ref 70–99)
HCT: 44 % (ref 36.0–46.0)
Hemoglobin: 15 g/dL (ref 12.0–15.0)
Potassium: 3.7 mmol/L (ref 3.5–5.1)
Sodium: 137 mmol/L (ref 135–145)
TCO2: 28 mmol/L (ref 22–32)

## 2022-01-14 SURGERY — TRANSPOSITION, VEIN, BASILIC
Anesthesia: Monitor Anesthesia Care | Site: Arm Upper | Laterality: Left

## 2022-01-14 MED ORDER — HEPARIN 6000 UNIT IRRIGATION SOLUTION
Status: AC
  Filled 2022-01-14: qty 500

## 2022-01-14 MED ORDER — LIDOCAINE 2% (20 MG/ML) 5 ML SYRINGE
INTRAMUSCULAR | Status: DC | PRN
  Administered 2022-01-14: 20 mg via INTRAVENOUS

## 2022-01-14 MED ORDER — CHLORHEXIDINE GLUCONATE 0.12 % MT SOLN
15.0000 mL | Freq: Once | OROMUCOSAL | Status: AC
  Administered 2022-01-14: 15 mL via OROMUCOSAL
  Filled 2022-01-14: qty 15

## 2022-01-14 MED ORDER — MIDAZOLAM HCL 2 MG/2ML IJ SOLN
INTRAMUSCULAR | Status: DC | PRN
  Administered 2022-01-14 (×2): 1 mg via INTRAVENOUS

## 2022-01-14 MED ORDER — PHENYLEPHRINE HCL-NACL 20-0.9 MG/250ML-% IV SOLN
INTRAVENOUS | Status: DC | PRN
  Administered 2022-01-14: 35 ug/min via INTRAVENOUS

## 2022-01-14 MED ORDER — PROPOFOL 500 MG/50ML IV EMUL
INTRAVENOUS | Status: DC | PRN
  Administered 2022-01-14: 50 ug/kg/min via INTRAVENOUS

## 2022-01-14 MED ORDER — CHLORHEXIDINE GLUCONATE 4 % EX LIQD: 60.0000 mL | Freq: Once | CUTANEOUS | Status: DC

## 2022-01-14 MED ORDER — ORAL CARE MOUTH RINSE: 15.0000 mL | Freq: Once | OROMUCOSAL | Status: AC

## 2022-01-14 MED ORDER — LIDOCAINE-EPINEPHRINE (PF) 1 %-1:200000 IJ SOLN
INTRAMUSCULAR | Status: DC | PRN
  Administered 2022-01-14: 10 mL via INTRADERMAL

## 2022-01-14 MED ORDER — DEXAMETHASONE SODIUM PHOSPHATE 10 MG/ML IJ SOLN
INTRAMUSCULAR | Status: DC | PRN
  Administered 2022-01-14: 5 mg via INTRAVENOUS

## 2022-01-14 MED ORDER — FENTANYL CITRATE (PF) 250 MCG/5ML IJ SOLN
INTRAMUSCULAR | Status: DC | PRN
  Administered 2022-01-14 (×2): 25 ug via INTRAVENOUS

## 2022-01-14 MED ORDER — FENTANYL CITRATE (PF) 250 MCG/5ML IJ SOLN
INTRAMUSCULAR | Status: AC
  Filled 2022-01-14: qty 5

## 2022-01-14 MED ORDER — PROPOFOL 10 MG/ML IV BOLUS
INTRAVENOUS | Status: AC
  Filled 2022-01-14: qty 20

## 2022-01-14 MED ORDER — ACETAMINOPHEN 500 MG PO TABS
1000.0000 mg | ORAL_TABLET | Freq: Once | ORAL | Status: AC
Start: 2022-01-14 — End: 2022-01-14
  Administered 2022-01-14: 1000 mg via ORAL
  Filled 2022-01-14: qty 2

## 2022-01-14 MED ORDER — ROPIVACAINE HCL 5 MG/ML IJ SOLN
INTRAMUSCULAR | Status: DC | PRN
  Administered 2022-01-14: 30 mL via PERINEURAL

## 2022-01-14 MED ORDER — SODIUM CHLORIDE 0.9 % IV SOLN: INTRAVENOUS | Status: DC

## 2022-01-14 MED ORDER — FENTANYL CITRATE (PF) 100 MCG/2ML IJ SOLN: 25.0000 ug | INTRAMUSCULAR | Status: DC | PRN

## 2022-01-14 MED ORDER — LIDOCAINE-EPINEPHRINE (PF) 1 %-1:200000 IJ SOLN
INTRAMUSCULAR | Status: AC
  Filled 2022-01-14: qty 30

## 2022-01-14 MED ORDER — INSULIN ASPART 100 UNIT/ML IJ SOLN: 0.0000 [IU] | INTRAMUSCULAR | Status: DC | PRN

## 2022-01-14 MED ORDER — HEPARIN 6000 UNIT IRRIGATION SOLUTION
Status: DC | PRN
  Administered 2022-01-14: 1

## 2022-01-14 MED ORDER — PHENYLEPHRINE 80 MCG/ML (10ML) SYRINGE FOR IV PUSH (FOR BLOOD PRESSURE SUPPORT)
PREFILLED_SYRINGE | INTRAVENOUS | Status: DC | PRN
  Administered 2022-01-14 (×2): 80 ug via INTRAVENOUS

## 2022-01-14 MED ORDER — HYDROCODONE-ACETAMINOPHEN 5-325 MG PO TABS
1.0000 | ORAL_TABLET | Freq: Four times a day (QID) | ORAL | 0 refills | Status: AC | PRN
Start: 2022-01-14 — End: ?

## 2022-01-14 MED ORDER — 0.9 % SODIUM CHLORIDE (POUR BTL) OPTIME
TOPICAL | Status: DC | PRN
  Administered 2022-01-14: 1000 mL

## 2022-01-14 MED ORDER — CEFAZOLIN SODIUM-DEXTROSE 2-4 GM/100ML-% IV SOLN
2.0000 g | INTRAVENOUS | Status: DC
  Filled 2022-01-14: qty 100

## 2022-01-14 MED ORDER — MIDAZOLAM HCL 2 MG/2ML IJ SOLN
INTRAMUSCULAR | Status: AC
  Filled 2022-01-14: qty 2

## 2022-01-14 MED ORDER — PAPAVERINE HCL 30 MG/ML IJ SOLN
INTRAMUSCULAR | Status: AC
  Filled 2022-01-14: qty 2

## 2022-01-14 MED ORDER — ONDANSETRON HCL 4 MG/2ML IJ SOLN: 4.0000 mg | Freq: Once | INTRAMUSCULAR | Status: DC | PRN

## 2022-01-14 MED ORDER — STERILE WATER FOR IRRIGATION IR SOLN
Status: DC | PRN
  Administered 2022-01-14: 1000 mL

## 2022-01-14 MED ORDER — ONDANSETRON HCL 4 MG/2ML IJ SOLN
INTRAMUSCULAR | Status: DC | PRN
  Administered 2022-01-14: 4 mg via INTRAVENOUS

## 2022-01-14 SURGICAL SUPPLY — 33 items
ARMBAND PINK RESTRICT EXTREMIT (MISCELLANEOUS) ×1 IMPLANT
BAG COUNTER SPONGE SURGICOUNT (BAG) ×1 IMPLANT
CLIP LIGATING EXTRA MED SLVR (CLIP) ×1 IMPLANT
CLIP LIGATING EXTRA SM BLUE (MISCELLANEOUS) ×1 IMPLANT
COVER PROBE W GEL 5X96 (DRAPES) ×1 IMPLANT
DERMABOND ADHESIVE PROPEN (GAUZE/BANDAGES/DRESSINGS) ×1
DERMABOND ADVANCED (GAUZE/BANDAGES/DRESSINGS) ×1
DERMABOND ADVANCED .7 DNX12 (GAUZE/BANDAGES/DRESSINGS) ×1 IMPLANT
DERMABOND ADVANCED .7 DNX6 (GAUZE/BANDAGES/DRESSINGS) IMPLANT
ELECT REM PT RETURN 9FT ADLT (ELECTROSURGICAL) ×1
ELECTRODE REM PT RTRN 9FT ADLT (ELECTROSURGICAL) ×1 IMPLANT
GLOVE BIO SURGEON STRL SZ7.5 (GLOVE) ×1 IMPLANT
GOWN STRL REUS W/ TWL LRG LVL3 (GOWN DISPOSABLE) ×2 IMPLANT
GOWN STRL REUS W/ TWL XL LVL3 (GOWN DISPOSABLE) ×1 IMPLANT
GOWN STRL REUS W/TWL LRG LVL3 (GOWN DISPOSABLE) ×2
GOWN STRL REUS W/TWL XL LVL3 (GOWN DISPOSABLE) ×2
KIT BASIN OR (CUSTOM PROCEDURE TRAY) ×1 IMPLANT
KIT TURNOVER KIT B (KITS) ×1 IMPLANT
NS IRRIG 1000ML POUR BTL (IV SOLUTION) ×1 IMPLANT
PACK CV ACCESS (CUSTOM PROCEDURE TRAY) ×1 IMPLANT
PAD ARMBOARD 7.5X6 YLW CONV (MISCELLANEOUS) ×2 IMPLANT
SUT MNCRL AB 4-0 PS2 18 (SUTURE) ×1 IMPLANT
SUT PROLENE 6 0 BV (SUTURE) ×1 IMPLANT
SUT SILK 2 0 SH (SUTURE) IMPLANT
SUT SILK 3 0 (SUTURE) ×1
SUT SILK 3-0 18XBRD TIE 12 (SUTURE) IMPLANT
SUT VIC AB 2-0 CT1 27 (SUTURE) ×1
SUT VIC AB 2-0 CT1 TAPERPNT 27 (SUTURE) IMPLANT
SUT VIC AB 3-0 SH 27 (SUTURE) ×1
SUT VIC AB 3-0 SH 27X BRD (SUTURE) ×1 IMPLANT
TOWEL GREEN STERILE (TOWEL DISPOSABLE) ×1 IMPLANT
UNDERPAD 30X36 HEAVY ABSORB (UNDERPADS AND DIAPERS) ×1 IMPLANT
WATER STERILE IRR 1000ML POUR (IV SOLUTION) ×1 IMPLANT

## 2022-01-14 NOTE — Anesthesia Procedure Notes (Signed)
Anesthesia Regional Block: Supraclavicular block   Pre-Anesthetic Checklist: , timeout performed,  Correct Patient, Correct Site, Correct Laterality,  Correct Procedure, Correct Position, site marked,  Risks and benefits discussed,  Surgical consent,  Pre-op evaluation,  At surgeon's request and post-op pain management  Laterality: Left  Prep: chloraprep       Needles:  Injection technique: Single-shot  Needle Type: Echogenic Needle     Needle Length: 9cm  Needle Gauge: 21     Additional Needles:   Procedures:,,,, ultrasound used (permanent image in chart),,    Narrative:  Start time: 01/14/2022 6:55 AM End time: 01/14/2022 7:01 AM Injection made incrementally with aspirations every 5 mL.  Performed by: Personally  Anesthesiologist: Santa Lighter, MD  Additional Notes: No pain on injection. No increased resistance to injection. Injection made in 5cc increments.  Good needle visualization.  Patient tolerated procedure well.

## 2022-01-14 NOTE — Discharge Instructions (Signed)
° °  [facility] ° °Discharge Instructions ° °AV Fistula or Graft Surgery for Dialysis Access ° °Please refer to the following instructions for your post-procedure care. Your surgeon or physician assistant will discuss any changes with you. ° °Activity ° °You may drive the day following your surgery, if you are comfortable and no longer taking prescription pain medication. Resume full activity as the soreness in your incision resolves. ° °Bathing/Showering ° °You may shower after you go home. Keep your incision dry for 48 hours. Do not soak in a bathtub, hot tub, or swim until the incision heals completely. You may not shower if you have a hemodialysis catheter. ° °Incision Care ° °Clean your incision with mild soap and water after 48 hours. Pat the area dry with a clean towel. You do not need a bandage unless otherwise instructed. Do not apply any ointments or creams to your incision. You may have skin glue on your incision. Do not peel it off. It will come off on its own in about one week. Your arm may swell a bit after surgery. To reduce swelling use pillows to elevate your arm so it is above your heart. Your doctor will tell you if you need to lightly wrap your arm with an ACE bandage. ° °Diet ° °Resume your normal diet. There are not special food restrictions following this procedure. In order to heal from your surgery, it is CRITICAL to get adequate nutrition. Your body requires vitamins, minerals, and protein. Vegetables are the best source of vitamins and minerals. Vegetables also provide the perfect balance of protein. Processed food has little nutritional value, so try to avoid this. ° °Medications ° °Resume taking all of your medications. If your incision is causing pain, you may take over-the counter pain relievers such as acetaminophen (Tylenol). If you were prescribed a stronger pain medication, please be aware these medications can cause nausea and constipation. Prevent  nausea by taking the medication with a snack or meal. Avoid constipation by drinking plenty of fluids and eating foods with high amount of fiber, such as fruits, vegetables, and grains. Do not take Tylenol if you are taking prescription pain medications. ° ° ° ° °Follow up °Your surgeon may want to see you in the office following your access surgery. If so, this will be arranged at the time of your surgery. ° °Please call us immediately for any of the following conditions: ° °Increased pain, redness, drainage (pus) from your incision site °Fever of 101 degrees or higher °Severe or worsening pain at your incision site °Hand pain or numbness. ° °Reduce your risk of vascular disease: ° °Stop smoking. If you would like help, call [facility] at 1-800-QUIT-NOW (1-800-784-8669) or [facility] at 336-586-4000 ° °Manage your cholesterol °Maintain a desired weight °Control your diabetes °Keep your blood pressure down ° °Dialysis ° °It will take several weeks to several months for your new dialysis access to be ready for use. Your surgeon will determine when it is OK to use it. Your nephrologist will continue to direct your dialysis. You can continue to use your Permcath until your new access is ready for use. ° °If you have any questions, please call the office at [phone]. ° °

## 2022-01-14 NOTE — Op Note (Addendum)
    Patient name: Jessica Mercado MRN: [id number] DOB: 23-Nov-1960 Sex: female  01/14/2022 Pre-operative Diagnosis: esrd Post-operative diagnosis:  Same Surgeon:  Erlene Quan C. Donzetta Matters, MD Assistant: Arlee Muslim, PA Procedure Performed:  Left upper arm basilic vein fistula revision with transposition  Indications: 61 year old female with history of end-stage renal disease on dialysis via catheter.  She has a previous left upper arm AV fistula now indicated for revision with transposition.  Findings: Fistula was large measuring about 1 cm throughout its course.  After transposition there was a very strong thrill in the fistula confirmed with Doppler and radial artery pulse also confirmed with Doppler at the wrist.   Procedure:  The patient was identified in the holding area and taken to the operating room where she was placed in the right upper table and MAC anesthesia was induced.  She was sterilely prepped and draped in the left upper extremity usual fashion, antibiotics were ministered a timeout was called.  Preoperative block of been placed this was tested and noted to be intact.  Ultrasound was used to identify the fistula throughout its course.  3 separate incisions were made in the upper arm and the fistula was dissected throughout its course and branches were divided between clips and ties.  The fascia was then marked for orientation clamped near the arteriovenous anastomosis and transected.  This was then flushed with heparinized saline tunneled laterally spatulated both ends and sewn end-to-end with 6-0 Prolene suture.  Prior completion of flushing all directions.  Upon completion we did free up some soft tissue around the fistula there was then a very strong thrill confirmed with Doppler and a 1+ palpable radial pulse at the wrist also confirmed with Doppler.  Satisfied with this we irrigated the wounds obtain pneumostasis and closed in layers with Vicryl and Monocryl.  Dermabond was placed at the  skin level.  She was then awakened from anesthesia having tolerated procedure without any complication.  All counts were correct at completion.  Given the complexity of the case,  the assistant was necessary in order to expedient the procedure and safely perform the technical aspects of the operation.  The assistant provided traction and countertraction to assist with exposure of the artery and vein.  They also assisted with suture ligation of multiple venous branches.  They played a critical role in the anastomosis. These skills, especially following the Prolene suture for the anastomosis, could not have been adequately performed by a scrub tech assistant.    EBL : 50cc   Lova Urbieta C. Donzetta Matters, MD [facility] Office: [phone] Pager: [phone]

## 2022-01-14 NOTE — Interval H&P Note (Signed)
History and Physical Interval Note:  01/14/2022 7:23 AM  Jessica Mercado  has presented today for surgery, with the diagnosis of ESRD.  The various methods of treatment have been discussed with the patient and family. After consideration of risks, benefits and other options for treatment, the patient has consented to  Procedure(s) with comments: LEFT ARM SECOND STAGE BASILIC VEIN TRANSPOSITION (Left) - PERIPHERAL NERVE BLOCK as a surgical intervention.  The patient's history has been reviewed, patient examined, no change in status, stable for surgery.  I have reviewed the patient's chart and labs.  Questions were answered to the patient's satisfaction.     Servando Snare

## 2022-01-14 NOTE — Transfer of Care (Signed)
Immediate Anesthesia Transfer of Care Note  Patient: Jessica Mercado  Procedure(s) Performed: LEFT ARM SECOND STAGE BASILIC VEIN TRANSPOSITION (Left: Arm Upper)  Patient Location: PACU  Anesthesia Type:MAC  Level of Consciousness: awake, alert  and oriented  Airway & Oxygen Therapy: Patient connected to nasal cannula oxygen  Post-op Assessment: Post -op Vital signs reviewed and stable  Post vital signs: stable  Last Vitals:  Vitals Value Taken Time  BP    Temp    Pulse 80 01/14/22 0858  Resp    SpO2 93 % 01/14/22 0858  Vitals shown include unvalidated device data.  Last Pain:  Vitals:   01/14/22 0626  TempSrc: Oral  PainSc:          Complications: No notable events documented.

## 2022-01-14 NOTE — Anesthesia Procedure Notes (Signed)
Procedure Name: MAC Date/Time: 01/14/2022 8:01 AM  Performed by: Lavell Luster, CRNAPre-anesthesia Checklist: Patient identified, Emergency Drugs available, Suction available, Patient being monitored and Timeout performed Patient Re-evaluated:Patient Re-evaluated prior to induction Oxygen Delivery Method: Nasal cannula Preoxygenation: Pre-oxygenation with 100% oxygen Induction Type: IV induction Placement Confirmation: breath sounds checked- equal and bilateral and positive ETCO2 Dental Injury: Teeth and Oropharynx as per pre-operative assessment

## 2022-01-14 NOTE — Anesthesia Postprocedure Evaluation (Signed)
Anesthesia Post Note  Patient: Jessica Mercado  Procedure(s) Performed: LEFT ARM SECOND STAGE BASILIC VEIN TRANSPOSITION (Left: Arm Upper)     Patient location during evaluation: PACU Anesthesia Type: Regional Level of consciousness: awake and alert Pain management: pain level controlled Vital Signs Assessment: post-procedure vital signs reviewed and stable Respiratory status: spontaneous breathing, nonlabored ventilation, respiratory function stable and patient connected to nasal cannula oxygen Cardiovascular status: stable and blood pressure returned to baseline Postop Assessment: no apparent nausea or vomiting Anesthetic complications: no   No notable events documented.  Last Vitals:  Vitals:   01/14/22 0900 01/14/22 0915  BP: (!) 168/86 (!) 180/95  Pulse: 80 79  Resp: (!) 9 15  Temp: 36.4 C 36.4 C  SpO2: 93% 93%    Last Pain:  Vitals:   01/14/22 0915  TempSrc:   PainSc: 0-No pain                 Santa Lighter

## 2022-01-15 ENCOUNTER — Telehealth: Payer: Self-pay | Admitting: Physician Assistant

## 2022-01-15 ENCOUNTER — Encounter (HOSPITAL_COMMUNITY): Payer: Self-pay | Admitting: Vascular Surgery

## 2022-01-15 NOTE — Telephone Encounter (Signed)
-----   Message from Dagoberto Ligas, PA-C sent at 01/14/2022  8:53 AM EDT -----  Incision check in 2-3 weeks for PA.  PO L arm 2nd stage basilic Thanks

## 2022-01-21 NOTE — Telephone Encounter (Signed)
Appt has been scheduled.

## 2022-01-22 ENCOUNTER — Telehealth: Payer: Self-pay

## 2022-01-22 NOTE — Telephone Encounter (Signed)
I spoke with Jessica Mercado, he states she will not be able to come into the office any this week because they have to set up transportation 3 days prior in order to get here. They are flying out on Monday at [place] to move to [place]. She is currently on HD. She goes to the [facility] located on [address].  M-W-F but he has not called to find a Dialysis Center in [place] or to est care in [place]. I then spoke with Jessica Mercado at [facility]). I asked if they are able to look at her incision while she is there getting her treatment. Jessica Mercado stated they are able to at least look at the incision and notify our office of what the examine. I notified my office manager(Jessica Mercado) of the conversations that took place with [name] and Jessica Mercado.

## 2023-07-28 IMAGING — US US RENAL
1 series · 14 of 25 positions shown · non-contrast
Comparison: 03/31/2020

CLINICAL DATA: Renal dysfunction

EXAM:
RENAL / URINARY TRACT ULTRASOUND COMPLETE

[Series 1: us renal · 14 of 45 slices shown]
[im 1/45]
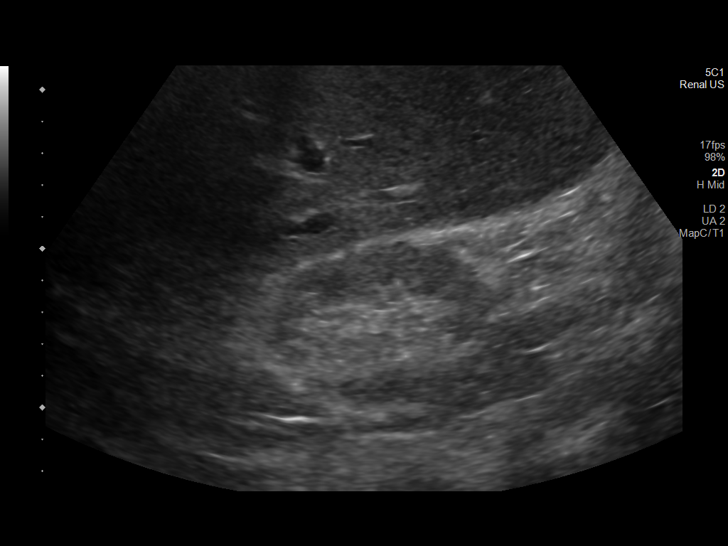
[im 4/45]
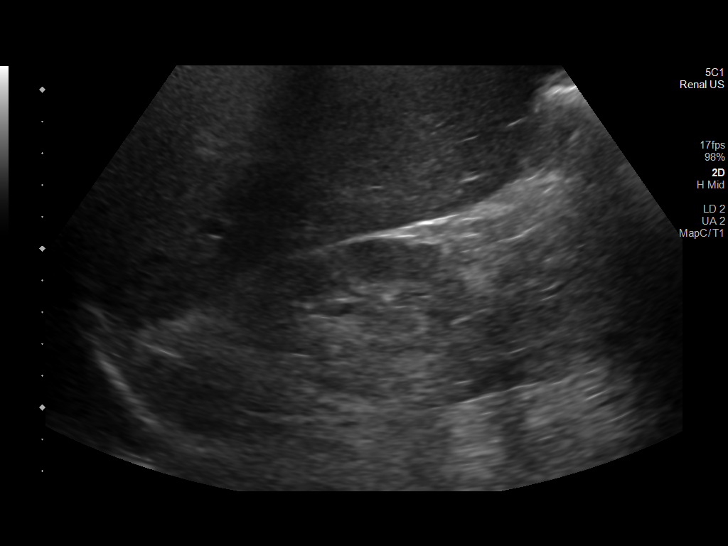
[im 8/45]
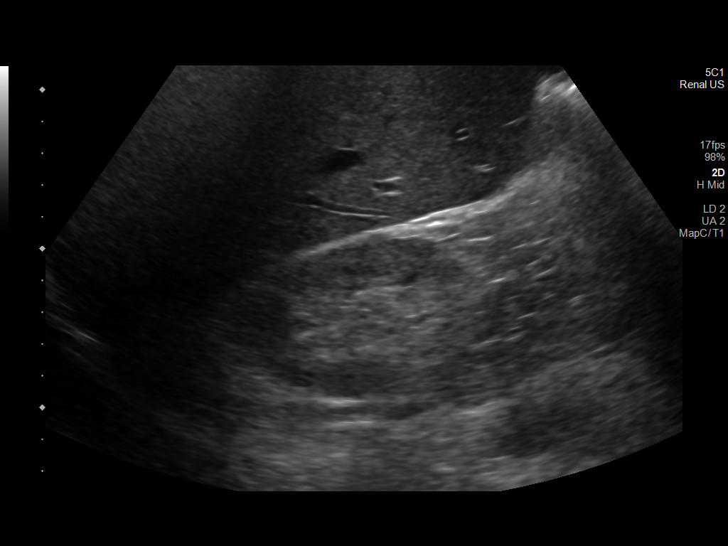
[im 12/45]
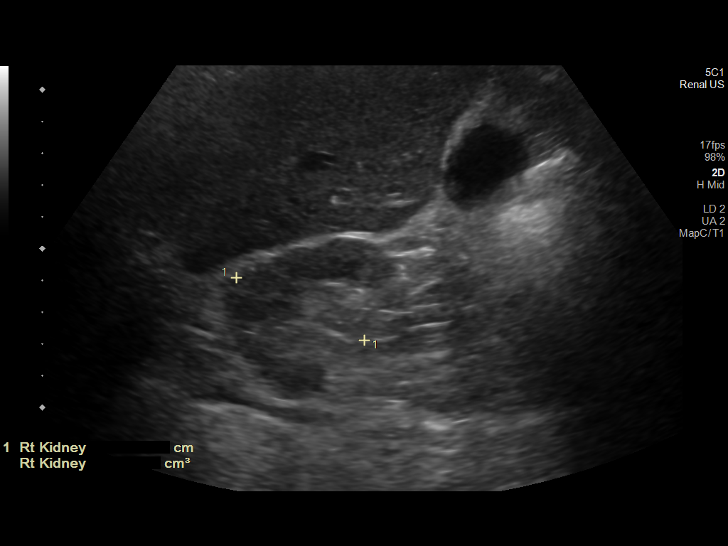
[im 15/45]
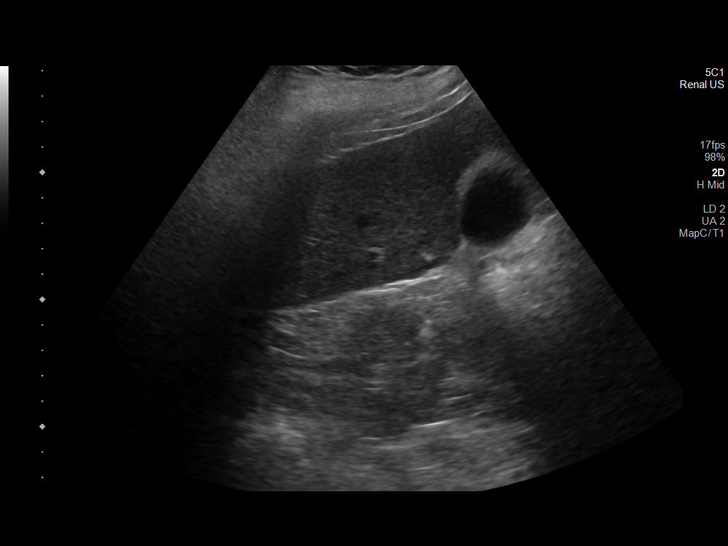
[im 17/45]
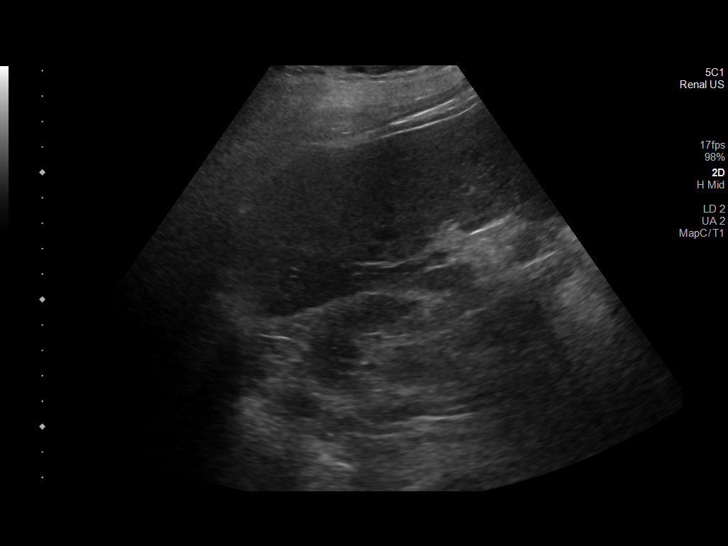
[im 21/45]
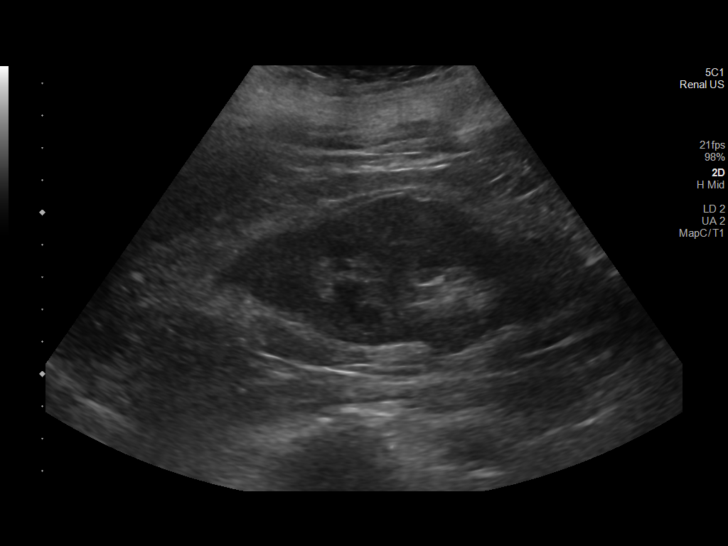
[im 24/45]
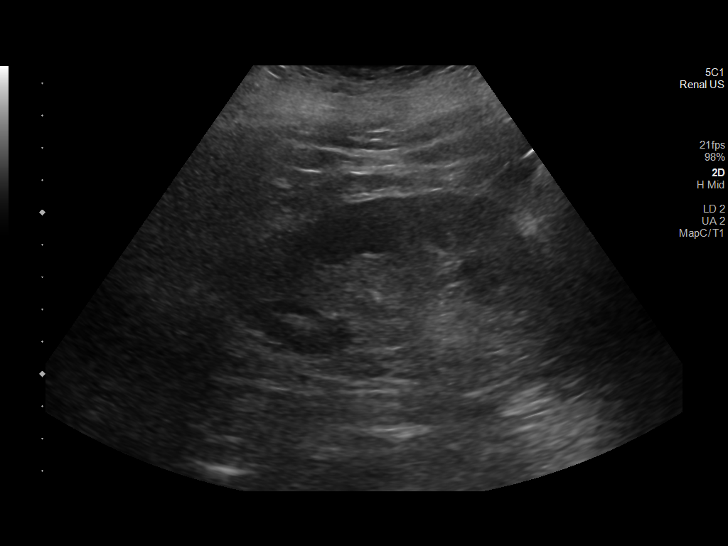
[im 28/45]
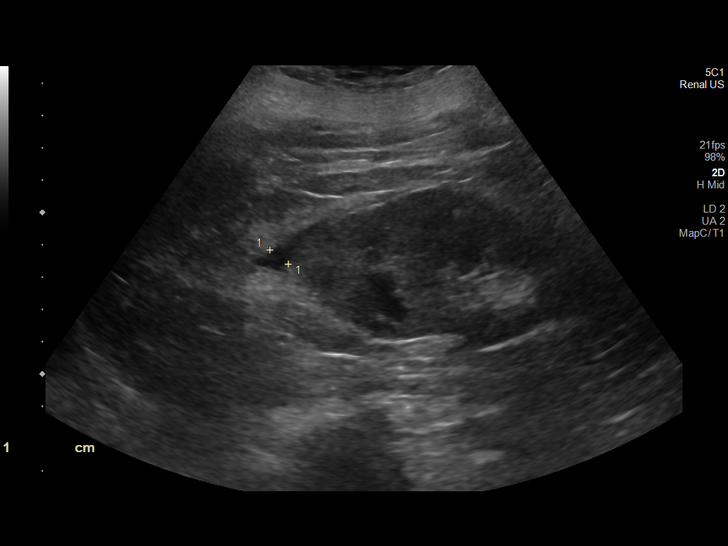
[im 30/45]
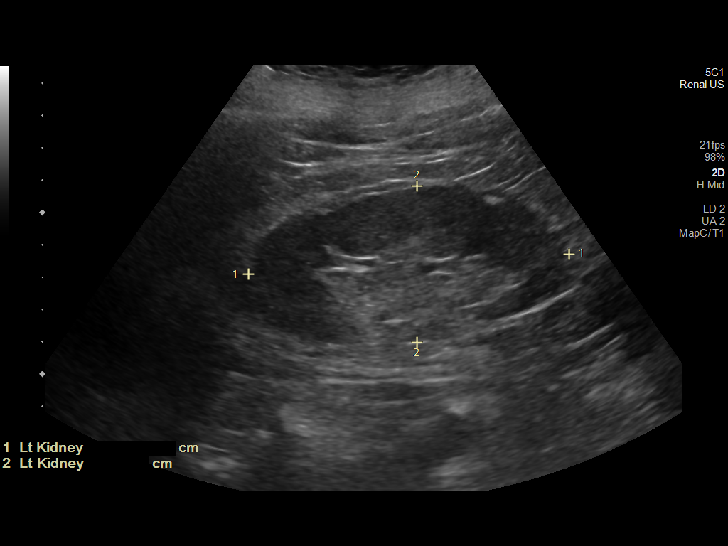
[im 34/45]
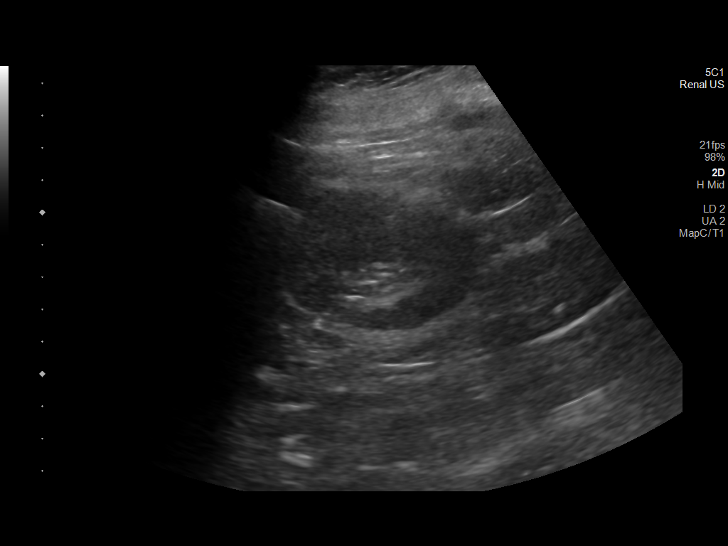
[im 37/45]
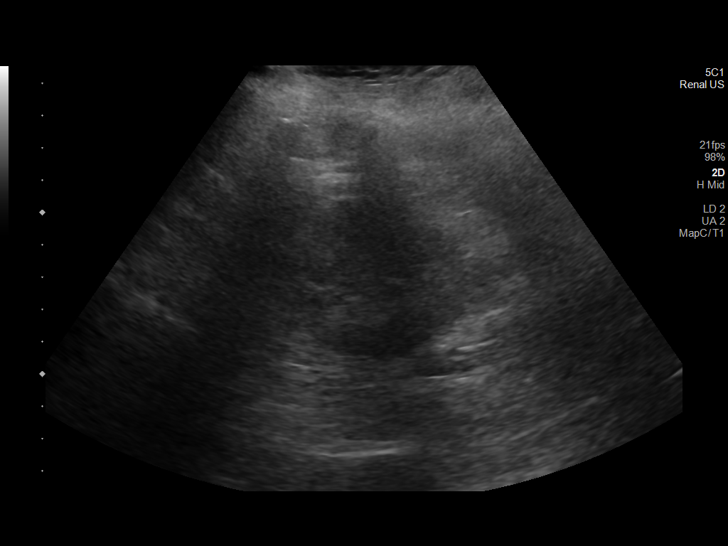
[im 41/45]
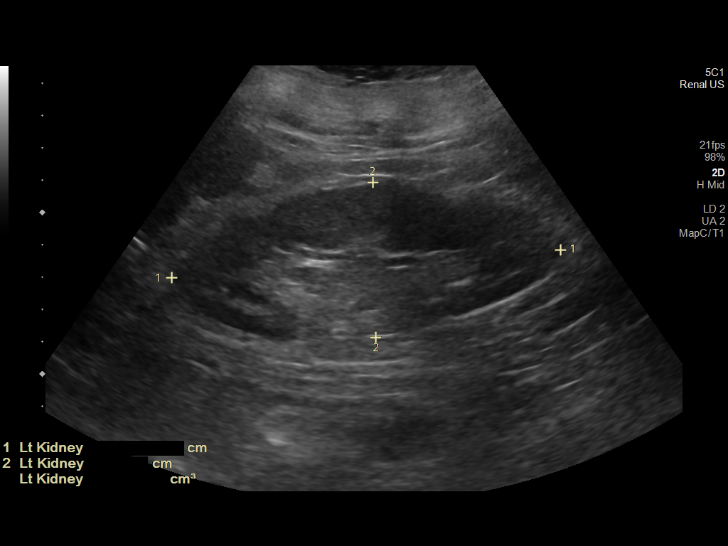
[im 45/45]
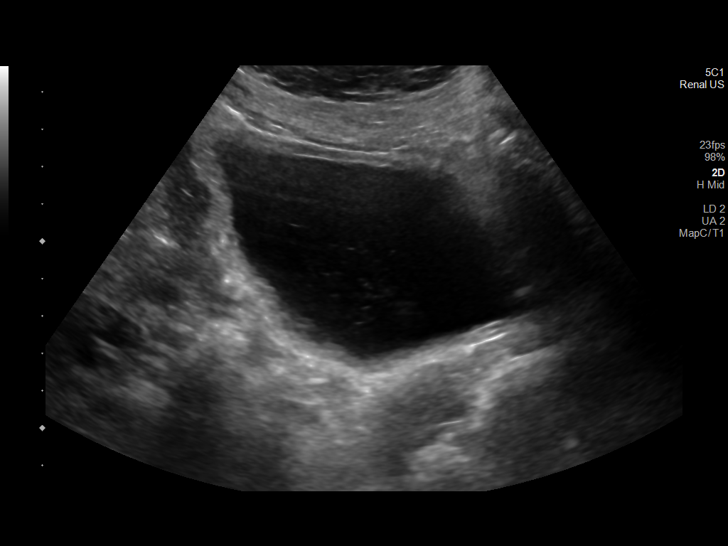

[14 of 25 positions shown; findings below may reference images not displayed]

FINDINGS: Right Kidney:

Renal measurements: 7.9 x 5 x 4.5 cm = volume: 91.96 mL. There is
increased cortical echogenicity. There is no hydronephrosis.

Left Kidney:

Renal measurements: 12.1 x 4.8 x 4.2 cm = volume: 127.10 mL. There
is increased cortical echogenicity. There is no hydronephrosis.

Bladder:

Appears normal for degree of bladder distention.

Other:

None.
IMPRESSION: There is no hydronephrosis. There is increased cortical echogenicity
in both kidneys suggesting medical renal disease.

## 2023-07-28 IMAGING — DX DG CHEST 1V PORT
1 series · 1 of 1 positions shown · non-contrast
Comparison: 03/29/2020

CLINICAL DATA: Shortness of breath

EXAM:
PORTABLE CHEST 1 VIEW

[chest ap]
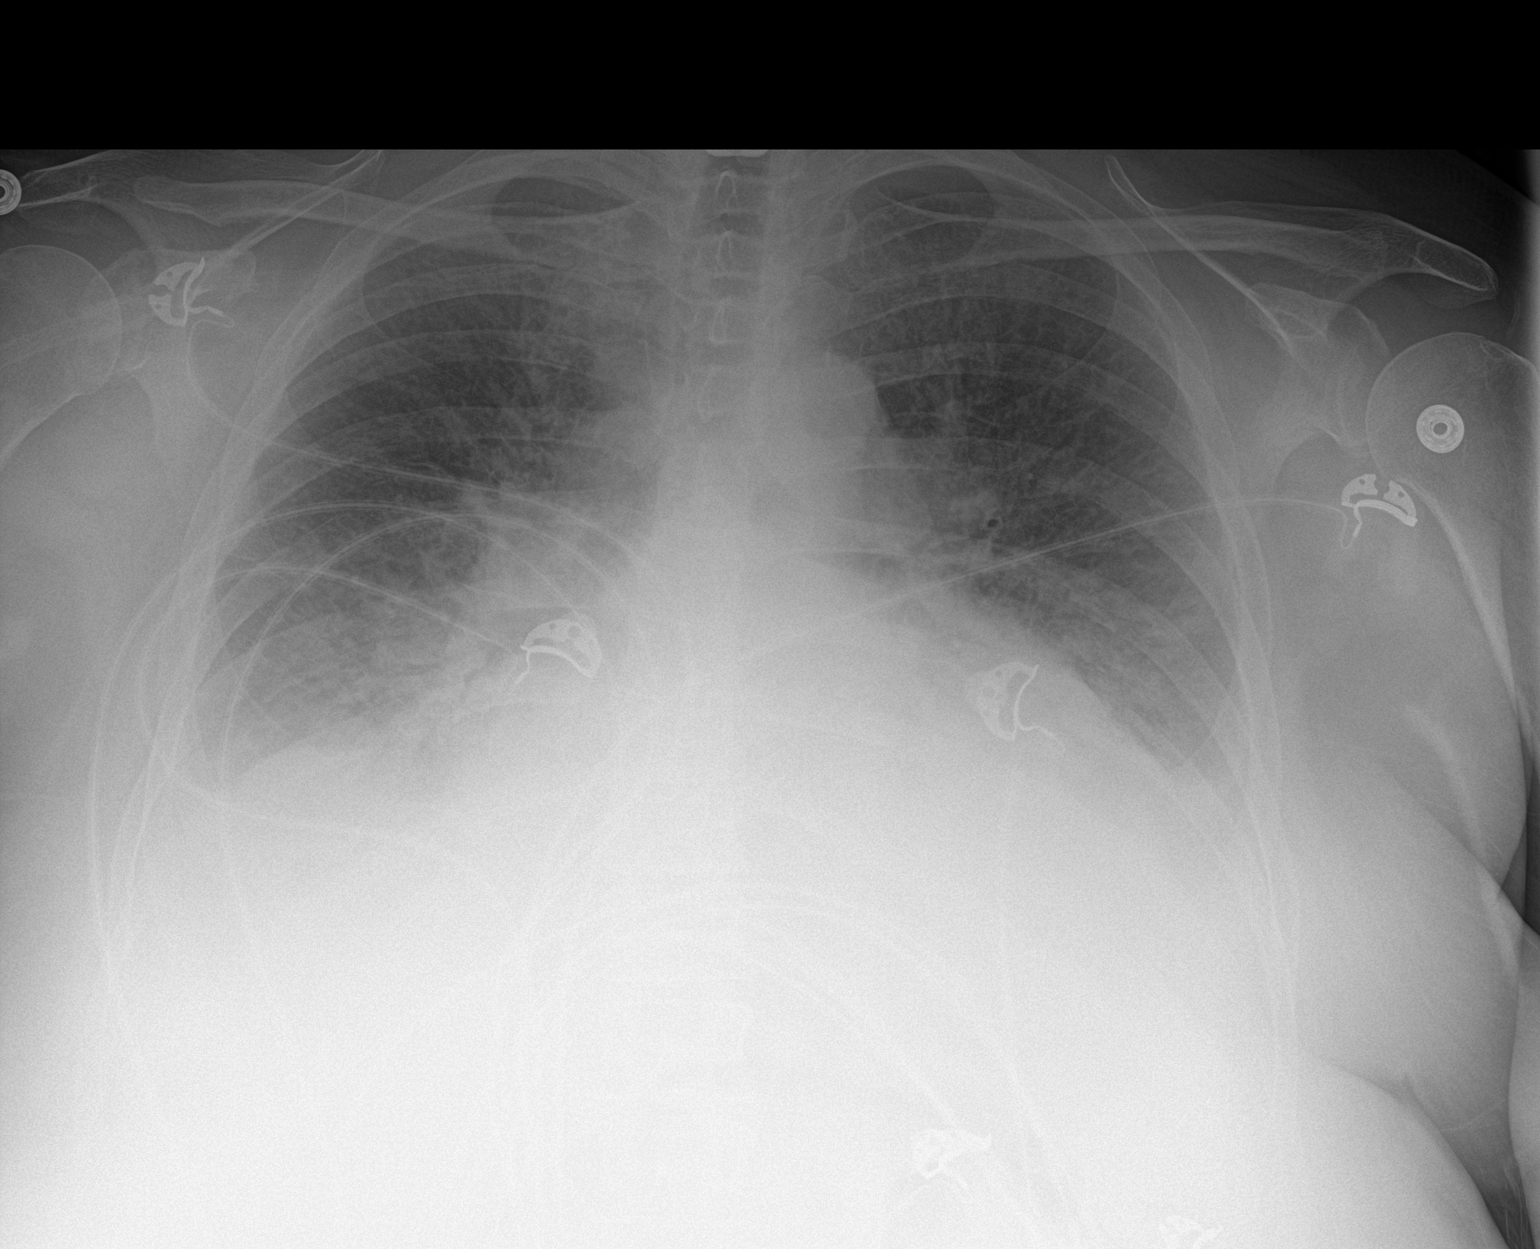

[1 of 1 positions shown; findings below may reference images not displayed]

FINDINGS: Shallow inspiration with low lung volumes. Patchy perihilar and
basilar opacities. Possible small pleural effusions. Similar
cardiomegaly.
IMPRESSION: Shallow inspiration. Patchy perihilar and basilar opacities may
reflect edema or other atelectasis/consolidation. Similar
cardiomegaly.
